# Patient Record
Sex: Female | Born: 1975 | Race: Black or African American | Smoking: Never smoker
Health system: Northeastern US, Community
[De-identification: ages and names within clinical notes are randomized; demographics above are authoritative.]

## PROBLEM LIST (undated history)

## (undated) DIAGNOSIS — J45909 Unspecified asthma, uncomplicated: Secondary | ICD-10-CM

## (undated) DIAGNOSIS — K219 Gastro-esophageal reflux disease without esophagitis: Secondary | ICD-10-CM

## (undated) DIAGNOSIS — T7840XA Allergy, unspecified, initial encounter: Secondary | ICD-10-CM

## (undated) DIAGNOSIS — R7303 Prediabetes: Secondary | ICD-10-CM

## (undated) DIAGNOSIS — I10 Essential (primary) hypertension: Secondary | ICD-10-CM

## (undated) DIAGNOSIS — I890 Lymphedema, not elsewhere classified: Secondary | ICD-10-CM

## (undated) DIAGNOSIS — F32A Depression, unspecified: Secondary | ICD-10-CM

## (undated) DIAGNOSIS — Q828 Other specified congenital malformations of skin: Secondary | ICD-10-CM

## (undated) DIAGNOSIS — F419 Anxiety disorder, unspecified: Secondary | ICD-10-CM

## (undated) DIAGNOSIS — G709 Myoneural disorder, unspecified: Secondary | ICD-10-CM

## (undated) DIAGNOSIS — M3390 Dermatopolymyositis, unspecified, organ involvement unspecified: Secondary | ICD-10-CM

## (undated) DIAGNOSIS — B029 Zoster without complications: Secondary | ICD-10-CM

## (undated) DIAGNOSIS — M3313 Other dermatomyositis without myopathy: Secondary | ICD-10-CM

## (undated) HISTORY — DX: Depression, unspecified: F32.A

## (undated) HISTORY — DX: Gastro-esophageal reflux disease without esophagitis: K21.9

## (undated) HISTORY — DX: Anxiety disorder, unspecified: F41.9

## (undated) HISTORY — DX: Allergy, unspecified, initial encounter: T78.40XA

## (undated) HISTORY — DX: Other specified congenital malformations of skin: Q82.8

## (undated) HISTORY — DX: Prediabetes: R73.03

## (undated) HISTORY — PX: WISDOM TOOTH EXTRACTION: SHX21

## (undated) HISTORY — DX: Myoneural disorder, unspecified: G70.9

## (undated) HISTORY — DX: Lymphedema, not elsewhere classified: I89.0

## (undated) HISTORY — PX: BREAST EXCISIONAL BIOPSY: SUR124

## (undated) HISTORY — PX: BREAST SURGERY: SHX581

## (undated) HISTORY — PX: BREAST LUMPECTOMY: SHX2

---

## 2010-10-10 ENCOUNTER — Inpatient Hospital Stay (HOSPITAL_BASED_OUTPATIENT_CLINIC_OR_DEPARTMENT_OTHER)
Admission: RE | Admit: 2010-10-10 | Disposition: A | Discharge status: Home / Self Care | Payer: Self-pay | Source: Emergency Department | Attending: Internal Medicine | Admitting: Internal Medicine

## 2010-10-10 ENCOUNTER — Other Ambulatory Visit (HOSPITAL_BASED_OUTPATIENT_CLINIC_OR_DEPARTMENT_OTHER): Payer: Self-pay | Admitting: Internal Medicine

## 2010-10-10 ENCOUNTER — Encounter (HOSPITAL_BASED_OUTPATIENT_CLINIC_OR_DEPARTMENT_OTHER): Payer: Self-pay

## 2010-10-10 DIAGNOSIS — M3390 Dermatopolymyositis, unspecified, organ involvement unspecified: Secondary | ICD-10-CM

## 2010-10-10 DIAGNOSIS — M3313 Other dermatomyositis without myopathy: Secondary | ICD-10-CM

## 2010-10-10 HISTORY — DX: Dermatopolymyositis, unspecified, organ involvement unspecified: M33.90

## 2010-10-10 HISTORY — DX: Zoster without complications: B02.9

## 2010-10-10 HISTORY — DX: Other dermatomyositis without myopathy: M33.13

## 2010-10-10 LAB — RBCMORPH

## 2010-10-10 LAB — C-REACTIVE PROTEIN: C-REACTIVE PROTEIN: 10.9 mg/dL (ref 0–1.8)

## 2010-10-10 LAB — CBC, PLATELET & DIFFERENTIAL
BASOPHIL %: 0.1 % (ref 0.0–2.0)
EOSINOPHIL %: 1.1 % (ref 0.0–7.0)
HEMATOCRIT: 25.9 % — ABNORMAL LOW (ref 36.0–48.0)
HEMOGLOBIN: 9 g/dl — ABNORMAL LOW (ref 12.0–16.0)
LYMPHOCYTE %: 13.5 % (ref 13.0–39.0)
MEAN CORP HGB CONC: 34.7 g/dl (ref 32.0–36.0)
MEAN CORPUSCULAR HGB: 33.9 pg — ABNORMAL HIGH (ref 27.0–33.0)
MEAN CORPUSCULAR VOL: 97.7 fl (ref 80.0–100.0)
MEAN PLATELET VOLUME: 7.4 fl (ref 6.4–10.8)
MONOCYTE %: 6.3 % (ref 1.0–12.0)
NEUTROPHIL %: 79 % (ref 46.0–79.0)
PLATELET COUNT: 328 10*3/uL (ref 150–400)
RBC DISTRIBUTION WIDTH: 13.9 % (ref 11.5–14.3)
RED BLOOD CELL COUNT: 2.65 M/uL — CL (ref 4.50–5.10)
WHITE BLOOD CELL COUNT: 6 10*3/uL (ref 4.0–10.8)

## 2010-10-10 LAB — CK INDEX: CK INDEX: 2.2 %

## 2010-10-10 LAB — COMPREHENSIVE METABOLIC PANEL
ALANINE AMINOTRANSFERASE: 41 IU/L — ABNORMAL HIGH (ref 7–35)
ALBUMIN: 3.4 g/dl (ref 3.4–4.8)
ALKALINE PHOSPHATASE: 73 IU/L (ref 25–106)
ANION GAP: 8 mmol/L (ref 2–25)
ASPARTATE AMINOTRANSFERASE: 72 IU/L — ABNORMAL HIGH (ref 8–34)
BILIRUBIN TOTAL: 0.9 mg/dl (ref 0.2–1.1)
BUN (UREA NITROGEN): 11 mg/dl (ref 6–20)
CALCIUM: 9.2 mg/dl (ref 8.6–10.3)
CARBON DIOXIDE: 29 mmol/L (ref 22–32)
CHLORIDE: 102 mmol/L (ref 101–111)
CREATININE: 0.4 mg/dl (ref 0.4–1.2)
ESTIMATED GLOMERULAR FILT RATE: >60 mL/min (ref >60)
Glucose Random: 99 mg/dl (ref 74–160)
POTASSIUM: 3.5 mmol/L (ref 3.5–5.1)
SODIUM: 139 mmol/L (ref 135–144)
TOTAL PROTEIN: 6.9 g/dl (ref 5.9–7.5)

## 2010-10-10 LAB — EMERGENCY ROOM NOTE

## 2010-10-10 LAB — CONSULTATION

## 2010-10-10 LAB — URINALYSIS
BILIRUBIN, URINE: NEGATIVE
GLUCOSE, URINE: NEGATIVE MG/DL
KETONE, URINE: NEGATIVE MG/DL
LEUKOCYTE ESTERASE: NEGATIVE
NITRITE, URINE: NEGATIVE
OCCULT BLOOD, URINE: NEGATIVE
PH URINE: 6 (ref 5.0–8.0)
SPECIFIC GRAVITY URINE: 1.025 (ref 1.003–1.035)

## 2010-10-10 LAB — CHG ASSAY OF PHOSPHORUS INORGANIC: PHOSPHORUS: 3.9 mg/dl (ref 2.7–4.7)

## 2010-10-10 LAB — CHG CREATINE KINASE TOTAL: CREATINE KINASE TOTAL: 228 IU/L (ref 21–215)

## 2010-10-10 LAB — CHG ASSAY OF MAGNESIUM: MAGNESIUM: 2.1 mg/dl (ref 1.8–2.6)

## 2010-10-10 LAB — CHG SEDIMENTATION RATE RBC NON-AUTOMATED: RBC SEDIMENTATION RATE: 1 MM/HR (ref 0–15)

## 2010-10-10 LAB — PROTHROMBIN TIME
INR: 1.1 — ABNORMAL LOW (ref 2.0–3.5)
PROTHROMBIN TIME: 11.9 SECONDS — ABNORMAL HIGH (ref 9.5–11.5)

## 2010-10-10 LAB — CREATINE, MB FRACTION: CKMB: 5.1 ng/mL (ref 0.0–4.0)

## 2010-10-10 LAB — APTT: APTT: 31.7 SECONDS (ref 23.7–33.3)

## 2010-10-10 LAB — HOLD PURPLE TOP TUBE

## 2010-10-10 LAB — WBC DIFFERENTIAL SCAN

## 2010-10-10 LAB — HOLD GREEN TOP TUBE

## 2010-10-10 MED ORDER — MULTIVITAMIN PO TAB
1.0000 | ORAL_TABLET | Freq: Every day | ORAL | Status: DC
Start: 2010-10-10 — End: 2010-10-11
  Administered 2010-10-11: 1 via ORAL
  Filled 2010-10-10: qty 1

## 2010-10-10 MED ORDER — TRAZODONE HCL 50 MG PO TABS
25.0000 mg | ORAL_TABLET | Freq: Every evening | ORAL | Status: DC | PRN
Start: 2010-10-10 — End: 2010-10-11
  Administered 2010-10-10: 25 mg via ORAL
  Filled 2010-10-10: qty 1

## 2010-10-10 MED ORDER — FOLIC ACID 1 MG PO TABS
1.0000 mg | ORAL_TABLET | Freq: Every day | ORAL | Status: DC
Start: 2010-10-10 — End: 2010-10-11
  Administered 2010-10-11: 1 mg via ORAL
  Filled 2010-10-10: qty 1

## 2010-10-10 MED ORDER — PREDNISONE 20 MG PO TABS
40.0000 mg | ORAL_TABLET | Freq: Every day | ORAL | Status: DC
Start: 2010-10-10 — End: 2010-10-11
  Administered 2010-10-10 – 2010-10-11 (×2): 40 mg via ORAL
  Filled 2010-10-10 (×2): qty 2

## 2010-10-10 MED ORDER — HYDROXYCHLOROQUINE SULFATE 200 MG PO TABS
400.0000 mg | ORAL_TABLET | Freq: Every day | ORAL | Status: DC
Start: 2010-10-10 — End: 2010-10-11
  Administered 2010-10-11: 400 mg via ORAL
  Filled 2010-10-10: qty 2

## 2010-10-10 MED ORDER — HEPARIN SODIUM (PORCINE) 5000 UNIT/ML IJ SOLN
5000.0000 [IU] | Freq: Three times a day (TID) | INTRAMUSCULAR | Status: DC
Start: 2010-10-10 — End: 2010-10-11
  Administered 2010-10-10 – 2010-10-11 (×2): 5000 [IU] via SUBCUTANEOUS
  Filled 2010-10-10 (×3): qty 1

## 2010-10-10 MED ORDER — ACETAMINOPHEN 325 MG PO TABS
650.0000 mg | ORAL_TABLET | ORAL | Status: DC | PRN
Start: 2010-10-10 — End: 2010-10-11
  Administered 2010-10-11: 650 mg via ORAL
  Filled 2010-10-10: qty 2

## 2010-10-10 NOTE — ED Notes (Signed)
Fever since last Thursday up to 103, itchy bumps on the skin intermittent.

## 2010-10-11 LAB — CBC WITH PLATELET
HEMATOCRIT: 25.9 % — ABNORMAL LOW (ref 36.0–48.0)
HEMOGLOBIN: 8.6 g/dl — ABNORMAL LOW (ref 12.0–16.0)
MEAN CORP HGB CONC: 33 g/dl (ref 32.0–36.0)
MEAN CORPUSCULAR HGB: 32.7 pg (ref 27.0–33.0)
MEAN CORPUSCULAR VOL: 98.9 fl (ref 80.0–100.0)
MEAN PLATELET VOLUME: 7.7 fl (ref 6.4–10.8)
PLATELET COUNT: 313 10*3/uL (ref 150–400)
RBC DISTRIBUTION WIDTH: 13.8 % (ref 11.5–14.3)
RED BLOOD CELL COUNT: 2.62 M/uL — CL (ref 4.50–5.10)
WHITE BLOOD CELL COUNT: 6.8 10*3/uL (ref 4.0–10.8)

## 2010-10-11 LAB — BASIC METABOLIC PANEL
ANION GAP: 7 mmol/L (ref 2–25)
BUN (UREA NITROGEN): 6 mg/dl (ref 6–20)
CALCIUM: 8.6 mg/dl (ref 8.6–10.3)
CARBON DIOXIDE: 27 mmol/L (ref 22–32)
CHLORIDE: 105 mmol/L (ref 101–111)
CREATININE: 0.4 mg/dl (ref 0.4–1.2)
ESTIMATED GLOMERULAR FILT RATE: >60 mL/min (ref >60)
Glucose Random: 127 mg/dl (ref 74–160)
POTASSIUM: 4.2 mmol/L (ref 3.5–5.1)
SODIUM: 139 mmol/L (ref 135–144)

## 2010-10-11 LAB — CHG ASSAY OF PHOSPHORUS INORGANIC: PHOSPHORUS: 3.3 mg/dl (ref 2.7–4.7)

## 2010-10-11 LAB — CHG ASSAY OF MAGNESIUM: MAGNESIUM: 2.2 mg/dl (ref 1.8–2.6)

## 2010-10-11 LAB — XR CHEST 2 VIEWS

## 2010-10-15 LAB — CHG RHEUMATOID FACTOR QUALITATIVE: RHEUMATOID FACTOR: NEGATIVE

## 2010-10-15 LAB — VITAMIN B12: VITAMIN B12: 817 pg/mL (ref 180–914)

## 2010-10-15 LAB — VITAMIN D,25 HYDROXY: VITAMIN D,25 HYDROXY: 31.8 ng/ml (ref 30.0–100.0)

## 2010-10-15 LAB — CHG ASSAY OF FOLIC ACID SERUM: FOLATE: >20 ng/mL (ref 3.0–?)

## 2010-10-16 ENCOUNTER — Ambulatory Visit (HOSPITAL_BASED_OUTPATIENT_CLINIC_OR_DEPARTMENT_OTHER): Payer: PRIVATE HEALTH INSURANCE | Admitting: Internal Medicine

## 2010-10-16 VITALS — BP 133/85 | HR 71 | Ht 68.5 in | Wt 160.0 lb

## 2010-10-16 DIAGNOSIS — M3313 Other dermatomyositis without myopathy: Secondary | ICD-10-CM

## 2010-10-16 DIAGNOSIS — L0390 Cellulitis, unspecified: Secondary | ICD-10-CM | POA: Insufficient documentation

## 2010-10-16 DIAGNOSIS — R809 Proteinuria, unspecified: Secondary | ICD-10-CM | POA: Insufficient documentation

## 2010-10-16 DIAGNOSIS — M349 Systemic sclerosis, unspecified: Secondary | ICD-10-CM

## 2010-10-16 DIAGNOSIS — M3390 Dermatopolymyositis, unspecified, organ involvement unspecified: Secondary | ICD-10-CM | POA: Insufficient documentation

## 2010-10-16 DIAGNOSIS — B029 Zoster without complications: Secondary | ICD-10-CM | POA: Insufficient documentation

## 2010-10-16 DIAGNOSIS — N63 Unspecified lump in unspecified breast: Secondary | ICD-10-CM | POA: Insufficient documentation

## 2010-10-16 DIAGNOSIS — J302 Other seasonal allergic rhinitis: Secondary | ICD-10-CM | POA: Insufficient documentation

## 2010-10-16 LAB — LUPUS ANTICOAGULANT PANEL
DILUTE RUSSELL'S VIPER VENOM: 44 s (ref 0.0–50.1)
PTT-LA LUPUS ANTICOAGUL SCREEN: 43.5 s (ref 0.0–50.0)

## 2010-10-16 LAB — ANTICARDIOLIPIN ANTIBODIES
ANTICARDIOLIPIN ANTIBODY IGG: <9 GPL U/mL (ref 0–14)
ANTICARDIOLIPIN ANTIBODY IGM: <9 MPL U/mL (ref 0–12)

## 2010-10-16 LAB — C4 COMPLEMENT: C4 COMPLEMENT: 44 — AB (ref 9–36)

## 2010-10-16 LAB — BLOOD CULTURE ADULT
AEROBIC BOTTLE, BLOOD CULTURE: NO GROWTH
AEROBIC BOTTLE, BLOOD CULTURE: NO GROWTH
ANAEROBIC BTL, BLOOD CULTURE: NO GROWTH
ANAEROBIC BTL, BLOOD CULTURE: NO GROWTH

## 2010-10-16 LAB — CH 50 COMPLEMENT TOTAL: CH 50 COMPLEMENT TOTAL: >60 U/mL — AB (ref 22–60)

## 2010-10-16 LAB — ANTI-DNA AB DOUBLE STRAND: ANTI-DNA AB DOUBLE STRAND: <1 IU/mL (ref 0–9)

## 2010-10-16 LAB — C3 COMPLEMENT: C3 COMPLEMENT: 184 — AB (ref 90–180)

## 2010-10-16 LAB — BETA-2 GLYCOPROTEIN ABS
BETA 2 GLYCOPROTEIN I AB IGA: <9 (ref 0–20)
BETA 2 GLYCOPROTEIN I AB IGG: <9 (ref 0–20)
BETA 2 GLYCOPROTEIN I AB IGM: <9 (ref 0–20)

## 2010-10-16 LAB — SJOGREN'S ANTIBODY (SS-A,SS-B)
SS-A (RO): <0.2 AI (ref 0.0–0.9)
SS-B (LA): <0.2 AI (ref 0.0–0.9)

## 2010-10-16 LAB — ANTI EXTRACTABLE NUCLEAR AB
ANTI-RNP: 0.4 AI (ref 0.0–0.9)
ANTI-SM: <0.2 AI (ref 0.0–0.9)

## 2010-10-16 LAB — CCP ANTIBODIES IGG/IGA: CCP ANTIBODIES IGG/IGA: 3 units (ref 0–19)

## 2010-10-16 MED ORDER — MYCOPHENOLATE MOFETIL 500 MG PO TABS
1000.0000 mg | ORAL_TABLET | Freq: Two times a day (BID) | ORAL | Status: DC
Start: 2010-10-16 — End: 2010-12-23

## 2010-10-16 MED ORDER — FOLIC ACID 1 MG PO TABS
1.0000 mg | ORAL_TABLET | Freq: Every day | ORAL | Status: DC
Start: 2010-10-16 — End: 2011-05-04

## 2010-10-16 MED ORDER — MUPIROCIN 2 % EX OINT
TOPICAL_OINTMENT | Freq: Three times a day (TID) | CUTANEOUS | Status: AC
Start: 2010-10-16 — End: 2011-10-16

## 2010-10-16 MED ORDER — CALCIUM CARBONATE-VITAMIN D 600-400 MG-UNIT PO TABS
1.00 | ORAL_TABLET | Freq: Two times a day (BID) | ORAL | Status: AC
Start: 2010-10-16 — End: 2011-10-16

## 2010-10-16 MED ORDER — VITAMIN D 1000 UNITS PO CAPS
1.00 | ORAL_CAPSULE | Freq: Every day | ORAL | Status: AC
Start: 2010-10-16 — End: 2011-10-16

## 2010-10-16 MED ORDER — HYDROXYCHLOROQUINE SULFATE 200 MG PO TABS
400.0000 mg | ORAL_TABLET | Freq: Every day | ORAL | Status: AC
Start: 2010-10-16 — End: 2011-10-16

## 2010-10-16 MED ORDER — MYCOPHENOLATE MOFETIL 500 MG PO TABS
1000.0000 mg | ORAL_TABLET | Freq: Two times a day (BID) | ORAL | Status: DC
Start: 2010-10-16 — End: 2010-10-16

## 2010-10-16 MED ORDER — METHOTREXATE 2.5 MG PO TABS
25.0000 mg | ORAL_TABLET | ORAL | Status: DC
Start: 2010-10-16 — End: 2011-03-16

## 2010-10-16 MED ORDER — ONE-DAILY MULTI VITAMINS PO TABS
1.00 | ORAL_TABLET | Freq: Every day | ORAL | Status: AC
Start: 2010-10-16 — End: 2011-10-16

## 2010-10-16 MED ORDER — PREDNISONE 10 MG PO TABS
40.0000 mg | ORAL_TABLET | Freq: Every day | ORAL | Status: DC
Start: 2010-10-16 — End: 2010-12-23

## 2010-10-16 NOTE — Patient Instructions (Addendum)
Stay on 40mg  2 more weeks.  Then decrease to 30mg .  30mg  for 1 month.  Then try to decrease to 20mg .      Increase the Cellcept/mycophenolate to 3 tablets a day for 1 week then to 2 tablets twice a day- start on 11/06/10.

## 2010-10-16 NOTE — Progress Notes (Signed)
The primary progress note for this visit has been dictated through E-Scription. It can be viewed as an attachment to this encounter or through Chart Review under the Other Tab as an Rheumatology Office Note.      Review of Systems: Constitutional, Eyes, ENT/Mouth, Cardiovascular, Respiratory, GI, GU, Neuro, Psych, Heme/Lymph, Skin, Musculoskeletal was reviewed and is NEGATIVE except for what is dictated in the note.

## 2010-10-23 LAB — BLOOD CULTURE ADULT
AEROBIC BOTTLE, BLOOD CULTURE: NO GROWTH
ANAEROBIC BTL, BLOOD CULTURE: NO GROWTH

## 2010-10-28 ENCOUNTER — Telehealth (HOSPITAL_BASED_OUTPATIENT_CLINIC_OR_DEPARTMENT_OTHER): Payer: Self-pay

## 2010-10-28 NOTE — Telephone Encounter (Signed)
Spoke to pt she is not going to increase cellcept until end of month when she can come in for labs.  Pt is on 40mg  of prednisone if not better by Monday to call us to discuss taper

## 2010-10-28 NOTE — Telephone Encounter (Signed)
Message copied by Kenyon Ana ANN on Tue Oct 28, 2010  1:26 PM  ------       Message from: Cecilio Asper       Created: Tue Oct 28, 2010 12:42 PM         Can stay on prednisone but still increase cellcept.       ----- Message -----          From: Norman Herrlich          Sent: 10/28/2010  11:39 AM            To: Lucretia Roers              Patient called today.  She is suppose to start decreasing the prednisone, however, the itchy bumps are back but not the fever.  She would like to stay on the same does for a couple of weeks before she starts decreasing the dose.  Also she is suppose to increase the Cellcept at the end of the month, if she does not lower the prednisone would that change the increase of Cellcept?              She can be reached at [phone].   kw

## 2010-10-29 LAB — RHEUMATOLOGY OFFICE NOTE

## 2010-11-03 ENCOUNTER — Telehealth (HOSPITAL_BASED_OUTPATIENT_CLINIC_OR_DEPARTMENT_OTHER): Payer: Self-pay

## 2010-11-03 NOTE — Telephone Encounter (Signed)
Called pt back.   Per pt has had no fever since on prednisone.  Pt is on 40mg  of prednisone bumps come and go but has not had new ones.  Pt is going to decrease to 30mg  for a few weeks and if is ok and fever free will try to decrease to 20mg .   Pt has cellcept increasing taper at home and will follow Dr. Leodis Liverpool instructions and will get labs done by PCP in Feb.

## 2010-11-05 ENCOUNTER — Ambulatory Visit (HOSPITAL_BASED_OUTPATIENT_CLINIC_OR_DEPARTMENT_OTHER): Payer: Self-pay | Admitting: Internal Medicine

## 2010-11-05 DIAGNOSIS — M349 Systemic sclerosis, unspecified: Secondary | ICD-10-CM

## 2010-11-07 LAB — DISCHARGE SUMMARY

## 2010-11-10 ENCOUNTER — Encounter (HOSPITAL_BASED_OUTPATIENT_CLINIC_OR_DEPARTMENT_OTHER): Payer: Self-pay | Admitting: Internal Medicine

## 2010-11-27 ENCOUNTER — Encounter (HOSPITAL_BASED_OUTPATIENT_CLINIC_OR_DEPARTMENT_OTHER): Payer: Self-pay | Admitting: Internal Medicine

## 2010-11-27 ENCOUNTER — Other Ambulatory Visit: Payer: Self-pay | Admitting: Internal Medicine

## 2010-11-27 ENCOUNTER — Ambulatory Visit (HOSPITAL_BASED_OUTPATIENT_CLINIC_OR_DEPARTMENT_OTHER): Payer: PRIVATE HEALTH INSURANCE | Admitting: Internal Medicine

## 2010-11-27 VITALS — BP 116/64 | Temp 97.9°F | Ht 68.5 in | Wt 151.2 lb

## 2010-11-27 DIAGNOSIS — M3390 Dermatopolymyositis, unspecified, organ involvement unspecified: Secondary | ICD-10-CM

## 2010-11-27 DIAGNOSIS — R634 Abnormal weight loss: Secondary | ICD-10-CM

## 2010-11-27 DIAGNOSIS — M3313 Other dermatomyositis without myopathy: Secondary | ICD-10-CM

## 2010-11-27 DIAGNOSIS — N63 Unspecified lump in unspecified breast: Secondary | ICD-10-CM

## 2010-11-27 DIAGNOSIS — Z23 Encounter for immunization: Secondary | ICD-10-CM

## 2010-11-27 LAB — CBC, PLATELET & DIFFERENTIAL
BASOPHIL %: 0 % (ref 0.0–2.0)
EOSINOPHIL %: 0.1 % (ref 0.0–7.0)
HEMATOCRIT: 35.5 % — ABNORMAL LOW (ref 36.0–48.0)
HEMOGLOBIN: 12.1 g/dl (ref 12.0–16.0)
LYMPHOCYTE %: 5.3 % — ABNORMAL LOW (ref 13.0–39.0)
MEAN CORP HGB CONC: 34.1 g/dl (ref 32.0–36.0)
MEAN CORPUSCULAR HGB: 33.1 pg — ABNORMAL HIGH (ref 27.0–33.0)
MEAN CORPUSCULAR VOL: 97.1 fl (ref 80.0–100.0)
MEAN PLATELET VOLUME: 8.2 fl (ref 6.4–10.8)
MONOCYTE %: 2.9 % (ref 1.0–12.0)
NEUTROPHIL %: 91.7 % (ref 46.0–79.0)
PLATELET COUNT: 268 10*3/uL (ref 150–400)
RBC DISTRIBUTION WIDTH: 15.4 % — ABNORMAL HIGH (ref 11.5–14.3)
RED BLOOD CELL COUNT: 3.66 M/uL — ABNORMAL LOW (ref 4.50–5.10)
WHITE BLOOD CELL COUNT: 7.4 10*3/uL (ref 4.0–10.8)

## 2010-11-27 LAB — COMPREHENSIVE METABOLIC PANEL
ALANINE AMINOTRANSFERASE: 20 IU/L (ref 7–35)
ALBUMIN: 4 g/dl (ref 3.4–4.8)
ALKALINE PHOSPHATASE: 30 IU/L (ref 25–106)
ANION GAP: 9 mmol/L (ref 2–25)
ASPARTATE AMINOTRANSFERASE: 26 IU/L (ref 8–34)
BILIRUBIN TOTAL: 0.7 mg/dl (ref 0.2–1.1)
BUN (UREA NITROGEN): 6 mg/dl (ref 6–20)
CALCIUM: 9.3 mg/dl (ref 8.6–10.3)
CARBON DIOXIDE: 26 mmol/L (ref 22–32)
CHLORIDE: 103 mmol/L (ref 101–111)
CREATININE: 0.5 mg/dl (ref 0.4–1.2)
ESTIMATED GLOMERULAR FILT RATE: >60 mL/min (ref >60)
Glucose Random: 109 mg/dl (ref 74–160)
POTASSIUM: 4.3 mmol/L (ref 3.5–5.1)
SODIUM: 138 mmol/L (ref 135–144)
TOTAL PROTEIN: 7.3 g/dl (ref 5.9–7.5)

## 2010-11-27 LAB — WBC DIFFERENTIAL SCAN

## 2010-11-27 NOTE — Progress Notes (Signed)
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 Getting care in rheum clinic. Recent flair and rx'd with prednisone. Still on 30 and undergoing slow taper  O:BP 116/64  Temp(Src) 97.9 F (36.6 C) (Oral)  Ht 5' 8.5" (1.74 m)  Wt 151 lb 3.2 oz (68.584 kg)  BMI 22.66 kg/m2  Not cushingoid  tightening of skin   no hair,  vitiligo  CV Click on exam  A/P: dermatomyositis: f/u at rheum   will investigate thing that need to be looking into over time  H/o l breast mass inflammatory path  Please see residents note for further details.

## 2010-11-27 NOTE — Progress Notes (Signed)
S: Julie Freeman is a 35 year old female with a history of Dermatomyositis. 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 Has more flexible hours now to attend medical appointments, trying to find work through [facility].     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 Lenor Derrick). ?Vitiligo, discoid lupus. Has established care with Dr. Lenor Derrick. Reports overall feeling much better since starting prednisone after hosp admission around new year's. Now tapered to 30mg  from 40mg  initially. Denies recurrent fevers.    Med list reviewed, reports she is taking all of them as prescribed.     Comes today with a letter from [facility] to be filled out in hopes that she can receive employment help on the basis of her disability. Feels as though she had experienced some discrimination in the work place because she cannot work as quickly as counterparts. Really would like to work. Does better with sedentary work.   Has lost 9 pounds since Jan, believes 2/2 to a host of stressors, change in eating habits.     Has 31 year old son, pervasive developmental disorder. Son socialable but "learns differently" DM type 1, peanut allergy.     Reports that she has an upcoming appt for PFT's. Enocuraged to call to be sure as I cannot see evidence in EPic.    O: BP 116/64  Temp(Src) 97.9 F (36.6 C) (Oral)  Ht 5' 8.5" (1.74 m)  Wt 151 lb 3.2 oz (68.584 kg)  BMI 22.66 kg/m2          Physical Exam   Constitutional: Vital signs are normal.   Cardiovascular: Normal rate, regular rhythm and normal heart sounds.         ?end-systolic click/s3   Pulmonary/Chest: Effort normal and breath sounds normal. No respiratory distress. She has no wheezes. Right breast exhibits no inverted nipple, no mass and no nipple discharge. Left breast exhibits no inverted nipple, no mass and no nipple discharge. Breasts are symmetrical.        Well-healed, inch-long incision over left lateral  breast.    Abdominal: Soft. She exhibits no distension. No tenderness.   Lymphadenopathy:        Right axillary: No pectoral and no lateral adenopathy present.        Left axillary: No pectoral and no lateral adenopathy present.  Skin: She is not diaphoretic.        Discoloration/depigmentation of skin over face, chest, hands. Periorbital discoloration of skin b/l.      A/P:  710.3 Dermatomyositis  (primary encounter diagnosis)  Comment: Sent off new another request for medical records from [place] [facility]. As per Dr Hedwig Morton notes, needs surveillance ECHO, Chest CT, PFT's. Patient reports she got ECHO recently in [place]. As per pt, PFT appt next week.   Plan: To see Rheum in 3 weeks. Await records, will order above surveillance tests as needed. Will complete [facility] Paperwork     611.29F Breast mass  Comment: Lupectomy 2007. path from [facility] records provided from patient today, no evidence of malignancy. Patient unclear about what mammo interval she should observe ?role for ultrasound, other imaging modalities given <1 years of age? Exam benign.   Plan: F/U [place] records, review scanned records for recs. Consider breast center referral.     V04.81 Flu Vaccine Given  Plan: IMMUNIZATION ADMIN SINGLE, RN, INFLUENZA VIRUS         VACCINE SPLIT VIRUS 3/> YRS IM  783.21Q Weight loss  Comment: 9 lbs in 6 weeks. Pt believes related to stress, change in eating habits, reports she has had gross fluctuations in weight over many years  Plan: continue to follow, consider occult malignancy vs. Rheum disease vs. Volatile social/nutrional status.

## 2010-11-27 NOTE — Progress Notes (Signed)
Patient received Flu vax shot into left deltoid. See immunization/Injection module or chart review for date of publication and additional information.  Pt. tolerated shot well. VIS sheet given to pt. Pt has no known allergies that would prohibit receiving this immunization.

## 2010-11-27 NOTE — Progress Notes (Signed)
Body mass index is 22.66 kg/(m^2).    Patient stated that she feels safe at home.

## 2010-12-02 ENCOUNTER — Encounter (HOSPITAL_BASED_OUTPATIENT_CLINIC_OR_DEPARTMENT_OTHER): Payer: Self-pay | Admitting: Internal Medicine

## 2010-12-02 ENCOUNTER — Telehealth (HOSPITAL_BASED_OUTPATIENT_CLINIC_OR_DEPARTMENT_OTHER): Payer: Self-pay | Admitting: Internal Medicine

## 2010-12-02 NOTE — Telephone Encounter (Signed)
Called pt to let her know that I was looking at [facility] form for her but feel it would be most appropriately completed by Dr. Lenor Derrick as several of the questions regarding prognosis and treatment plan for her dermatomyositis required detailed responses and understanding of her disease process. She confirms that she does not need the form for approx one month and she is happy to have the form passed on.

## 2010-12-11 LAB — PULMONARY FUNCTION TEST

## 2010-12-18 ENCOUNTER — Ambulatory Visit (HOSPITAL_BASED_OUTPATIENT_CLINIC_OR_DEPARTMENT_OTHER): Payer: PRIVATE HEALTH INSURANCE | Admitting: Internal Medicine

## 2010-12-23 ENCOUNTER — Ambulatory Visit (HOSPITAL_BASED_OUTPATIENT_CLINIC_OR_DEPARTMENT_OTHER): Payer: PRIVATE HEALTH INSURANCE | Admitting: Internal Medicine

## 2010-12-23 VITALS — BP 122/69 | HR 78 | Wt 153.0 lb

## 2010-12-23 DIAGNOSIS — M3390 Dermatopolymyositis, unspecified, organ involvement unspecified: Secondary | ICD-10-CM

## 2010-12-23 DIAGNOSIS — Z79899 Other long term (current) drug therapy: Secondary | ICD-10-CM

## 2010-12-23 DIAGNOSIS — M3313 Other dermatomyositis without myopathy: Secondary | ICD-10-CM

## 2010-12-23 DIAGNOSIS — M349 Systemic sclerosis, unspecified: Secondary | ICD-10-CM

## 2010-12-23 LAB — CBC, PLATELET & DIFFERENTIAL
BASOPHIL %: 0 % (ref 0.0–2.0)
EOSINOPHIL %: 0.1 % (ref 0.0–7.0)
HEMATOCRIT: 35 % — ABNORMAL LOW (ref 36.0–48.0)
HEMOGLOBIN: 11.9 g/dl — ABNORMAL LOW (ref 12.0–16.0)
LYMPHOCYTE %: 5.8 % — ABNORMAL LOW (ref 13.0–39.0)
MEAN CORP HGB CONC: 34.1 g/dl (ref 32.0–36.0)
MEAN CORPUSCULAR HGB: 33.8 pg — ABNORMAL HIGH (ref 27.0–33.0)
MEAN CORPUSCULAR VOL: 99.3 fl (ref 80.0–100.0)
MEAN PLATELET VOLUME: 8.5 fl (ref 6.4–10.8)
MONOCYTE %: 4.5 % (ref 1.0–12.0)
NEUTROPHIL %: 89.6 % — ABNORMAL HIGH (ref 46.0–79.0)
PLATELET COUNT: 225 10*3/uL (ref 150–400)
RBC DISTRIBUTION WIDTH: 15.9 % — ABNORMAL HIGH (ref 11.5–14.3)
RED BLOOD CELL COUNT: 3.53 M/uL — ABNORMAL LOW (ref 4.50–5.10)
WHITE BLOOD CELL COUNT: 9.8 10*3/uL (ref 4.0–10.8)

## 2010-12-23 LAB — WBC DIFFERENTIAL SCAN

## 2010-12-23 MED ORDER — PREDNISONE 2.5 MG PO TABS
2.5000 mg | ORAL_TABLET | Freq: Every day | ORAL | Status: DC
Start: 2010-12-23 — End: 2011-11-10

## 2010-12-23 MED ORDER — MYCOPHENOLATE MOFETIL 500 MG PO TABS
1500.0000 mg | ORAL_TABLET | Freq: Two times a day (BID) | ORAL | Status: DC
Start: 2010-12-23 — End: 2011-05-04

## 2010-12-23 MED ORDER — PREDNISONE 5 MG PO TABS
15.0000 mg | ORAL_TABLET | Freq: Every day | ORAL | Status: DC
Start: 2010-12-23 — End: 2011-05-04

## 2010-12-23 NOTE — Progress Notes (Signed)
The primary progress note for this visit has been dictated through E-Scription. It can be viewed as an attachment to this encounter or through Chart Review under the Other Tab as an Rheumatology Office Note.      Review of Systems: Constitutional, Eyes, ENT/Mouth, Cardiovascular, Respiratory, GI, GU, Neuro, Psych, Heme/Lymph, Skin, Musculoskeletal was reviewed and is NEGATIVE except for what is dictated in the note.

## 2010-12-23 NOTE — Patient Instructions (Signed)
Decrease prednisone to 17.5mg  for 2 weeks.    Then decrease to 15mg  for 2 weeks.    Then decrease to 12.5mg  for 2 weeks.    Then 10mg  and stay there.    Cellcept- increase to 5 pills a day for one week.  Then increase to 6 pills a day.    Blood test in 3-4 weeks.      I'll see you in 8 weeks.    If the painful nodules come back, let me know.

## 2010-12-24 LAB — RHEUMATOLOGY OFFICE NOTE

## 2011-01-15 ENCOUNTER — Ambulatory Visit (HOSPITAL_BASED_OUTPATIENT_CLINIC_OR_DEPARTMENT_OTHER): Payer: MEDICAID | Admitting: Ophthalmology

## 2011-01-15 DIAGNOSIS — H5210 Myopia, unspecified eye: Secondary | ICD-10-CM

## 2011-01-15 DIAGNOSIS — Z09 Encounter for follow-up examination after completed treatment for conditions other than malignant neoplasm: Secondary | ICD-10-CM

## 2011-01-15 DIAGNOSIS — M3390 Dermatopolymyositis, unspecified, organ involvement unspecified: Secondary | ICD-10-CM

## 2011-01-15 DIAGNOSIS — M3313 Other dermatomyositis without myopathy: Secondary | ICD-10-CM

## 2011-01-15 NOTE — Progress Notes (Signed)
.    Julie Freeman was seen at [facility].  She takes plaquenil for dermatomyositis.  On exam there were no signs of macular toxicity from plaquenil.    She will rtc for a visual field test in 3-6 months and rtc in 1 year.  She was instructed to rtc immediately if she notices any changes in her vision.

## 2011-01-15 NOTE — Nursing Note (Signed)
>>   Julie Freeman,OT     Thu Jan 15, 2011  1:46 PM  Pt with autoimmune disease taking plaquenil here for exam. [facility] is pretty good with glasses OD worse than OS.

## 2011-01-21 ENCOUNTER — Encounter (HOSPITAL_BASED_OUTPATIENT_CLINIC_OR_DEPARTMENT_OTHER): Payer: Self-pay | Admitting: Internal Medicine

## 2011-02-23 ENCOUNTER — Ambulatory Visit (HOSPITAL_BASED_OUTPATIENT_CLINIC_OR_DEPARTMENT_OTHER): Payer: PRIVATE HEALTH INSURANCE | Admitting: Internal Medicine

## 2011-03-16 ENCOUNTER — Other Ambulatory Visit (HOSPITAL_BASED_OUTPATIENT_CLINIC_OR_DEPARTMENT_OTHER): Payer: Self-pay

## 2011-03-16 MED ORDER — METHOTREXATE 2.5 MG PO TABS
25.0000 mg | ORAL_TABLET | ORAL | Status: DC
Start: 2011-03-16 — End: 2011-05-04

## 2011-03-16 NOTE — Telephone Encounter (Signed)
Pt now has Ins aware needs followup

## 2011-03-23 ENCOUNTER — Ambulatory Visit: Payer: Self-pay | Admitting: Internal Medicine

## 2011-03-23 DIAGNOSIS — M3390 Dermatopolymyositis, unspecified, organ involvement unspecified: Secondary | ICD-10-CM

## 2011-03-23 DIAGNOSIS — M3313 Other dermatomyositis without myopathy: Secondary | ICD-10-CM

## 2011-03-23 LAB — CBC, PLATELET & DIFFERENTIAL
BASOPHIL %: 0.4 % (ref 0.0–2.0)
EOSINOPHIL %: 0.7 % (ref 0.0–7.0)
HEMATOCRIT: 33.8 % — ABNORMAL LOW (ref 36.0–48.0)
HEMOGLOBIN: 11.8 g/dl — ABNORMAL LOW (ref 12.0–16.0)
LYMPHOCYTE %: 10.9 % — ABNORMAL LOW (ref 13.0–39.0)
MEAN CORP HGB CONC: 35 g/dl (ref 32.0–36.0)
MEAN CORPUSCULAR HGB: 35.1 pg — ABNORMAL HIGH (ref 27.0–33.0)
MEAN CORPUSCULAR VOL: 100.3 fl — ABNORMAL HIGH (ref 80.0–100.0)
MEAN PLATELET VOLUME: 8.3 fl (ref 6.4–10.8)
MONOCYTE %: 12.6 % — ABNORMAL HIGH (ref 1.0–12.0)
NEUTROPHIL %: 75.4 % (ref 46.0–79.0)
PLATELET COUNT: 215 10*3/uL (ref 150–400)
RBC DISTRIBUTION WIDTH: 12.7 % (ref 11.5–14.3)
RED BLOOD CELL COUNT: 3.37 M/uL — ABNORMAL LOW (ref 4.50–5.10)
WHITE BLOOD CELL COUNT: 4.9 10*3/uL (ref 4.0–10.8)

## 2011-03-23 LAB — WBC DIFFERENTIAL SCAN

## 2011-04-01 ENCOUNTER — Encounter (HOSPITAL_BASED_OUTPATIENT_CLINIC_OR_DEPARTMENT_OTHER): Payer: Self-pay | Admitting: Internal Medicine

## 2011-04-03 ENCOUNTER — Ambulatory Visit (HOSPITAL_BASED_OUTPATIENT_CLINIC_OR_DEPARTMENT_OTHER): Payer: MEDICAID | Admitting: Internal Medicine

## 2011-04-08 ENCOUNTER — Telehealth (HOSPITAL_BASED_OUTPATIENT_CLINIC_OR_DEPARTMENT_OTHER): Payer: Self-pay

## 2011-04-08 NOTE — Telephone Encounter (Signed)
Message copied by Kenyon Ana ANN on Wed Apr 08, 2011  4:44 PM  ------       Message from: Cecilio Asper       Created: Wed Apr 08, 2011  4:02 PM         I need to know what is special about this [employer] pass?       ----- Message -----          From: Durene Fruits          Sent: 04/08/2011   3:05 PM            To: Cecilio Asper              Pt wants letter stating that she would benefit from special [employer] pass

## 2011-04-08 NOTE — Telephone Encounter (Signed)
Spoke to pt she spoke to [employer] aware needs to get form to fill out.  When she gets form will bring in for [place] to fill out our part

## 2011-04-14 ENCOUNTER — Ambulatory Visit (HOSPITAL_BASED_OUTPATIENT_CLINIC_OR_DEPARTMENT_OTHER): Payer: MEDICAID | Admitting: Internal Medicine

## 2011-05-04 ENCOUNTER — Telehealth (HOSPITAL_BASED_OUTPATIENT_CLINIC_OR_DEPARTMENT_OTHER): Payer: Self-pay

## 2011-05-04 ENCOUNTER — Ambulatory Visit (HOSPITAL_BASED_OUTPATIENT_CLINIC_OR_DEPARTMENT_OTHER): Payer: PRIVATE HEALTH INSURANCE | Admitting: Internal Medicine

## 2011-05-04 VITALS — BP 118/70 | HR 75 | Wt 157.5 lb

## 2011-05-04 DIAGNOSIS — M349 Systemic sclerosis, unspecified: Secondary | ICD-10-CM

## 2011-05-04 DIAGNOSIS — L932 Other local lupus erythematosus: Secondary | ICD-10-CM

## 2011-05-04 DIAGNOSIS — M3313 Other dermatomyositis without myopathy: Secondary | ICD-10-CM

## 2011-05-04 DIAGNOSIS — M3390 Dermatopolymyositis, unspecified, organ involvement unspecified: Secondary | ICD-10-CM

## 2011-05-04 LAB — CBC WITH PLATELET
HEMATOCRIT: 35.8 % — ABNORMAL LOW (ref 36.0–48.0)
HEMOGLOBIN: 11.8 g/dl — ABNORMAL LOW (ref 12.0–16.0)
MEAN CORP HGB CONC: 32.9 g/dl (ref 32.0–36.0)
MEAN CORPUSCULAR HGB: 33.7 pg — ABNORMAL HIGH (ref 27.0–33.0)
MEAN CORPUSCULAR VOL: 102.3 fl — ABNORMAL HIGH (ref 80.0–100.0)
MEAN PLATELET VOLUME: 8.1 fl (ref 6.4–10.8)
PLATELET COUNT: 236 10*3/uL (ref 150–400)
RBC DISTRIBUTION WIDTH: 12.6 % (ref 11.5–14.3)
RED BLOOD CELL COUNT: 3.5 M/uL — ABNORMAL LOW (ref 4.50–5.10)
WHITE BLOOD CELL COUNT: 5 10*3/uL (ref 4.0–10.8)

## 2011-05-04 LAB — CHG SEDIMENTATION RATE RBC NON-AUTOMATED: RBC SEDIMENTATION RATE: 10 MM/HR (ref 0–15)

## 2011-05-04 MED ORDER — PREDNISONE 5 MG PO TABS
5.0000 mg | ORAL_TABLET | Freq: Every day | ORAL | Status: DC
Start: 2011-05-04 — End: 2011-09-08

## 2011-05-04 MED ORDER — FOLIC ACID 1 MG PO TABS
2.0000 mg | ORAL_TABLET | Freq: Every day | ORAL | Status: AC
Start: 2011-05-04 — End: 2012-05-03

## 2011-05-04 MED ORDER — CLOBETASOL PROPIONATE 0.05 % EX SOLN
Freq: Two times a day (BID) | CUTANEOUS | Status: DC
Start: 2011-05-04 — End: 2011-11-06

## 2011-05-04 MED ORDER — MYCOPHENOLATE MOFETIL 500 MG PO TABS
1500.0000 mg | ORAL_TABLET | Freq: Two times a day (BID) | ORAL | Status: DC
Start: 2011-05-04 — End: 2011-09-08

## 2011-05-04 MED ORDER — METHOTREXATE 2.5 MG PO TABS
25.0000 mg | ORAL_TABLET | ORAL | Status: DC
Start: 2011-05-04 — End: 2011-07-13

## 2011-05-04 NOTE — Telephone Encounter (Signed)
Pt called needed refill methotrexate 2.5mg  10 tabs weekly, mycophenolate 500 mg 3 tabs bid, and prednisone pt presently taking 5mg  daily.  Pt aware needs labs and follow-up appointment.  Pt to come in today and see Dr. Lenor Derrick get labs and script  3:20pm.  Pt aware and agreeable to plan. Pt aware very important she do follow-up.

## 2011-05-04 NOTE — Progress Notes (Signed)
The primary progress note for this visit has been dictated through E-Scription. It can be viewed as an attachment to this encounter or through Chart Review under the Other Tab as an Rheumatology Office Note.      Review of Systems: Constitutional, Eyes, ENT/Mouth, Cardiovascular, Respiratory, GI, GU, Neuro, Psych, Heme/Lymph, Skin, Musculoskeletal was reviewed and is NEGATIVE except for what is dictated in the note.

## 2011-05-04 NOTE — Patient Instructions (Addendum)
Eucerin- calming cream    Aveeno    Sarna      Scalp steroid solution    Selsun blue, tar shampoo- neutrogena

## 2011-05-05 ENCOUNTER — Encounter (HOSPITAL_BASED_OUTPATIENT_CLINIC_OR_DEPARTMENT_OTHER): Payer: Self-pay | Admitting: Internal Medicine

## 2011-05-05 LAB — COMPREHENSIVE METABOLIC PANEL
ALANINE AMINOTRANSFERASE: 15 IU/L (ref 7–35)
ALBUMIN: 4.3 g/dl (ref 3.4–4.8)
ALKALINE PHOSPHATASE: 29 IU/L (ref 25–106)
ANION GAP: 3 mmol/L (ref 2–25)
ASPARTATE AMINOTRANSFERASE: 24 IU/L (ref 8–34)
BILIRUBIN TOTAL: 1 mg/dl (ref 0.2–1.1)
BUN (UREA NITROGEN): 11 mg/dl (ref 6–20)
CALCIUM: 9.6 mg/dl (ref 8.6–10.3)
CARBON DIOXIDE: 30 mmol/L (ref 22–32)
CHLORIDE: 102 mmol/L (ref 101–111)
CREATININE: 0.5 mg/dl (ref 0.4–1.2)
ESTIMATED GLOMERULAR FILT RATE: >60 mL/min (ref >60)
Glucose Random: 81 mg/dl (ref 74–160)
POTASSIUM: 4.3 mmol/L (ref 3.5–5.1)
SODIUM: 135 mmol/L (ref 135–144)
TOTAL PROTEIN: 7.2 g/dl (ref 5.9–7.5)

## 2011-05-05 LAB — C-REACTIVE PROTEIN: C-REACTIVE PROTEIN: <0.5 mg/dL (ref 0–1.8)

## 2011-05-05 LAB — VITAMIN D,25 HYDROXY: VITAMIN D,25 HYDROXY: 36.2 ng/ml (ref 30.0–100.0)

## 2011-05-05 LAB — CK INDEX: CK INDEX: 1.9 %

## 2011-05-05 LAB — CHG CREATINE KINASE TOTAL: CREATINE KINASE TOTAL: 246 IU/L (ref 21–215)

## 2011-05-05 LAB — CREATINE, MB FRACTION: CKMB: 4.6 ng/mL (ref 0.0–4.0)

## 2011-05-05 NOTE — Progress Notes (Signed)
Date of Service: 05/04/2011    HISTORY OF PRESENT ILLNESS:  This is a follow-up visit for this 35 year old woman with dermatomyositis with an overlap of scleroderma and a rash that showed changes in the setting of an ANA 1:640.    She was diagnosed with dermatomyositis in 2005 based on a muscle biopsy.  She had a skin biopsy of her upper arm in the past that did not show evidence of scleroderma.  Symptoms previously included fevers, aphthous ulcers, weakness, slight elevation in CK, malar rash, discoid lesions, eyelid erythema, and Gottron's papules.  She was treated with steroids, IVIG, and methotrexate.  She was also started on hydroxychloroquine, mycophenolate, and rituximab.    The patient's care has been disjointed with her initially being treated in [place] and then [place] and now she is back in [place].  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    She has been tapering down her prednisone and is now on 5 mg a day.  She is also on mycophenolate 1500 mg twice a day, hydroxychloroquine 400 mg, and methotrexate 25 mg a week.    From examination, she looks like she has some sclerodermatous changes on her face and on her hands.  Chest x-ray in December 2011 was normal.  PFTs in May of 2012 showed minimal obstructive dysfunction.  CT scan in August 2011 showed no lymphadenopathy, small dependent densities thought to be atelectasis, and small pleural lesion.  Echocardiogram from April 2011 was normal.    She currently denies fevers, unintentional weight loss, nausea, vomiting, hematochezia, diarrhea, peripheral rashes, digital ulcerations, aphthous ulcers, fevers, weakness, myalgias, arthralgias, joint swelling.    PAST MEDICAL HISTORY:  Shingles on her face, complicated by cellulitis  Breast lump, thought to be inflammatory in nature    Heat rash when she was in the sun as a child  Proteinuria with  pregnancy  Seasonal allergies  Anemia  Erythema nodosum    SOCIAL HISTORY:  No alcohol, no tobacco.    ALLERGIES:  No known drug allergies.    CURRENT MEDICATIONS:  Calcium, vitamin D, multivitamin, folic acid 1 mg, hydroxychloroquine 400 mg, methotrexate 20 mg a week, mupirocin for her scalp, mycophenolate 1500 mg twice a day, prednisone 5 mg.    PHYSICAL EXAMINATION:  VITAL SIGNS:  Weight is 157 pounds, blood pressure 118/70, pulse 75.  Current pain 0/10.  GENERAL:  She is in no acute distress.  She is alert and oriented.  She is wearing a wig.  On evaluation of her scalp, a lot of her hair has actually grown back.  SKIN:  There are areas of alopecia with some erythema of the scalp underneath.  During the visit, she is scratching her scalp.  Peripherally, she has hypo and hyperpigmented areas.  She has some what feels like sclerodermatous changes on the hands and on the face.  No digital ulcerations.  No cyanosis.  MUSCULOSKELETAL:  No synovitis in the hands or wrists.  She has elbow contractures.  She has very low muscle bulk and tone in the arms.  Her legs are much larger than her arms, but she does not have edema.  No effusions noted in the legs.  She has mild xerosis.       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    PLAN:  She will continue with mycophenolate, methotrexate, and  hydroxychloroquine.  She needs periodic evaluation by an ophthalmologist.  I will check surveillance labs today, especially renal and liver functions.  She will continue with calcium and vitamin D supplementation.    I recommend topical scalp steroid solution to see if it will help with the erythema and perhaps she may have some hair growth.  She has no open scalp lesions, so I do not think that she needs to use her mupirocin right now.  I recommend switching from Sauve shampoo to either generic of Selsun Blue or generic of Neutrogena tar shampoo.  I also recommend switching from her moisturizing lotion, which she does not use consistently, to a cream such as Eucerin Calming Cream, Aveeno, or Sarna.    I will see the patient back in 3 months.  If she continues to do relatively well, I may check her PFTs only on an annual basis instead of more frequently since she is asymptomatic and her PFTs did not show a restrictive pattern.    ___________________________  Reviewed and Electronically Signed By: Charlane Ferretti MD  Sig Date: 05/05/2011  Sig Time: 11:48:39  Dictated By: Charlane Ferretti MD  Dict Date: 05/04/2011 Dict Time: 04 03 PM    Dictation Date and Time:05/04/2011 16:03:35  Transcription Date and Time:05/04/2011 19:40:52  eScription Dictation id: [id number] Confirmation # :[id number]

## 2011-07-13 ENCOUNTER — Other Ambulatory Visit (HOSPITAL_BASED_OUTPATIENT_CLINIC_OR_DEPARTMENT_OTHER): Payer: Self-pay | Admitting: Registered Nurse

## 2011-07-13 DIAGNOSIS — M349 Systemic sclerosis, unspecified: Secondary | ICD-10-CM

## 2011-07-13 MED ORDER — METHOTREXATE 2.5 MG PO TABS
25.0000 mg | ORAL_TABLET | ORAL | Status: DC
Start: 2011-07-13 — End: 2011-07-14

## 2011-07-14 ENCOUNTER — Other Ambulatory Visit (HOSPITAL_BASED_OUTPATIENT_CLINIC_OR_DEPARTMENT_OTHER): Payer: Self-pay | Admitting: Internal Medicine

## 2011-07-14 DIAGNOSIS — M3313 Other dermatomyositis without myopathy: Secondary | ICD-10-CM

## 2011-07-14 DIAGNOSIS — M3390 Dermatopolymyositis, unspecified, organ involvement unspecified: Secondary | ICD-10-CM

## 2011-07-14 MED ORDER — METHOTREXATE 2.5 MG PO TABS
25.0000 mg | ORAL_TABLET | ORAL | Status: DC
Start: 2011-07-14 — End: 2011-11-06

## 2011-07-15 ENCOUNTER — Encounter (HOSPITAL_BASED_OUTPATIENT_CLINIC_OR_DEPARTMENT_OTHER): Payer: Self-pay | Admitting: Internal Medicine

## 2011-07-27 ENCOUNTER — Encounter (HOSPITAL_BASED_OUTPATIENT_CLINIC_OR_DEPARTMENT_OTHER): Payer: Self-pay

## 2011-07-27 ENCOUNTER — Ambulatory Visit (HOSPITAL_BASED_OUTPATIENT_CLINIC_OR_DEPARTMENT_OTHER): Payer: PRIVATE HEALTH INSURANCE

## 2011-07-27 DIAGNOSIS — Z09 Encounter for follow-up examination after completed treatment for conditions other than malignant neoplasm: Secondary | ICD-10-CM

## 2011-08-06 ENCOUNTER — Ambulatory Visit (HOSPITAL_BASED_OUTPATIENT_CLINIC_OR_DEPARTMENT_OTHER): Payer: PRIVATE HEALTH INSURANCE | Admitting: Internal Medicine

## 2011-08-06 VITALS — BP 110/74 | HR 73 | Wt 156.0 lb

## 2011-08-06 DIAGNOSIS — M3313 Other dermatomyositis without myopathy: Secondary | ICD-10-CM

## 2011-08-06 DIAGNOSIS — M3390 Dermatopolymyositis, unspecified, organ involvement unspecified: Secondary | ICD-10-CM

## 2011-08-06 LAB — CHG HEPATIC FUNCTION PANEL
ALANINE AMINOTRANSFERASE: 14 IU/L (ref 7–35)
ALBUMIN: 4.4 g/dl (ref 3.4–4.8)
ALKALINE PHOSPHATASE: 30 IU/L (ref 25–106)
ASPARTATE AMINOTRANSFERASE: 24 IU/L (ref 8–34)
BILIRUBIN DIRECT: 0.1 mg/dl (ref 0.0–0.2)
BILIRUBIN TOTAL: 0.8 mg/dl (ref 0.2–1.1)
INDIRECT BILIRUBIN: 0.7 mg/dl (ref 0.2–0.9)
TOTAL PROTEIN: 7.2 g/dl (ref 5.9–7.5)

## 2011-08-06 LAB — CBC WITH PLATELET
HEMATOCRIT: 34.1 % (ref 34.1–44.9)
HEMOGLOBIN: 11.6 g/dL (ref 11.2–15.7)
MEAN CORP HGB CONC: 34 g/dL (ref 31.0–37.0)
MEAN CORPUSCULAR HGB: 32.3 pg (ref 26.0–34.0)
MEAN CORPUSCULAR VOL: 95 fL (ref 80.0–100.0)
MEAN PLATELET VOLUME: 9.9 fL (ref 8.7–12.5)
PLATELET COUNT: 283 10*3/uL (ref 150–400)
RBC DISTRIBUTION WIDTH STD DEV: 42.6 fL (ref 35.1–46.3)
RBC DISTRIBUTION WIDTH: 12.6 % (ref 11.5–14.3)
RED BLOOD CELL COUNT: 3.59 M/uL — ABNORMAL LOW (ref 3.90–5.20)
WHITE BLOOD CELL COUNT: 4.8 10*3/uL (ref 4.0–11.0)

## 2011-08-06 LAB — CK INDEX: CK INDEX: 2 %

## 2011-08-06 LAB — CREATINE, MB FRACTION: CKMB: 5.7 ng/mL (ref 0.0–4.0)

## 2011-08-06 LAB — CHG CREATININE BLOOD: CREATININE: 0.4 mg/dl (ref 0.4–1.2)

## 2011-08-06 LAB — CHG CREATINE KINASE TOTAL: CREATINE KINASE TOTAL: 282 IU/L (ref 21–215)

## 2011-08-06 NOTE — Progress Notes (Signed)
The primary progress note for this visit has been dictated through E-Scription. It can be viewed as an attachment to this encounter or through Chart Review under the Other Tab as an Rheumatology Office Note.      Review of Systems: Constitutional, Eyes, ENT/Mouth, Cardiovascular, Respiratory, GI, GU, Neuro, Psych, Heme/Lymph, Skin, Musculoskeletal was reviewed and is NEGATIVE except for what is dictated in the note.

## 2011-08-12 NOTE — Progress Notes (Signed)
Date of Service: 08/06/2011    HISTORY OF PRESENT ILLNESS:  This is a follow-up visit for this 35 year old English-speaking woman who has dermatomyositis and what appears to be an overlap with scleroderma, erythema nodosum, discoid lesions, and malar rash in the setting of an ANA 1:640.    She was diagnosed with dermatomyositis in 2008 by muscle biopsy.  She had a skin biopsy of her upper arm that did not show scleroderma.  She previously had fevers, aphthous ulcers, weakness, mild elevation in CK, malar rash, discoid lesions, eyelid erythema, and papules.  She was treated with IVIG, steroids, methotrexate, hydroxychloroquine, mycophenolate, and rituximab.  She is currently on mycophenolate, hydroxychloroquine, and methotrexate.  She is tolerating the medications.  She has alopecia on the scalp, but no new lesions.  She has some cracking in the skin and the hands, which she attributes to the dry weather.  She is feeling well overall.  She denies fevers, rashes, unintentional weight loss, chest pain, dyspnea, cough.    Chest x-ray from 2011 was normal.  PFTs May 2012 showed minimal obstructive dysfunction.  Echocardiogram from April 2011 was normal.  CT scan of her chest did not show fibrosis.    PAST MEDICAL HISTORY:  Shingles on her face, complicated by cellulitis  Breast lump, thought to be inflammatory in nature  Heat rash in the sun  Proteinuria with pregnancy  Seasonal allergies  Anemia  Erythema nodosum    SOCIAL HISTORY:  No alcohol, no tobacco.  I have filled out her disability paperwork    ALLERGIES:  No known drug allergies.    CURRENT MEDICATIONS:  Calcium, vitamin D, multivitamin, topical clobetasol, folic acid, hydroxychloroquine, methotrexate 25 mg a week, mupirocin ointment, mycophenolate 1500 mg twice a day, prednisone 5 mg.    PHYSICAL EXAMINATION:  VITAL SIGNS:  Weight is 156 pounds, blood pressure 110/74, pulse 73, current pain 0/10.  GENERAL:  She is no acute distress.  She is alert and oriented.     HEENT:  On evaluation of her scalp, she has areas of alopecia.  There is a small flaky dermatitis, but there are no rashes.  Anicteric sclerae, noninjected conjunctivae.  Oropharynx clear.  SKIN:  Skin changes on the face, skin changes on the hands and arms.  There is some cracking on the palmar surface of the fingers.  There is no cyanosis.  Skin appears to be sclerodermatous.  MUSCULOSKELETAL:  No synovitis is appreciated.    LABORATORY DATA:  July 2012:  WBC 5; hematocrit 35.8; MCV 102; platelets 236,000  Creatinine 0.5, GFR greater than 60, normal LFTs, albumin 4.3  CK 246  CRP less than 0.5  Vitamin D 36    ASSESSMENT:  A 35 year old woman with dermatomyositis, malar rash, aphthous ulcers, discoid lesions, and what appears to be some sclerodermatous changes in what appears to be an overlap between dermatomyositis, scleroderma, and lupus.  PLAN:  Surveillance labs today.  She will continue with her current regimen except she will taper back her prednisone to 5 mg alternating with 2.5 mg.  After 4 weeks if she does well, she can try tapering back to 2.5 mg a day.    ___________________________  Reviewed and Electronically Signed By: Charlane Ferretti MD  Sig Date: 08/12/2011  Sig Time: 14:16:30  Dictated By: Charlane Ferretti MD  Dict Date: 08/06/2011 Dict Time: 04 11 PM    Dictation Date and Time:08/06/2011 16:11:24  Transcription Date and Time:08/06/2011 17:50:40  eScription Dictation id: [id number] Confirmation # :  [id number]

## 2011-08-21 ENCOUNTER — Encounter (HOSPITAL_BASED_OUTPATIENT_CLINIC_OR_DEPARTMENT_OTHER): Payer: Self-pay | Admitting: Internal Medicine

## 2011-09-08 ENCOUNTER — Other Ambulatory Visit (HOSPITAL_BASED_OUTPATIENT_CLINIC_OR_DEPARTMENT_OTHER): Payer: Self-pay

## 2011-09-08 MED ORDER — PREDNISONE 5 MG PO TABS
5.0000 mg | ORAL_TABLET | Freq: Every day | ORAL | Status: DC
Start: 2011-09-08 — End: 2011-11-05

## 2011-09-08 MED ORDER — MYCOPHENOLATE MOFETIL 500 MG PO TABS
1500.0000 mg | ORAL_TABLET | Freq: Two times a day (BID) | ORAL | Status: DC
Start: 2011-09-08 — End: 2011-11-05

## 2011-09-10 ENCOUNTER — Ambulatory Visit (HOSPITAL_BASED_OUTPATIENT_CLINIC_OR_DEPARTMENT_OTHER): Payer: PRIVATE HEALTH INSURANCE | Admitting: Internal Medicine

## 2011-09-10 VITALS — Wt 159.0 lb

## 2011-09-10 DIAGNOSIS — Z124 Encounter for screening for malignant neoplasm of cervix: Secondary | ICD-10-CM

## 2011-09-10 DIAGNOSIS — Z23 Encounter for immunization: Secondary | ICD-10-CM

## 2011-09-10 DIAGNOSIS — Z0000 Encounter for general adult medical examination without abnormal findings: Secondary | ICD-10-CM

## 2011-09-10 DIAGNOSIS — M3313 Other dermatomyositis without myopathy: Secondary | ICD-10-CM

## 2011-09-10 DIAGNOSIS — M3390 Dermatopolymyositis, unspecified, organ involvement unspecified: Secondary | ICD-10-CM

## 2011-09-10 NOTE — Progress Notes (Signed)
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  Traveling back and forth from home in [place]  Autistic son doing well  Volunteering with homeless kids  Wants to go back to school to major in psychology to become counselor or therapist    DERMATOMYOSITIS  Doing "OK"  Seeing Dr. Lenor Derrick regularly.   No major med changes except for titration of prednisone  Cold and heat both present challenges for her.     HM  As per scanned records from OSH, pap normal in 2011 however no HPV performed.   One new female sexual partner since last HIV test but would like to repeat screen  Lipid screen due  Has IUD in place  Has an upcoming dental appointment    OTHER  Says occasionally feels stinging over labia when urinating. Comes and goes, never noticed anything wrong with area on inspection    FH  ++DM in Dad in 30's, grandmother somewhat later    Most Recent Weight Reading(s)  09/10/11 : 159 lb (72.122 kg)  08/06/11 : 156 lb (70.761 kg)  05/04/11 : 157 lb 8 oz (71.442 kg)    MEDICATIONS:    Current outpatient prescriptions:  predniSONE (DELTASONE) 5 MG tablet Take 1 tablet by mouth daily. Take with 2.5mg  tablets as directed by MD. Disp: 30 tablet Rfl: 3   mycophenolate (CELLCEPT) 500 MG tablet Take 3 tablets by mouth 2 (two) times daily. Disp: 180 tablet Rfl: 3   methotrexate 2.5 MG tablet Take 10 tablets by mouth once a week. Disp: 43 tablet Rfl: 3   clobetasol (TEMOVATE) 0.05 % external solution Apply  topically 2 (two) times daily. Disp: 50 mL Rfl: 5   folic acid (FOLVITE) 1 MG tablet Take 2 tablets by mouth daily. Dose increase Disp: 60 tablet Rfl: 11   predniSONE (DELTASONE) 2.5 MG tablet Take  1 tablet by mouth daily. Use with 5mg  tablets for MD directed taper Disp: 30 tablet Rfl: 3   Calcium Carbonate-Vitamin D (CALCIUM 600/VITAMIN D) 600-400 MG-UNIT TABS Tablet Take 1 tablet by mouth 2 (two) times daily. Disp: 60 tablet Rfl: 12   Multiple Vitamin (MULTIVITAMIN) tablet Take 1 tablet by mouth daily. Disp: 30 tablet Rfl: 12   hydroxychloroquine (PLAQUENIL) 200 MG tablet Take 2 tablets by mouth daily. Disp: 60 tablet Rfl: 12   cholecalciferol (VITAMIN D) 1000 UNIT capsule Take 1 capsule by mouth daily. Disp: 30 capsule Rfl: 11   mupirocin (BACTROBAN) 2 % ointment Apply  topically 3 (three) times daily. Disp: 22 g Rfl: 11         ALLERGIES:  Review of Patient's Allergies indicates:  No Known Allergies    OBJECTIVE:      PHYSICAL EXAM:     09/10/11  1644   Weight: 159 lb (72.122 kg)       Constitutional: Well developed, Well nourished, No acute distress, Non-toxic appearance.   HEENT: Normocephalic, Atraumatic, Bilateral external ears with taught skin over oracles bilaterally, Oropharynx moist, No oral exudates, PERRLA. Focal areas of alopecia and hypopigmentation over scalp  Cardiovascular: Normal heart rate, Normal rhythm, No  murmurs  Thorax & Lungs: Normal breath sounds, No respiratory distress, No wheezing, No chest tenderness.   Abdomen: Soft, Non tender, No guarding, No masses, Normal bowel sounds.  Skin: taught skin over fingers and dorsal aspect of  Hands with associated hypopigmentation, areas of skin irritation and breakdown with now open, draining areas. Hypopigmentation of periorbital regions, anterior thorax.  Breasts: symmetric, no nipple discharge, breast tenderness or masses appreciated. No axillary LAD.   GYN: No inguinal LAD, no CMT, no adenexal fulness, external genitalia and vagina pink and moist without ulceration, Cervix +ectropion with IUD string protruding from OS. Mild amount of bleeding precipitated by PAP instruments  Extremities: Intact distal pulses, No edema.  Neurologic:  No  focal deficits noted.     ASSESSMENT:     V70.0 Routine general medical examination at a health care facility  (primary encounter diagnosis)  Comment: Overall pt is really doing well despite a very challenging set of circumstances. Pap with HPV done today  Plan: HIV 1 AND 2 PLUS O ANTIBODY, LIPID PANEL, HEP B        SURFACE ANTIGEN, HEP B SURFACE ANTIBODY,         CYTOPATH, C/V, THIN LAYER, HUMAN         PAPILLOMAVIRUS, HEMOGLOBIN A1C    V04.81 Need for prophylactic vaccination and inoculation against influenza  Plan: IMMUNIZATION ADMIN SINGLE, RN, INFLUENZA VIRUS         VACCINE SPLIT VIRUS 3/> YRS IM            V06.1 Vaccin for DTP  Plan: IMMUNIZATION ADMIN SINGLE, RN, TDAP VACCINE 7/>        YR IM       710.3 Dermatomyositis  Comment: Stable  Plan: Continue rheum f-u    OTHER  Labia burning on urination  Exam wnl. Patient asxs at this time, may be micro perforations from toilet paper/vigorous wiping  Plan to follow

## 2011-09-10 NOTE — Progress Notes (Signed)
PRECEPTOR NOTE - RESIDENT VISIT  S: 2nd visit followed by Dr. Lenor Derrick with dermatomyositis/overlap syndrome  Here for check up  C/o external urinary burning  O: per resident  A/P: flu vax, TDAP, HIV, lipids, aic, hep B screen  Please see resident's note for further details.

## 2011-09-10 NOTE — Progress Notes (Signed)
Pt, Julie Freeman, requesting Flu Vaccine 0.5 ml IM today.  Pt denies allergies to this vaccine.  Pt denies allergies to egg or egg products.  Pt denies allergy to contact lens solution.  Pt denies pregnancy.  Pt denies adverse effects from previous administration of this medication, denies any other problems to a previous influenza vaccine. Pt denies history of Guillain-Barre' syndrome.  Pt denies current moderate/severe illness at this time.  Information; Risks and benefits of Flu Vaccine reviewed with pt.   Current Vis for Flu Vaccine (See immune module for date of publication please) offered @ this time and reviewed with pt/guardian.    Comfort measures for possible side effects reviewed.   Flu Vaccine 0.5.ml IM administered @ this time in the LT deltoid and tolerated well by pt.  Pt denies adverse effects from injection @ this time.   Pt encouraged to utilize LT arm and not favor it.  Pt also encouraged to take pain reliever of choice and to apply ice for discomfort if necessary.  Pt will call with any questions or concerns.  Please refer to Imm./Inj. section for administration site, lot # and exp. Date.   Immunization information reviewed.   Current VIS for Tdap  reviewed and given to patient.      See immunization/Injection module or chart review for date of publication and additional information.   Comfort measures for possible side effects reviewed.   Allergies reviewed  Vaccine(s) administered - well tol

## 2011-09-11 DIAGNOSIS — N83202 Unspecified ovarian cyst, left side: Secondary | ICD-10-CM

## 2011-09-11 DIAGNOSIS — N83201 Unspecified ovarian cyst, right side: Secondary | ICD-10-CM | POA: Insufficient documentation

## 2011-09-11 DIAGNOSIS — Z30431 Encounter for routine checking of intrauterine contraceptive device: Secondary | ICD-10-CM | POA: Insufficient documentation

## 2011-09-11 DIAGNOSIS — IMO0001 Reserved for inherently not codable concepts without codable children: Secondary | ICD-10-CM | POA: Insufficient documentation

## 2011-09-11 DIAGNOSIS — Z111 Encounter for screening for respiratory tuberculosis: Secondary | ICD-10-CM | POA: Insufficient documentation

## 2011-09-11 LAB — HEMOGLOBIN A1C
ESTIMATED AVERAGE GLUCOSE: 88 (ref 74–160)
HEMOGLOBIN A1C: 4.7 % (ref 4.0–5.6)

## 2011-09-14 LAB — HIV 1 AND 2 PLUS O ANTIBODY: HIV 1 AND 2 PLUS O SCREEN: NONREACTIVE

## 2011-09-14 LAB — HEPATITIS B SURFACE ANTIBODY: HEPATITIS B SURFACE ANTIBODY: REACTIVE

## 2011-09-14 LAB — CHG LIPID PANEL
Cholesterol: 144 mg/dl (ref 0–200)
HIGH DENSITY LIPOPROTEIN: 61 mg/dl (ref 35–85)
LOW DENSITY LIPOPROTEIN DIRECT: 71 mg/dl (ref 0–100)
RISK FACTOR: 2.4 (ref ?–4.4)
TRIGLYCERIDES: 51 mg/dl (ref 0–150)

## 2011-09-14 LAB — HEPATITIS B SURFACE ANTIGEN: HEPATITIS B SURFACE ANTIGEN: NONREACTIVE

## 2011-09-16 LAB — HUMAN PAPILLOMAVIRUS (HPV): HUMAN PAPILLOMAVIRUS: NEGATIVE

## 2011-09-18 LAB — CYTOPATH, C/V, THIN LAYER

## 2011-11-05 ENCOUNTER — Other Ambulatory Visit (HOSPITAL_BASED_OUTPATIENT_CLINIC_OR_DEPARTMENT_OTHER): Payer: Self-pay | Admitting: Internal Medicine

## 2011-11-05 ENCOUNTER — Other Ambulatory Visit (HOSPITAL_BASED_OUTPATIENT_CLINIC_OR_DEPARTMENT_OTHER): Payer: Self-pay

## 2011-11-05 MED ORDER — PREDNISONE 5 MG PO TABS
5.0000 mg | ORAL_TABLET | Freq: Every day | ORAL | Status: DC
Start: 2011-11-05 — End: 2012-03-03

## 2011-11-05 MED ORDER — MYCOPHENOLATE MOFETIL 500 MG PO TABS
1500.0000 mg | ORAL_TABLET | Freq: Two times a day (BID) | ORAL | Status: DC
Start: 2011-11-05 — End: 2012-03-03

## 2011-11-05 NOTE — Telephone Encounter (Signed)
Last methotrexate labs 08/06/11 has RV with Dr. Lenor Derrick on 11/10/11

## 2011-11-06 ENCOUNTER — Other Ambulatory Visit (HOSPITAL_BASED_OUTPATIENT_CLINIC_OR_DEPARTMENT_OTHER): Payer: Self-pay

## 2011-11-06 MED ORDER — CLOBETASOL PROPIONATE 0.05 % EX SOLN
Freq: Two times a day (BID) | CUTANEOUS | Status: DC
Start: 2011-11-06 — End: 2012-05-17

## 2011-11-06 NOTE — Telephone Encounter (Signed)
Pt has an appt next Tuesday.  She would like to be able to pick up her meds the same day she comes for the appt.  Pt was supposed to be alternating prednisone 5 mg with 2.5 mg.  She has not been doing that.  Her pain increased & she has stayed on prednisone 5 mg.  She will discuss with Dr Lenor Derrick on Tuesday about prednisone dosing.

## 2011-11-06 NOTE — Telephone Encounter (Signed)
All but one ordered yesterday.

## 2011-11-10 ENCOUNTER — Ambulatory Visit (HOSPITAL_BASED_OUTPATIENT_CLINIC_OR_DEPARTMENT_OTHER): Payer: PRIVATE HEALTH INSURANCE | Admitting: Internal Medicine

## 2011-11-10 VITALS — BP 122/73 | HR 73 | Wt 172.0 lb

## 2011-11-10 DIAGNOSIS — M3390 Dermatopolymyositis, unspecified, organ involvement unspecified: Secondary | ICD-10-CM

## 2011-11-10 DIAGNOSIS — M25569 Pain in unspecified knee: Secondary | ICD-10-CM

## 2011-11-10 DIAGNOSIS — M3313 Other dermatomyositis without myopathy: Secondary | ICD-10-CM

## 2011-11-10 LAB — CBC WITH PLATELET
HEMATOCRIT: 34.7 % (ref 34.1–44.9)
HEMOGLOBIN: 11.7 g/dL (ref 11.2–15.7)
MEAN CORP HGB CONC: 33.7 g/dL (ref 31.0–37.0)
MEAN CORPUSCULAR HGB: 32.3 pg (ref 26.0–34.0)
MEAN CORPUSCULAR VOL: 95.9 fL (ref 80.0–100.0)
MEAN PLATELET VOLUME: 10.1 fL (ref 8.7–12.5)
PLATELET COUNT: 259 10*3/uL (ref 150–400)
RBC DISTRIBUTION WIDTH STD DEV: 44 fL (ref 35.1–46.3)
RBC DISTRIBUTION WIDTH: 12.7 % (ref 11.5–14.3)
RED BLOOD CELL COUNT: 3.62 M/uL — ABNORMAL LOW (ref 3.90–5.20)
WHITE BLOOD CELL COUNT: 4.5 10*3/uL (ref 4.0–11.0)

## 2011-11-10 MED ORDER — PREDNISONE 2.5 MG PO TABS
2.5000 mg | ORAL_TABLET | Freq: Every day | ORAL | Status: DC
Start: 2011-11-10 — End: 2012-03-03

## 2011-11-10 NOTE — Progress Notes (Signed)
The primary progress note for this visit has been dictated through E-Scription. It can be viewed as an attachment to this encounter or through Chart Review under the Other Tab as an Rheumatology Office Note.      Review of Systems: Constitutional, Eyes, ENT/Mouth, Cardiovascular, Respiratory, GI, GU, Neuro, Psych, Heme/Lymph, Skin, Musculoskeletal was reviewed and is NEGATIVE except for what is dictated in the note.

## 2011-11-12 ENCOUNTER — Encounter (HOSPITAL_BASED_OUTPATIENT_CLINIC_OR_DEPARTMENT_OTHER): Payer: Self-pay | Admitting: Internal Medicine

## 2011-11-12 LAB — CHG HEPATIC FUNCTION PANEL
ALANINE AMINOTRANSFERASE: 16 IU/L (ref 7–35)
ALBUMIN: 4.3 g/dl (ref 3.4–4.8)
ALKALINE PHOSPHATASE: 28 IU/L (ref 25–106)
ASPARTATE AMINOTRANSFERASE: 21 IU/L (ref 8–34)
BILIRUBIN DIRECT: 0.1 mg/dl (ref 0.0–0.2)
BILIRUBIN TOTAL: 0.7 mg/dl (ref 0.2–1.1)
INDIRECT BILIRUBIN: 0.6 mg/dl (ref 0.2–0.9)
TOTAL PROTEIN: 7.5 g/dl (ref 5.9–7.5)

## 2011-11-12 LAB — CHG CREATININE BLOOD: CREATININE: 0.5 mg/dl (ref 0.4–1.2)

## 2011-11-12 LAB — CHG CREATINE KINASE TOTAL: CREATINE KINASE TOTAL: 185 IU/L (ref 21–215)

## 2011-11-12 LAB — VITAMIN D,25 HYDROXY: VITAMIN D,25 HYDROXY: 37.3 ng/ml (ref 30.0–100.0)

## 2011-12-02 ENCOUNTER — Ambulatory Visit (HOSPITAL_BASED_OUTPATIENT_CLINIC_OR_DEPARTMENT_OTHER): Payer: PRIVATE HEALTH INSURANCE | Admitting: Internal Medicine

## 2011-12-29 NOTE — Progress Notes (Signed)
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  She was diagnosed with dermatomyositis by a muscle biopsy in 2008.  Previous manifestations included fevers, aphthous ulcers, weakness, mild elevations in CK, malar rash and eyelid erythema.  She was treated with IVIG, glucocorticoids, methotrexate, hydroxychloroquine, mycophenolate, and rituximab.  She remains on hydroxychloroquine, mycophenolate, methotrexate, and prednisone.    Last PFTs in May 2012 showed minimal obstructive dysfunction, but no evidence of restrictive disease.  CT scan of her chest without fibrosis.  Echocardiogram April 2011.    She is complaining about bilateral knee pain that is intermittent, sometimes lasting a few minutes and sometimes longer.  No swelling in the knees.  No trauma to the knees.      PAST MEDICAL HISTORY:    Facial shingles complicated by cellulitis  Breast lump, thought to be inflammatory in nature  Heat rash  Proteinuria during pregnancy  Seasonal allergies  Anemia  Erythema nodosum    SOCIAL HISTORY:  No alcohol, no tobacco.  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    ALLERGIES:  No known drug allergies.    CURRENT MEDICATIONS:  Calcium, vitamin D, clobetasol, folic acid, hydroxychloroquine, methotrexate, multivitamin, mycophenolate, prednisone.    PHYSICAL EXAMINATION:  VITAL SIGNS:  Weight is 172 pounds, blood pressure 122/73, pulse 73, current pain 0/10.  GENERAL:  She is no acute distress.  She is alert and oriented.  She has vitiligo and scarred lesions on her scalp, face, and extremities.  MUSCULOSKELETAL:  There is no synovitis in the hands or wrists.  Full range of motion of the elbows and shoulders.  There are no effusions in knees.  No joint line tenderness in the knees.  Her gait is steady  and narrow-based.    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  Continue with the current regimen except for trying to taper back her prednisone to 2.5 mg every day.  For her knee pain, she will take ibuprofen as needed.  If she develops swelling or persistent pain, she will let me know.  I will see her back in 3 months.    ___________________________  Reviewed and Electronically Signed By: Charlane Ferretti MD  Sig Date: 12/29/2011  Sig Time: 12:05:34  Dictated By: Charlane Ferretti MD  Dict Date: 11/10/2011 Dict Time: 04 23 PM    Dictation Date and Time:11/10/2011 16:23:03  Transcription Date and Time:11/10/2011 17:28:05  eScription Dictation id: [id number] Confirmation # :[id number]      cc: Sherre Scarlet MD

## 2012-01-18 ENCOUNTER — Ambulatory Visit (HOSPITAL_BASED_OUTPATIENT_CLINIC_OR_DEPARTMENT_OTHER): Payer: Self-pay | Admitting: Ophthalmology

## 2012-01-19 ENCOUNTER — Ambulatory Visit (HOSPITAL_BASED_OUTPATIENT_CLINIC_OR_DEPARTMENT_OTHER): Payer: Self-pay | Admitting: Ophthalmology

## 2012-02-09 ENCOUNTER — Ambulatory Visit (HOSPITAL_BASED_OUTPATIENT_CLINIC_OR_DEPARTMENT_OTHER): Payer: PRIVATE HEALTH INSURANCE | Admitting: Internal Medicine

## 2012-02-16 ENCOUNTER — Ambulatory Visit (HOSPITAL_BASED_OUTPATIENT_CLINIC_OR_DEPARTMENT_OTHER): Payer: PRIVATE HEALTH INSURANCE | Admitting: Ophthalmology

## 2012-02-20 NOTE — Procedures (Signed)
Date of Service: 12/08/2010    INDICATIONS:  Scleroderma     ATS criteria for acceptability and reproducibility were met.  The patient demonstrated great effort.  FEV1 was 97% of predicted.  FEV1 to FVC ratio is 82%.  There is improvement.  However, on flow in mid to low lung volumes post-bronchodilator lung volumes were within normal limits.  Diffusion capacity was within normal limits.  O2 saturation on room air at rest is 99%.  This was maintained with walking at a normal pace for 500 feet.    IMPRESSION:  Minimal obstructive dysfunction suggested at mid-to-low lung volumes based on significant improvement in flows, which had been mildly reduced. Lung volumes, diffusion capacity, and exercise oximetry are normal.    ___________________________  Reviewed and Electronically Signed By: Karleen Dolphin MD  Sig Date: 02/11/2011  Sig Time: 10:34:07  Dictated By: Karleen Dolphin MD  Dict Date: 12/11/2010 Dict Time: 03 22 PM    Dictation Date and Time:12/11/2010 15:22:28  Transcription Date and Time:12/11/2010 15:45:47  eScription Dictation id: [id number] Confirmation # :[id number]    DICTATED BY: Karleen Dolphin MDMUGE Morrie Daywalt MDD:12/11/2010 15:22:28 T:12/11/2010 15:45:47 CF Job#: [id number]

## 2012-03-01 ENCOUNTER — Encounter (HOSPITAL_BASED_OUTPATIENT_CLINIC_OR_DEPARTMENT_OTHER): Payer: Self-pay | Admitting: Internal Medicine

## 2012-03-02 ENCOUNTER — Ambulatory Visit (HOSPITAL_BASED_OUTPATIENT_CLINIC_OR_DEPARTMENT_OTHER): Payer: PRIVATE HEALTH INSURANCE | Admitting: Ophthalmology

## 2012-03-03 ENCOUNTER — Ambulatory Visit (HOSPITAL_BASED_OUTPATIENT_CLINIC_OR_DEPARTMENT_OTHER): Payer: PRIVATE HEALTH INSURANCE | Admitting: Internal Medicine

## 2012-03-03 VITALS — BP 115/71 | HR 66 | Wt 153.0 lb

## 2012-03-03 DIAGNOSIS — Z5181 Encounter for therapeutic drug level monitoring: Secondary | ICD-10-CM

## 2012-03-03 DIAGNOSIS — M3390 Dermatopolymyositis, unspecified, organ involvement unspecified: Secondary | ICD-10-CM

## 2012-03-03 DIAGNOSIS — M3313 Other dermatomyositis without myopathy: Secondary | ICD-10-CM

## 2012-03-03 LAB — CBC WITH PLATELET
HEMATOCRIT: 34.4 % (ref 34.1–44.9)
HEMOGLOBIN: 11.4 g/dL (ref 11.2–15.7)
MEAN CORP HGB CONC: 33.1 g/dL (ref 31.0–37.0)
MEAN CORPUSCULAR HGB: 31.7 pg (ref 26.0–34.0)
MEAN CORPUSCULAR VOL: 95.6 fL (ref 80.0–100.0)
MEAN PLATELET VOLUME: 9.9 fL (ref 8.7–12.5)
PLATELET COUNT: 240 10*3/uL (ref 150–400)
RBC DISTRIBUTION WIDTH STD DEV: 44.3 fL (ref 35.1–46.3)
RBC DISTRIBUTION WIDTH: 12.9 % (ref 11.5–14.3)
RED BLOOD CELL COUNT: 3.6 M/uL — ABNORMAL LOW (ref 3.90–5.20)
WHITE BLOOD CELL COUNT: 4.4 10*3/uL (ref 4.0–11.0)

## 2012-03-03 LAB — CHG CREATINE KINASE TOTAL: CREATINE KINASE TOTAL: 186 IU/L (ref 21–215)

## 2012-03-03 LAB — CHG CREATININE BLOOD: CREATININE: 0.5 mg/dl (ref 0.4–1.2)

## 2012-03-03 LAB — CHG HEPATIC FUNCTION PANEL
ALANINE AMINOTRANSFERASE: 18 IU/L (ref 7–35)
ALBUMIN: 4.1 g/dl (ref 3.4–4.8)
ALKALINE PHOSPHATASE: 29 IU/L (ref 25–106)
ASPARTATE AMINOTRANSFERASE: 22 IU/L (ref 8–34)
BILIRUBIN DIRECT: 0.1 mg/dl (ref 0.0–0.2)
BILIRUBIN TOTAL: 0.7 mg/dl (ref 0.2–1.1)
INDIRECT BILIRUBIN: 0.6 mg/dl (ref 0.2–0.9)
TOTAL PROTEIN: 7.1 g/dl (ref 5.9–7.5)

## 2012-03-03 MED ORDER — METHOTREXATE 2.5 MG PO TABS
ORAL_TABLET | ORAL | Status: DC
Start: 2012-03-03 — End: 2012-07-12

## 2012-03-03 MED ORDER — PREDNISONE 2.5 MG PO TABS
2.5000 mg | ORAL_TABLET | Freq: Every day | ORAL | Status: DC
Start: 2012-03-03 — End: 2012-07-14

## 2012-03-03 MED ORDER — MYCOPHENOLATE MOFETIL 500 MG PO TABS
1500.0000 mg | ORAL_TABLET | Freq: Two times a day (BID) | ORAL | Status: DC
Start: 2012-03-03 — End: 2012-07-12

## 2012-03-03 MED ORDER — PREDNISONE 5 MG PO TABS
5.0000 mg | ORAL_TABLET | Freq: Every day | ORAL | Status: DC
Start: 2012-03-03 — End: 2012-07-14

## 2012-03-03 NOTE — Progress Notes (Signed)
The primary progress note for this visit has been dictated through E-Scription. It can be viewed as an attachment to this encounter or through Chart Review under the Other Tab as an Rheumatology Office Note.      Review of Systems: Constitutional, Eyes, ENT/Mouth, Cardiovascular, Respiratory, GI, GU, Neuro, Psych, Heme/Lymph, Skin, Musculoskeletal was reviewed and is NEGATIVE except for what is dictated in the note.

## 2012-03-03 NOTE — Progress Notes (Signed)
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  She has a history of erythema nodosum, discoid lupus lesions, and malar rash.  She has an ANA 1:640.  Previous manifestations including fevers, aphthous ulcers, weakness, mildly elevated CK, and eyelid erythema.  Muscle biopsy in 2008 was suggestive of dermatomyositis.  Previous treatments include IVIG, high-dose glucocorticoids and rituximab.  She remains on hydroxychloroquine, mycophenolate, methotrexate, and prednisone.  She is down to 5 mg alternating 2.5 mg of prednisone.  She is doing relatively well.  Occasionally, she will have intermittent knee discomfort without swelling.  He has had no recent rashes or weakness.    PFTs May 2012 showed minimal obstructive pattern.  Last CT of her chest was from an outside facility.  Last echocardiogram April 2011.    She denies chest pain, dyspnea, cough, fevers, unintentional weight loss.    INTERIM HISTORY:  She will be attending school in the fall.  She has a son who she sees on a weekly basis but lives with his father in [place].    PAST MEDICAL HISTORY:    1.  Facial shingles complicated by cellulitis  2.  Breast lump, thought to be inflammatory in nature    3.  Proteinuria with pregnancy    4.  Seasonal allergies    5.  Anemia  6.  Erythema nodosum    SOCIAL HISTORY:  No alcohol, no tobacco.       ALLERGIES AND ADVERSE REACTIONS:  No known drug allergies.    CURRENT MEDICATIONS:  Calcium, vitamin D, topical clobetasol solution, mupirocin ointment as needed, folic acid, hydroxychloroquine, methotrexate, multivitamin, mycophenolate, prednisone.    PHYSICAL EXAMINATION:  VITAL SIGNS:  Weight is 153 pounds, blood pressure 115/71.  Pulse is 66, pain 0/10.  GENERAL:  She is in no acute distress.  She is alert and oriented.  Her scalp has areas of alopecia and erythema, but there is no  new rash.  HEENT:  No open lesions, somewhat sclerodermatous skin on the face but no rash.  Oropharynx is clear.  NECK:  Supple.  LUNGS:  Clear.  EXTREMITIES:  No pitting edema.  Peripheral skin shiny and areas of tightness.  There are no digital ulcerations.  No cyanosis.  MUSCULOSKELETAL:  No synovitis.  No joint line tenderness of the knees.       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    PLAN:  Continue current regimen of mycophenolate, methotrexate, and hydroxychloroquine.  She will try to decrease prednisone 2.5 mg every day.  Surveillance labs today.  I will see her back in 3 months.    ___________________________  Reviewed and Electronically Signed By: Charlane Ferretti MD  Sig Date: 03/16/2012  Sig Time: 15:01:38  Dictated By: Charlane Ferretti MD  Dict Date: 03/03/2012 Dict Time: 04 04 PM    Dictation Date and Time:03/03/2012 16:04:04  Transcription Date and Time:03/03/2012 16:59:32  eScription Dictation id: [id number] Confirmation # :[id number]      cc: Sherre Scarlet MD    DICTATED BY: Charlane Ferretti MDSUZANNE L Giara Mcgaughey MDD:03/03/2012 16:04:04 T:03/03/2012 16:59:32 CN Job#: [id number]

## 2012-03-11 ENCOUNTER — Encounter (HOSPITAL_BASED_OUTPATIENT_CLINIC_OR_DEPARTMENT_OTHER): Payer: Self-pay | Admitting: Internal Medicine

## 2012-04-04 ENCOUNTER — Telehealth (HOSPITAL_BASED_OUTPATIENT_CLINIC_OR_DEPARTMENT_OTHER): Payer: Self-pay

## 2012-04-04 NOTE — Progress Notes (Signed)
Pt returned call looking to go to [facility] to have wisdom teeth pulled. Per pt needs clearance from Dr. Lenor Derrick.  I called [facility] [phone] and spoke to Alycia Rossetti we can fax to [phone]

## 2012-04-04 NOTE — Telephone Encounter (Signed)
Message copied by Kenyon Ana ANN on Mon Apr 04, 2012 12:49 PM  ------       Message from: Don Broach       Created: Mon Apr 04, 2012 11:26 AM       Regarding: [place]       Contact: [phone]         Patient would like to speak to a nurse or Dr Lenor Derrick in regards to patient dental surgery. Patient state Dentist would like Dr Lenor Derrick to give the OK that patient can have her dental surgery. Please call back  ------

## 2012-04-04 NOTE — Progress Notes (Signed)
LM for pt to call

## 2012-04-05 NOTE — Progress Notes (Signed)
.  Julie Freeman to fax letter to [place] at [facility].

## 2012-04-07 ENCOUNTER — Telehealth (HOSPITAL_BASED_OUTPATIENT_CLINIC_OR_DEPARTMENT_OTHER): Payer: Self-pay | Admitting: Internal Medicine

## 2012-04-07 NOTE — Progress Notes (Signed)
.  spoke to paula NP regarding Julie Freeman, sent over her medication list per Dr Lenor Derrick

## 2012-04-07 NOTE — Progress Notes (Signed)
I left a message for the patient to double her prednisone dose from 7.5mg  to 15mg  the morning of her wisdom teeth removal and then again the morning after.  She has horizontal growing teeth.  No antibiotics outside of the realm of what is normally given.  Monitor for signs of infection.

## 2012-05-05 ENCOUNTER — Encounter (HOSPITAL_BASED_OUTPATIENT_CLINIC_OR_DEPARTMENT_OTHER): Payer: Self-pay | Admitting: Ophthalmology

## 2012-05-05 ENCOUNTER — Ambulatory Visit (HOSPITAL_BASED_OUTPATIENT_CLINIC_OR_DEPARTMENT_OTHER): Payer: PRIVATE HEALTH INSURANCE | Admitting: Ophthalmology

## 2012-05-05 DIAGNOSIS — H5210 Myopia, unspecified eye: Secondary | ICD-10-CM

## 2012-05-05 DIAGNOSIS — Z09 Encounter for follow-up examination after completed treatment for conditions other than malignant neoplasm: Secondary | ICD-10-CM

## 2012-05-05 NOTE — Nursing Note (Signed)
>>   Julie Freeman     Thu May 05, 2012  3:44 PM  Here for an annual eye exam: patient takes plaquenil for dermatomyositis.  Patient had a HVF done on 07/27/11: here for results.    No complaints or concerns.

## 2012-05-05 NOTE — Progress Notes (Signed)
Julie Freeman was seen at the [facility] center.    She takes plaquenil and prednisone.    On exam there were no macular changes noted.  There were no cataracts or signs of glaucoma.    She should RTC in 4-6 month for a visual field test.    She will rtc in 1 year for a dilated fundus exam.

## 2012-05-10 ENCOUNTER — Ambulatory Visit (HOSPITAL_BASED_OUTPATIENT_CLINIC_OR_DEPARTMENT_OTHER): Payer: PRIVATE HEALTH INSURANCE | Admitting: Internal Medicine

## 2012-05-10 VITALS — BP 112/74 | Ht 68.0 in | Wt 150.0 lb

## 2012-05-10 DIAGNOSIS — N94819 Vulvodynia, unspecified: Secondary | ICD-10-CM

## 2012-05-10 DIAGNOSIS — T169XXA Foreign body in ear, unspecified ear, initial encounter: Secondary | ICD-10-CM

## 2012-05-10 DIAGNOSIS — H6120 Impacted cerumen, unspecified ear: Secondary | ICD-10-CM

## 2012-05-10 DIAGNOSIS — Z7189 Other specified counseling: Secondary | ICD-10-CM

## 2012-05-10 DIAGNOSIS — R300 Dysuria: Secondary | ICD-10-CM

## 2012-05-10 LAB — URINE DIP (POINT OF CARE)
BILIRUBIN, URINE: NEGATIVE
GLUCOSE, URINE: NEGATIVE mg/dl
KETONE, URINE: NEGATIVE mg/dl
LEUKOCYTE ESTERASE: NEGATIVE
NITRITE, URINE: NEGATIVE
OCCULT BLOOD, URINE: NEGATIVE
PH URINE: 5.5 (ref 5.0–8.0)
PROTEIN, URINE: NEGATIVE mg/dl (ref 0–15)
SPECIFIC GRAVITY URINE: 1.02 (ref 1.003–1.030)
UROBILINOGEN URINE: 0.2 mg/dl (ref 0.2–1.0)

## 2012-05-10 LAB — URINE PREGNANCY TEST (POINT OF CARE): HCG QUALITATIVE URINE: NEGATIVE

## 2012-05-10 MED ORDER — CARBAMIDE PEROXIDE 6.5 % OT SOLN
5.00 [drp] | Freq: Two times a day (BID) | OTIC | Status: AC
Start: 2012-05-10 — End: 2012-05-14

## 2012-05-10 NOTE — Progress Notes (Signed)
Body mass index is 22.81 kg/(m^2).  Patient feels safe at home

## 2012-05-10 NOTE — Progress Notes (Addendum)
PRIMARY CARE CLINIC NOTE    CC:  Patient presents with:    Vaginal Problem - ?sore vs cut    Establish Care    Ear Problem - Forgein object in ear    HPI:  Julie Freeman is a 36 year old female here today    #vaginal problem.  In "past several months", has once a month of R sided pain "I think it's more external".  Unclear if it is a cut or infection.  Lasts for a few days to a week and then it returns. Stabbing pain when urinating.  Has not paid attn to whether it occurs in particular pattern to menses.  IUD placed Oct 2004 (Mirena) then replaced in 2009 (next change 2014).  LMP was "a couple weeks ago".  Q28 days for 5 days. Pain resolved 1 wk ago.      Sexually active.  Bisexual.  One man in past year (since Dec), but with "a couple of women" in past year (once in March, once in April, and once in May).  No barrier protection.  Denies h/o STD.  Last PAP in Nov 2012- normal- also HPV neg.      Denies unusual vaginal discharge nor malodor nor vaginal dryness.  Does not feel anything ususal on R side no redness nor swellling.    + dyuria and increased frequency and occ constipation but does not feel constipation related to her sx.  Denies fevers, chills, N/V, flank pain, abd pain, dysparenuria, douching, foreign object instrumentation..       #decreased L hearing.  Worries that she has her headphone earbud stuck in L ear since Sunday (she thought it externally flown out, but friend noted a foreign object there). Was nervous to try to remove it herself.      PMHx:  has a past medical history of Dermatomyositis and Shingles.    FHx:  family history is not on file.    SOC:        MEDS:  Current outpatient prescriptions:methotrexate 2.5 MG tablet, TAKE 10 TABLETS BY MOUTH ONCE A WEEK- in divided dosing, Disp: 40 tablet, Rfl: 3;  mycophenolate (CELLCEPT) 500 MG tablet, Take 3 tablets by mouth 2 (two) times daily., Disp: 180 tablet, Rfl: 3;  predniSONE (DELTASONE) 5 MG tablet, Take 1 tablet by mouth daily. Take  with 2.5mg  tablets as directed by MD., Disp: 30 tablet, Rfl: 3  predniSONE (DELTASONE) 2.5 MG tablet, Take 1 tablet by mouth daily. Use with 5mg  tablets for MD directed taper, Disp: 30 tablet, Rfl: 3;  clobetasol (TEMOVATE) 0.05 % external solution, Apply  topically 2 (two) times daily., Disp: 50 mL, Rfl: 5;  Cholecalciferol (VITAMIN D3) 1000 UNIT TABS, TAKE ONE (1) TABLET DAILY, Disp: 30 tablet, Rfl: 11  Calcium Carbonate-Vitamin D 600-400 MG-UNIT TABS Tablet, TAKE ONE (1) TABLET TWICE DAILY, Disp: 60 tablet, Rfl: 11;  multivitamin (THERAGRAN) per tablet, TAKE ONE (1) TABLET DAILY, Disp: 30 tablet, Rfl: 11;  mupirocin (BACTROBAN) 2 % ointment, APPLY TOPICALLY THREE TIMES DAILY, Disp: 30 g, Rfl: 11;  hydroxychloroquine (PLAQUENIL) 200 MG tablet, TAKE TWO (2) TABLETS DAILY., Disp: 60 tablet, Rfl: 11    PHYSICAL EXAMINATION:   05/10/12  1652   BP: 112/74   Height: 5\' 8"  (1.727 m)   Weight: 150 lb (68.04 kg)     Most Recent Weight Reading(s)  05/10/12 : 150 lb (68.04 kg)  03/03/12 : 153 lb (69.4 kg)  11/10/11 : 172 lb (78.019 kg)  09/10/11 : 159  lb (72.122 kg)  08/06/11 : 156 lb (70.761 kg)                Most Recent BP Reading(s)  05/10/12 : 112/74  03/03/12 : 115/71  11/10/11 : 122/73  08/06/11 : 110/74  05/04/11 : 118/70    Gen: NAD, alert, pleasant, answers questions appropriately, good hygiene   ENMT: L ear with rubber earbud lodged in auditory canal.  R ear impacted with cerumen.   CV: S1S2, No S3 or S4, No M/R/G, RRR  RESP: Lungs CTA B/L, No C/W/R  GI: SNT/ND, No HSM  Pelvic: external genitalia normal- no abrasions/lesions observed, non TTP of labia.  Lighter pink, moist, well rugated vaginal mucosa, IUD strings visualized.  No CMT nor adnexal tenderness on bimanual exam.   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 Urine dip not suggestive of infxn.  No abd pain, CVAT, adnexal/CMT on exam.  See comments/plan for vulvodynia below and also send for gen probe given multiple sexual parnters.  Plan: AMPLIFIED GENPROBE CHLAM/GC         (931) Foreign body in ear  Comment: Decreased hearing in L ear. Headphone ear bud lodged in L ear canal with normal TM post extraction  Plan: s/p extraction of headphone ear bud          (380.4) Impacted cerumen  Comment: R ear  Plan: debrox 5 drops BID x 4 days in R ear.        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  +dysuria and frequency. -fevers, chills, N/V, flank pain, abd pain, dysparenuria, unusual vag discharge nor malodor.  Normal physical exam (?lighter pink vaginal mucousa, but moist well rugated mucosa) and wet mount.  No lesions of external genitalia noted, but possible suspect microabrasions (other ddx: HSV, candida, dermatitis/dermatosis, constipation, manfiestation of systemic dz- has scleroderma/dermatomyositis vs referred pain).   Plan:  Watchful waiting. Advised pt to note any associated activites/sx with the vulvodynia (related to menses vs intercourse vs vaginal dryness vs constipation) and make appt to be seen when symptomatic.      (V65.49) Advance care planning  Plan: HEALTH CARE PROXY         F/up PRN (changed PCP from [facility] -> [facility] based on letter sent to pt by Dr. Melrose Nakayama)    --------------------------------------------------------------------------------------------------------------------------------  The patient indicates understanding of these issues and agrees with the plan.  The patient does not have any further questions at this time. I have reviewed the patient's medical, family, and social history in detail and updated the Epic record.

## 2012-05-10 NOTE — Progress Notes (Addendum)
PRECEPTOR NOTE - RESIDENT VISIT  S: Julie Freeman is a 36 year old female here for vaginal problem - resolved before appt  Going on labial pain (?cut or infection)  Lasts a few days and then returns  Does not know if association with menses  Last menstrual cycle was few days ago  Sexually active - bisexual - no douching    Ear bud from headphones stuck in ear  Decreased hearing on that side    PMH:  Scleroderma/dermatomyositis    O: see resident note    A/P: Labial pain  STD screen  Once a month - she gets it  Trauma - daily trauma vs. Sexual contacts vs. Infectious  Keep diary of symptoms  To come back when she has symptoms  Please see residents note for further details.  I personally examined the patient with the resident. I agree with the resident's history, physical and plan.

## 2012-05-11 DIAGNOSIS — N94819 Vulvodynia, unspecified: Secondary | ICD-10-CM | POA: Insufficient documentation

## 2012-05-11 DIAGNOSIS — T169XXA Foreign body in ear, unspecified ear, initial encounter: Secondary | ICD-10-CM | POA: Insufficient documentation

## 2012-05-11 DIAGNOSIS — H6120 Impacted cerumen, unspecified ear: Secondary | ICD-10-CM | POA: Insufficient documentation

## 2012-05-11 LAB — CHLAMYDIA GC NAAT
GENPROBE CHLAMYDIA: NEGATIVE
GENPROBE GC: NEGATIVE

## 2012-05-17 ENCOUNTER — Telehealth (HOSPITAL_BASED_OUTPATIENT_CLINIC_OR_DEPARTMENT_OTHER): Payer: Self-pay | Admitting: Internal Medicine

## 2012-06-02 ENCOUNTER — Ambulatory Visit (HOSPITAL_BASED_OUTPATIENT_CLINIC_OR_DEPARTMENT_OTHER): Payer: PRIVATE HEALTH INSURANCE | Admitting: Internal Medicine

## 2012-06-07 ENCOUNTER — Ambulatory Visit (HOSPITAL_BASED_OUTPATIENT_CLINIC_OR_DEPARTMENT_OTHER): Payer: PRIVATE HEALTH INSURANCE | Admitting: Internal Medicine

## 2012-06-07 VITALS — BP 128/67 | HR 70 | Ht 68.0 in | Wt 155.0 lb

## 2012-06-07 DIAGNOSIS — M3390 Dermatopolymyositis, unspecified, organ involvement unspecified: Secondary | ICD-10-CM

## 2012-06-07 DIAGNOSIS — M349 Systemic sclerosis, unspecified: Secondary | ICD-10-CM

## 2012-06-07 DIAGNOSIS — M3313 Other dermatomyositis without myopathy: Secondary | ICD-10-CM

## 2012-06-07 DIAGNOSIS — Z5181 Encounter for therapeutic drug level monitoring: Secondary | ICD-10-CM

## 2012-06-07 LAB — CBC WITH PLATELET
HEMATOCRIT: 32.3 % — ABNORMAL LOW (ref 34.1–44.9)
HEMOGLOBIN: 10.7 g/dL — ABNORMAL LOW (ref 11.2–15.7)
MEAN CORP HGB CONC: 33.1 g/dL (ref 31.0–37.0)
MEAN CORPUSCULAR HGB: 31.7 pg (ref 26.0–34.0)
MEAN CORPUSCULAR VOL: 95.6 fL (ref 80.0–100.0)
MEAN PLATELET VOLUME: 9.6 fL (ref 8.7–12.5)
PLATELET COUNT: 234 10*3/uL (ref 150–400)
RBC DISTRIBUTION WIDTH STD DEV: 44.2 fL (ref 35.1–46.3)
RBC DISTRIBUTION WIDTH: 12.8 % (ref 11.5–14.3)
RED BLOOD CELL COUNT: 3.38 M/uL — ABNORMAL LOW (ref 3.90–5.20)
WHITE BLOOD CELL COUNT: 4 10*3/uL (ref 4.0–11.0)

## 2012-06-07 LAB — CHG SEDIMENTATION RATE RBC NON-AUTOMATED: RBC SEDIMENTATION RATE: 11 MM/HR (ref 0–15)

## 2012-06-07 NOTE — Progress Notes (Signed)
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  She may have an overlap of scleroderma (some of her skin appears sclerodermatous).  She ha vitiligo.  She has areas of skin atrophy and discoid lupus lesions.  She had a malar rash and history of erythema nodosum.  ANA 1:640.  She has had fevers, aphthous ulcers, weakness, elevated CK, and eyelid erythema in the past.  She has been treated with IVIG, glucocorticoids, and rituximab.  She is currently on hydroxychloroquine, mycophenolate, methotrexate, and prednisone.  She has been tapering down her prednisone dose.  She is doing well overall.      She has been working out at [employer] once a week doing the bicycle.  She is concerned because her lower body is much larger than the upper body.  She feels that her legs are swollen.      PFTs in 2002 showed minimal obstructive pattern.  Last echocardiogram 2011.  She denies dyspnea, cough, and chest pain.  She denies unintentional weight loss.      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  She has a son who lives with his father but she sees on a weekly basis.  She works part-time at [employer] and is able to exercise once a week.  There is going to be a gym in her school, and she is excited about being able to do this.  She participates in [place] once a week and dances.  She wants to try to participate in some dance classes.    PAST MEDICAL HISTORY:   1.  Shingles on the face, complicated by cellulitis  2.  Breast lump, thought to be inflammatory in the past associated with her dermatomyositis  3.  Proteinuria during pregnancy  4.  Seasonal allergies  5.  Anemia  6.  Erythema nodosum    SOCIAL HISTORY:  No alcohol, no tobacco.    ALLERGIES:  No known drug allergies.    CURRENT MEDICATIONS:  Calcium, vitamin D, clobetasol external solution, folic acid, hydroxychloroquine, methotrexate,  multivitamin, mupirocin ointment, mycophenolate, prednisone.    PHYSICAL EXAMINATION:  VITAL SIGNS:  Height 5 feet 8 inches, weight is 155 pounds, BMI 24, blood pressure 128/67, pulse 70.  GENERAL:  She is in no acute distress.  She is alert and oriented.  No new rash on her face.  HEENT:  Anicteric sclerae.  Oropharynx clear.  NECK:  Supple.  LUNGS:  Clear.  EXTREMITIES:  There is no pitting edema, though the lower legs are larger.  There is some soft tissue swelling around the ankle, but it does not appear to be an effusion.  Skin is a little bit dry.  There are areas of erythema and atrophy.  No digital ulcerations.    LABORATORY DATA:  1.  WBC 4.4; hematocrit 34.4; platelets 240,000  2.  Creatinine 0.5, normal LFTs, albumin of 4.1  3.  CK 186  4.  Urinalysis without hematuria or proteinuria on July 30th  5.  Vitamin D 37.3  6.  Double-stranded DNA, Ro and La antibodies, anticardiolipin antibody, RNP, and Smith negative  7.  Beta 2 glycoprotein negative, CCP antibody negative, lupus anticoagulant negative    ASSESSMENT:  A 36 year old woman with dermatomyositis, discoid lupus, vitiligo, and skin atrophy.  She may have some sclerodermatous changes.    Continue with her immunosuppressive agent.  Surveillance  labs today.  Follow up with ophthalmology.  She was encouraged to exercise more.  I will see her back in 3 months, earlier if needed.    ___________________________  Reviewed and Electronically Signed By: Charlane Ferretti MD  Sig Date: 08/04/2012  Sig Time: 14:03:05  Dictated By: Charlane Ferretti MD  Dict Date: 06/07/2012 Dict Time: 04 29 PM    Dictation Date and Time:06/07/2012 16:29:58  Transcription Date and Time:06/07/2012 16:48:03  eScription Dictation id: [id number] Confirmation # :[id number]      cc: Bartholomew Boards MD    DICTATED BY: Charlane Ferretti MDSUZANNE L Omaya Nieland MDD:06/07/2012 16:29:58 T:06/07/2012 16:48:03 CN Job#: [id number]

## 2012-06-07 NOTE — Progress Notes (Signed)
The primary progress note for this visit has been dictated through E-Scription. It can be viewed as an attachment to this encounter or through Chart Review under the Other Tab as an Rheumatology Office Note.      Review of Systems: Constitutional, Eyes, ENT/Mouth, Cardiovascular, Respiratory, GI, GU, Neuro, Psych, Heme/Lymph, Skin, Musculoskeletal, and Endocrine systems were reviewed and are NEGATIVE, except for what is dictated in the note.

## 2012-06-09 LAB — CHG HEPATIC FUNCTION PANEL
ALANINE AMINOTRANSFERASE: 15 IU/L (ref 7–35)
ALBUMIN: 4 g/dl (ref 3.4–4.8)
ALKALINE PHOSPHATASE: 31 IU/L (ref 25–106)
ASPARTATE AMINOTRANSFERASE: 24 IU/L (ref 8–34)
BILIRUBIN DIRECT: 0.1 mg/dl (ref 0.0–0.2)
BILIRUBIN TOTAL: 0.7 mg/dl (ref 0.2–1.1)
INDIRECT BILIRUBIN: 0.6 mg/dl (ref 0.2–0.9)
TOTAL PROTEIN: 7.3 g/dl (ref 5.9–7.5)

## 2012-06-09 LAB — VITAMIN D,25 HYDROXY: VITAMIN D,25 HYDROXY: 44.9 ng/ml (ref 30.0–100.0)

## 2012-06-09 LAB — CHG CREATININE BLOOD: CREATININE: 0.4 mg/dl (ref 0.4–1.2)

## 2012-06-09 LAB — ESTIMATED GLOMERULAR FILT RATE: ESTIMATED GLOMERULAR FILT RATE: >60 mL/min (ref >60)

## 2012-06-09 LAB — CHG CREATINE KINASE TOTAL: CREATINE KINASE TOTAL: 215 IU/L (ref 21–215)

## 2012-06-14 ENCOUNTER — Encounter (HOSPITAL_BASED_OUTPATIENT_CLINIC_OR_DEPARTMENT_OTHER): Payer: Self-pay | Admitting: Internal Medicine

## 2012-06-17 ENCOUNTER — Ambulatory Visit (HOSPITAL_BASED_OUTPATIENT_CLINIC_OR_DEPARTMENT_OTHER): Payer: PRIVATE HEALTH INSURANCE | Admitting: Nurse Practitioner

## 2012-06-20 ENCOUNTER — Ambulatory Visit (HOSPITAL_BASED_OUTPATIENT_CLINIC_OR_DEPARTMENT_OTHER): Payer: PRIVATE HEALTH INSURANCE | Admitting: Ophthalmology

## 2012-06-24 ENCOUNTER — Ambulatory Visit (HOSPITAL_BASED_OUTPATIENT_CLINIC_OR_DEPARTMENT_OTHER): Payer: PRIVATE HEALTH INSURANCE

## 2012-07-12 ENCOUNTER — Other Ambulatory Visit (HOSPITAL_BASED_OUTPATIENT_CLINIC_OR_DEPARTMENT_OTHER): Payer: Self-pay | Admitting: Internal Medicine

## 2012-07-12 MED ORDER — MYCOPHENOLATE MOFETIL 500 MG PO TABS
1500.0000 mg | ORAL_TABLET | Freq: Two times a day (BID) | ORAL | Status: DC
Start: 2012-07-12 — End: 2012-07-14

## 2012-07-12 MED ORDER — METHOTREXATE 2.5 MG PO TABS
ORAL_TABLET | ORAL | Status: DC
Start: 2012-07-12 — End: 2012-07-14

## 2012-07-12 NOTE — Progress Notes (Signed)
.  LM for patient to let her know that prescriptions have been refilled and are ready for pickup

## 2012-07-13 ENCOUNTER — Telehealth (HOSPITAL_BASED_OUTPATIENT_CLINIC_OR_DEPARTMENT_OTHER): Payer: Self-pay | Admitting: Internal Medicine

## 2012-07-13 NOTE — Progress Notes (Signed)
.  LM for patient to call back re: pharmacy change.

## 2012-07-14 ENCOUNTER — Other Ambulatory Visit (HOSPITAL_BASED_OUTPATIENT_CLINIC_OR_DEPARTMENT_OTHER): Payer: Self-pay | Admitting: Internal Medicine

## 2012-07-14 DIAGNOSIS — M3390 Dermatopolymyositis, unspecified, organ involvement unspecified: Secondary | ICD-10-CM

## 2012-07-14 DIAGNOSIS — M3313 Other dermatomyositis without myopathy: Secondary | ICD-10-CM

## 2012-07-14 MED ORDER — MUPIROCIN 2 % EX OINT
TOPICAL_OINTMENT | CUTANEOUS | Status: DC
Start: 2012-07-14 — End: 2013-07-09

## 2012-07-14 MED ORDER — CALCIUM CARBONATE-VITAMIN D 600-400 MG-UNIT PO TABS
ORAL_TABLET | ORAL | Status: DC
Start: 2012-07-14 — End: 2013-06-14

## 2012-07-14 MED ORDER — METHOTREXATE 2.5 MG PO TABS
ORAL_TABLET | ORAL | Status: DC
Start: 2012-07-14 — End: 2012-10-24

## 2012-07-14 MED ORDER — HYDROXYCHLOROQUINE SULFATE 200 MG PO TABS
ORAL_TABLET | ORAL | Status: DC
Start: 2012-07-14 — End: 2013-07-09

## 2012-07-14 MED ORDER — PREDNISONE 2.5 MG PO TABS
2.5000 mg | ORAL_TABLET | Freq: Every day | ORAL | Status: DC
Start: 2012-07-14 — End: 2013-04-05

## 2012-07-14 MED ORDER — VITAMIN D3 25 MCG (1000 UT) PO TABS
ORAL_TABLET | ORAL | Status: DC
Start: 2012-07-14 — End: 2013-03-28

## 2012-07-14 MED ORDER — FOLIC ACID 1 MG PO TABS
ORAL_TABLET | ORAL | Status: DC
Start: 2012-07-14 — End: 2013-03-28

## 2012-07-14 MED ORDER — MYCOPHENOLATE MOFETIL 500 MG PO TABS
1500.0000 mg | ORAL_TABLET | Freq: Two times a day (BID) | ORAL | Status: DC
Start: 2012-07-14 — End: 2012-10-24

## 2012-07-14 MED ORDER — CLOBETASOL PROPIONATE 0.05 % EX SOLN
CUTANEOUS | Status: DC
Start: 2012-07-14 — End: 2013-02-09

## 2012-07-14 MED ORDER — PREDNISONE 5 MG PO TABS
5.0000 mg | ORAL_TABLET | Freq: Every day | ORAL | Status: DC
Start: 2012-07-14 — End: 2012-10-06

## 2012-07-14 NOTE — Progress Notes (Signed)
.  Spoke to patient to inform her that she has 4 medications ready to be picked up at the hospital pharmacy. If she wants to get them filled at [facility], [facility] need [facility] contact [facility]. Patient agreeable.

## 2012-08-25 ENCOUNTER — Ambulatory Visit (HOSPITAL_BASED_OUTPATIENT_CLINIC_OR_DEPARTMENT_OTHER): Payer: PRIVATE HEALTH INSURANCE | Admitting: Internal Medicine

## 2012-09-01 ENCOUNTER — Ambulatory Visit (HOSPITAL_BASED_OUTPATIENT_CLINIC_OR_DEPARTMENT_OTHER): Payer: PRIVATE HEALTH INSURANCE | Admitting: Internal Medicine

## 2012-09-02 ENCOUNTER — Ambulatory Visit (HOSPITAL_BASED_OUTPATIENT_CLINIC_OR_DEPARTMENT_OTHER): Payer: PRIVATE HEALTH INSURANCE | Admitting: Ophthalmology

## 2012-09-02 ENCOUNTER — Ambulatory Visit (HOSPITAL_BASED_OUTPATIENT_CLINIC_OR_DEPARTMENT_OTHER): Payer: PRIVATE HEALTH INSURANCE

## 2012-09-15 ENCOUNTER — Ambulatory Visit (HOSPITAL_BASED_OUTPATIENT_CLINIC_OR_DEPARTMENT_OTHER): Payer: PRIVATE HEALTH INSURANCE | Admitting: Internal Medicine

## 2012-10-06 ENCOUNTER — Ambulatory Visit (HOSPITAL_BASED_OUTPATIENT_CLINIC_OR_DEPARTMENT_OTHER): Payer: PRIVATE HEALTH INSURANCE | Admitting: Internal Medicine

## 2012-10-06 VITALS — BP 133/83 | HR 74 | Wt 150.0 lb

## 2012-10-06 DIAGNOSIS — M3313 Other dermatomyositis without myopathy: Secondary | ICD-10-CM

## 2012-10-06 DIAGNOSIS — M3390 Dermatopolymyositis, unspecified, organ involvement unspecified: Secondary | ICD-10-CM

## 2012-10-06 DIAGNOSIS — Z5181 Encounter for therapeutic drug level monitoring: Secondary | ICD-10-CM

## 2012-10-06 LAB — HEPATIC FUNCTION PANEL
ALANINE AMINOTRANSFERASE: 16 IU/L (ref 7–35)
ALBUMIN: 4.2 g/dl (ref 3.4–4.8)
ALKALINE PHOSPHATASE: 35 IU/L (ref 25–106)
ASPARTATE AMINOTRANSFERASE: 24 IU/L (ref 8–34)
BILIRUBIN DIRECT: 0.1 mg/dl (ref 0.0–0.2)
BILIRUBIN TOTAL: 0.9 mg/dl (ref 0.2–1.1)
INDIRECT BILIRUBIN: 0.8 mg/dl (ref 0.2–0.9)
TOTAL PROTEIN: 7.1 g/dl (ref 5.9–7.5)

## 2012-10-06 LAB — CBC WITH PLATELET
HEMATOCRIT: 35.1 % (ref 34.1–44.9)
HEMOGLOBIN: 11.8 g/dL (ref 11.2–15.7)
MEAN CORP HGB CONC: 33.6 g/dL (ref 31.0–37.0)
MEAN CORPUSCULAR HGB: 32.4 pg (ref 26.0–34.0)
MEAN CORPUSCULAR VOL: 96.4 fL (ref 80.0–100.0)
MEAN PLATELET VOLUME: 10 fL (ref 8.7–12.5)
PLATELET COUNT: 250 10*3/uL (ref 150–400)
RBC DISTRIBUTION WIDTH STD DEV: 44.2 fL (ref 35.1–46.3)
RBC DISTRIBUTION WIDTH: 12.8 % (ref 11.5–14.3)
RED BLOOD CELL COUNT: 3.64 M/uL — ABNORMAL LOW (ref 3.90–5.20)
WHITE BLOOD CELL COUNT: 4.9 10*3/uL (ref 4.0–11.0)

## 2012-10-06 LAB — CHG SEDIMENTATION RATE RBC NON-AUTOMATED: RBC SEDIMENTATION RATE: 11 MM/HR (ref 0–15)

## 2012-10-06 LAB — CREATININE: CREATININE: 0.4 mg/dl (ref 0.4–1.2)

## 2012-10-06 LAB — CREATINE KINASE TOTAL: CREATINE KINASE TOTAL: 151 IU/L (ref 21–215)

## 2012-10-06 LAB — ESTIMATED GLOMERULAR FILT RATE: ESTIMATED GLOMERULAR FILT RATE: >60 mL/min (ref >60)

## 2012-10-06 MED ORDER — PREDNISONE 1 MG PO TABS
2.0000 mg | ORAL_TABLET | Freq: Every day | ORAL | Status: DC
Start: 2012-10-06 — End: 2013-01-16

## 2012-10-06 NOTE — Progress Notes (Signed)
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  There is potential overlap with scleroderma given some of her skin changes look more sclerodermatous, and she has had digital ulcerations.  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  She has skin atrophy from prednisone use and from her dermatomyositis.  She has discoid lupus, diagnosed by biopsy.  She has a history of malar rash and a history of erythema nodosum.  ANA 1:640.  In the past she has had fevers, aphthous ulcers, weakness, elevated CK, eyelid erythema, and inflammatory breast mass that was thought to be part of dermatomyositis.  She was treated with IVIG, glucocorticoids, and rituximab.  Currently she is on hydroxychloroquine and mycophenolate, methotrexate, and prednisone 2.5 mg.  She is doing well.    She continues to exercise at the gym.  She just finished her first semester of her bachelor's degree in psychology.  She is doing well and is looking forward to next semester.    PAST MEDICAL HISTORY:  1.  History of shingles on the face complicated by a secondary cellulitis  2.  Breast lump, thought to be inflammatory in the past and related to her dermatomyositis  3.  Proteinuria  4.  Seasonal allergies  5.  Anemia  6.  Erythema nodosum    SOCIAL HISTORY:  No alcohol, no tobacco.    ALLERGIES AND ADVERSE REACTIONS:  No known drug allergies.    CURRENT MEDICATIONS:  Calcium, vitamin D, multivitamin, clobetasol, folic acid, hydroxychloroquine 400 mg, methotrexate 10 mg a week, multivitamin, topical mupirocin for her scalp, mycophenolate 1500 mg twice a day, and prednisone 2.5 mg.    PHYSICAL EXAMINATION:  VITAL SIGNS:  Weight 150 pounds, blood pressure 133/83, pulse 74, SpO2 of 100%, pain 2/10, BMI 23.  GENERAL:  She is no acute distress.  She is alert and oriented.  She wears a wig.  HEENT:  There is no acute malar rash.   She has chronic skin changes on the face.  Anicteric sclerae.  Oropharynx clear.  NECK:  Supple.  PULMONARY:  Lungs are clear.  No wheezes, rhonchi, or rales.  EXTREMITIES:  No pitting edema.  SKIN:  Thin skin on the hands with nail fold capillary changes visible with the naked eye.  There are skin changes over the PIPs and MCPs.  MUSCULOSKELETAL:  There is no synovitis.    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  She does not have any GI or pulmonary features of scleroderma.  In the past the skin on her hands has appeared very sclerodermatous.  Now it seems to be softening and less like sclerodactyly.    PLAN:  1.  Continue current regimen.  2.  Labs today.  3.  Every 6 weeks she will taper back her prednisone.  She will start with 2 mg, then 2 mg alternating with 1 mg, then 1 mg, then 1 mg alternating with 0.5 mg, 0.5 mg, then 0.5 mg every other day.  We discussed the risk of adrenal insufficiency.  If she has a stressor such as a major infection, surgery, or fever she may need  stress steroids.    I will see her back in 3 months, earlier if needed.    ___________________________  Reviewed and Electronically Signed By: Charlane Ferretti MD  Sig Date: 10/10/2012  Sig Time: 12:53:13  Dictated By: Charlane Ferretti MD  Dict Date: 10/06/2012 Dict Time: 04 34 PM    Dictation Date and Time:10/06/2012 16:34:22  Transcription Date and Time:10/06/2012 17:06:36  eScription Dictation id: [id number] Confirmation # :[id number]      cc: Sherre Scarlet MD    DICTATED BY: Charlane Ferretti MDSUZANNE L Elen Acero MDD:10/06/2012 16:34:22 T:10/06/2012 17:06:36 KN Job#: [id number]

## 2012-10-06 NOTE — Patient Instructions (Signed)
Prednisone   2mg  daily for 6 weeks.    2mg  alternating with 1mg  for 6 weeks.    1mg  daily for 6 weeks.    1mg  every other day or 0.5mg  daily for 6 weeks.    0.5mg  every other day for 6 weeks, then stop.

## 2012-10-06 NOTE — Progress Notes (Signed)
The primary progress note for this visit has been dictated through E-Scription. It can be viewed as an attachment to this encounter or through Chart Review under the Other Tab as an Rheumatology Office Note.      Review of Systems: Constitutional, Eyes, ENT/Mouth, Cardiovascular, Respiratory, GI, GU, Neuro, Psych, Heme/Lymph, Skin, Musculoskeletal, and Endocrine systems were reviewed and are NEGATIVE, except for what is dictated in the note.

## 2012-10-10 ENCOUNTER — Encounter (HOSPITAL_BASED_OUTPATIENT_CLINIC_OR_DEPARTMENT_OTHER): Payer: Self-pay | Admitting: Internal Medicine

## 2012-10-17 ENCOUNTER — Telehealth (HOSPITAL_BASED_OUTPATIENT_CLINIC_OR_DEPARTMENT_OTHER): Payer: Self-pay | Admitting: Rheumatology

## 2012-10-18 ENCOUNTER — Telehealth (HOSPITAL_BASED_OUTPATIENT_CLINIC_OR_DEPARTMENT_OTHER): Payer: Self-pay | Admitting: Internal Medicine

## 2012-10-18 NOTE — Progress Notes (Signed)
.  Spoke to pt to discuss her muprocin ointment. Requiring PA. Advised she will be updated as we have more information for her.

## 2012-10-24 ENCOUNTER — Other Ambulatory Visit (HOSPITAL_BASED_OUTPATIENT_CLINIC_OR_DEPARTMENT_OTHER): Payer: Self-pay | Admitting: Internal Medicine

## 2012-10-24 MED ORDER — METHOTREXATE 2.5 MG PO TABS
ORAL_TABLET | ORAL | Status: DC
Start: 2012-10-24 — End: 2012-11-14

## 2012-10-24 MED ORDER — MYCOPHENOLATE MOFETIL 500 MG PO TABS
1500.0000 mg | ORAL_TABLET | Freq: Two times a day (BID) | ORAL | Status: DC
Start: 2012-10-24 — End: 2012-11-14

## 2012-11-11 ENCOUNTER — Ambulatory Visit (HOSPITAL_BASED_OUTPATIENT_CLINIC_OR_DEPARTMENT_OTHER): Payer: PRIVATE HEALTH INSURANCE

## 2012-11-14 ENCOUNTER — Other Ambulatory Visit (HOSPITAL_BASED_OUTPATIENT_CLINIC_OR_DEPARTMENT_OTHER): Payer: Self-pay | Admitting: Internal Medicine

## 2012-11-14 MED ORDER — METHOTREXATE 2.5 MG PO TABS
ORAL_TABLET | ORAL | Status: DC
Start: 2012-11-14 — End: 2013-03-24

## 2012-11-14 MED ORDER — MYCOPHENOLATE MOFETIL 500 MG PO TABS
1500.0000 mg | ORAL_TABLET | Freq: Two times a day (BID) | ORAL | Status: DC
Start: 2012-11-14 — End: 2013-03-24

## 2013-01-05 ENCOUNTER — Ambulatory Visit (HOSPITAL_BASED_OUTPATIENT_CLINIC_OR_DEPARTMENT_OTHER): Payer: PRIVATE HEALTH INSURANCE | Admitting: Internal Medicine

## 2013-01-16 ENCOUNTER — Other Ambulatory Visit (HOSPITAL_BASED_OUTPATIENT_CLINIC_OR_DEPARTMENT_OTHER): Payer: Self-pay | Admitting: Internal Medicine

## 2013-01-17 MED ORDER — PREDNISONE 1 MG PO TABS
2.0000 mg | ORAL_TABLET | Freq: Every day | ORAL | Status: DC
Start: 2013-01-16 — End: 2013-04-05

## 2013-01-24 ENCOUNTER — Ambulatory Visit (HOSPITAL_BASED_OUTPATIENT_CLINIC_OR_DEPARTMENT_OTHER): Payer: Self-pay | Admitting: Internal Medicine

## 2013-01-24 ENCOUNTER — Ambulatory Visit (HOSPITAL_BASED_OUTPATIENT_CLINIC_OR_DEPARTMENT_OTHER): Payer: PRIVATE HEALTH INSURANCE | Admitting: Internal Medicine

## 2013-01-24 ENCOUNTER — Telehealth (HOSPITAL_BASED_OUTPATIENT_CLINIC_OR_DEPARTMENT_OTHER): Payer: Self-pay | Admitting: Internal Medicine

## 2013-01-24 VITALS — BP 108/70 | Wt 142.0 lb

## 2013-01-24 DIAGNOSIS — R350 Frequency of micturition: Secondary | ICD-10-CM

## 2013-01-24 DIAGNOSIS — A64 Unspecified sexually transmitted disease: Secondary | ICD-10-CM

## 2013-01-24 DIAGNOSIS — J019 Acute sinusitis, unspecified: Secondary | ICD-10-CM

## 2013-01-24 DIAGNOSIS — M3390 Dermatopolymyositis, unspecified, organ involvement unspecified: Secondary | ICD-10-CM

## 2013-01-24 DIAGNOSIS — M349 Systemic sclerosis, unspecified: Secondary | ICD-10-CM

## 2013-01-24 DIAGNOSIS — M3313 Other dermatomyositis without myopathy: Secondary | ICD-10-CM

## 2013-01-24 LAB — URINE DIP (POINT OF CARE)
BILIRUBIN, URINE: NEGATIVE
GLUCOSE, URINE: NEGATIVE mg/dl
KETONE, URINE: NEGATIVE mg/dl
LEUKOCYTE ESTERASE: NEGATIVE
NITRITE, URINE: NEGATIVE
PH URINE: 5.5 (ref 5.0–8.0)
PROTEIN, URINE: NEGATIVE mg/dl (ref 0–15)
SPECIFIC GRAVITY URINE: 1.02 (ref 1.003–1.030)
UROBILINOGEN URINE: 0.2 mg/dl (ref 0.2–1.0)

## 2013-01-24 LAB — CBC, PLATELET & DIFFERENTIAL
ABSOLUTE BASO COUNT: 0 10*3/uL (ref 0.0–0.1)
ABSOLUTE EOSINOPHIL COUNT: 0.1 10*3/uL (ref 0.0–0.8)
ABSOLUTE IMM GRAN COUNT: 0.02 10*3/uL (ref 0.00–0.03)
ABSOLUTE LYMPH COUNT: 0.9 10*3/uL (ref 0.6–5.9)
ABSOLUTE MONO COUNT: 0.8 10*3/uL (ref 0.2–1.4)
ABSOLUTE NEUTROPHIL COUNT: 4.9 10*3/uL (ref 1.6–8.3)
BASOPHIL %: 0.1 % (ref 0.0–1.2)
EOSINOPHIL %: 0.7 % (ref 0.0–7.0)
HEMATOCRIT: 36.8 % (ref 34.1–44.9)
HEMOGLOBIN: 12.5 g/dL (ref 11.2–15.7)
IMMATURE GRANULOCYTE %: 0.3 % (ref 0.0–0.4)
LYMPHOCYTE %: 13.5 % — ABNORMAL LOW (ref 15.0–54.0)
MEAN CORP HGB CONC: 34 g/dL (ref 31.0–37.0)
MEAN CORPUSCULAR HGB: 33.2 pg (ref 26.0–34.0)
MEAN CORPUSCULAR VOL: 97.6 fL (ref 80.0–100.0)
MEAN PLATELET VOLUME: 10.5 fL (ref 8.7–12.5)
MONOCYTE %: 11.7 % (ref 4.0–13.0)
NEUTROPHIL %: 73.7 % (ref 40.0–75.0)
PLATELET COUNT: 190 10*3/uL (ref 150–400)
RBC DISTRIBUTION WIDTH STD DEV: 46.8 fL — ABNORMAL HIGH (ref 35.1–46.3)
RBC DISTRIBUTION WIDTH: 13.2 % (ref 11.5–14.3)
RED BLOOD CELL COUNT: 3.77 M/uL — ABNORMAL LOW (ref 3.90–5.20)
WHITE BLOOD CELL COUNT: 6.7 10*3/uL (ref 4.0–11.0)

## 2013-01-24 MED ORDER — AMOXICILLIN-POT CLAVULANATE 250-125 MG PO TABS
1.00 | ORAL_TABLET | Freq: Three times a day (TID) | ORAL | Status: AC
Start: 2013-01-24 — End: 2013-02-03

## 2013-01-24 MED ORDER — FLUCONAZOLE 150 MG PO TABS
150.00 mg | ORAL_TABLET | Freq: Once | ORAL | Status: AC
Start: 2013-01-24 — End: 2013-01-24

## 2013-01-24 NOTE — Progress Notes (Signed)
.  Pt came into the office today reporting generalized fatigue and muscle pain. C/o a head cold that has been ongoing. Seeing PCP for cold today. Recently tapered down from 2mg  prednisone daily to 1mg  daily. Per Dr. Lenor Derrick, pt should r/o causes of achiness with PCP, and can increase prednisone to 2mg  daily, and call us after the weekend to let us know how she's feeling. Agrees to this plan. Will have CBC and CMP drawn with PCP visit today.

## 2013-01-24 NOTE — Progress Notes (Signed)
Julie Freeman is a 37 year old female who presents for sinusitis. Has had URI sx for two weeks. Now pain in face. No fever. No sore throat no ear pain.  Trying to taper prednisone. Down to 1 mg for past week. Thinks more muscular sx.  Some chronic urinary frequency. Never tested for infection. Occasional nocturia. No dysuria/hematuria.  Wants STD testing. Has f/u for CPE in June with Dr. Vear Clock      Current Outpatient Prescriptions on File Prior to Visit:  predniSONE (DELTASONE) 1 MG tablet Take 2 tablets by mouth daily. Disp: 60 tablet Rfl: 3   methotrexate 2.5 MG tablet TAKE 10 TABLETS BY MOUTH ONCE A WEEK- in divided dosing Disp: 40 tablet Rfl: 3   mycophenolate (CELLCEPT) 500 MG tablet Take 3 tablets by mouth 2 (two) times daily. Disp: 180 tablet Rfl: 3   clobetasol (TEMOVATE) 0.05 % external solution APPLY TOPICALLY TWO TIMES DAILY Disp: 50 mL Rfl: 3   folic acid (FOLVITE) 1 MG tablet TAKE 2 TABLET BY MOUTH DAILY. DOSE INCREASE. Disp: 60 tablet Rfl: 11   predniSONE (DELTASONE) 2.5 MG tablet Take 1 tablet by mouth daily. Use with 5mg  tablets for MD directed taper Disp: 30 tablet Rfl: 3   Cholecalciferol (VITAMIN D3) 1000 UNITS TABS Tablet TAKE ONE (1) TABLET DAILY Disp: 30 tablet Rfl: 11   Calcium Carbonate-Vitamin D 600-400 MG-UNIT TABS Tablet TAKE ONE (1) TABLET TWICE DAILY Disp: 60 tablet Rfl: 11   mupirocin (BACTROBAN) 2 % ointment APPLY TOPICALLY THREE TIMES DAILY Disp: 30 g Rfl: 11   hydroxychloroquine (PLAQUENIL) 200 MG tablet TAKE TWO (2) TABLETS DAILY. Disp: 60 tablet Rfl: 11   multivitamin (THERAGRAN) per tablet TAKE ONE (1) TABLET DAILY Disp: 30 tablet Rfl: 11     No current facility-administered medications on file prior to visit.  Review of Patient's Allergies indicates:  No Known Allergies  No family history on file.  Social History    Marital Status: Single              Spouse Name:                       Years of Education:                 Number of children:                Occupational History  Occupation          Associate Professor            Comment               unemployed                                  Social History Main Topics  Social History Narrative    Has 19 year old son, pervasive developmental disorder. Son socialable but "learns differently" DM type 1, peanut allergy.       BP 108/70   Wt 142 lb (64.411 kg)   BMI 21.6 kg/m2  NAD  Taut skin with thinning.  No facial tenderness to palpation.  TMS clear. oroph clear  Nasal mucose very erythematous with swollen turbinates.  The neck is supple and free of adenopathy or masses, the thyroid is normal without enlargement or nodules.  S1 and S2 normal, no murmurs, clicks, gallops or rubs. Regular rate and rhythm. Chest is clear; no wheezes  or rales. No edema or JVD.  No CVAT    (788.41) Urinary frequency  (primary encounter diagnosis)  Comment: will get urine to r/o UTI. 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 Instructed in use and side effects.   Plan: to call if not improving    (099.9) STD (female)  Comment: contact with results  Plan: HIV ANTIGEN ANTIBODY, TREPONEMA PALLIDUM         ANTIBODY (RPR), HEPATITIS C ANTIBODY              Allergies have been updated, medications have been reconciled and an up to date medication list has been provided to patient.

## 2013-01-25 LAB — HEMOGLOBIN A1C
ESTIMATED AVERAGE GLUCOSE: 97 (ref 74–160)
HEMOGLOBIN A1C: 5 % (ref 4.0–5.6)

## 2013-01-25 NOTE — Progress Notes (Signed)
This encounter was opened in error.  Please disregard.

## 2013-01-26 LAB — COMP METABOLIC PANEL, FASTING
ALANINE AMINOTRANSFERASE: 22 U/L (ref 12–45)
ALBUMIN: 4.2 g/dL (ref 3.4–5.0)
ALKALINE PHOSPHATASE: 44 U/L — ABNORMAL LOW (ref 45–117)
ANION GAP: 8 mmol/L (ref 5–15)
ASPARTATE AMINOTRANSFERASE: 23 U/L (ref 8–34)
BILIRUBIN TOTAL: 1 mg/dL (ref 0.2–1.0)
BUN (UREA NITROGEN): 13 mg/dL (ref 7–18)
CALCIUM: 8.6 mg/dL (ref 8.5–10.1)
CARBON DIOXIDE: 28 mmol/L (ref 21–32)
CHLORIDE: 100 mmol/L (ref 98–107)
CREATININE: 0.4 mg/dL (ref 0.4–1.2)
ESTIMATED GLOMERULAR FILT RATE: >60 mL/min (ref >60)
GLUCOSE FASTING: 87 mg/dL (ref 74–106)
POTASSIUM: 3.6 mmol/L (ref 3.5–5.1)
SODIUM: 136 mmol/L (ref 136–145)
TOTAL PROTEIN: 7.7 g/dL (ref 6.4–8.2)

## 2013-01-26 LAB — HIV ANTIGEN ANTIBODY: HIV 1/2 PLUS O AG/AB SCREEN: NONREACTIVE

## 2013-01-26 LAB — HIGH DENSITY LIPOPROTEIN: HIGH DENSITY LIPOPROTEIN: 63 mg/dL — ABNORMAL HIGH (ref 40–60)

## 2013-01-26 LAB — CHOLESTEROL: Cholesterol: 124 mg/dL (ref 0–239)

## 2013-01-26 LAB — TREPONEMA PALLIDUM AB IGG: TREPONEMA PALLIDUM AB IgG: NONREACTIVE

## 2013-01-26 LAB — HEPATITIS C ANTIBODY: HEPATITIS C ANTIBODY: NEGATIVE

## 2013-01-26 LAB — LOW DENSITY LIPOPROTEIN DIRECT: LOW DENSITY LIPOPROTEIN DIRECT: 51 mg/dL (ref 0–189)

## 2013-02-09 ENCOUNTER — Telehealth (HOSPITAL_BASED_OUTPATIENT_CLINIC_OR_DEPARTMENT_OTHER): Payer: Self-pay | Admitting: Internal Medicine

## 2013-02-09 ENCOUNTER — Other Ambulatory Visit (HOSPITAL_BASED_OUTPATIENT_CLINIC_OR_DEPARTMENT_OTHER): Payer: Self-pay | Admitting: ESP Geriatrics

## 2013-02-09 MED ORDER — CLOBETASOL PROPIONATE 0.05 % EX SOLN
CUTANEOUS | Status: DC
Start: 2013-02-09 — End: 2013-09-26

## 2013-02-09 NOTE — Progress Notes (Signed)
Left v/mail for patient to inform appt on 03/13/2013 has been canceled with Dr Lenor Derrick.Awaiting call back to reschedule .

## 2013-03-10 ENCOUNTER — Ambulatory Visit (HOSPITAL_BASED_OUTPATIENT_CLINIC_OR_DEPARTMENT_OTHER): Payer: PRIVATE HEALTH INSURANCE

## 2013-03-13 ENCOUNTER — Ambulatory Visit (HOSPITAL_BASED_OUTPATIENT_CLINIC_OR_DEPARTMENT_OTHER): Payer: PRIVATE HEALTH INSURANCE | Admitting: Internal Medicine

## 2013-03-14 ENCOUNTER — Ambulatory Visit (HOSPITAL_BASED_OUTPATIENT_CLINIC_OR_DEPARTMENT_OTHER): Payer: PRIVATE HEALTH INSURANCE | Admitting: Internal Medicine

## 2013-03-24 ENCOUNTER — Ambulatory Visit (HOSPITAL_BASED_OUTPATIENT_CLINIC_OR_DEPARTMENT_OTHER): Payer: PRIVATE HEALTH INSURANCE | Admitting: Internal Medicine

## 2013-03-24 VITALS — BP 122/74 | HR 80 | Ht 68.0 in | Wt 147.0 lb

## 2013-03-24 DIAGNOSIS — M3313 Other dermatomyositis without myopathy: Secondary | ICD-10-CM

## 2013-03-24 DIAGNOSIS — M25561 Pain in right knee: Secondary | ICD-10-CM

## 2013-03-24 DIAGNOSIS — M25562 Pain in left knee: Secondary | ICD-10-CM

## 2013-03-24 DIAGNOSIS — M3390 Dermatopolymyositis, unspecified, organ involvement unspecified: Secondary | ICD-10-CM

## 2013-03-24 LAB — HEPATIC FUNCTION PANEL
ALANINE AMINOTRANSFERASE: 23 U/L (ref 12–45)
ALBUMIN: 3.9 g/dL (ref 3.4–5.0)
ALKALINE PHOSPHATASE: 35 U/L — ABNORMAL LOW (ref 45–117)
ASPARTATE AMINOTRANSFERASE: 18 U/L (ref 8–34)
BILIRUBIN DIRECT: 0.2 mg/dl (ref 0.0–0.2)
BILIRUBIN TOTAL: 0.6 mg/dL (ref 0.2–1.0)
INDIRECT BILIRUBIN: 0.4 mg/dL (ref 0.2–0.9)
TOTAL PROTEIN: 6.8 g/dL (ref 6.4–8.2)

## 2013-03-24 LAB — CREATINE KINASE TOTAL: CREATINE KINASE TOTAL: 170 IU/L (ref 26–192)

## 2013-03-24 LAB — CBC WITH PLATELET
HEMATOCRIT: 32.6 % — ABNORMAL LOW (ref 34.1–44.9)
HEMOGLOBIN: 10.9 g/dL — ABNORMAL LOW (ref 11.2–15.7)
MEAN CORP HGB CONC: 33.4 g/dL (ref 31.0–37.0)
MEAN CORPUSCULAR HGB: 32.6 pg (ref 26.0–34.0)
MEAN CORPUSCULAR VOL: 97.6 fL (ref 80.0–100.0)
MEAN PLATELET VOLUME: 9.8 fL (ref 8.7–12.5)
PLATELET COUNT: 230 10*3/uL (ref 150–400)
RBC DISTRIBUTION WIDTH STD DEV: 46.3 fL (ref 35.1–46.3)
RBC DISTRIBUTION WIDTH: 13.2 % (ref 11.5–14.3)
RED BLOOD CELL COUNT: 3.34 M/uL — ABNORMAL LOW (ref 3.90–5.20)
WHITE BLOOD CELL COUNT: 4.2 10*3/uL (ref 4.0–11.0)

## 2013-03-24 LAB — CREATININE: CREATININE: 0.2 mg/dL — ABNORMAL LOW (ref 0.4–1.2)

## 2013-03-24 LAB — VITAMIN D,25 HYDROXY: VITAMIN D,25 HYDROXY: 27 ng/mL — ABNORMAL LOW (ref 30.0–100.0)

## 2013-03-24 LAB — ESTIMATED GLOMERULAR FILT RATE: ESTIMATED GLOMERULAR FILT RATE: >60 mL/min (ref >60)

## 2013-03-24 MED ORDER — MYCOPHENOLATE MOFETIL 500 MG PO TABS
1500.0000 mg | ORAL_TABLET | Freq: Two times a day (BID) | ORAL | Status: DC
Start: 2013-03-24 — End: 2013-08-21

## 2013-03-24 MED ORDER — METHOTREXATE 2.5 MG PO TABS
ORAL_TABLET | ORAL | Status: DC
Start: 2013-03-24 — End: 2013-06-14

## 2013-03-24 NOTE — Progress Notes (Signed)
The primary progress note for this visit has been dictated through E-Scription. It can be viewed as an attachment to this encounter or through Chart Review under the Other Tab as an Rheumatology Office Note.      Review of Systems: Constitutional, Eyes, ENT/Mouth, Cardiovascular, Respiratory, GI, GU, Neuro, Psych, Heme/Lymph, Skin, Musculoskeletal, and Endocrine systems were reviewed and are NEGATIVE, except for what is dictated in the note.

## 2013-03-24 NOTE — Progress Notes (Signed)
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      She takes 2 mg of prednisone, mycophenolate 1500 mg twice a day, methotrexate 10 mg a week and hydroxychloroquine 400 mg.      She describes acute onset of bilateral knee pain without swelling x 1 day that is resolving.    Occasionally, she has achiness in the knees.      She describes a little bump on the right quadriceps muscle that is not tender.    PAST MEDICAL HISTORY:    Shingles on the face, complicated by cellulitis  Breast lump, thought to be related to dermatomyositis  Proteinuria  Seasonal allergies  Anemia  Erythema nodosum    SOCIAL HISTORY:  No alcohol, no tobacco.  She has a son.  She has a girlfriend.  She is in school and working as a Counsellor.      CURRENT MEDICATIONS:  Calcium, vitamin D, clobetasol solution, folic acid, hydroxychloroquine, methotrexate, multivitamin, mupirocin ointment for the scalp, mycophenolate, prednisone.    PHYSICAL EXAMINATION:  VITAL SIGNS:  Height 5 feet 8 inches, weight is 147 pounds, BMI 22.  Blood pressure 122/74, pulse 80, SpO2 100%.  Pain 2/10.  GENERAL:  She is no acute distress.  She is alert and oriented.    HEENT:  No malar rash.  Anicteric sclerae.  Oropharynx clear.  NECK:  Supple.  LUNGS:  Clear.  EXTREMITIES:  No edema.  SKIN:  Hyper- and hypopigmented areas throughout with areas of erythema.  MUSCULOSKELETAL:  There is no synovitis, no tender joints on examination.  Knees are not tender today, and there are no effusions.  The right quadriceps has a slight irregularity on palpation, which feels like it may be a vein.      LABORATORY DATA:    April 2014:  WBC 6.7; hematocrit 37; platelets 190,000  A1c 5  Creatinine 0.4, GFR greater than 60  LFTs are not elevated, albumin 4.2  CK 151  Vitamin D 45    ASSESSMENT:  A 37 year old woman with dermatomyositis and knee pain.      PLAN:    1.  I  showed her exercises to strengthen the muscles around the knees.   2.  Dermatomyositis:  Continue current regimen, except she will try to taper back her prednisone to 1 mg. If she is unable to do this, I will slow down the taper.  Surveillance labs today.    3.  Slight bump in her quadriceps muscle is not concerning, and she was given reassurance today.    4.  I will see her back in 3 months, earlier if needed.    ___________________________  Reviewed and Electronically Signed By: Charlane Ferretti MD  Sig Date: 02/01/2014  Sig Time: 20:52:34  Dictated By: Charlane Ferretti MD  Dict Date: 03/24/2013 Dict Time: 11 15 AM    Dictation Date and Time:03/24/2013 11:15:35  Transcription Date and Time:03/24/2013 12:25:14  eScription Dictation id: [id number] Confirmation # :[id number]      cc: Sherre Scarlet MD

## 2013-03-28 ENCOUNTER — Other Ambulatory Visit (HOSPITAL_BASED_OUTPATIENT_CLINIC_OR_DEPARTMENT_OTHER): Payer: Self-pay | Admitting: Internal Medicine

## 2013-03-28 ENCOUNTER — Telehealth (HOSPITAL_BASED_OUTPATIENT_CLINIC_OR_DEPARTMENT_OTHER): Payer: Self-pay | Admitting: ESP Geriatrics

## 2013-03-28 ENCOUNTER — Encounter (HOSPITAL_BASED_OUTPATIENT_CLINIC_OR_DEPARTMENT_OTHER): Payer: Self-pay | Admitting: Internal Medicine

## 2013-03-28 DIAGNOSIS — M349 Systemic sclerosis, unspecified: Secondary | ICD-10-CM

## 2013-03-28 MED ORDER — FOLIC ACID 1 MG PO TABS
3.0000 mg | ORAL_TABLET | Freq: Every day | ORAL | Status: DC
Start: 2013-03-28 — End: 2013-03-28

## 2013-03-28 MED ORDER — VITAMIN D 50 MCG (2000 UT) PO CAPS
1.0000 | ORAL_CAPSULE | Freq: Every day | ORAL | Status: DC
Start: 2013-03-28 — End: 2014-01-23

## 2013-03-28 MED ORDER — FOLIC ACID 1 MG PO TABS
3.0000 mg | ORAL_TABLET | Freq: Every day | ORAL | Status: DC
Start: 2013-03-28 — End: 2014-01-23

## 2013-03-28 NOTE — Telephone Encounter (Signed)
Message copied by Johnell Comings on Tue Mar 28, 2013  3:13 PM  ------       Message from: Ardean Larsen       Created: Tue Mar 28, 2013  1:20 PM       Regarding: chapnick       Contact: [phone]                       Rakisha Pincock [id number], 37 year old, female, Telephone Information:       Home Phone      [phone]       Work Phone      [phone]       Mobile          [phone]                     Patient's Preferred Pharmacy:               [id number] [name], [place] - [address]       Phone: [phone] Fax: [phone]                     CONFIRMED TODAY: Yes              CALL BACK NUMBER: [phone]       Best time to call back: asap       Cell phone:        Other phone:              Available times:              Patient's language of care: English              Patient does not need an interpreter.              Patient's PCP: Betsey Holiday. Melrose Nakayama, MD              Person calling on behalf of patient: Pharmacy              Calls today for med refill(s).       to speak to nurse only.       with questions and concerns.                       ------

## 2013-03-28 NOTE — Progress Notes (Signed)
Spoke with Lanora Manis, from [facility], who wanted to confer that the Folic Acid 1 mg po, read take 2 tabs po daily, which was written previously on last script.  Will consult with provider.

## 2013-03-28 NOTE — Progress Notes (Signed)
ASSESSMENT: A 37 year old woman with dermatomyositis and knee pain.   PLAN:   1. I showed her exercises to strengthen the muscles around the knees.   2. Dermatomyositis: Continue current regimen, except she will try to taper back her prednisone to 1 mg. If she is unable to do this, I will slow down the taper. Surveillance labs today.   3. Slight bump in her quadriceps muscle is not concerning, and she was given reassurance today.   4. I will see her back in 3 months, earlier if needed.   Dictation Date and Time:03/24/2013 11:15:35   Transcription Date and Time:03/24/2013 12:25:14   eScription Dictation id: [id number] Confirmation # :[id number]

## 2013-03-30 ENCOUNTER — Ambulatory Visit (HOSPITAL_BASED_OUTPATIENT_CLINIC_OR_DEPARTMENT_OTHER): Payer: PRIVATE HEALTH INSURANCE | Admitting: Internal Medicine

## 2013-04-03 ENCOUNTER — Ambulatory Visit (HOSPITAL_BASED_OUTPATIENT_CLINIC_OR_DEPARTMENT_OTHER): Payer: PRIVATE HEALTH INSURANCE

## 2013-04-03 DIAGNOSIS — Z79899 Other long term (current) drug therapy: Secondary | ICD-10-CM

## 2013-04-03 NOTE — Nursing Note (Signed)
>>   Julie Freeman     Tue Apr 04, 2013  8:51 AM  Pt is here for VF test

## 2013-04-05 ENCOUNTER — Ambulatory Visit (HOSPITAL_BASED_OUTPATIENT_CLINIC_OR_DEPARTMENT_OTHER): Payer: PRIVATE HEALTH INSURANCE | Admitting: Internal Medicine

## 2013-04-05 VITALS — BP 142/76 | Ht 68.0 in | Wt 143.0 lb

## 2013-04-05 DIAGNOSIS — R350 Frequency of micturition: Secondary | ICD-10-CM

## 2013-04-05 DIAGNOSIS — M3390 Dermatopolymyositis, unspecified, organ involvement unspecified: Secondary | ICD-10-CM

## 2013-04-05 DIAGNOSIS — M3313 Other dermatomyositis without myopathy: Secondary | ICD-10-CM

## 2013-04-05 LAB — URINE DIP (POINT OF CARE)
BILIRUBIN, URINE: NEGATIVE
GLUCOSE, URINE: NEGATIVE mg/dl
KETONE, URINE: NEGATIVE mg/dl
LEUKOCYTE ESTERASE: NEGATIVE
NITRITE, URINE: NEGATIVE
PH URINE: 6.5 (ref 5.0–8.0)
PROTEIN, URINE: 30 mg/dl — AB (ref 0–15)
SPECIFIC GRAVITY URINE: >1.03 (ref 1.003–1.030)
UROBILINOGEN URINE: 1 mg/dl (ref 0.2–1.0)

## 2013-04-05 MED ORDER — PREDNISONE 1 MG PO TABS
ORAL_TABLET | ORAL | Status: DC
Start: 2013-04-05 — End: 2013-04-05

## 2013-04-05 NOTE — Progress Notes (Signed)
PRECEPTOR NOTE - RESIDENT VISIT  S: Julie Freeman is a 37 year old female here to meet new provide c/o urinary frequency; no dysuria; has been intermittent for long time  Sometimes every hour, sometimes every 2-3 hrs; sometimes incomplete emptying; sleeps through the night      Past Medical History    Dermatomyositis     Shingles        O: NAD BP 142/76  Ht 5\' 8"  (1.727 m)  Wt 143 lb (64.864 kg)  BMI 21.75 kg/m2  No CVAT    A/P: chronic intermittent frequent urination with occ urge incontinence.  Consider PVF check-  Voiding diary  Ua, c&S, consider GU consult  Please see residents note for further details.

## 2013-04-05 NOTE — Progress Notes (Signed)
Julie Freeman is a 37 year old female here for new patient visit.  Was followed by Dr. Melrose Nakayama.  Followed closely by Dr. Lenor Derrick for dematomyositis.  HM up to date.    #Frequent Urination/Urge Incontinence  - ongoing for several years ~5 years  - sometimes every hour, other time every other hour  - when has BM feels like she can't completely empty her bladder  - worse when she is nervous or stressed  - urine output has varying amounts  - if waits too long to void, has some urge incontinence  - no dysuria  - no problems with BM  - no increased thirst  - sometimes can hold her urine for for night, other times wakes up 1-2 times per night  - no stress incontinence  - currently sexually active with females  - UA at last visit was negative, but notable for hematuria  - normal menstrual cycles  - last PAP in 2012 wnl  - GC Chlamydia wnl    Meds Reviewed, include:  Cholecalciferol (VITAMIN D) 2000 UNITS CAPS Take 1 capsule by mouth daily. Disp: 30 capsule Rfl: 11   folic acid (FOLVITE) 1 MG tablet Take 3 tablets by mouth daily. Dose increase Disp: 90 tablet Rfl: 11   methotrexate 2.5 MG tablet TAKE 10 TABLETS BY MOUTH ONCE A WEEK- in divided dosing Disp: 40 tablet Rfl: 3   mycophenolate (CELLCEPT) 500 MG tablet Take 3 tablets by mouth 2 (two) times daily. Disp: 180 tablet Rfl: 3   clobetasol (TEMOVATE) 0.05 % external solution APPLY TOPICALLY TWO TIMES DAILY Disp: 50 mL Rfl: 3   [DISCONTINUED] predniSONE (DELTASONE) 1 MG tablet Take 2 tablets by mouth daily. Disp: 60 tablet Rfl: 3   Calcium Carbonate-Vitamin D 600-400 MG-UNIT TABS Tablet TAKE ONE (1) TABLET TWICE DAILY Disp: 60 tablet Rfl: 11   mupirocin (BACTROBAN) 2 % ointment APPLY TOPICALLY THREE TIMES DAILY Disp: 30 g Rfl: 11   hydroxychloroquine (PLAQUENIL) 200 MG tablet TAKE TWO (2) TABLETS DAILY. Disp: 60 tablet Rfl: 11   multivitamin (THERAGRAN) per tablet TAKE ONE (1) TABLET DAILY Disp: 30 tablet Rfl: 11       BP 142/76  Ht 5\' 8"  (1.727 m)  Wt 143 lb (64.864  kg)  BMI 21.75 kg/m2  Gen: pleasant female, chronically ill  HEENT: chronic skin changes on face, anicteric sclera.  Mmm  Neck: no thyromegaly  Lungs: clear bilaterally  Heart: RRR, no m/r/g  Ext: lower extremities much larger than upper extremities, chronic, trace pitting edema, equal bilaterally    Labs reviewed  A1C 5 in April 2014  Vit D 27  HIV, Hep C negative  LDL 51    A/P    (788.41) Urinary frequency  (primary encounter diagnosis)  Comment: with urge incontinence.  Symptoms are chronic and don't appear to be acutely worsening.  UA/urine Cx to rule out UTI.  Has both symptoms of possible overactive bladder and urinary retention (incomplete emptying).  Absence of nocturia is somewhat reassuring as symptoms only noticeable when patient is awake.  No increased thirst, no evidence of DM.  A quick review of medications does reveal obvious medication causes but will research the side effects of mycophenolate mofetil and hydroxychloroquine.  Will start with voiding diary, kegels.  Patient to return in 1 month for follow up  Plan:   - URINE CULTURE, UA  - voiding diary  - RTC in 1 months  - consider urogyn consult at that time  RTC in 1 month

## 2013-04-05 NOTE — Progress Notes (Signed)
Body mass index is 21.75 kg/(m^2).

## 2013-04-06 LAB — URINE CULTURE

## 2013-04-12 ENCOUNTER — Ambulatory Visit (HOSPITAL_BASED_OUTPATIENT_CLINIC_OR_DEPARTMENT_OTHER): Payer: PRIVATE HEALTH INSURANCE | Admitting: Internal Medicine

## 2013-05-09 ENCOUNTER — Ambulatory Visit (HOSPITAL_BASED_OUTPATIENT_CLINIC_OR_DEPARTMENT_OTHER): Payer: PRIVATE HEALTH INSURANCE | Admitting: Ophthalmology

## 2013-05-11 ENCOUNTER — Ambulatory Visit (HOSPITAL_BASED_OUTPATIENT_CLINIC_OR_DEPARTMENT_OTHER): Payer: PRIVATE HEALTH INSURANCE | Admitting: Ophthalmology

## 2013-05-15 ENCOUNTER — Ambulatory Visit (HOSPITAL_BASED_OUTPATIENT_CLINIC_OR_DEPARTMENT_OTHER): Payer: PRIVATE HEALTH INSURANCE | Admitting: Internal Medicine

## 2013-06-14 ENCOUNTER — Other Ambulatory Visit (HOSPITAL_BASED_OUTPATIENT_CLINIC_OR_DEPARTMENT_OTHER): Payer: Self-pay | Admitting: ESP Geriatrics

## 2013-06-14 MED ORDER — METHOTREXATE 2.5 MG PO TABS
ORAL_TABLET | ORAL | Status: DC
Start: 2013-06-14 — End: 2013-07-09

## 2013-06-14 MED ORDER — CALCIUM CARBONATE-VITAMIN D 600-400 MG-UNIT PO TABS
ORAL_TABLET | ORAL | Status: DC
Start: 2013-06-14 — End: 2014-05-12

## 2013-06-14 NOTE — Telephone Encounter (Signed)
Message copied by Venita Sheffield on Wed Jun 14, 2013 12:39 PM  ------       Message from: Ardean Larsen       Created: Wed Jun 14, 2013 11:03 AM       Regarding: chapnick       Contact: [phone]         [facility]              Person calling on behalf of patient: Patient (self)              May list multiple medications in this section              Medicine Name: methotrexate 2.5 MG tablet              Frequency (how many pills, how many times a day): TAKE 10 TABLETS BY MOUTH ONCE A WEEK- in divided dosing              Number of pills left: 0              Documented patient preferred pharmacies:        [id number] [name], [place] - [address]       Phone: [phone] Fax: [phone]                     Medicine Name: Cholecalciferol (VITAMIN D) 2000 UNITS CAPS                     Frequency (how many pills, how many times a day): Take 1 capsule by mouth daily. - Oral              Number of pills left: 0                       ------

## 2013-06-26 ENCOUNTER — Ambulatory Visit (HOSPITAL_BASED_OUTPATIENT_CLINIC_OR_DEPARTMENT_OTHER): Payer: PRIVATE HEALTH INSURANCE | Admitting: Internal Medicine

## 2013-06-29 ENCOUNTER — Encounter (HOSPITAL_BASED_OUTPATIENT_CLINIC_OR_DEPARTMENT_OTHER): Payer: Self-pay | Admitting: Internal Medicine

## 2013-06-29 ENCOUNTER — Ambulatory Visit (HOSPITAL_BASED_OUTPATIENT_CLINIC_OR_DEPARTMENT_OTHER): Payer: PRIVATE HEALTH INSURANCE | Admitting: Internal Medicine

## 2013-06-29 VITALS — BP 120/80 | HR 68 | Ht 68.0 in | Wt 140.0 lb

## 2013-06-29 VITALS — BP 120/80 | Wt 140.0 lb

## 2013-06-29 DIAGNOSIS — M3390 Dermatopolymyositis, unspecified, organ involvement unspecified: Secondary | ICD-10-CM

## 2013-06-29 DIAGNOSIS — Z23 Encounter for immunization: Secondary | ICD-10-CM

## 2013-06-29 DIAGNOSIS — IMO0001 Reserved for inherently not codable concepts without codable children: Secondary | ICD-10-CM

## 2013-06-29 DIAGNOSIS — R768 Other specified abnormal immunological findings in serum: Secondary | ICD-10-CM

## 2013-06-29 DIAGNOSIS — R319 Hematuria, unspecified: Secondary | ICD-10-CM

## 2013-06-29 DIAGNOSIS — M349 Systemic sclerosis, unspecified: Secondary | ICD-10-CM

## 2013-06-29 DIAGNOSIS — R7689 Other specified abnormal immunological findings in serum: Secondary | ICD-10-CM

## 2013-06-29 DIAGNOSIS — R1310 Dysphagia, unspecified: Secondary | ICD-10-CM

## 2013-06-29 DIAGNOSIS — M3313 Other dermatomyositis without myopathy: Secondary | ICD-10-CM

## 2013-06-29 DIAGNOSIS — R350 Frequency of micturition: Secondary | ICD-10-CM

## 2013-06-29 LAB — URINE DIP (POINT OF CARE)
BILIRUBIN, URINE: NEGATIVE
GLUCOSE, URINE: NEGATIVE mg/dl
KETONE, URINE: NEGATIVE mg/dl
LEUKOCYTE ESTERASE: NEGATIVE
NITRITE, URINE: NEGATIVE
OCCULT BLOOD, URINE: NEGATIVE
PH URINE: 7 (ref 5.0–8.0)
PROTEIN, URINE: NEGATIVE mg/dl (ref 0–15)
SPECIFIC GRAVITY URINE: 1.02 (ref 1.003–1.030)
UROBILINOGEN URINE: 0.2 mg/dl (ref 0.2–1.0)

## 2013-06-29 LAB — URINALYSIS
BILIRUBIN, URINE: NEGATIVE
GLUCOSE, URINE: NEGATIVE MG/DL
KETONE, URINE: NEGATIVE MG/DL
LEUKOCYTE ESTERASE: NEGATIVE
NITRITE, URINE: NEGATIVE
OCCULT BLOOD, URINE: NEGATIVE
PH URINE: 7 (ref 5.0–8.0)
PROTEIN, URINE: NEGATIVE MG/DL
SPECIFIC GRAVITY URINE: 1.015 (ref 1.003–1.035)

## 2013-06-29 NOTE — Progress Notes (Signed)
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   - brings in voiding diary from July which shows: small voids (3-5oz) every 2 hours during the day, able to sleep 6 hours at night without voiding  - this has been going on for years - never thought to speak to doctor about this but now it's causing distress at work and home  - no incontinence  - G1P1 s/p vaginal birth 10 years ago  - no dysuria  - no recent instrumentation  - LMP - 06/21/13, regular  - last PAP 2012 wnl  - no vaginal discharge or vaginal bleeding  - sexually active with females  - has IUD which is due for a change in one month  - Repeat UA today without hematuria or proteinuria - negative for infection    BP 120/80  Wt 140 lb (63.504 kg)  BMI 21.29 kg/m2  LMP 06/27/2013        Physical Exam   Constitutional:   Thin appearing female, skin changes consistent with dermatomyositis.     Cardiovascular: Normal rate and regular rhythm.    Pulmonary/Chest: Effort normal and breath sounds normal.   Genitourinary: Vagina normal and uterus normal. There is no rash, tenderness or lesion on the right labia. There is no rash, tenderness or lesion on the left labia. Cervix exhibits no motion tenderness and no discharge. Right adnexum displays no mass and no tenderness. Left adnexum displays no mass and no tenderness. No tenderness or bleeding around the vagina. No vaginal discharge found.   Musculoskeletal:   Chronic bilateral edema, unchanged from prior exam       A/P    (788.41) Urinary frequency  (primary encounter diagnosis)  Comment: unclear etiology.  Neg for UTI.  Voiding diary suggests frequent urination of small volumes of urine that occurs only during  the day - no nighttime symptoms.  This suggests she could potentially benefit from bladder training.  She reports the feeling of incomplete bladder emptying but we were unable to perform a post-void residual today.  No other neurologic symptoms that would suggest urinary retention.  Unclear if dermatomyositis or medications (mycophenolate mofetil, pred, hydroxychloroquine) may contribute to symptoms - will read about possible side effects.  Pelvic exam without gross abnormality or prolapse.  Given persistence of symptoms and no improvement despite conservative management (Kegels, voiding diary, ruling out UTI) will refer to urology for urodynamic testing and recs for further work up.  Plan: REFERRAL TO UROLOGY ( INT)            (599.70) Hematuria  Comment: resolved.  May represent menstrual bleeding.    Plan: URINE DIP (POINT OF CARE), URINALYSIS  - consider further work up if it recurs outside of menses    (V25.9) Contraception  Comment: needs IUD changed  Plan: REFERRAL TO GYNECOLOGY (INT)      (V04.81) Need for prophylactic vaccination and inoculation against influenza  Comment: routine  Plan: IMMUNIZATION ADMIN SINGLE, RN, INFLUENZA VIRUS         QUAD PRESV FREE VACCINE 3/> YRS IM (PUBLIC)      06/29/2013  VIS given prior to administration and reviewed with the patient and or legal guardian. Patient  understands the disease and the vaccine. See immunization/Injection module or chart review for date of publication and additional information.  Ravonda Brecheen

## 2013-06-29 NOTE — Progress Notes (Signed)
PRECEPTOR NOTE  On the day of the patient's visit, I personally saw and evaluated the patient. In addition, I reviewed findings with the resident. I confirm the key elements of history and physical exam as described in resident's note.  I agree with the assessment and plan as described below.  Please see resident's note for further details.

## 2013-06-29 NOTE — Patient Instructions (Signed)
Decrease prednisone to 2mg  alternating with 1mg  for 6 weeks    1mg  for 6 weeks    1mg  every other day for 6 weeks    Try to stop.

## 2013-06-29 NOTE — Progress Notes (Signed)
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      She is on 2 mg of prednisone, mycophenolate 1500 mg twice a day, methotrexate 10 mg, and hydroxychloroquine 400 mg.    She describes diffuse pruritus.  She thought it might be related to lowering the prednisone but has not noticed that it has worsened.  She has photosensitivity and has noticed tightening in the face.  She describes occasional dysphagia when eating meat and certain other foods.  Denies typical heartburn.  Denies weakness, falls, chest pain, dyspnea, cough.    PAST MEDICAL HISTORY:    1.  Shingles on her face that was complicated by cellulitis  2.  Breast lump that was thought to be related to dermatomyositis many years ago    3.  Proteinuria    4.  Seasonal allergies  5.  Anemia  6.  History of erythema nodosum  7.  Inflammatory arthritis    SOCIAL HISTORY:  No alcohol, no tobacco.    CURRENT MEDICATIONS:  Calcium, vitamin D, clobetasol solution for her scalp, folic acid, hydroxychloroquine, methotrexate, multivitamin, mupirocin, mycophenolate, prednisone.    PHYSICAL EXAMINATION:  VITAL SIGNS:  Height 5 feet 8 inches, weight 140 pounds, BMI 21, blood pressure 120/80, pulse 68, SpO2 of 100%, pain 0/10.   GENERAL:  She is in no acute distress.  She is alert and oriented.  SKIN:  She has areas of shiny, hypo and hyperpigmented and erythematous areas of the skin.  She has areas of vitiligo.  Anterior to the ears on the face, she has tight skin, but the rest of the face does not have any tightness.  There is almost a tethering but I cannot say that this is sclerodermatous.  Her chest feels almost sclerodermatous in nature as does some of the waxiness in the skin in the arms.  She has hypopigmentation and erythema over the joints in the fingers with some areas that are open.  I did not evaluate her scalp today.  MUSCULOSKELETAL:  She does not have  Raynaud's, and I did not appreciate any synovitis, no weakness.    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      PLAN:  1.  Repeat PFTs.  2.  Consider a CT scan and echocardiogram in the near future.    3.  Barium swallow.    4.  Repeat labs today.    The patient is agreeable to this plan.  I will see her back in 6 weeks.    ___________________________  Reviewed and Electronically Signed By: Charlane Ferretti MD  Sig Date: 02/02/2014  Sig Time: 21:38:08  Dictated By: Charlane Ferretti MD  Dict Date: 06/29/2013 Dict Time: 05 51 PM    Dictation Date and Time:06/29/2013 17:51:35  Transcription Date and Time:06/29/2013 18:55:18  eScription Dictation id: [id number] Confirmation # :[id number]  cc: Lynnda Shields MD

## 2013-06-29 NOTE — Progress Notes (Signed)
Influenza Vaccine Procedure  June 29, 2013    1. Has the patient received the information for the influenza vaccine? Yes, .    2. Does the patient have any of the following contraindications?  Allergy to eggs? No  Allergic reaction to previous influenza vaccines? No  Any other problems to previous influenza vaccines? No  Paralyzed by Guillain-Barre syndrome?  No  Current moderate or severe illness? No  Allergy to contact lens solution? No    3. The vaccine has been administered in the usual fashion.     Immunization information reviewed. Current VIS reviewed and given to patient/ guardian. Verbal assent obtained from patient/ guardian.  See immunization/Injection module or chart review for date of publication and additional information. Verbal assent obtained from patient/guardian. Comfort measures for possible side effects reviewed.

## 2013-06-29 NOTE — Progress Notes (Signed)
The primary progress note for this visit has been dictated through E-Scription. It can be viewed as an attachment to this encounter or through Chart Review under the Other Tab as an Rheumatology Office Note.      Review of Systems: Constitutional, Eyes, ENT/Mouth, Cardiovascular, Respiratory, GI, GU, Neuro, Psych, Heme/Lymph, Skin, Musculoskeletal, and Endocrine systems were reviewed and are NEGATIVE, except for what is dictated in the note.      >50% of a 40 minute visit was spent counseling, educating, reviewing outside medical records, and coordinating medical care, outside of the time needed to perform any procedures.

## 2013-06-30 ENCOUNTER — Telehealth (HOSPITAL_BASED_OUTPATIENT_CLINIC_OR_DEPARTMENT_OTHER): Payer: Self-pay | Admitting: ESP Geriatrics

## 2013-06-30 ENCOUNTER — Telehealth (HOSPITAL_BASED_OUTPATIENT_CLINIC_OR_DEPARTMENT_OTHER): Payer: Self-pay | Admitting: Internal Medicine

## 2013-06-30 NOTE — Progress Notes (Signed)
A user error has taken place.

## 2013-06-30 NOTE — Progress Notes (Addendum)
Left v/mail for patient to inform appt for PFT is scheduled at the [facility] on 9/25/2014at 1:00 pm.Baruim Swallow appt scheduled on 07/13/2013 at 1:00 pm at [facility].

## 2013-07-06 ENCOUNTER — Ambulatory Visit (HOSPITAL_BASED_OUTPATIENT_CLINIC_OR_DEPARTMENT_OTHER): Payer: PRIVATE HEALTH INSURANCE | Admitting: Ophthalmology

## 2013-07-06 ENCOUNTER — Ambulatory Visit (HOSPITAL_BASED_OUTPATIENT_CLINIC_OR_DEPARTMENT_OTHER): Payer: Self-pay | Admitting: Internal Medicine

## 2013-07-06 DIAGNOSIS — Z79899 Other long term (current) drug therapy: Secondary | ICD-10-CM

## 2013-07-06 DIAGNOSIS — M3390 Dermatopolymyositis, unspecified, organ involvement unspecified: Secondary | ICD-10-CM

## 2013-07-06 DIAGNOSIS — M3313 Other dermatomyositis without myopathy: Secondary | ICD-10-CM

## 2013-07-06 DIAGNOSIS — M349 Systemic sclerosis, unspecified: Secondary | ICD-10-CM

## 2013-07-06 NOTE — Nursing Note (Signed)
>>   [facility]     Thu Jul 06, 2013  3:58 PM  Here for an annual eye exam for Myopia, plaquenil for dermatomyositis.    No complaints or concerns.    Had a HVF done.

## 2013-07-06 NOTE — Progress Notes (Signed)
.  Julie Freeman was seen at the [facility].    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    On dilated fundus exam there was no maculopathy.    She had a recent visual field test that did not reveal any central vision loss.    She will rtc every year as long as she takes plaquenil.    She will have a yearly visual field test as well.

## 2013-07-09 ENCOUNTER — Other Ambulatory Visit (HOSPITAL_BASED_OUTPATIENT_CLINIC_OR_DEPARTMENT_OTHER): Payer: Self-pay | Admitting: Internal Medicine

## 2013-07-10 NOTE — Progress Notes (Signed)
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 I would be very interested to see what Dr. Minette Headland feels when he sees her at her upcoming appointment with him. I am curious if she may benefit from intralesional glucocorticoid injections on the scalp.   PLAN:   1. Repeat PFTs.   2. Consider a CT scan and echocardiogram in the near future.   3. Barium swallow.   4. Repeat labs today.   The patient is agreeable to this plan. I will see her back in 6 weeks.   Dictation Date and Time:06/29/2013 17:51:35   Transcription Date and Time:06/29/2013 18:55:18   eScription Dictation id: [id number] Confirmation # :[id number]

## 2013-07-11 NOTE — Telephone Encounter (Signed)
Message copied by Venita Sheffield on Tue Jul 11, 2013  2:45 PM  ------       Message from: Faylene Kurtz       Created: Tue Jul 11, 2013 12:19 PM       Regarding: chapnick- rxrefill       Contact: [phone]         [facility]              Person calling on behalf of patient: Patient (self)              May list multiple medications in this section              Medicine Name: hydroxychloroquine (PLAQUENIL) 200 MG tablet   & mupirocin (BACTROBAN) 2 % ointment   & methotrexate 2.5 MG tablet                     Dosage: hydroxychloroquine (PLAQUENIL) 200 MG tablet  &mupirocin (BACTROBAN) 2 % ointment   & methotrexate 2.5 MG tablet              Frequency (how many pills, how many times a day): hydroxychloroquine (PLAQUENIL) 200 MG tablet-- two a day    & mupirocin (BACTROBAN) 2 % ointment as needed   & methotrexate 2.5 MG tablet              Number of pills left: will run  Out soon              Documented patient preferred pharmacies:        [id number] [name], [place] - [address] AT [facility] [address]       Phone: [phone] Fax: [phone]                     Cleotis Lema NUMBER: [phone]              Patient's language of care: English              Patient needs a [place] interpreter.                              ------

## 2013-07-11 NOTE — Progress Notes (Signed)
Attempted to reach patient.  Left message.  Will notify provider.

## 2013-07-12 ENCOUNTER — Telehealth (HOSPITAL_BASED_OUTPATIENT_CLINIC_OR_DEPARTMENT_OTHER): Payer: Self-pay | Admitting: ESP Geriatrics

## 2013-07-12 NOTE — Progress Notes (Signed)
Spoke with pharmacist who stated that the pt scripts are ready for pick up.    Attempted to reach patient.  Left message.  Will notify provider.

## 2013-07-12 NOTE — Telephone Encounter (Signed)
Message copied by Venita Sheffield on Wed Jul 12, 2013 10:09 AM  ------       Message from: Faylene Kurtz       Created: Tue Jul 11, 2013  3:44 PM       Regarding: FW: chapnick- rxrefill       Contact: [phone]         Pt returning your call       ----- Message -----          From: Faylene Kurtz          Sent: 07/11/2013  12:19 PM            To: [facility]       Subject: chapnick- rxrefill                                         [facility]              Person calling on behalf of patient: Patient (self)              May list multiple medications in this section              Medicine Name: hydroxychloroquine (PLAQUENIL) 200 MG tablet   & mupirocin (BACTROBAN) 2 % ointment   & methotrexate 2.5 MG tablet                     Dosage: hydroxychloroquine (PLAQUENIL) 200 MG tablet  &mupirocin (BACTROBAN) 2 % ointment   & methotrexate 2.5 MG tablet              Frequency (how many pills, how many times a day): hydroxychloroquine (PLAQUENIL) 200 MG tablet-- two a day    & mupirocin (BACTROBAN) 2 % ointment as needed   & methotrexate 2.5 MG tablet              Number of pills left: will run  Out soon              Documented patient preferred pharmacies:        [id number] [name], [place] - [address] AT [facility] [address]       Phone: [phone] Fax: [phone]                     Cleotis Lema NUMBER: [phone]              Patient's language of care: English              Patient needs a [place] interpreter.                                     ------

## 2013-07-13 ENCOUNTER — Ambulatory Visit (HOSPITAL_BASED_OUTPATIENT_CLINIC_OR_DEPARTMENT_OTHER): Payer: Self-pay | Admitting: Internal Medicine

## 2013-07-13 ENCOUNTER — Ambulatory Visit: Payer: Self-pay | Admitting: Internal Medicine

## 2013-07-13 DIAGNOSIS — R1310 Dysphagia, unspecified: Secondary | ICD-10-CM

## 2013-07-13 DIAGNOSIS — M349 Systemic sclerosis, unspecified: Secondary | ICD-10-CM

## 2013-07-13 DIAGNOSIS — M3390 Dermatopolymyositis, unspecified, organ involvement unspecified: Secondary | ICD-10-CM

## 2013-07-13 LAB — COMPREHENSIVE METABOLIC PANEL
ALANINE AMINOTRANSFERASE: 23 U/L (ref 12–45)
ALBUMIN: 4.2 g/dL (ref 3.4–5.0)
ALKALINE PHOSPHATASE: 36 U/L — ABNORMAL LOW (ref 45–117)
ANION GAP: 7 mmol/L (ref 5–15)
ASPARTATE AMINOTRANSFERASE: 19 U/L (ref 8–34)
BILIRUBIN TOTAL: 0.7 mg/dL (ref 0.2–1.0)
BUN (UREA NITROGEN): 10 mg/dL (ref 7–18)
CALCIUM: 9.4 mg/dL (ref 8.5–10.1)
CARBON DIOXIDE: 27 mmol/L (ref 21–32)
CHLORIDE: 105 mmol/L (ref 98–107)
CREATININE: 0.4 mg/dL (ref 0.4–1.2)
ESTIMATED GLOMERULAR FILT RATE: >60 mL/min (ref >60)
Glucose Random: 80 mg/dL (ref 74–160)
POTASSIUM: 4.1 mmol/L (ref 3.5–5.1)
SODIUM: 139 mmol/L (ref 136–145)
TOTAL PROTEIN: 7.4 g/dL (ref 6.4–8.2)

## 2013-07-13 LAB — CBC, PLATELET & DIFFERENTIAL
ABSOLUTE BASO COUNT: 0 10*3/uL (ref 0.0–0.1)
ABSOLUTE EOSINOPHIL COUNT: 0.1 10*3/uL (ref 0.0–0.8)
ABSOLUTE IMM GRAN COUNT: 0.01 10*3/uL (ref 0.00–0.03)
ABSOLUTE LYMPH COUNT: 0.9 10*3/uL (ref 0.6–5.9)
ABSOLUTE MONO COUNT: 0.6 10*3/uL (ref 0.2–1.4)
ABSOLUTE NEUTROPHIL COUNT: 2.8 10*3/uL (ref 1.6–8.3)
BASOPHIL %: 0.5 % (ref 0.0–1.2)
EOSINOPHIL %: 1.6 % (ref 0.0–7.0)
HEMATOCRIT: 34.4 % (ref 34.1–44.9)
HEMOGLOBIN: 11.3 g/dL (ref 11.2–15.7)
IMMATURE GRANULOCYTE %: 0.2 % (ref 0.0–0.4)
LYMPHOCYTE %: 19.8 % (ref 15.0–54.0)
MEAN CORP HGB CONC: 32.8 g/dL (ref 31.0–37.0)
MEAN CORPUSCULAR HGB: 32.6 pg (ref 26.0–34.0)
MEAN CORPUSCULAR VOL: 99.1 fL (ref 80.0–100.0)
MEAN PLATELET VOLUME: 9.9 fL (ref 8.7–12.5)
MONOCYTE %: 14.3 % — ABNORMAL HIGH (ref 4.0–13.0)
NEUTROPHIL %: 63.6 % (ref 40.0–75.0)
PLATELET COUNT: 250 10*3/uL (ref 150–400)
RBC DISTRIBUTION WIDTH STD DEV: 48 fL — ABNORMAL HIGH (ref 35.1–46.3)
RBC DISTRIBUTION WIDTH: 13.3 % (ref 11.5–14.3)
RED BLOOD CELL COUNT: 3.47 M/uL — ABNORMAL LOW (ref 3.90–5.20)
WHITE BLOOD CELL COUNT: 4.3 10*3/uL (ref 4.0–11.0)

## 2013-07-13 LAB — CREATINE KINASE TOTAL: CREATINE KINASE TOTAL: 136 IU/L (ref 26–192)

## 2013-07-13 LAB — XR SPEECH MODIFIED BARIUM SWALLOW

## 2013-07-13 NOTE — Procedures (Signed)
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   She takes prednisone, methotrexate, and hydroxychloroquine.   She reported dysphagia  when eating meat and certain other foods at her MD appointment on 06/29/2013.  At today's swallow evaluation she noted that she has some difficulty biting into fruit due to mildly reduced jaw movement and mildly slowed, effortful swallow with foods such as grapes, chunky meat, and dry foods.  PMH: shingles on face complicated by cellulitis, seasonal allergies, anemia, inflammatory arthritis      O:   07/13/13 1330   Language Information   Language of Care English   Interpreter No   Evaluation Type   Evaluation Type Initial Evaluation   Rehab Discipline   Rehab Discipline SLP   Premorbid Sensory   Hearing Normal   Sensory Vision Normal   Premorbid Cognition   Cognition Intact   Premorbid Communication   Communication Normal   Premorbid Nutrition   Nutrition Normal  (reports mild difficulty swallowing recent month or two)   Premorbid Vocational   Occupation office assistant   Patient Stated Goals   Patient stated goals "I would like to know more about my swallowing."   Strengths   Strengths Premorbid level of function;Attitude of self   Expression   Primary Mode of Expression Verbal   Oral/Motor   Labial ROM WFL   Labial Symmetry WFL   Labial Strength WFL   Labial Sensation WFL   Lingual ROM Reduced elevation  (mild decreased protrusion)   Lingual Symmetry WFL   Lingual Strength WFL   Lingual Sensation WFL   Velum WFL   Mandible Reduced on opening  (mild, can't bite into an apple unless cut up)   Facial Symmetry WFL   Facial Sensation WFL   Vocal Quality WFL   Vocal Intensity WFL   Gag WFL   Apraxia None present   Intelligibility Intelligible   Breath Support Adequate for speech   Dentition Adequate  (a couple of missing molars, chew primarily on opposite side)   Hearing WFL   Clinical Swallow - Aspiration Risk   Risk for Aspiration None    Modified Barium Swallow   Radiologist Dr. Gasper Sells   Dysphagia Diagnosis Mild oral stage dysphagia;Mild pharyngeal stage dysphagia   Current Solid Diet Regular   Current Liquid Diet Thin/regular   MBS - Consistencies Assessed   Consistencies Assessed Yes   Thin   Presentation Cup;Self Fed   Oral WFL   Pharyngeal WFL   Puree   Presentation Self Fed   Oral WFL   Pharyngeal WFL   Ground   Presentation Self Fed   Oral WFL   Pharyngeal WFL   Solid   Presentation Self Fed   Oral Impaired Mastication;Increased Anterior to Posterior Transit  (mild)   Pharyngeal Delayed Swallow  (mild due to mild reduced base of tongue movement with solids)   MBS - Results   Oral Prep Stage Overall WFL with the exceptiong of mild reduced mandible movement, minimally reduced base of tongue strength   Oral Transfer WFT, minimal reduced base of tongue strength slows oral transit with dry or chunky solid foos   Pharyngeal Stage WFL with the exception of slowed BOT movement with chunky or drier solids.   Esophageal Stage Functional cricopharyngeal relaxation   Compensatory Techniques Attempted Alternating solids and liquids was helpful   Aspiration Risk Recommendations   Aspiration Risk Recommendations (none needed)   Diet Solids Recommendation General consistency  (  moisten foods as needed)   Diet Liquids Recommendations No Liquid Consistency Restrictions   Compensatory Swallowing Strategies Upright as possible for all oral intake;Alternate solids and liquids   Recommended Form of Meds Whole   Prognosis   Prognosis for Safe Diet Advancement Excellent   Barriers/Prognosis Comment no barriers   Recommendations   SLP Frequency Evaluation only   Individuals Consulted   Consulted and Agree with Results and Recommendations Patient     A:  Patient presents with mild/minimal oropharyngeal dysphagia characterized by slowed mastication of dry, chunky solid foods due to mildly reduced mandible ROM, mildly reduced lingual ROM though overall lingual  strength is good, and mildly effort BOT movement to initiate swallows with these same foods.  Swallow response is timely once the BOT contacts the pharyngeal wall.  There was no pharyngeal residue after the swallows with any consistency.  Hyolaryngeal excursion was complete.  No airway penetration or aspiration occurred.  She is also missing a couple of molars which impact mastication of the chunkier and drier food items.      Recommend:  Continue with regular diet textures and thin liquids, though add moisture and cut up as needed.  Of note, patient reports that she is working on a prednisone taper.  If her swallow function worsens as the taper continues, she should notify her physician.

## 2013-08-06 ENCOUNTER — Other Ambulatory Visit (HOSPITAL_BASED_OUTPATIENT_CLINIC_OR_DEPARTMENT_OTHER): Payer: Self-pay | Admitting: Internal Medicine

## 2013-08-07 ENCOUNTER — Encounter (HOSPITAL_BASED_OUTPATIENT_CLINIC_OR_DEPARTMENT_OTHER): Payer: Self-pay | Admitting: Internal Medicine

## 2013-08-07 DIAGNOSIS — R1310 Dysphagia, unspecified: Secondary | ICD-10-CM | POA: Insufficient documentation

## 2013-08-07 NOTE — Progress Notes (Signed)
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 I would be very interested to see what Dr. Stavert feels when he sees her at her upcoming appointment with him. I am curious if she may benefit from intralesional glucocorticoid injections on the scalp.   PLAN:   1. Repeat PFTs.   2. Consider a CT scan and echocardiogram in the near future.   3. Barium swallow.   4. Repeat labs today.   The patient is agreeable to this plan. I will see her back in 6 weeks.   Dictation Date and Time:06/29/2013 17:51:35   Transcription Date and Time:06/29/2013 18:55:18

## 2013-08-10 ENCOUNTER — Encounter (HOSPITAL_BASED_OUTPATIENT_CLINIC_OR_DEPARTMENT_OTHER): Payer: Self-pay | Admitting: Urology

## 2013-08-10 ENCOUNTER — Ambulatory Visit (HOSPITAL_BASED_OUTPATIENT_CLINIC_OR_DEPARTMENT_OTHER): Payer: Self-pay | Admitting: Internal Medicine

## 2013-08-10 ENCOUNTER — Ambulatory Visit (HOSPITAL_BASED_OUTPATIENT_CLINIC_OR_DEPARTMENT_OTHER): Payer: PRIVATE HEALTH INSURANCE | Admitting: Urology

## 2013-08-10 VITALS — BP 140/90 | HR 80

## 2013-08-10 DIAGNOSIS — M3390 Dermatopolymyositis, unspecified, organ involvement unspecified: Secondary | ICD-10-CM

## 2013-08-10 DIAGNOSIS — R350 Frequency of micturition: Secondary | ICD-10-CM

## 2013-08-10 DIAGNOSIS — M3313 Other dermatomyositis without myopathy: Secondary | ICD-10-CM

## 2013-08-10 LAB — URINALYSIS
BILIRUBIN, URINE: NEGATIVE
CASTS: NONE SEEN PER LPF
CRYSTALS: NONE SEEN
GLUCOSE, URINE: NEGATIVE MG/DL
KETONE, URINE: NEGATIVE MG/DL
LEUKOCYTE ESTERASE: NEGATIVE
NITRITE, URINE: NEGATIVE
OCCULT BLOOD, URINE: NEGATIVE
PH URINE: 6 (ref 5.0–8.0)
PROTEIN, URINE: 30 MG/DL — AB
SPECIFIC GRAVITY URINE: 1.03 (ref 1.003–1.035)

## 2013-08-10 LAB — URINE DIP (POINT OF CARE)
BILIRUBIN, URINE: NEGATIVE
GLUCOSE, URINE: NEGATIVE mg/dl
KETONE, URINE: NEGATIVE mg/dl
NITRITE, URINE: NEGATIVE
OCCULT BLOOD, URINE: NEGATIVE
PH URINE: 6 (ref 5.0–8.0)
SPECIFIC GRAVITY URINE: >1.03 (ref 1.003–1.030)
UROBILINOGEN URINE: 0.2 mg/dl (ref 0.2–1.0)

## 2013-08-10 LAB — MEAS POST-VOIDING RESIDUAL URINE&/BLADDER CAP
Post Void Residual: 105 — AB (ref 0–0)
Post Void Residual: 95 — AB (ref 0–0)

## 2013-08-10 MED ORDER — PENTOSAN POLYSULFATE SODIUM 100 MG PO CAPS
100.0000 mg | ORAL_CAPSULE | Freq: Three times a day (TID) | ORAL | Status: DC
Start: 2013-08-10 — End: 2013-10-25

## 2013-08-10 NOTE — Progress Notes (Signed)
Pt. Tolerated cystoscopy procedure well. VSS138/80-80, no c/o pain or discomfort, no bleeding. Pt. explained that they may experience some burning with urination and also see some blood in urine for the next day or two. Encouraged to push fluids and call if symptoms persist. Pt. Demonstrated good understanding.

## 2013-08-10 NOTE — Progress Notes (Signed)
Review of Systems   Constitutional: Negative.    HENT: Negative.    Eyes: Negative.    Respiratory: Negative.    Cardiovascular: Negative.    Gastrointestinal: Negative.    Musculoskeletal:        Generalized muscle weakness from dermatomyositis   Skin: Positive for rash.        Over majority of body, with alopecia, uclerations over fingers.   Neurological: Negative.    Endo/Heme/Allergies: Negative.    Psychiatric/Behavioral: Negative.

## 2013-08-11 LAB — URINE CULTURE: URINE CULTURE/COLONY COUNT: NO GROWTH

## 2013-08-14 LAB — CYTOLOGY SPECIMEN

## 2013-08-14 NOTE — Procedures (Signed)
Date of Service: 08/03/2013    INDICATION FOR TESTING has scleroderma.  Please see media manager tab in Epic for complete raw data.    FEV1 is 2.84 L, which is 88% of predicted.  Postbronchodilator, this improves by 14% to 3.24 L and 100% of predicted.  FVC increases from 3.38 L and 82% of predicted to 3.82 L and 93% of predicted, which is a 13% improvement.  FEV1 to FVC ratio is 85%.  FEF 25% to 75% improves from 78% of predicted to 108% of predicted postbronchodilator.    Lung volumes are within normal limits.  Vital capacity is 94% of predicted at 3.87 L.  Total lung capacity is 93% of predicted at 5.48 L.    DLCO was 113% of predicted.  When adjusted for alveolar volume, this is 121% of predicted.    O2 saturation on room air at rest is 100%, and this is maintained with walking at a normal pace for 500 feet.    When compared to December 08, 2010, spirometry is almost identical, as are lung volumes.  Total lung capacity in February 2012 was 5.63 L and 95% of predicted, compared to current values of 5.48 L and 93% of predicted.  DLCO in 2012 was slightly lower at 98% of predicted and 106% of predicted when corrected for alveolar volume.    IMPRESSION:  Minimal obstructive dysfunction seen only at low lung volumes.  Flows are within normal limits prebronchodilator but improve significantly postbronchodilator, in some cases to supranormal levels.  There is no evidence for restrictive dysfunction, and diffusion capacity is normal.  There has been no worsening compared to 2012.    ___________________________  Reviewed and Electronically Signed By: Karleen Dolphin MD  Sig Date: 08/21/2013  Sig Time: 09:00:01  Dictated By: Karleen Dolphin MD  Dict Date: 08/14/2013 Dict Time: 10 14 AM    Dictation Date and Time:08/14/2013 10:14:19  Transcription Date and Time:08/14/2013 10:26:11  eScription Dictation id: [id number] Confirmation # :[id number]      cc: Cecilio Asper MD

## 2013-08-16 ENCOUNTER — Telehealth (HOSPITAL_BASED_OUTPATIENT_CLINIC_OR_DEPARTMENT_OTHER): Payer: Self-pay | Admitting: Internal Medicine

## 2013-08-16 NOTE — Progress Notes (Signed)
Left v/mail for patient to inform appt on 08/17/2013 at 3:00 pm has been canceled with Dr  Lenor Derrick. Informed patient provider can accommodate on 08/18/2013 inform to call back to reschedule.

## 2013-08-17 ENCOUNTER — Ambulatory Visit (HOSPITAL_BASED_OUTPATIENT_CLINIC_OR_DEPARTMENT_OTHER): Payer: PRIVATE HEALTH INSURANCE | Admitting: Internal Medicine

## 2013-08-21 ENCOUNTER — Ambulatory Visit (HOSPITAL_BASED_OUTPATIENT_CLINIC_OR_DEPARTMENT_OTHER): Payer: PRIVATE HEALTH INSURANCE | Admitting: Internal Medicine

## 2013-08-21 VITALS — BP 136/84 | HR 61 | Temp 96.7°F | Ht 68.0 in | Wt 142.0 lb

## 2013-08-21 DIAGNOSIS — M898X9 Other specified disorders of bone, unspecified site: Secondary | ICD-10-CM

## 2013-08-21 DIAGNOSIS — M3390 Dermatopolymyositis, unspecified, organ involvement unspecified: Secondary | ICD-10-CM

## 2013-08-21 DIAGNOSIS — L579 Skin changes due to chronic exposure to nonionizing radiation, unspecified: Secondary | ICD-10-CM

## 2013-08-21 DIAGNOSIS — M3313 Other dermatomyositis without myopathy: Secondary | ICD-10-CM

## 2013-08-21 DIAGNOSIS — R252 Cramp and spasm: Secondary | ICD-10-CM

## 2013-08-21 LAB — XR CLAVICLE RIGHT 2 VIEWS

## 2013-08-21 MED ORDER — MYCOPHENOLATE MOFETIL 500 MG PO TABS
1500.0000 mg | ORAL_TABLET | Freq: Two times a day (BID) | ORAL | Status: DC
Start: 2013-08-21 — End: 2014-01-22

## 2013-08-21 MED ORDER — LACTIC ACID 10 % EX LOTN
30.0000 mL | TOPICAL_LOTION | Freq: Two times a day (BID) | CUTANEOUS | Status: DC
Start: 2013-08-21 — End: 2013-09-27

## 2013-08-21 NOTE — Patient Instructions (Signed)
Lotion or cream with lactic acid- Lac-Hydrin if not covered

## 2013-08-21 NOTE — Progress Notes (Signed)
The primary progress note for this visit has been dictated through E-Scription. It can be viewed as an attachment to this encounter or through Chart Review under the Other Tab as an Rheumatology Office Note.      Review of Systems: Constitutional, Eyes, ENT/Mouth, Cardiovascular, Respiratory, GI, GU, Neuro, Psych, Heme/Lymph, Skin, Musculoskeletal, and Endocrine systems were reviewed and are NEGATIVE, except for what is dictated in the note.      >50% of a 40 minute visit was spent counseling, educating, reviewing outside medical records, and coordinating medical care, outside of the time needed to perform any procedures.

## 2013-08-21 NOTE — Progress Notes (Signed)
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    She takes mycophenolate 1500 mg twice a day, prednisone 1 mg alternating with 2 mg, methotrexate 10 mg a week, and hydroxychloroquine 400 mg a day.      She continues to have intermittent achiness without joint swelling.  No weakness.  She no longer has falls.  She has been exercising at the gym.  She is concerned about the difference in body habitus between the upper extremities and the lower extremities.  She always had this sort of body type, but it seems more pronounced, and she has not been losing weight in the lower extremities.  She exercises at a gym but does not have a trainer.  She will occasionally have cramps in the legs at night.    She reports roughness and dark areas on the skin of the chest.  There is a small lesion in her left auricular area that is slightly raised but nontender.  She has a dermatology appointment coming up in a couple of weeks.    She is concerned about increased bowel movements that are not watery or bloody and increase in flatus.  She denies abdominal pain.  She continues to have mild dysphagia without unintentional weight loss.  A barium swallow was negative.    The patient denies dyspnea, cough, and chest pain.  PFTs did not show a restrictive pattern.    The patient has noticed a bone mass on the right distal clavicle.    The patient has been having frequent urination.  She started pentosan, which she does not think has helped.  She had a cystoscopy and reports having erythema in the bladder with a planned bladder biopsy.    PAST MEDICAL HISTORY:  1.  Shingles on the face that was complicated by a secondary cellulitis  2.  Breast lump, thought to be related to inflammatory mass from dermatomyositis  3.  Proteinuria  4.  Seasonal allergies  5.  Anemia  6.  History of erythema nodosum    SOCIAL HISTORY:  No alcohol, no tobacco.  She is not  currently working.    CURRENT MEDICATIONS:  Calcium, vitamin D, clobetasol external solution for the scalp, folic acid, hydroxychloroquine, methotrexate, mupirocin, mycophenolate, pentosan (new), and prednisone 1 mg alternating with 2 mg.    PHYSICAL EXAMINATION:  VITAL SIGNS:  Height 5 feet 8 inches, weight is 142 pounds, BP 136/84, P 61, T 96.7, BMI 22.  GENERAL:  She is in no acute distress.  She is alert and oriented.  HEENT:  Anicteric sclerae.  NECK:  Oropharynx clear.  LUNGS:  Clear.  EXTREMITIES:  No edema.  MUSCULOSKELETAL:  She is thin in the upper extremities and larger in the lower extremities.  There is a small bone mass in the distal right clavicle.  SKIN:  There are areas of erythema, atrophy, breakdown, and hyper- and hypo-pigmentation.  On the right chest wall, there is more of a velvety, darker, slightly raised skin There is a small lesion in the left periauricular area that is raised but does not appear pearly.  There is a small scab.  MUSCULOSKELETAL:  I do not appreciate any synovitis.  No muscle weakness, normal narrow-based gait.    LABORATORY DATA:  1.  Creatinine 0.4, GFR greater than 60.  LFTs are not elevated, albumin 4.2  2.  CK 136  3.  Urinalysis with trace  protein  4.  WBC 4.3; hematocrit 34.4; MCV 99; platelets 250,000  5.  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  She has areas of skin that appear to be almost sclerodermatous in nature, although this may be from long-term dermatomyositis changes.  She has vitiligo, hyperpigmentation, and atrophy.    She is going to be going to the dermatologist.  I have asked her to discuss the possibility of an overlap with scleroderma, although systemically, there is no evidence of organ involvement from scleroderma.  I would like her scalp to be evaluated, as well as the lesion on her face.  For her chest, she will try lactic acid lotion.    She has no evidence of pulmonary involvement at this time.  She was encouraged to  continue exercise.    Body habitus:  There is likely a genetic component, but her dermatomyositis primarily affected her upper extremities, and her skin is more involved in the upper extremities, which may be contributing to the different appearance in the upper and lower body.      Leg cramps:  She will try diet tonic water.  Showed her how to stretch out her calves and her hamstrings.    Increased bowel movements and flatus:  It may be related to increasing her fiber.  I recommend simethicone as needed.    Bone mass:  This is likely benign, possibly a bone spur.  I will check x-ray.    I will see her back in 3 months, earlier if needed.    ___________________________  Reviewed and Electronically Signed By: Charlane Ferretti MD  Sig Date: 02/02/2014  Sig Time: 23:00:55  Dictated By: Charlane Ferretti MD  Dict Date: 08/21/2013 Dict Time: 09 20 AM    Dictation Date and Time:08/21/2013 09:20:42  Transcription Date and Time:08/21/2013 11:32:30  eScription Dictation id: [id number] Confirmation # :[id number]

## 2013-08-30 NOTE — Progress Notes (Signed)
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    HISTORY OF PRESENT ILLNESS:  Ms. 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  She has been on these meds for approximately 8 years.  She has also been on CellCept and prednisone in the past.  All of her symptoms started at the age of 36, right before her pregnancy.  She developed muscle weakness in both her upper and lower extremities.    With regards to her urination, these symptoms started about the same time.  Her main complaint is frequency, both daytime and nighttime.  She urinates every 1-2 hours during the day and 1-2 times per night.  She actually has a voiding diary, which she brought with her today, and she urinates anywhere from 1-6 ounces every 1-3 hours during the day.  She denies any history of gross hematuria or dysuria.  She has no obstructive symptoms.  Should mention that she does have oral ulcers associated with her systemic disorder.    The patient has a history of 1 vaginal delivery.  She is premenopausal.  She uses an IUD for birth control.  She is sexually active but denies any dyspareunia or vaginal dryness.  She does not have a history of bladder infections.  Urinalysis in September 2014 was negative, and urine culture in June 2014 showed only less than 10K mixed.    REVIEW OF SYSTEMS:  In the Epic encounter and is notable for generalized muscle weakness from the dermatomyositis and the skin rash over the majority of the body.    PMH/PSH/Meds/ALL/SH/FH all reviewed and updated in Epic system    PHYSICAL EXAM:  Vital Signs: as recorded  General: Patient is well-developed, well-nourished, and in no apparent distress.    Head and Face: Face is symmetric, EOMI, PERRL.  No nystagmus  Neuro: Alert and oriented to time, place, and person.  Affect is appropriate.  Neck: Without masses and trachea midline.  No thyroid enlargement.  Lungs: No respiratory distress, no intercostal retractions, no use of accessory muscles.  Abdomen: Soft, nontender, nondistended, with no organomegaly or palpable masses.  No unexplained abdominal scars and no hernia.  Back: No spinal or costovertebral tenderness to percussion.  Extremities: Lower extremities without edema and well perfused.  Lymphatic: No palpable cervical, supraclavicular, or inguinal lymphadenopathy.  Dermatologic:  She has alopecia, ulcerations of her fingers, and a desquamative rash over the majority of her body.  Genitourinary:  Labia majora, minora, and vulva are without lesion or erythema.  Bartholin, Skene, and periurethral glands are without mass, cyst, or tenderness.  She exhibits no cystocele.    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  Her systemic disease is clearly attacking her skin and mucosa.  She has a history of oral ulcers associated with this.  Thus, I find it very likely that the same process attacks her bladder mucosa.  We reviewed overactive bladder diet, which includes all the drinks and foods that are known irritants to the bladder, including acidic foods and spicy foods.  I also think it is worth  a trial of Elmiron, which we generally use for interstitial cystitis but whose main mechanism is to strengthen the bladder mucosal wall.  I have given her a prescription for this and will see her back in 1 month's time to see how she is doing.    ___________________________  Reviewed and Electronically Signed By: Tresa Moore MD  Sig Date: 09/06/2013  Sig Time: 12:06:10  Dictated By: Tresa Moore MD  Dict Date: 08/30/2013 Dict  Time: 01 18 PM    Dictation Date and Time:08/30/2013 13:18:13  Transcription Date and Time:08/30/2013 14:31:34  eScription Dictation id: [id number] Confirmation # :[id number]      cc: Algis Downs MD

## 2013-09-01 ENCOUNTER — Other Ambulatory Visit (HOSPITAL_BASED_OUTPATIENT_CLINIC_OR_DEPARTMENT_OTHER): Payer: Self-pay | Admitting: Internal Medicine

## 2013-09-02 NOTE — Progress Notes (Signed)
Person calling on behalf of patient: Pharmacy    Julie Freeman is a 37 year old female     Please review:   Per epic, this medication had an order class of "historical".    - medication(s) request: Prednisone 1 mg  - last office visit: 08/21/2013  - last physical exam: 09/10/2011      Other Med Adult:  Most Recent BP Reading(s)  08/21/13 : 136/84      Cholesterol (mg/dL)   Date  Value    1/61/0960  124    ----------  LDL DIRECT (mg/dL)   Date  Value    4/54/0981  51    ----------  HDL (mg/dL)   Date  Value    1/91/4782  63*   ----------  TRIGLYCERIDES (mg/dl)   Date  Value    95/62/1308  51    ----------      No results found for this basename: TSHSC      No results found for this basename: TSH    HEMOGLOBIN A1C (%)   Date  Value    01/24/2013  5.0    ----------      INR (no units)   Date  Value    10/10/2010  1.1*   ----------           Documented patient preferred pharmacies:    [id number] [name], [place] - [address]  Phone: [phone] Fax: [phone]

## 2013-09-04 ENCOUNTER — Encounter (HOSPITAL_BASED_OUTPATIENT_CLINIC_OR_DEPARTMENT_OTHER): Payer: Self-pay | Admitting: Urology

## 2013-09-04 ENCOUNTER — Ambulatory Visit (HOSPITAL_BASED_OUTPATIENT_CLINIC_OR_DEPARTMENT_OTHER)
Admit: 2013-09-04 | Discharge: 2013-09-04 | Disposition: A | Discharge status: Home / Self Care | Payer: Self-pay | Source: Ambulatory Visit | Attending: Urology | Admitting: Urology

## 2013-09-04 ENCOUNTER — Other Ambulatory Visit (HOSPITAL_BASED_OUTPATIENT_CLINIC_OR_DEPARTMENT_OTHER): Payer: Self-pay | Admitting: Registered Nurse

## 2013-09-04 DIAGNOSIS — M3390 Dermatopolymyositis, unspecified, organ involvement unspecified: Secondary | ICD-10-CM

## 2013-09-04 DIAGNOSIS — R3915 Urgency of urination: Secondary | ICD-10-CM | POA: Diagnosis present

## 2013-09-04 DIAGNOSIS — M3313 Other dermatomyositis without myopathy: Secondary | ICD-10-CM

## 2013-09-04 NOTE — H&P (Signed)
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     Had recent negative urine cx on 10/30. No fevers, hematuria, N/V or flank pain. Has recently had a burning discomfort near vagina when voiding - per pt occasionally gets this small area of irritation - does not sound like true dysuria.    PROBLEM LIST:   Patient Active Problem List:     Dermatomyositis     Scleroderma     Breast nodule     Shingles     Cellulitis     Proteinuria     Seasonal allergies     IUD surveillance     Bilateral ovarian simple cysts     PPD negative     Vulvodynia     Impacted cerumen     Foreign body in ear     Dysphagia     Urinary urgency    Past Medical History:     Past Medical History    Dermatomyositis     Shingles      Past Surgical History:       Past Surgical History    BREAST LUMPECTOMY         Allergies: Review of Patient's Allergies indicates:  No Known Allergies    Outpatient Medications:   Current outpatient prescriptions:mycophenolate (CELLCEPT) 500 MG tablet, Take 3 tablets by mouth 2 (two) times daily., Disp: 180 tablet, Rfl: 3;  Lactic Acid 10 % LOTN, Apply 30 mLs Topically 2 (two) times daily., Disp: 473 mL, Rfl: 11;  pentosan polysulfate (ELMIRON) 100 MG capsule, Take 1 capsule by mouth 3 (three) times daily before meals., Disp: 90 capsule, Rfl: 2  hydroxychloroquine (PLAQUENIL) 200 MG tablet, TAKE 2 TABLETS BY MOUTH EVERY DAY, Disp: 60 tablet, Rfl: 3;  mupirocin (BACTROBAN) 2 % ointment, APPLY TOPICALLY THREE TIMES DAILY, Disp: 22 g, Rfl: 11;  methotrexate 2.5 MG tablet, TAKE 10 TABLETS BY MOUTH ONCE A WEEK- IN DIVIDED DOSING, Disp: 40 tablet, Rfl: 3  Calcium Carbonate-Vitamin D 600-400 MG-UNIT TABS Tablet, TAKE ONE (1) TABLET TWICE DAILY.  Do not take within a few hours of mycophenolate., Disp:  60 tablet, Rfl: 11;  predniSONE (DELTASONE) 1 MG tablet, Take 2 tablets by mouth daily., Disp: 60 tablet, Rfl: 3;  Cholecalciferol (VITAMIN D) 2000 UNITS CAPS, Take 1 capsule by mouth daily., Disp: 30 capsule, Rfl: 11  folic acid (FOLVITE) 1 MG tablet, Take 3 tablets by mouth daily. Dose increase, Disp: 90 tablet, Rfl: 11;  clobetasol (TEMOVATE) 0.05 % external solution, APPLY TOPICALLY TWO TIMES DAILY, Disp: 50 mL, Rfl: 3;  multivitamin (THERAGRAN) per tablet, TAKE ONE (1) TABLET DAILY, Disp: 30 tablet, Rfl: 11    Takes occasional ibuprofen for HA -- will hold for OR    Tobacco:   Smoking status: Never Smoker    Smokeless tobacco: Not on file       Alcohol:   Alcohol Use: Yes   Comment: rare       Drugs: No    Family History:     Family History    No Known Family History Mother     Diabetes Father        ROS: as per HPI, has limited ability to open mouth, no CP or SOB, no other GI  sx, has rash on most of body, no open or draining areas    LMP: 08/22/13 (has Mirena, due to be changed out soon)    EXAM:    GENERAL- A&O, NAD, alopecia with scattered rash and lesions on face and hands   VS: BP 118/70  Pulse 67  Temp(Src) 98.2 F (36.8 C) (Temporal)  Resp 16  Ht 5\' 8"  (1.727 m)  Wt 143 lb (64.864 kg)  BMI 21.75 kg/m2  SpO2 100%  LMP 08/22/2013  HEENT - MMM, very limited ROM of jaw  NECK - no bruits  HEART - RRR, no m,r,g  CHEST - CTA B/L  ABDOMEN -  soft, NT, ND   BACK - No CVAT  EXT- calves NT (wearing tights - unable to assess edema)   NEUROLOGIC - grossly intact    Labs:   Component Value Date/Time   WBC 4.3 07/13/2013  2:32 PM   HCT 34.4 07/13/2013  2:32 PM   PLTA 250 07/13/2013  2:32 PM        NA 139 07/13/2013  2:32 PM   K 4.1 07/13/2013  2:32 PM   CL 105 07/13/2013  2:32 PM   CO2 27 07/13/2013  2:32 PM   BUN 10 07/13/2013  2:32 PM   CREAT 0.4 07/13/2013  2:32 PM   CA 9.4 07/13/2013  2:32 PM        PT 11.9* 10/10/2010  8:15 PM   APTT 31.7 10/10/2010  8:15 PM   INR 1.1* 10/10/2010  8:15 PM     08/10/13 Urine cx - no  growth    Assessment/Plan: 37 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 Had recent negative urine cx on 10/30. Currently some localized burning with urination but does not sound like dysuria. Has very limited ability to open mouth -- noted by anesthesia, see their note.    - see recent labs and urine cx  - Kefzol on call  - no need for venodynes per booking sheet  - will check POC Hcg    Bonner Puna, PA-C, 09/04/2013, 10:48 AM       Pager [phone]

## 2013-09-04 NOTE — Discharge Instructions (Signed)
SURGICAL DAY CARE PRE-OPERATIVE INSTRUCTIONS    DAY OF SURGERY    Arrive at: [facility] Registration on Friday, December  5 at (Time): 12:45pm.     You must have a responsible adult available to accompany you home after surgery. (We suggest that you have someone available to assist you at home after your surgery).    INSTRUCTIONS:      You may have nothing to eat or drink after 6AM, the morning of surgery .      You may not smoke the morning of your surgery.     Please leave all valuables at home, including jewelry, watches, money, etc.     Please remove all fingernail polish before arriving at Surgical Day Care and do not  wear any face or lip make-up.     Do not shave surgical site.     Do not wear contact lenses.    MEDICATIONS:     Take the following medication the night before surgery at bedtime: all as usual    Take the following medication the morning of your surgery with only a sip of water: all your usual except vitamins

## 2013-09-04 NOTE — H&P (Signed)
Pre-Anesthetic Note    Pre op Diagnosis: chronic urinary urgency and frequency    Planned Procedure: cystoscopy/bladder bx    Patient ID:  Julie Freeman isa 37 year old female with a history of dermatomyositis           Previous Anesthetic History:       Past Surgical History    BREAST LUMPECTOMY     GA  Patient denies complicationsof anesthesia.  Patient denies family complications of anesthesia.    Current Medications:    Current Outpatient Prescriptions:  mycophenolate (CELLCEPT) 500 MG tablet Take 3 tablets by mouth 2 (two) times daily. Disp: 180 tablet Rfl: 3   Lactic Acid 10 % LOTN Apply 30 mLs Topically 2 (two) times daily. Disp: 473 mL Rfl: 11   pentosan polysulfate (ELMIRON) 100 MG capsule Take 1 capsule by mouth 3 (three) times daily before meals. Disp: 90 capsule Rfl: 2   hydroxychloroquine (PLAQUENIL) 200 MG tablet TAKE 2 TABLETS BY MOUTH EVERY DAY Disp: 60 tablet Rfl: 3   mupirocin (BACTROBAN) 2 % ointment APPLY TOPICALLY THREE TIMES DAILY Disp: 22 g Rfl: 11   methotrexate 2.5 MG tablet TAKE 10 TABLETS BY MOUTH ONCE A WEEK- IN DIVIDED DOSING Disp: 40 tablet Rfl: 3   Calcium Carbonate-Vitamin D 600-400 MG-UNIT TABS Tablet TAKE ONE (1) TABLET TWICE DAILY.  Do not take within a few hours of mycophenolate. Disp: 60 tablet Rfl: 11   predniSONE (DELTASONE) 1 MG tablet Take 2 tablets by mouth daily. Disp: 60 tablet Rfl: 3   Cholecalciferol (VITAMIN D) 2000 UNITS CAPS Take 1 capsule by mouth daily. Disp: 30 capsule Rfl: 11   folic acid (FOLVITE) 1 MG tablet Take 3 tablets by mouth daily. Dose increase Disp: 90 tablet Rfl: 11   clobetasol (TEMOVATE) 0.05 % external solution APPLY TOPICALLY TWO TIMES DAILY Disp: 50 mL Rfl: 3   multivitamin (THERAGRAN) per tablet TAKE ONE (1) TABLET DAILY Disp: 30 tablet Rfl: 11   No current facility-administered medications for this encounter.      Allergies:   Review of Patient's Allergies indicates:  No Known Allergies    Smoking, Alcohol, Drugs:     Smoking status: Never  Smoker     Smokeless tobacco: Not on file    Alcohol Use: Not on file       Drug Use: Not on file       PMHx:    Past Medical History    Dermatomyositis     Shingles        PHYSICAL EXAMINATION:  Ht 5'8" Wt 143 lbs  General: skin abnormalities- multiple scabs, rashes, "burn like" appearance over all visible skin    Airway Classification:  Mallampati: Class I     A soft palate, fauces, uvula, anterior and posterior tonsil pillars are seen.   Mallampati Airway Classification:   Oral Opening:  2cm  Full range of neck Motion:  Yes   Teeth- intact  Lungs: clear to auscultation bilaterally    Heart: normal and regular rate and rhythm      Pertinent Labs:   Component Value Date   NA 139 07/13/2013   K 4.1 07/13/2013   CREAT 0.4 07/13/2013   GLUCOSER 80 07/13/2013   WBC 4.3 07/13/2013   HCT 34.4 07/13/2013   PLTA 250 07/13/2013   PT 11.9* 10/10/2010   APTT 31.7 10/10/2010   INR 1.1* 10/10/2010        Other Findings: None    ASA Assessment: III  A Patient With Sever Systemic Disease That Limits but is not Incapacitating  Emergency:  No   Potential Anesthesia Problems:  Yes - The pt is not able to open her mouth well. She had a GA one year ago without problems. It is not clear whether her mouth opens wider under GA but a mask anesthetic may be reasonable if the procedure is brief. Intubation may be a challenge if her mouth does not open well under GA. LMA placement might also be challenging to get into mouth but in view of her hx of recent GA without problems and a short procedure it seems reasonable to plan LMA placement with mask GA as backup if LMA placement is difficult.    Plan:  GA  Pt told she can eat until 8 hours before surgery and that she should take all her medication in the morning except her vitamins.  Ovid Curd  Ext [phone]        [address] = Mandatory Field.  For non-mandatory fields, the provider will enter relevant positive findings.

## 2013-09-05 MED ORDER — PREDNISONE 1 MG PO TABS
2.0000 mg | ORAL_TABLET | Freq: Every day | ORAL | Status: DC
Start: 2013-09-04 — End: 2014-01-12

## 2013-09-14 ENCOUNTER — Ambulatory Visit (HOSPITAL_BASED_OUTPATIENT_CLINIC_OR_DEPARTMENT_OTHER): Payer: PRIVATE HEALTH INSURANCE | Admitting: Dermatology

## 2013-09-14 ENCOUNTER — Encounter (HOSPITAL_BASED_OUTPATIENT_CLINIC_OR_DEPARTMENT_OTHER): Payer: Self-pay | Admitting: Dermatology

## 2013-09-14 DIAGNOSIS — M3390 Dermatopolymyositis, unspecified, organ involvement unspecified: Secondary | ICD-10-CM

## 2013-09-14 DIAGNOSIS — M3313 Other dermatomyositis without myopathy: Secondary | ICD-10-CM

## 2013-09-14 LAB — CBC, PLATELET & DIFFERENTIAL
ABSOLUTE BASO COUNT: 0 10*3/uL (ref 0.0–0.1)
ABSOLUTE EOSINOPHIL COUNT: 0.1 10*3/uL (ref 0.0–0.8)
ABSOLUTE IMM GRAN COUNT: 0.01 10*3/uL (ref 0.00–0.03)
ABSOLUTE LYMPH COUNT: 1 10*3/uL (ref 0.6–5.9)
ABSOLUTE MONO COUNT: 0.8 10*3/uL (ref 0.2–1.4)
ABSOLUTE NEUTROPHIL COUNT: 2.6 10*3/uL (ref 1.6–8.3)
BASOPHIL %: 0.4 % (ref 0.0–1.2)
EOSINOPHIL %: 1.8 % (ref 0.0–7.0)
HEMATOCRIT: 36 % (ref 34.1–44.9)
HEMOGLOBIN: 12 g/dL (ref 11.2–15.7)
IMMATURE GRANULOCYTE %: 0.2 % (ref 0.0–0.4)
LYMPHOCYTE %: 22.7 % (ref 15.0–54.0)
MEAN CORP HGB CONC: 33.3 g/dL (ref 31.0–37.0)
MEAN CORPUSCULAR HGB: 33.1 pg (ref 26.0–34.0)
MEAN CORPUSCULAR VOL: 99.2 fL (ref 80.0–100.0)
MEAN PLATELET VOLUME: 10.5 fL (ref 8.7–12.5)
MONOCYTE %: 17.1 % — ABNORMAL HIGH (ref 4.0–13.0)
NEUTROPHIL %: 57.8 % (ref 40.0–75.0)
PLATELET COUNT: 226 10*3/uL (ref 150–400)
RBC DISTRIBUTION WIDTH STD DEV: 42.1 fL (ref 35.1–46.3)
RBC DISTRIBUTION WIDTH: 12.1 % (ref 11.5–14.3)
RED BLOOD CELL COUNT: 3.63 M/uL — ABNORMAL LOW (ref 3.90–5.20)
WHITE BLOOD CELL COUNT: 4.5 10*3/uL (ref 4.0–11.0)

## 2013-09-14 MED ORDER — FLUOCINONIDE 0.05 % EX SOLN
Freq: Two times a day (BID) | CUTANEOUS | Status: DC
Start: 2013-09-14 — End: 2013-12-26

## 2013-09-14 MED ORDER — CLOBETASOL PROPIONATE 0.05 % EX OINT
TOPICAL_OINTMENT | Freq: Two times a day (BID) | CUTANEOUS | Status: AC
Start: 2013-09-14 — End: 2013-10-14

## 2013-09-14 NOTE — Progress Notes (Addendum)
Julie Freeman is a 37 year old female patient.    HPI:   Julie Freeman is a very pleasant 37 year old female who presents for evaluation of a rash and history of dermatomyositis and possibly scleroderma.    Pt reports that around the summer of 2003, prior to her first pregnancy, she started to note purple skin changes on her face. During her pregnancy, she was tested for lupus, but she tested negative. After her son was born she began to have pain in her muscles with much of her usual activities. This pain continued to get worse so that she could not function, including getting up from the toilet. She was referred to [facility] where a large workup and evaluation was performed and she was told that she had dermatomyositis. She began a regimen of physical therapy for several weeks/months after that. She at various times has been followed by a rheumatologist and dermatologist and most recently has been followed by Dr.Chapnick.     Pt reports that she has been told that her primary issue is dermatomyositis. She has less pain and immobility than she did a few years ago but occasionally experiences muscular pain. She reports that exercise has been very helpful for her in maintaining her muscle strength.     Pt reports that since she became sick she noted that her arms are extremely thin, and that above her waist she is very thin, but that she has more mass below her waist.     Pt report that she has as history of multiple flare ups that have taken different forms. She reports that she developed hard bumps on her legs with fevers. She was started on prednisone and this improved the lesions. She has been on prednisone       Pt reports that her hands are one of the worst affected areas. It is difficult for her to open things. When the weather is cold her hands "break out in little cuts". She notes that in some places the skin of her hands is hard. The skin on her arms, legs, chest, and areas that are exposed to the sun seem to  be abnormal to her. Sometimes there are rough bumps that feel dry.     She reports that she had an episode several months ago where she developed "sores" on her scalp. The only thing that helped to heal these sores was mupirocin. Since that time she lost hair in the areas where she had sores which never grew back. Since that time she has worn wigs and covered her hair.    She reports that she had not sought out care for her skin in many years because she felt that dermatologists had not been able to fix her skin.     Today she is most concerned about her hands and her scalp. Her scalp is constantly very itchy. She notes that her scalp itches most in areas where she has hair. She has tried clobetasol solution to her scalp with minimal effect. She feels that her scalp is less itchy when she cuts her hair short.     Per chart review, Pt has a history of dermatomyositis diagnosed by muscle bx in the past. She has also had skin biopsies consistent with dermatomyositis performed in outside hospital and scanned into EPIC. She currently is maintained on plaquenil, methotrexate,cellcept, and prednisone which she has recently tapered to 1mg  daily.    FH/SH: She is divorced, she has a son. She admits a  preference for women which she reports has caused some stress between her and her parents. She lives with a younger brother in an apartment.    Review of Systems - per HPI    Past Medical History:    Past Medical History    Dermatomyositis     Shingles        Current Medications:    Current Outpatient Prescriptions:  predniSONE (DELTASONE) 1 MG tablet Take 2 tablets by mouth daily. Disp: 60 tablet Rfl: 3   mycophenolate (CELLCEPT) 500 MG tablet Take 3 tablets by mouth 2 (two) times daily. Disp: 180 tablet Rfl: 3   Lactic Acid 10 % LOTN Apply 30 mLs Topically 2 (two) times daily. Disp: 473 mL Rfl: 11   pentosan polysulfate (ELMIRON) 100 MG capsule Take 1 capsule by mouth 3 (three) times daily before meals. Disp: 90 capsule Rfl: 2    hydroxychloroquine (PLAQUENIL) 200 MG tablet TAKE 2 TABLETS BY MOUTH EVERY DAY Disp: 60 tablet Rfl: 3   mupirocin (BACTROBAN) 2 % ointment APPLY TOPICALLY THREE TIMES DAILY Disp: 22 g Rfl: 11   methotrexate 2.5 MG tablet TAKE 10 TABLETS BY MOUTH ONCE A WEEK- IN DIVIDED DOSING Disp: 40 tablet Rfl: 3   Calcium Carbonate-Vitamin D 600-400 MG-UNIT TABS Tablet TAKE ONE (1) TABLET TWICE DAILY.  Do not take within a few hours of mycophenolate. Disp: 60 tablet Rfl: 11   Cholecalciferol (VITAMIN D) 2000 UNITS CAPS Take 1 capsule by mouth daily. Disp: 30 capsule Rfl: 11   folic acid (FOLVITE) 1 MG tablet Take 3 tablets by mouth daily. Dose increase Disp: 90 tablet Rfl: 11   clobetasol (TEMOVATE) 0.05 % external solution APPLY TOPICALLY TWO TIMES DAILY Disp: 50 mL Rfl: 3   multivitamin (THERAGRAN) per tablet TAKE ONE (1) TABLET DAILY Disp: 30 tablet Rfl: 11     No current facility-administered medications for this visit.    Allergies:  Review of Patient's Allergies indicates:  No Known Allergies    Hospital Problem List:  Active Problems:    * No active hospital problems. *      Vitals:  Extended Vitals not filed for this encounter.    Physical Exam  A total body skin examination was performed including the scalp, face, head, neck, trunk, extremities, mucous membranes, and groin. Genital exam was deferred today. Notable findings include:  -AOx3  -On the scalp there are several discrete plaques of scarring alopecia. There are intermixed telangiectasias  - There is scattered ill defined scale through the scalp  - There are mottled occasional inflammatory pink patches with areas of depigmentation in the scalp  - Most prominently on the periorbital skin but also on the lateral aspects of the cheek there are dyspigmented patches with intermixed hyper, hyopigmentation and telangiectasias  - On the chest, in a V distribution, as well as on the posterior neck, there are dyspigmented patches with intermixed hyper/hypopigmentation and  telangiectasias  - The face is somewhat cachetic appearing  - There is normal oral aperture  - there is no heliotrope sign  - Bilateral upper extremities are very thin with apparent loss of the subcutaneous fat. 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    For now I recommended:  -Pt's case is complicated and she has seen multiple providers so I will attempt to consolidate outside medical records to better understand previous workup and treatment plans. She is currently on plaquenil, methotrexate, cellcept, and prednisone, with significant subjective improvement in muscular function but persistent cutaneous symptoms  -We agreed to focus today on the areas that were most bothersome to her- her scalp and her hands.   - For her hands start clobetasol 0.05% ointment BID x 2 weeks. At night time use with cotton gloves for occlusion  - After two weeks keep hands well moisturized with thick emollient like vaseline. Continue to use cotton gloves at night for occlusion  - For her scalp, continue clobetasol 0.05% scalp solution BID to scalp  - Trial of derma-smoothe scalp oil, massaged into scalp nightly  - Trial of neutrogena t-gel shampoo alternating with t-sal shampoo  - I will check labs today including anti-scl 70, anti Jo-1, anti Mi-2, ant-centromere antibodies to assess for serologic evidence of scleroderma overlap  -Considered biopsy today although I suspected  that skin biopsy would show changes c/w dermatomyositis or other connective tissue disease as has been demonstrated before  - Discussed importance of photoprotection given photosensitivity associated with this patient's condition. I suspect that this is at least contributing to her current dyspigmentation  - At next visit will review age appropriate cancer screening to ensure that patient is up to date  - Will also discuss with Dr.Chapnick regarding potential need for PCP prophylaxis given multiple immunosuppressive medications with long term prednisone  .  Will re-evaluate in 4 weeks.          Silvestre Gunner, MD  09/14/2013

## 2013-09-15 ENCOUNTER — Ambulatory Visit (HOSPITAL_BASED_OUTPATIENT_CLINIC_OR_DEPARTMENT_OTHER)
Admit: 2013-09-15 | Disposition: A | Discharge status: Home / Self Care | Payer: Self-pay | Source: Ambulatory Visit | Attending: Urology | Admitting: Urology

## 2013-09-15 ENCOUNTER — Encounter (HOSPITAL_BASED_OUTPATIENT_CLINIC_OR_DEPARTMENT_OTHER): Payer: Self-pay | Admitting: Urology

## 2013-09-15 LAB — URINE PREGNANCY TEST (POINT OF CARE): HCG QUALITATIVE URINE: NEGATIVE

## 2013-09-15 MED ORDER — LACTATED RINGERS IV SOLN
INTRAVENOUS | Status: DC
Start: 2013-09-15 — End: 2013-09-19
  Administered 2013-09-15: 12:00:00 via INTRAVENOUS

## 2013-09-15 MED ORDER — OXYCODONE-ACETAMINOPHEN 5-325 MG PO TABS
1.0000 | ORAL_TABLET | ORAL | Status: AC | PRN
Start: 2013-09-15 — End: 2013-09-22

## 2013-09-15 MED ORDER — MEPERIDINE HCL 50 MG/ML IJ SOLN
12.5000 mg | INTRAMUSCULAR | Status: DC | PRN
Start: 2013-09-15 — End: 2013-09-19

## 2013-09-15 MED ORDER — FENTANYL CITRATE 0.05 MG/ML IJ SOLN
INTRAMUSCULAR | Status: AC
Start: 2013-09-15 — End: 2013-09-15
  Filled 2013-09-15: qty 2

## 2013-09-15 MED ORDER — NALOXONE HCL 0.4 MG/ML IJ SOLN
0.0400 mg | INTRAMUSCULAR | Status: DC | PRN
Start: 2013-09-15 — End: 2013-09-19

## 2013-09-15 MED ORDER — PHENAZOPYRIDINE HCL 200 MG PO TABS
200.0000 mg | ORAL_TABLET | Freq: Three times a day (TID) | ORAL | Status: AC | PRN
Start: 2013-09-15 — End: 2013-09-22

## 2013-09-15 MED ORDER — MIDAZOLAM HCL 2 MG/2ML IJ SOLN
INTRAMUSCULAR | Status: AC
Start: 2013-09-15 — End: 2013-09-15
  Filled 2013-09-15: qty 2

## 2013-09-15 MED ORDER — ONDANSETRON HCL 4 MG/2ML IJ SOLN
4.0000 mg | Freq: Four times a day (QID) | INTRAMUSCULAR | Status: DC | PRN
Start: 2013-09-15 — End: 2013-09-19

## 2013-09-15 MED ORDER — HYDROMORPHONE HCL PF 1 MG/ML IJ SOLN
0.2500 mg | INTRAMUSCULAR | Status: DC | PRN
Start: 2013-09-15 — End: 2013-09-19
  Administered 2013-09-15: 0.25 mg via INTRAVENOUS
  Filled 2013-09-15: qty 1

## 2013-09-15 MED ORDER — ACETAMINOPHEN 325 MG PO TABS
650.0000 mg | ORAL_TABLET | Freq: Once | ORAL | Status: DC | PRN
Start: 2013-09-15 — End: 2013-09-19

## 2013-09-15 MED ORDER — CEFAZOLIN SODIUM 1 G IJ SOLR
1.0000 g | Freq: Once | INTRAMUSCULAR | Status: AC
Start: 2013-09-15 — End: 2013-09-15
  Administered 2013-09-15: 1 g via INTRAVENOUS

## 2013-09-15 MED ORDER — PHENAZOPYRIDINE HCL 100 MG PO TABS
200.00 mg | ORAL_TABLET | Freq: Once | ORAL | Status: AC
Start: 2013-09-15 — End: 2013-09-15
  Administered 2013-09-15: 200 mg via ORAL
  Filled 2013-09-15: qty 2

## 2013-09-15 NOTE — Op Note (Signed)
Date of Operation: 09/15/2013    PREOPERATIVE DIAGNOSIS:  Urinary urgency and frequency.    POSTOPERATIVE DIAGNOSIS:  Urinary urgency and frequency.    PROCEDURE:  Random cystoscopy and random bladder biopsies.    SURGEON:  Florina Ou, MD.    ASSISTANT:  None.    ANESTHESIA:  General.    COMPLICATIONS:  None.    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  Office cystoscopy had shown some erythematous areas of the bladder and for that reason we scheduled her for a cystoscopy and bladder biopsy.    DESCRIPTION OF PROCEDURE:  The patient was brought to the operating room.  General anesthesia was obtained.  Preoperative antibiotics were administered.  She was prepped and draped in dorsal lithotomy position.  Surgical timeout was performed with consensus on patient, procedure, and side by surgical nursing and anesthesia staff.    I began by introducing a 22-French rigid cystoscope and visualizing the bladder.  There were no distinct papillary lesions or stones.  There was some mild erythema at the bladder dome.  Using cold cup biopsy forceps, I did take 3 separate biopsies of this area.  Both ureteral orifices were visualized and were normal in appearance.  I did use a 70-degree months and examined the entire bladder neck.  There were no abnormalities.  Bugbee electrocautery was used to gain hemostasis.  Bladder was emptied.  The patient was awakened from anesthesia and transferred to the PACU in stable condition.    ___________________________  Reviewed and Electronically Signed By: Tresa Moore MD  Sig Date: 09/21/2013  Sig Time: 14:03:52  Dictated By: Tresa Moore MD  Dict Date: 09/15/2013 Dict Time: 02 55 PM    Dictation Date and Time:09/15/2013 14:55:17  Transcription Date and Time:09/15/2013 15:21:03  eScription Dictation id: [id number] Confirmation # :[id number]

## 2013-09-15 NOTE — Interval H&P Note (Signed)
Patient Assessment Update: (Fill out Prior to procedure or within 24 hours of  admission if H&P done pre-admission.)   Re-evaluation including history review and physical examination has been performed.    Patient's Condition No Change    Julie Moore, MD, 09/15/2013, 1:33 PM

## 2013-09-15 NOTE — Progress Notes (Signed)
PROCEDURE  Risks and benefits were reviewed and surgical consent was signed.     The patient was prepped and draped in dorsal lithotomy position.  Procedural time-out was performed with consensus on patient and procedure by surgical and nursing staff.   10cc 1% lidocaine jelly was instilled per urethra for local anesthesia.  16 Fr flexible cystoscope was introduced into the urethra and both the urethra and bladder were visually inspected.  Bladder: No striations, trabeculations, saccules, or diverticula.  No stones.  Erythematous areas of bladder, though no papillary lesions seen.  On retroflexion, no lesions of the bladder neck.  Both ureteral orifices were visualized and were without bloody efflux.  The entirety of the urethra was examined: there were no papillary lesions, no diverticula, or other anomolies.  Patient tolerated the procedure well.  Results were discussed.    AP:  Given her chronic hx, I think it prudent to obtain bx of erythematous areas.  Risks reviewed, consent signed.

## 2013-09-15 NOTE — Discharge Instructions (Signed)
POST-OPERATIVE DISCHARGE INSTRUCTIONS     YOUR PROCEDURE:   Cystoscopy with Biopsy  We used a scope to look at the inside of your bladder.  We then used a tiny forceps to take small biopsies.  We may have used a small amount of cautery to stop any bleeding.    What to expect: A bladder biopsy can cause discomfort that varies from person to person.  Some people report a nagging pain pain in their bladder region.  Others report a sense of bladder irritation and they feel like the need to urinate very frequently.  We have provided pain medications and a bladder numbing medicine for any discomfort that you might have.   You may see blood in your urine, this is normal.  The blood in your urine may come and go - you may not see if for several days and then it may occur.      Diet: You may resume a regular diet.  However, for the first day it is probably best to eat foods that are easy on the stomach.  Drink plenty of fluids today and tomorrow.     Activity: Quiet activity today and then resume normal activity as tolerated.   You should avoid heavy exercise while the stent is in place.     Bathing: You may use shower or bathtub when fully recovered from anesthesia.    Medications:   • Pyridium: This medicine numbs the bladder and urethra to decrease any burning you may experience when you urinate.  The medicine turns your urine BRIGHT ORANGE.  Do not be alarmed.  You may take this medication up to 3 times a day as needed - if you do not have discomfort with urination, you do not need to take this medication.  • Percocet: Percocet: This is a combination of Tylenol (Acetominophen) and the narcotic Oxycodone.  You may take 1-2 pills every 3-4 hours as needed for pain.  As your pain improves, transition from Percocet to Tylenol (Acetominophen).  Remember that Percocet contains Tylenol, and thus you do not want to take Tylenol in addition to Percocet.  Some people develop nausea from Percocet (especially on an empty stomach),  and thus we recommend taking this medication with a little bit of food.  Do not drive while taking narcotics.

## 2013-09-15 NOTE — H&P (View-Only) (Signed)
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     Had recent negative urine cx on 10/30. No fevers, hematuria, N/V or flank pain. Has recently had a burning discomfort near vagina when voiding - per pt occasionally gets this small area of irritation - does not sound like true dysuria.    PROBLEM LIST:   Patient Active Problem List:     Dermatomyositis     Scleroderma     Breast nodule     Shingles     Cellulitis     Proteinuria     Seasonal allergies     IUD surveillance     Bilateral ovarian simple cysts     PPD negative     Vulvodynia     Impacted cerumen     Foreign body in ear     Dysphagia     Urinary urgency    Past Medical History:     Past Medical History    Dermatomyositis     Shingles      Past Surgical History:       Past Surgical History    BREAST LUMPECTOMY         Allergies: Review of Patient's Allergies indicates:  No Known Allergies    Outpatient Medications:   Current outpatient prescriptions:mycophenolate (CELLCEPT) 500 MG tablet, Take 3 tablets by mouth 2 (two) times daily., Disp: 180 tablet, Rfl: 3;  Lactic Acid 10 % LOTN, Apply 30 mLs Topically 2 (two) times daily., Disp: 473 mL, Rfl: 11;  pentosan polysulfate (ELMIRON) 100 MG capsule, Take 1 capsule by mouth 3 (three) times daily before meals., Disp: 90 capsule, Rfl: 2  hydroxychloroquine (PLAQUENIL) 200 MG tablet, TAKE 2 TABLETS BY MOUTH EVERY DAY, Disp: 60 tablet, Rfl: 3;  mupirocin (BACTROBAN) 2 % ointment, APPLY TOPICALLY THREE TIMES DAILY, Disp: 22 g, Rfl: 11;  methotrexate 2.5 MG tablet, TAKE 10 TABLETS BY MOUTH ONCE A WEEK- IN DIVIDED DOSING, Disp: 40 tablet, Rfl: 3  Calcium Carbonate-Vitamin D 600-400 MG-UNIT TABS Tablet, TAKE ONE (1) TABLET TWICE DAILY.  Do not take within a few hours of mycophenolate., Disp:  60 tablet, Rfl: 11;  predniSONE (DELTASONE) 1 MG tablet, Take 2 tablets by mouth daily., Disp: 60 tablet, Rfl: 3;  Cholecalciferol (VITAMIN D) 2000 UNITS CAPS, Take 1 capsule by mouth daily., Disp: 30 capsule, Rfl: 11  folic acid (FOLVITE) 1 MG tablet, Take 3 tablets by mouth daily. Dose increase, Disp: 90 tablet, Rfl: 11;  clobetasol (TEMOVATE) 0.05 % external solution, APPLY TOPICALLY TWO TIMES DAILY, Disp: 50 mL, Rfl: 3;  multivitamin (THERAGRAN) per tablet, TAKE ONE (1) TABLET DAILY, Disp: 30 tablet, Rfl: 11    Takes occasional ibuprofen for HA -- will hold for OR    Tobacco:   Smoking status: Never Smoker    Smokeless tobacco: Not on file       Alcohol:   Alcohol Use: Yes   Comment: rare       Drugs: No    Family History:     Family History    No Known Family History Mother     Diabetes Father        ROS: as per HPI, has limited ability to open mouth, no CP or SOB, no other GI   sx, has rash on most of body, no open or draining areas    LMP: 08/22/13 (has Mirena, due to be changed out soon)    EXAM:    GENERAL- A&O, NAD, alopecia with scattered rash and lesions on face and hands   VS: BP 118/70  Pulse 67  Temp(Src) 98.2 F (36.8 C) (Temporal)  Resp 16  Ht 5' 8" (1.727 m)  Wt 143 lb (64.864 kg)  BMI 21.75 kg/m2  SpO2 100%  LMP 08/22/2013  HEENT - MMM, very limited ROM of jaw  NECK - no bruits  HEART - RRR, no m,r,g  CHEST - CTA B/L  ABDOMEN -  soft, NT, ND   BACK - No CVAT  EXT- calves NT (wearing tights - unable to assess edema)   NEUROLOGIC - grossly intact    Labs:   Component Value Date/Time   WBC 4.3 07/13/2013  2:32 PM   HCT 34.4 07/13/2013  2:32 PM   PLTA 250 07/13/2013  2:32 PM        NA 139 07/13/2013  2:32 PM   K 4.1 07/13/2013  2:32 PM   CL 105 07/13/2013  2:32 PM   CO2 27 07/13/2013  2:32 PM   BUN 10 07/13/2013  2:32 PM   CREAT 0.4 07/13/2013  2:32 PM   CA 9.4 07/13/2013  2:32 PM        PT 11.9* 10/10/2010  8:15 PM   APTT 31.7 10/10/2010  8:15 PM   INR 1.1* 10/10/2010  8:15 PM     08/10/13 Urine cx - no  growth    Assessment/Plan: 37 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 Had recent negative urine cx on 10/30. Currently some localized burning with urination but does not sound like dysuria. Has very limited ability to open mouth -- noted by anesthesia, see their note.    - see recent labs and urine cx  - Kefzol on call  - no need for venodynes per booking sheet  - will check POC Hcg    Diamond Jentz, PA-C, 09/04/2013, 10:48 AM       Pager [phone]

## 2013-09-15 NOTE — Progress Notes (Signed)
Reviewed discharge instruction and medication with patient , partner present. Encourage and answered questions.patient verbalized understanding.

## 2013-09-19 ENCOUNTER — Telehealth (HOSPITAL_BASED_OUTPATIENT_CLINIC_OR_DEPARTMENT_OTHER): Payer: Self-pay

## 2013-09-19 NOTE — Progress Notes (Addendum)
Client states, "I am fine, not too much pain, peeing still feels funny." Client is taking pain medication as prescribed and has relief of pain. She does not have any burning, dysuria or change in voiding routine. She is eating and drinking well. She does not have any nauseau, diarrhea. She had some constipation last night. She is ambulatory with a steady gait.     When I asked her how her surgical experience was from start to finish, she answered, "It went OK, a lot of people are pre op, people are very friendly, very helping, answered all my questions. I had no problems. You guys are doing an excellent job."

## 2013-09-20 LAB — ANTI-JO-1: ANTI-JO-1: <0.2 AI (ref 0.0–0.9)

## 2013-09-20 LAB — ANTI-CYTONEUTROPHIL ANTIBODIES
ATYPICAL pANCA: <1:20 {titer}
C-ANCA (CYTOPLASMIC TITER): <1:20 {titer}
P-ANCA (PERINUCLEAR TITER): <1:20 {titer}

## 2013-09-20 LAB — ANTISCLERODERMA 70 ANTIBODY: ANTISCLERODERMA 70 ANTIBODY: <0.2 AI (ref 0.0–0.9)

## 2013-09-20 LAB — ANTI-MITOCHONDRIAL ANTIBODY: ANTI-MITOCHONDRIAL ANTIBODY: 3 Units (ref 0.0–20.0)

## 2013-09-22 LAB — SURGICAL PATH SPECIMEN

## 2013-09-24 ENCOUNTER — Ambulatory Visit (HOSPITAL_BASED_OUTPATIENT_CLINIC_OR_DEPARTMENT_OTHER): Payer: PRIVATE HEALTH INSURANCE

## 2013-09-25 ENCOUNTER — Ambulatory Visit (HOSPITAL_BASED_OUTPATIENT_CLINIC_OR_DEPARTMENT_OTHER): Payer: PRIVATE HEALTH INSURANCE | Admitting: Urology

## 2013-09-25 ENCOUNTER — Other Ambulatory Visit (HOSPITAL_BASED_OUTPATIENT_CLINIC_OR_DEPARTMENT_OTHER): Payer: Self-pay | Admitting: Internal Medicine

## 2013-09-25 NOTE — Progress Notes (Signed)
ASSESSMENT: A 37-year-old woman with dermatomyositis. She has areas of skin that appear to be almost sclerodermatous in nature, although this may be from long-term dermatomyositis changes. She has vitiligo, hyperpigmentation, and atrophy.   She is going to be going to the dermatologist. I have asked her to discuss the possibility of an overlap with scleroderma, although systemically, there is no evidence of organ involvement from scleroderma. I would like her scalp to be evaluated, as well as the lesion on her face. For her chest, she will try lactic acid lotion.   She has no evidence of pulmonary involvement at this time. She was encouraged to continue exercise.   Body habitus: There is likely a genetic component, but her dermatomyositis primarily affected her upper extremities, and her skin is more involved in the upper extremities, which may be contributing to the different appearance in the upper and lower body. I have asked her to consider meeting with a trainer or looking online for body sculpting exercises that may help define the musculature in her lower body. More repetitions and less weight would be helpful, as well.   Leg cramps: She will try diet tonic water. Showed her how to stretch out her calves and her hamstrings.   Increased bowel movements and flatus: It may be related to increasing her fiber. I recommend simethicone as needed.   Bone mass: This is likely benign, possibly a bone spur. I will check x-ray.   I will see her back in 3 months, earlier if needed.   Dictation Date and Time:08/21/2013 09:20:42   Transcription Date and Time:08/21/2013 11:32:30

## 2013-09-26 ENCOUNTER — Encounter (HOSPITAL_BASED_OUTPATIENT_CLINIC_OR_DEPARTMENT_OTHER): Payer: Self-pay | Admitting: Urology

## 2013-09-26 ENCOUNTER — Other Ambulatory Visit (HOSPITAL_BASED_OUTPATIENT_CLINIC_OR_DEPARTMENT_OTHER): Payer: Self-pay | Admitting: Internal Medicine

## 2013-09-26 ENCOUNTER — Telehealth (HOSPITAL_BASED_OUTPATIENT_CLINIC_OR_DEPARTMENT_OTHER): Payer: Self-pay | Admitting: ESP Geriatrics

## 2013-09-26 ENCOUNTER — Ambulatory Visit (HOSPITAL_BASED_OUTPATIENT_CLINIC_OR_DEPARTMENT_OTHER): Payer: PRIVATE HEALTH INSURANCE | Admitting: Urology

## 2013-09-26 VITALS — BP 125/77 | HR 91 | Temp 98.4°F

## 2013-09-26 DIAGNOSIS — R3915 Urgency of urination: Secondary | ICD-10-CM

## 2013-09-26 NOTE — Progress Notes (Signed)
Spoke with Baxter Hire, Pharmacist from [facility], who wanted to inform Dr Lenor Derrick that the lactic acid lotion 10% lotion is on back order.  However, the lactic cream 120 gr is avalable but not absorbant as the lotion.  Will notify provider.

## 2013-09-26 NOTE — Progress Notes (Signed)
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    Here to f/u Bladder biopsy of erythematous areas which showed inflammation, but no worrisome cancerous changes:  Has done well since bx: no further dysuria nor hematuria.    ENTERED: 09/18/13-1102    ORDERED: 16109 (TYPE 4)    >>FINAL DIAGNOSIS<<    RANDOM BLADDER, BIOPSY:  - MILD CHRONIC INFLAMMATORY CHANGES ONLY.  - THE APPEARANCES ARE NON-DIAGNOSTIC.    Urinary Sxs:  She did obtain the Elmiron - and she has noticed some improvement.  Has noticed an improvement in her [place].      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  She has been on these meds for approximately 8 years.  She has also been on CellCept and prednisone in the past.  All of her symptoms started at the age of 25, right before her pregnancy.  She developed muscle weakness in both her upper and lower extremities.    With regards to her urination, these symptoms started about the same time.  Her main complaint is frequency, both daytime and nighttime.  She urinates every 1-2 hours during the day and 1-2 times per night.  She actually has a voiding diary, which she brought with her today, and she urinates anywhere from 1-6 ounces every 1-3 hours during the day.  She denies any history of gross hematuria or dysuria.  She has no obstructive symptoms.  Should mention that she does have oral ulcers associated with her systemic disorder.    The patient has a history of 1 vaginal delivery.  She is premenopausal.  She uses an IUD for birth control.  She is sexually active but denies any dyspareunia or vaginal dryness.  She does not have a history of bladder infections.  Urinalysis in September 2014 was negative,  and urine culture in June 2014 showed only less than 10K mixed.    ROS:  Constitutional: Patient denies any unexpected weight change, chronic fatigue, fevers or chills.  Respiratory: Patient denies any shortness of breath, wheezing, or new unexplained cough.  Cardiovascular: Patient denies any chest pains, chest palpitations, or chest tightness.  GI: Patient denies abdominal pain and has a good appetite, no nausea/vomiting, and normal bowel function without hematochezia.  GU: As per HPI.  MS: Patient denies any flank pain, back pain, myalgias, or recent skeletal fractures.  Neurologic: Patient denies any new recent headaches or change in vision.  No unexplained dizziness, muscle weakness, or paresthesia.  Psychiatry: Patient denies any new confusion, depression or anxiety.  Heme: no unexplained adenopathy, bruising, or bleeding.  Skin: chronic dermatitis.    Current Medications, Allergies, PMH, and PSH were all reviewed and updated in EPIC.    PE:  Alert, oriented, NAD  Back: No spinal or costovertebral tenderness to percussion.  Abdomen: Soft, nontender, nondistended, with no organomegaly or palpable masses.    Pelvis: Bladder not palpable.  Extemities: Lower extremities without edema and well perfused.  Genitourinary (07/2013):  Labia majora, minora, and vulva are without lesion or erythema.  Bartholin, Skene, and periurethral glands are without mass, cyst, or tenderness.  She exhibits no cystocele.    ASSESSMENT AND PLAN:   This is a 37 year old female with a history  of dermatomyositis versus scleroderma, seen in f/u for chronic urinary urgency and frequency.    Here to f/u Bladder biopsy of erythematous areas which showed inflammation, but no worrisome cancerous changes.    Her sxs are improved on Elmiron, and I would like her to continue this med for now.   Continue diet low on bladder irritatants:   including acidic foods and spicy foods.

## 2013-09-26 NOTE — Progress Notes (Signed)
CURRENT MEDICATIONS: Calcium, vitamin D, clobetasol external solution for the scalp, folic acid, hydroxychloroquine, methotrexate, mupirocin, mycophenolate, pentosan (new), and prednisone 1 mg alternating with 2 mg.

## 2013-09-26 NOTE — Telephone Encounter (Signed)
Message copied by Venita Sheffield on Tue Sep 26, 2013  4:32 PM  ------       Message from: Faylene Kurtz       Created: Tue Sep 26, 2013  3:39 PM       Regarding: chapnick- rx- on back order- cream ok?       Contact: [phone]         pts script is on back order- the pharm stated they can get a cream please call                            Julie Freeman [id number], 37 year old, female, Telephone Information:       Home Phone      [phone]       Work Phone      Not on file.       Mobile          [phone]                     Patient's Preferred Pharmacy:               [id number] [name], [place] - [address]       Phone: [phone] Fax: [phone]                     CONFIRMED TODAY: No              CALL BACK NUMBER: [phone]       Best time to call back: asap       Cell phone:        Other phone:              Available times:              Patient's language of care: English              Patient does not need an interpreter.              Patient's PCP: Algis Downs              Person calling on behalf of patient: Pharmacy              Calls today to speak to nurse only.       to speak to provider only.       with questions and concerns.                       ------

## 2013-09-26 NOTE — Progress Notes (Signed)
ASSESSMENT: A 37 year old woman with dermatomyositis. She has areas of skin that appear to be almost sclerodermatous in nature, although this may be from long-term dermatomyositis changes. She has vitiligo, hyperpigmentation, and atrophy.   She is going to be going to the dermatologist. I have asked her to discuss the possibility of an overlap with scleroderma, although systemically, there is no evidence of organ involvement from scleroderma. I would like her scalp to be evaluated, as well as the lesion on her face. For her chest, she will try lactic acid lotion.   She has no evidence of pulmonary involvement at this time. She was encouraged to continue exercise.   Body habitus: There is likely a genetic component, but her dermatomyositis primarily affected her upper extremities, and her skin is more involved in the upper extremities, which may be contributing to the different appearance in the upper and lower body. I have asked her to consider meeting with a trainer or looking online for body sculpting exercises that may help define the musculature in her lower body. More repetitions and less weight would be helpful, as well.   Leg cramps: She will try diet tonic water. Showed her how to stretch out her calves and her hamstrings.   Increased bowel movements and flatus: It may be related to increasing her fiber. I recommend simethicone as needed.   Bone mass: This is likely benign, possibly a bone spur. I will check x-ray.   I will see her back in 3 months, earlier if needed.   Dictation Date and Time:08/21/2013 09:20:42   Transcription Date and Time:08/21/2013 11:32:30

## 2013-09-27 MED ORDER — AMMONIUM LACTATE 10 % EX CREA
15.0000 mL | TOPICAL_CREAM | Freq: Two times a day (BID) | CUTANEOUS | Status: DC | PRN
Start: 2013-09-27 — End: 2014-09-04

## 2013-10-06 ENCOUNTER — Encounter (HOSPITAL_BASED_OUTPATIENT_CLINIC_OR_DEPARTMENT_OTHER): Payer: Self-pay | Admitting: Internal Medicine

## 2013-10-20 ENCOUNTER — Ambulatory Visit (HOSPITAL_BASED_OUTPATIENT_CLINIC_OR_DEPARTMENT_OTHER): Payer: PRIVATE HEALTH INSURANCE | Admitting: Obstetrics & Gynecology

## 2013-10-20 ENCOUNTER — Ambulatory Visit (HOSPITAL_BASED_OUTPATIENT_CLINIC_OR_DEPARTMENT_OTHER): Payer: PRIVATE HEALTH INSURANCE

## 2013-10-25 ENCOUNTER — Other Ambulatory Visit (HOSPITAL_BASED_OUTPATIENT_CLINIC_OR_DEPARTMENT_OTHER): Payer: Self-pay | Admitting: Urology

## 2013-10-25 ENCOUNTER — Ambulatory Visit (HOSPITAL_BASED_OUTPATIENT_CLINIC_OR_DEPARTMENT_OTHER): Payer: PRIVATE HEALTH INSURANCE | Admitting: Internal Medicine

## 2013-10-25 NOTE — Progress Notes (Signed)
PER Pharmacy, Julie Freeman is a 38 year old female has requested a refill of   pentosan polysulfate (ELMIRON) 100 MG capsule        Last Office Visit: 09/14/2013  Last Physical Exam: 09/10/2011      Other Med Adult:  Most Recent BP Reading(s)  09/26/13 : 125/77      Cholesterol (mg/dL)   Date  Value    01/24/2013  124    ----------  LDL DIRECT (mg/dL)   Date  Value    01/24/2013  51    ----------  HDL (mg/dL)   Date  Value    01/24/2013  63*   ----------  TRIGLYCERIDES (mg/dl)   Date  Value    09/10/2011  51    ----------      No results found for this basename: TSHSC      No results found for this basename: TSH    HEMOGLOBIN A1C (%)   Date  Value    01/24/2013  5.0    ----------      INR (no units)   Date  Value    10/10/2010  1.1*   ----------       Documented patient preferred pharmacies:    [id number] [name],  - [place]  Phone: [phone] Fax: [phone]

## 2013-10-26 ENCOUNTER — Ambulatory Visit (HOSPITAL_BASED_OUTPATIENT_CLINIC_OR_DEPARTMENT_OTHER): Payer: PRIVATE HEALTH INSURANCE | Admitting: Dermatology

## 2013-10-26 DIAGNOSIS — M3390 Dermatopolymyositis, unspecified, organ involvement unspecified: Secondary | ICD-10-CM

## 2013-10-26 DIAGNOSIS — M3313 Other dermatomyositis without myopathy: Secondary | ICD-10-CM

## 2013-10-26 NOTE — Progress Notes (Signed)
Pt is safe at home

## 2013-10-26 NOTE — Progress Notes (Signed)
Keimora Madon is a 38 year old female patient.    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    Pt was last seen in clinic 1 month ago for the same. Since last visit, Pt reports that she has had some congestion and cold symptoms.     Since last visit, outside medical records were obtained and reviewed and clinical history is overall consistent with dermatomyositis.    Pt reports that since last visit she has been using clobetasol 0.05% scalp solution. She finds that this has been helpful with her scalp pruritus. She did not receive the derma-smoothe oil. She has been using the T-gel shampoo and she reports that this seems to be providing her some relief of itching as well.     With respect to her hands, she has been using clobeatsol 0.05% ointment twice daily  Since our last visit. She believes that her hands are peeling less than before, but she is not sure if this is because of the clobetasol or if due to using hand moisturizer as well.    She has no new skin related complaints or concerns. She denies fevers, chills, night sweats, nausea, vomiting, weight loss headaches, cough, shortness of breath.     ROS No recent fevers, chills, night sweats, weight loss, nausea, vomiting, diarrhea, headaches, cough, shortness of breath, arthralgias, myalgias      Past Medical History:    Past Medical History    Dermatomyositis     Shingles        Current Medications:    Current Outpatient Prescriptions:  pentosan polysulfate (ELMIRON) 100 MG capsule Take 1 capsule by mouth 3 (three) times daily before meals. Disp: 90 capsule Rfl: 11   Ammonium Lactate 10 % CREA Apply 15 mLs Topically 2 (two) times daily as needed. Disp: 1 Bottle Rfl: 11   clobetasol (TEMOVATE) 0.05 % external solution APPLY TOPICALLY TWO TIMES DAILY Disp: 50 mL Rfl: 3   methotrexate 2.5 MG tablet TAKE 10 TABLETS BY MOUTH ONCE A WEEK- IN DIVIDED DOSING Disp: 40 tablet Rfl: 3   predniSONE (DELTASONE) 1 MG tablet Take 2 tablets by  mouth daily. Disp: 60 tablet Rfl: 3   mycophenolate (CELLCEPT) 500 MG tablet Take 3 tablets by mouth 2 (two) times daily. Disp: 180 tablet Rfl: 3   hydroxychloroquine (PLAQUENIL) 200 MG tablet TAKE 2 TABLETS BY MOUTH EVERY DAY Disp: 60 tablet Rfl: 3   mupirocin (BACTROBAN) 2 % ointment APPLY TOPICALLY THREE TIMES DAILY Disp: 22 g Rfl: 11   methotrexate 2.5 MG tablet TAKE 10 TABLETS BY MOUTH ONCE A WEEK- IN DIVIDED DOSING Disp: 40 tablet Rfl: 3   Calcium Carbonate-Vitamin D 600-400 MG-UNIT TABS Tablet TAKE ONE (1) TABLET TWICE DAILY.  Do not take within a few hours of mycophenolate. Disp: 60 tablet Rfl: 11   Cholecalciferol (VITAMIN D) 2000 UNITS CAPS Take 1 capsule by mouth daily. Disp: 30 capsule Rfl: 11   folic acid (FOLVITE) 1 MG tablet Take 3 tablets by mouth daily. Dose increase Disp: 90 tablet Rfl: 11   multivitamin (THERAGRAN) per tablet TAKE ONE (1) TABLET DAILY Disp: 30 tablet Rfl: 11     No current facility-administered medications for this visit.    Allergies:  Review of Patient's Allergies indicates:   Cat dander              [facility] Problem List:  Active Problems:    *  No active hospital problems. *      Vitals:  There were no vitals taken for this visit.    Physical Exam  A focused dermatologic exam of the face, hands, scalp, chest, back, and upper extremities was performed today:  -AOx3   -On the scalp there are several discrete plaques of scarring alopecia. There are intermixed telangiectasias   - There are mottled occasional inflammatory pink patches with areas of depigmentation in the scalp   - Most prominently on the periorbital skin but also on the lateral aspects of the cheek there are dyspigmented patches with intermixed hyper, hyopigmentation and telangiectasias   - On the chest, in a V distribution, as well as on the posterior neck, there are dyspigmented patches with intermixed hyper/hypopigmentation and telangiectasias   - The face is somewhat cachetic appearing   - There is  normal oral aperture   - there is no heliotrope sign   - Bilateral upper extremities are very thin with apparent loss of the subcutaneous fat. 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   For now I recommended:   - For scalp, start derma-smoothe scalp oil, apply to scalp nightly rinse off each morning  - Continue clobetasol scalp solution, few drops 1-2 times daily PRN pruritus  - For hands, advised her not to use clobeatsol daily. Use up to 14 days per month for pruritus or flaring of hands  - Otherwise use ammonium lactate lotion or other hand moisturizer. Multiple samples provided to patient today  - Discussed importance of regular application of sunscreen during periods of sun exposure  -She is currently on plaquenil, methotrexate, cellcept, and prednisone, with significant subjective improvement in muscular function but persistent cutaneous symptoms   - Labs reviewed from last visit  - Will email PCP Dr.Phillips to ensure that Pt has up to date screening mammograms and pap smears  - Will contact ID with ? Regarding need for PCP prophylaxis given several immunosuppressive medications  .  RTC 1-2 months    Gunnar Bulla, MD  10/26/2013

## 2013-11-10 ENCOUNTER — Ambulatory Visit (HOSPITAL_BASED_OUTPATIENT_CLINIC_OR_DEPARTMENT_OTHER): Payer: PRIVATE HEALTH INSURANCE | Admitting: Internal Medicine

## 2013-11-10 VITALS — BP 130/88 | Wt 138.0 lb

## 2013-11-10 DIAGNOSIS — J309 Allergic rhinitis, unspecified: Secondary | ICD-10-CM

## 2013-11-10 MED ORDER — FLUTICASONE PROPIONATE 50 MCG/ACT NA SUSP
2.00 | Freq: Every day | NASAL | Status: AC
Start: 2013-11-10 — End: 2014-02-08

## 2013-11-10 NOTE — Patient Instructions (Signed)
Try Claritin or Allegra

## 2013-11-10 NOTE — Progress Notes (Signed)
Julie Freeman is a 38 year old female    Cc runny nose and congestion    New exposure to cats. No relief of rhinorrhea from antihistamines    well developed, well nourished  BP 130/88  Wt 138 lb (62.596 kg)  BMI 20.99 kg/m2  LMP 11/03/2013    No sinus tenderness  Sclerodermal skin changes    (477.9) Allergic rhinitis, cause unspecified  (primary encounter diagnosis)  Comment: try different antihistamine  Plan: trial of Flonase--call prn    I have reviewed the past medical, surgical, social and family history and updated these sections of EpicCare as relevant. All interim labs, test results, and consult notes were reviewed and discussed with Julie Freeman. Medications were reconciled during this visit and a current medication list was given to the patient at the end of the visit.              '

## 2013-11-14 ENCOUNTER — Ambulatory Visit (HOSPITAL_BASED_OUTPATIENT_CLINIC_OR_DEPARTMENT_OTHER): Payer: PRIVATE HEALTH INSURANCE | Admitting: Internal Medicine

## 2013-11-17 ENCOUNTER — Encounter (HOSPITAL_BASED_OUTPATIENT_CLINIC_OR_DEPARTMENT_OTHER): Payer: Self-pay

## 2013-11-17 ENCOUNTER — Ambulatory Visit (HOSPITAL_BASED_OUTPATIENT_CLINIC_OR_DEPARTMENT_OTHER): Payer: PRIVATE HEALTH INSURANCE | Admitting: Obstetrics & Gynecology

## 2013-11-17 ENCOUNTER — Ambulatory Visit (HOSPITAL_BASED_OUTPATIENT_CLINIC_OR_DEPARTMENT_OTHER): Payer: PRIVATE HEALTH INSURANCE

## 2013-11-17 ENCOUNTER — Encounter (HOSPITAL_BASED_OUTPATIENT_CLINIC_OR_DEPARTMENT_OTHER): Payer: Self-pay | Admitting: Obstetrics & Gynecology

## 2013-11-17 VITALS — BP 115/82 | Wt 137.0 lb

## 2013-11-17 DIAGNOSIS — Z30433 Encounter for removal and reinsertion of intrauterine contraceptive device: Secondary | ICD-10-CM

## 2013-11-17 DIAGNOSIS — Z3009 Encounter for other general counseling and advice on contraception: Secondary | ICD-10-CM

## 2013-11-17 DIAGNOSIS — Z30431 Encounter for routine checking of intrauterine contraceptive device: Secondary | ICD-10-CM

## 2013-11-17 NOTE — Progress Notes (Signed)
Reason for Visit:Julie Freeman is here to switch birth control.  Pt is a 38 year old, African American woman. Pt reports high blood pressure and diabetes on fathers side of family. Pt is currently using Mirena IUS , as birth control, for five years. Pt reports Mirena has been working for her with very low to no side effects.     Confidential Visit: No  Safe or Confidential Phone #: [phone]  Preferred Name: Julie Freeman  Preferred Pronoun: she    History:      Current Outpatient Prescriptions on File Prior to Visit:  fluticasone (FLONASE) 50 MCG/ACT nasal spray 2 sprays by Each Nostril route daily. Disp: 16 g Rfl: 3   pentosan polysulfate (ELMIRON) 100 MG capsule Take 1 capsule by mouth 3 (three) times daily before meals. Disp: 90 capsule Rfl: 11   Ammonium Lactate 10 % CREA Apply 15 mLs Topically 2 (two) times daily as needed. Disp: 1 Bottle Rfl: 11   clobetasol (TEMOVATE) 0.05 % external solution APPLY TOPICALLY TWO TIMES DAILY Disp: 50 mL Rfl: 3   methotrexate 2.5 MG tablet TAKE 10 TABLETS BY MOUTH ONCE A WEEK- IN DIVIDED DOSING Disp: 40 tablet Rfl: 3   predniSONE (DELTASONE) 1 MG tablet Take 2 tablets by mouth daily. Disp: 60 tablet Rfl: 3   mycophenolate (CELLCEPT) 500 MG tablet Take 3 tablets by mouth 2 (two) times daily. Disp: 180 tablet Rfl: 3   hydroxychloroquine (PLAQUENIL) 200 MG tablet TAKE 2 TABLETS BY MOUTH EVERY DAY Disp: 60 tablet Rfl: 3   mupirocin (BACTROBAN) 2 % ointment APPLY TOPICALLY THREE TIMES DAILY Disp: 22 g Rfl: 11   methotrexate 2.5 MG tablet TAKE 10 TABLETS BY MOUTH ONCE A WEEK- IN DIVIDED DOSING Disp: 40 tablet Rfl: 3   Calcium Carbonate-Vitamin D 600-400 MG-UNIT TABS Tablet TAKE ONE (1) TABLET TWICE DAILY.  Do not take within a few hours of mycophenolate. Disp: 60 tablet Rfl: 11   Cholecalciferol (VITAMIN D) 2000 UNITS CAPS Take 1 capsule by mouth daily. Disp: 30 capsule Rfl: 11   folic acid (FOLVITE) 1 MG tablet Take 3 tablets by mouth daily. Dose increase Disp: 90 tablet Rfl: 11    multivitamin (THERAGRAN) per tablet TAKE ONE (1) TABLET DAILY Disp: 30 tablet Rfl: 11     No current facility-administered medications on file prior to visit.  Over the Counter or Herbal Medications: No  Review of Patient's Allergies indicates:   Cat dander              Runny Nose    Substance Use:       Alcohol Use: No   Comment: rare       Drug Use: No       Smoking status: Never Smoker    Smokeless tobacco: Not on file       GYN/GU:  Female or Female [place] Sex: female    Social:  Home Life: Pt lives with brother and roommates in [place].  Work: No  School: Pt attends [employer], Psychiatrist.  Mental Health: Denies     Sexual History:    Sexual Activity: Yes   Partners: Female   Patent examiner Protection: IUD       Age of First Intercourse: Denies  Current Partner(s): Pt has been in a relationship with current partner, since January 2015. Pt's partner supports birth control use and is satisfied with current method.  Current Sexual Activity: Vaginal, Oral  Past Sexual Practices: Vaginal, Anal, Oral  # of Partners (Lifetime):  14    Satisfaction with Current Method(s): Yes  Side Effects/Problems: No  Using Method Correctly: Yes  Contraceptive Use History: OCP (estrogen/progesterone), condoms and IUD  Contraindications Screening (as applicable): Yes    Date of Last Sexual Activity: about five days ago  Protection Used: No  EC Need: No  Condom Use:  Sometimes  Condom Breakage: No  Condom Instruction Given: yes    Sexual Concerns or Questions: No    History of STIs:    Tested Before?:  Yes:   Component      Latest Ref Rng 05/10/2012   GENPROBE GC       Negative for Neisseria gonorrhoeae rRNA.   GENPROBE CHLAMYDIA       Negative for Chlamydia trachomatis rRNA.   HIV 1/2 PLUS O AG/AB SCREEN      NONREACTIVE    HIV MULTISPOT      NONREACTIVE    TREPONEMA PALLIDUM AB IgG      NONREACTIVE      Component      Latest Ref Rng 01/24/2013   GENPROBE GC          GENPROBE CHLAMYDIA          HIV 1/2 PLUS O AG/AB SCREEN       NONREACTIVE NON-REACT   HIV MULTISPOT      NONREACTIVE NOT INDICATED   TREPONEMA PALLIDUM AB IgG      NONREACTIVE NON-REACTIVE     Ever Treated?:  No  Partner Ever Treated: No  Testing Offered: Yes  Testing Accepted: No     -Disc: Transmission (Blood, Semen, Vaginal Fluid and Breastmilk), Risky Behaviors, Non-Risks/Myths,  Seroconversion Period , Result Waiting Time, Possible Results, Feelings Surrounding Possible Results, Plans for Informing of Results, Support Structures and Hep C Risk.    Risk Assessment Completed  Piercing/Tattoo: No  Known Partner Risks: No  IV/Intranasal Drug Use: No  Partner IV/Intranasal Drug Use: No  History of Incarceration or Partner Incarceration: No  Risk Reduction Planning Completed     DV Assessment:  Hx of DV/IP Abuse:  No  Hx of Sexual Assault:  No  Sexual Coercion: No  Birth Control Coercion:No  Received Money, Food, Shelter, or Drugs for Sex: No    Education/Counseling:  Importance of health maintenance and prevention PE  Anatomy/Physiology  GYN Exam  Pregnancy  Preconception Counseling  Contraceptive overview: Risks, Benefits, Warning Signs, Effectiveness, Side Effects, Instructions, Follow Up  Informed Consent signed for:  Family Planning and Mirena  STD/HIV Risk Assessment Done  STD/HIV prevention/safer sex discussed  Condoms offered and  Declined  Emergency Contraception Discussed  Communication with parent or other supportive adult encouraged  Confidentiality  Clinic contact number given, for both daytime and after hours/emergencies  Handouts given    Plan:  Next PE Due: Pt will schedule next PE.   Testing Done: No  Prescriptions Done: No      Referrals: No  Follow Up Plan:   Information and education given on all birth control methods. Questions and concerns were addressed. Pt has chosen to use Mirena IUS. Instructions for use, effectiveness, side-effects, what to expect, and use of backup barrier methods were discussed.  Pt has no contraindications to this medicine. Pt  understands method and use. Pt signed consent form.    Pt was advised to see a nurse/ PCP if in pain or any type of unusual discomfort. Pt will see medical provider today. Pt reports experiencing no pain, unusual odors, feelings or discharge.  Patient will return to clinic as needed or call with questions or concerns.    Pooja Camuso Bien-Aime, HC

## 2013-11-21 ENCOUNTER — Ambulatory Visit (HOSPITAL_BASED_OUTPATIENT_CLINIC_OR_DEPARTMENT_OTHER): Payer: PRIVATE HEALTH INSURANCE | Admitting: Internal Medicine

## 2013-11-21 ENCOUNTER — Other Ambulatory Visit (HOSPITAL_BASED_OUTPATIENT_CLINIC_OR_DEPARTMENT_OTHER): Payer: Self-pay | Admitting: Internal Medicine

## 2013-11-21 MED ORDER — LEVONORGESTREL 20 MCG/24HR IU IUD
1.00 | INTRAUTERINE_SYSTEM | INTRAUTERINE | Status: AC
Start: 2013-11-21 — End: 2018-11-20

## 2013-11-21 NOTE — Patient Instructions (Signed)
Fact Sheet  The Copper-T or ParaGard Intrauterine Device   (the IUD)    What is the IUD?   The Copper-T/ParaGard intrauterine device (IUD) is a method of birth control.  The IUD is made of plastic and copper.  It is shaped like the letter "T" and must be put into a woman's uterus by a health care provider.  It can stay in the uterus to prevent pregnancy for up to 10 years.  This method of birth control is reversible meaning you can get pregnant once you decide to stop using the IUD and a health care provider takes it out.     How does the IUD work?  The IUD stops sperm from moving through the uterus and reaching the egg.  Sometimes the IUD may also stop an egg from attaching in the uterus and developing into a pregnancy.    How well does the IUD work to prevent pregnancy?  The IUD works very well to prevent pregnancy.  If 100 women use the IUD for one year only one woman might get pregnant.  The IUD is one of the most effective birth control methods available.     What do women like about the IUD?  The IUD is a very effective method of birth control.   This method of birth control is reversible (not permanent).  The IUD is private (no one but you needs to know).  You do not need to interrupt sex to use an IUD.  You can usually get pregnant soon after the IUD is taken out.   The IUD can be used by women who cannot or do not want to use birth control methods that contain hormones, such as the pill, patch, ring, shot, implant, or the Mirena IUD.   The IUD works for up to 10 years, but you can have a health care provider take it out at any time during the 10 years if you no longer want to use it to prevent pregnancy.   Women who are breast feeding can use the IUD.    What do women not like about the IUD?  The IUD DOES NOT protect you from HIV and other sexually transmitted    diseases (STDs).    This method should NOT be used by women who are at risk of getting a sexually transmitted disease (STD) from their sexual  partner.  The IUD may cause changes in your periods like bleeding or spotting between periods, longer or heavier periods, or more cramps with your periods.   The IUD must be put into the uterus and taken out by a health care provider, which can be uncomfortable and has risks.      What are the possible side effects of using the IUD?Cramps, dizziness, or faintness when the IUD is put into the uterus  Changes in your period like  spotting, heavier or longer flow, and cramps   Your partner may feel the IUD threads during sex  More vaginal discharge  Anemia if you have more    blood loss during your period  Back pain  Rash    Sometimes the IUD can cause the following serious health problems:  Rarely, when it is put in by your health care provider the IUD can go through the wall of the uterus (perforation), causing infection, scarring, or damage to other organs.  If a pregnancy happens while the IUD is in the uterus, the pregnancy may be an ectopic pregnancy (pregnancy outside the   uterus).  This may cause miscarriage, serious infection in the uterus or the blood, or premature delivery.  The IUD can fall out of the uterus by itself leaving a woman at risk of pregnancy.  If you get an STD while you use the IUD this could cause pelvic inflammatory disease (PID), an infection of the fallopian tubes.  PID might cause future ectopic pregnancy, abdominal pain, and problems getting pregnant.     Return to the health center as soon as possible if you have any of the following:  Lower stomach pain   Pain during sex  An unexplained fever  Heavy vaginal bleeding or bleeding that lasts longer than expected  Vaginal discharge that is not normal for you   Miss a monthly period or think you might be pregnant  Cannot feel the IUD threads or the threads seem much longer  Can feel any part of the IUD other than the threads (hard plastic)    Contact your health care provider if you think you may have a sexually transmitted disease (STD) or  if you want to stop using the IUD and start using another method.    To decrease your risk of HIV and other sexually transmitted diseases, use a latex condom every time you have vaginal, anal, or oral sex.    Emergency contraception (EC) prevents pregnancy when used no later than 3-5 days after unprotected sex.  If you need EC or want more information, call your health care provider, family planning counselor, or pharmacist.

## 2013-11-21 NOTE — Progress Notes (Signed)
PATIENT/PROCEDURE VERIFICATION DOCUMENTATION    Correct patient: Yes  Correct procedure: Yes  Correct side, site, mark visible if applicable: N/A  Correct position: N/A  Special equipment/implant(s) present, if applicable: Yes    Time-out completed, documented by provider doing procedure or designated team member:  Julie Screws, MD          38 year old female is here for an IUD insertion using for management of DUB not conraception- same sex partner. Reason(s) for IUD: menorrhagia. Currently she is sexually active, monogamous and female partner. She denies current symptoms of possible pelvic infection, recent pelvic infection, pregnancy and allergy to IUD materials. Her last unprotected sexual encounter was N/A. Patient's last menstrual period was 11/03/2013. Labs completed include: negative pregnancy test and nl pap.    Obstetric History   G1  P1  T0  P0  A0  TAB0  SAB0  E0  M0  L1     Patient has verbalized understanding of risks and benefits and has signed the consent form. Current statistical risk of pregnancy with IUD is 0.3-1%. Current statistical risk of ectopic pregnancy with IUD is low but if pregnancy occurs ectopic risk is about 35%.    EXAM:  Uterus: anteverted, normal size and non-tender.  IUD strings visible    PROCEDURE:  IUD strings visualized grasped-uneventful removal of Mirena  IUD type: Mirena. Cervix prepped with betadine. Tenaculum applied to anterior lip of the cervix. Uterus Sounds to 6.5cm. IUD inserted with minor difficulties due to cervical stenosis. Strings trimmed.    POST PAIN ASSESSMENT:  Post pain assessment done. Patient rates pain as a 2 on a 0-10 pain scale. Estimated blood loss: 2cc.  Will obtain USG to confirm correct location    DISPOSITION AND FOLLOW UP:  Instructions given to patient regarding checking IUD strings, anticipated vaginal bleeding and warning signs. Instructed to call for any concerns. Follow up appointment in 6 weeks.

## 2013-11-21 NOTE — Progress Notes (Signed)
Date of Service: 08/21/2013   HISTORY OF PRESENT ILLNESS: This is a follow-up visit for this 38 year old woman with dermatomyositis.

## 2013-11-22 ENCOUNTER — Ambulatory Visit (HOSPITAL_BASED_OUTPATIENT_CLINIC_OR_DEPARTMENT_OTHER): Payer: Self-pay | Admitting: Obstetrics & Gynecology

## 2013-11-30 ENCOUNTER — Ambulatory Visit (HOSPITAL_BASED_OUTPATIENT_CLINIC_OR_DEPARTMENT_OTHER): Payer: Self-pay | Admitting: Internal Medicine

## 2013-12-15 ENCOUNTER — Telehealth (HOSPITAL_BASED_OUTPATIENT_CLINIC_OR_DEPARTMENT_OTHER): Payer: Self-pay | Admitting: "Women's Health Care

## 2013-12-15 ENCOUNTER — Telehealth (HOSPITAL_BASED_OUTPATIENT_CLINIC_OR_DEPARTMENT_OTHER): Payer: Self-pay

## 2013-12-15 NOTE — Progress Notes (Signed)
Call back from patient.  Appt scheduled

## 2013-12-15 NOTE — Progress Notes (Signed)
Matalie, Romberger - 12/15/2013 12:15 PM ','<<< Less Detail           [name] ([phone])           Sent: Fri December 15, 2013 1:13 PM     To: Joesph Fillers Rn Pool                              Message       This patient declined USG to check IUD placement at least needs her IUD check- I dont see one scheduled     ----- Message -----     From: Alinda Dooms     Sent: 12/15/2013 12:17 PM     To: [name]

## 2013-12-15 NOTE — Progress Notes (Signed)
Detailed message left on patient's identified voice mail requesting a call back to schedule an appointment for an IUD check.

## 2013-12-15 NOTE — Progress Notes (Signed)
Patient states that she is not having any problem with IUD. Don't need this ultrasound appointment.   Julie Freeman UA

## 2013-12-20 ENCOUNTER — Ambulatory Visit (HOSPITAL_BASED_OUTPATIENT_CLINIC_OR_DEPARTMENT_OTHER): Payer: PRIVATE HEALTH INSURANCE | Admitting: Obstetrics & Gynecology

## 2013-12-20 VITALS — BP 120/70 | Wt 140.0 lb

## 2013-12-20 DIAGNOSIS — Z30431 Encounter for routine checking of intrauterine contraceptive device: Secondary | ICD-10-CM

## 2013-12-20 NOTE — Progress Notes (Signed)
CC: IUD check    HPI:  38 yo s/p Mirena removal and reinsertion 2/6.  Given more difficult insertion [place] was ordered to confirm placement.   The patient ultimately decided she didn't need that and couldn't make it.    She reports bleeding intermittently since the placement, more than before her old IUD was removed.    No pain except for breast tenderness    She uses the IUD for control of menorrhagia and is in a same sex relationship.    PE:  Alert, oriented, NAD  Diffuse skin changes  Pelvic:   NEFG without lesions   Vagina with normal discharge   Cervix without lesions, strings present    A/P:  38 yo with IUD for control of menstrual bleeding.  Encouraged to let us know if the bleeding doesn't improve.

## 2013-12-21 ENCOUNTER — Other Ambulatory Visit (HOSPITAL_BASED_OUTPATIENT_CLINIC_OR_DEPARTMENT_OTHER): Payer: Self-pay | Admitting: Internal Medicine

## 2013-12-26 ENCOUNTER — Ambulatory Visit (HOSPITAL_BASED_OUTPATIENT_CLINIC_OR_DEPARTMENT_OTHER): Payer: PRIVATE HEALTH INSURANCE | Admitting: Urology

## 2013-12-26 ENCOUNTER — Other Ambulatory Visit (HOSPITAL_BASED_OUTPATIENT_CLINIC_OR_DEPARTMENT_OTHER): Payer: Self-pay | Admitting: Internal Medicine

## 2013-12-26 ENCOUNTER — Telehealth (HOSPITAL_BASED_OUTPATIENT_CLINIC_OR_DEPARTMENT_OTHER): Payer: Self-pay | Admitting: Internal Medicine

## 2013-12-26 DIAGNOSIS — M3313 Other dermatomyositis without myopathy: Secondary | ICD-10-CM

## 2013-12-26 DIAGNOSIS — M3390 Dermatopolymyositis, unspecified, organ involvement unspecified: Secondary | ICD-10-CM

## 2013-12-26 MED ORDER — FLUOCINONIDE 0.05 % EX SOLN
Freq: Two times a day (BID) | CUTANEOUS | Status: AC
Start: 2013-12-26 — End: 2014-01-25

## 2013-12-26 NOTE — Progress Notes (Signed)
PER Pharmacy,   Julie Freeman is a 38 year old female who has requested a refill of:    Flucinonide .05% solution  This medication has been reordered with the same quantity and end date as the last time it was prescribed.       Last Office Visit: 10/26/13  Last Physical Exam: 09/10/11      Other Med Adult:  Most Recent BP Reading(s)  12/20/13 : 120/70      Cholesterol (mg/dL)   Date  Value    01/24/2013  124    ----------  LDL DIRECT (mg/dL)   Date  Value    01/24/2013  51    ----------  HDL (mg/dL)   Date  Value    01/24/2013  63*   ----------  TRIGLYCERIDES (mg/dl)   Date  Value    09/10/2011  51    ----------      No results found for this basename: TSHSC      No results found for this basename: TSH    HEMOGLOBIN A1C (%)   Date  Value    01/24/2013  5.0    ----------      INR (no units)   Date  Value    10/10/2010  1.1*   ----------       Documented patient preferred pharmacies:    [id number] [name], [place] - [place]  Phone: [phone] Fax: [phone]

## 2013-12-26 NOTE — Telephone Encounter (Signed)
Message copied by Josie Saunders on Tue Dec 26, 2013  3:02 PM  ------       Message from: Reece Levy       Created: Tue Dec 26, 2013 11:51 AM       Regarding: DR Corey Harold         Person calling on behalf of patient: Pharmacy              Medicine Name: FLUOCINONIDE        Dosage: 0.05% SOLUTION       Frequency (how many pills, how many times a day):        Number of pills left:        Documented patient preferred pharmacies:       No Pharmacies Listed       Pharmacy Name: [facility] Telephone Number: [phone]       Pharmacy  Fax Number: [phone]              CALL BACK NUMBER:        Cell phone:        Other phone:              Available times:              Patient's language of care: Data Unavailable              Patient does not need an interpreter.                              ------

## 2013-12-26 NOTE — Progress Notes (Signed)
Person calling on behalf of patient: Pharmacy    Marisela Line is a 38 year old female     Please review:   Per epic, there isn't any history of patient being prescribed this in the past.    - medication(s) request: Derma smooth scalp oil  - last office visit: 12/20/2013  - last physical exam: 09/10/2011      Other Med Adult:  Most Recent BP Reading(s)  12/20/13 : 120/70      Cholesterol (mg/dL)   Date  Value    01/24/2013  124    ----------  LDL DIRECT (mg/dL)   Date  Value    01/24/2013  51    ----------  HDL (mg/dL)   Date  Value    01/24/2013  63*   ----------  TRIGLYCERIDES (mg/dl)   Date  Value    09/10/2011  51    ----------      No results found for this basename: TSHSC      No results found for this basename: TSH    HEMOGLOBIN A1C (%)   Date  Value    01/24/2013  5.0    ----------      INR (no units)   Date  Value    10/10/2010  1.1*   ----------           Documented patient preferred pharmacies:    [id number] - [place], [place] [address]  Phone: [phone] Fax: [phone]

## 2013-12-26 NOTE — Telephone Encounter (Signed)
Message copied by Ernestine Conrad on Tue Dec 26, 2013  3:00 PM  ------       Message from: Reece Levy       Created: Tue Dec 26, 2013 11:51 AM       Regarding: DR Corey Harold         Person calling on behalf of patient: Pharmacy              Medicine Name: FLUOCINONIDE        Dosage: 0.05% SOLUTION       Frequency (how many pills, how many times a day):        Number of pills left:        Documented patient preferred pharmacies:       No Pharmacies Listed       Pharmacy Name: [facility] Telephone Number: [phone]       Pharmacy  Fax Number: [phone]              CALL BACK NUMBER:        Cell phone:        Other phone:              Available times:              Patient's language of care: Data Unavailable              Patient does not need an interpreter.                              ------

## 2013-12-26 NOTE — Telephone Encounter (Signed)
Message copied by Ernestine Conrad on Tue Dec 26, 2013  2:05 PM  ------       Message from: Roma Schanz       Created: Tue Dec 26, 2013  1:25 PM       Regarding: DERMATOLOGY/STEVART       Contact: [place]              Person calling on behalf of patient: Patient (self)              May list multiple medications in this section              Medicine Name: [facility] SMOOTHE SCALP OIL              Dosage: ?              Frequency (how many pills, how many times a day):shampoo nightly              Number of pills left: none              Documented patient preferred pharmacies:        [id number] [place], [place] [address]       Phone: [phone] Fax: [phone]                     Pharmacy Name: [facility] Telephone Number: [phone]              Pharmacy  Fax [place]                ------

## 2013-12-29 ENCOUNTER — Ambulatory Visit (HOSPITAL_BASED_OUTPATIENT_CLINIC_OR_DEPARTMENT_OTHER): Payer: PRIVATE HEALTH INSURANCE | Admitting: Obstetrics & Gynecology

## 2014-01-02 ENCOUNTER — Ambulatory Visit (HOSPITAL_BASED_OUTPATIENT_CLINIC_OR_DEPARTMENT_OTHER): Payer: Self-pay | Admitting: Internal Medicine

## 2014-01-12 ENCOUNTER — Encounter (HOSPITAL_BASED_OUTPATIENT_CLINIC_OR_DEPARTMENT_OTHER): Payer: Self-pay | Admitting: Internal Medicine

## 2014-01-12 ENCOUNTER — Ambulatory Visit (HOSPITAL_BASED_OUTPATIENT_CLINIC_OR_DEPARTMENT_OTHER): Payer: PRIVATE HEALTH INSURANCE | Admitting: Internal Medicine

## 2014-01-12 DIAGNOSIS — N63 Unspecified lump in unspecified breast: Secondary | ICD-10-CM

## 2014-01-12 DIAGNOSIS — R600 Localized edema: Secondary | ICD-10-CM

## 2014-01-12 DIAGNOSIS — L84 Corns and callosities: Secondary | ICD-10-CM

## 2014-01-14 NOTE — Progress Notes (Signed)
Julie Freeman is a 38 year old female here for follow up for multiple dx:  1.  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  Feels that lower back bone is sticking out  No pain  No trauma    3.  Painful calluses bilaterally on feet    ROS:  Denies any chest pain, sob on exertion, headaches, claudication, raynoud's    There were no vitals filed for this visit.  Gen:NAD  Eyes; anicteric  Skin: erythema, shiny on UE  Breast:  RUE with papule on 9 pm, and clusters of 3 lumps at 4 pm peripheral  CV:RRR  Pulm: CTA bilaterally  SPine: No spinal tenderness  LE:  + edema, non-pitting, close to the ankles, feet cool to touch bilaterally, +1 DP bilaterally  Calluses bilaterally    Julie Freeman is a 38 year old female here for follow-up  Breast lumps:  Ref to breast center  U/s of breast    Calluses:  Ref to podiatry    LE edema:  Unclear of cause  ? If part of dermatomyositis syndrome vs. Venous stasis secondary to varicose  Also LE is cooler than proximal LE - no claudication though    This was a 30 minute visit, 50% face to face discussing the above dx.    Julie Freeman is a 38 year old female here for follow-up  LE edema:  No workup noted in this system  To start with echo  No medications could cause LE edema  Plaquenil could cause cardiomyopathy - unlikely    Calluses:  Ref to podiatry

## 2014-01-17 ENCOUNTER — Ambulatory Visit (HOSPITAL_BASED_OUTPATIENT_CLINIC_OR_DEPARTMENT_OTHER): Payer: Self-pay | Admitting: Surgery

## 2014-01-17 VITALS — BP 110/70 | Temp 98.7°F

## 2014-01-17 DIAGNOSIS — N6310 Unspecified lump in the right breast, unspecified quadrant: Secondary | ICD-10-CM

## 2014-01-17 NOTE — Progress Notes (Signed)
Surgical History and Physical    CC: Right breast mass    HPI:The  patient is a 38 year old female who presented to the clinic with complaints of a 3 week history of noting a right breast mass.  She states that she and her girlfriend noted this as a new change. She is concerned because she understands that there is a higher chance of malignancywith her  She has having breast tenderness bilaterally since February when she had her mirena changed out. She is on the mirena secondary to severe period cramps.  She went to her PCP on 01/12/14 who also noted the mass.  She has noted no nipple changes or discharge.      She did have a mammogram sometime before 2007-2010 that she states was normal.  She has note of 06/22/2006 in media stating a left breast biopsy with lymphocytic infiltrate and atypical features.  She had a lumpectomy in 2007 after this that she was told was benign- she believes she had this at [facility].      She began menarche at age 19 and her last period was in March.  Her uterus and ovaries are intact.  She is a G1 P1 and had her first baby at age 79.  She did not breast feed.  She took birth control for 1 year.  She has not been on hormone replacement.      She has no family history of breast or ovarian cancer.        Past Medical History    Dermatomyositis     Shingles          Past Surgical History    BREAST LUMPECTOMY       Review of Patient's Allergies indicates:   Cat dander              Runny Nose    Social History:     Smoking status: Never Smoker     Smokeless tobacco: Never Used    Alcohol Use: No    Comment: rare       Family History    No Known Family History Mother     Diabetes Father        (Not in a hospital admission)      Review of Systems:   GENERAL: negative for fevers, chills, night sweats, headaches  CV: negative for chest pain, palpitations  PULM: negative for SOB, difficulty breathing, wheezes  ABD: negative for abd pain, nausea, vomiting, diarrhea,  constipation      PE:  VITALS: There were no vitals taken for this visit.  GEN:NAD alert and oriented x3 skin changes consisitent with dermatomyositis  HEENT:PERRLA, NCAT glasses  SKIN:warm and well perfused, markings consistent with dermatomyositis  CV:RRR no m/r/g  PULM:CTAB no w/r/r    BREAST: Right breast with dominant mass noted at 5 o'clock 2 cm from nipple 1.5 cm in size.  No axillary adenopathy on either side.  Left breast without dominant mass. No nipple abnormalities       A/P: The patient is a 38 year old female with a new breast mass noted on the right breast.  I have ordered bilateral screening mammogram and right breast ultrasound.  She will return to see me after the results.      Vassie Moment, MD

## 2014-01-19 ENCOUNTER — Ambulatory Visit (HOSPITAL_BASED_OUTPATIENT_CLINIC_OR_DEPARTMENT_OTHER): Payer: Self-pay | Admitting: Internal Medicine

## 2014-01-19 LAB — ECHOCARDIOGRAM W/ DOPPLER

## 2014-01-22 ENCOUNTER — Ambulatory Visit (HOSPITAL_BASED_OUTPATIENT_CLINIC_OR_DEPARTMENT_OTHER): Payer: Self-pay | Admitting: Surgery

## 2014-01-22 ENCOUNTER — Other Ambulatory Visit (HOSPITAL_BASED_OUTPATIENT_CLINIC_OR_DEPARTMENT_OTHER): Payer: Self-pay | Admitting: Internal Medicine

## 2014-01-23 ENCOUNTER — Ambulatory Visit (HOSPITAL_BASED_OUTPATIENT_CLINIC_OR_DEPARTMENT_OTHER): Payer: Self-pay | Admitting: Surgery

## 2014-01-23 ENCOUNTER — Telehealth (HOSPITAL_BASED_OUTPATIENT_CLINIC_OR_DEPARTMENT_OTHER): Payer: Self-pay | Admitting: ESP Geriatrics

## 2014-01-23 ENCOUNTER — Ambulatory Visit (HOSPITAL_BASED_OUTPATIENT_CLINIC_OR_DEPARTMENT_OTHER): Payer: PRIVATE HEALTH INSURANCE | Admitting: Internal Medicine

## 2014-01-23 ENCOUNTER — Encounter (HOSPITAL_BASED_OUTPATIENT_CLINIC_OR_DEPARTMENT_OTHER): Payer: Self-pay | Admitting: Internal Medicine

## 2014-01-23 VITALS — BP 130/78 | Wt 133.0 lb

## 2014-01-23 VITALS — HR 97 | Temp 98.3°F

## 2014-01-23 DIAGNOSIS — N898 Other specified noninflammatory disorders of vagina: Secondary | ICD-10-CM

## 2014-01-23 DIAGNOSIS — R319 Hematuria, unspecified: Secondary | ICD-10-CM

## 2014-01-23 DIAGNOSIS — M3313 Other dermatomyositis without myopathy: Secondary | ICD-10-CM

## 2014-01-23 DIAGNOSIS — Z5181 Encounter for therapeutic drug level monitoring: Secondary | ICD-10-CM

## 2014-01-23 DIAGNOSIS — M3390 Dermatopolymyositis, unspecified, organ involvement unspecified: Secondary | ICD-10-CM

## 2014-01-23 DIAGNOSIS — N6310 Unspecified lump in the right breast, unspecified quadrant: Secondary | ICD-10-CM

## 2014-01-23 DIAGNOSIS — R350 Frequency of micturition: Secondary | ICD-10-CM

## 2014-01-23 LAB — CBC WITH PLATELET
HEMATOCRIT: 36.1 % (ref 34.1–44.9)
HEMOGLOBIN: 12.4 g/dL (ref 11.2–15.7)
MEAN CORP HGB CONC: 34.3 g/dL (ref 31.0–37.0)
MEAN CORPUSCULAR HGB: 32.6 pg (ref 26.0–34.0)
MEAN CORPUSCULAR VOL: 95 fL (ref 80.0–100.0)
MEAN PLATELET VOLUME: 10.1 fL (ref 8.7–12.5)
PLATELET COUNT: 251 10*3/uL (ref 150–400)
RBC DISTRIBUTION WIDTH STD DEV: 45 fL (ref 35.1–46.3)
RBC DISTRIBUTION WIDTH: 13.1 % (ref 11.5–14.3)
RED BLOOD CELL COUNT: 3.8 M/uL — ABNORMAL LOW (ref 3.90–5.20)
WHITE BLOOD CELL COUNT: 6 10*3/uL (ref 4.0–11.0)

## 2014-01-23 LAB — RBC SEDIMENTATION RATE: RBC SEDIMENTATION RATE: 16 MM/HR — ABNORMAL HIGH (ref 0–15)

## 2014-01-23 MED ORDER — MYCOPHENOLATE MOFETIL 500 MG PO TABS
ORAL_TABLET | ORAL | Status: DC
Start: 2014-01-23 — End: 2014-05-17

## 2014-01-23 MED ORDER — VITAMIN D 50 MCG (2000 UT) PO CAPS
1.0000 | ORAL_CAPSULE | Freq: Every day | ORAL | Status: DC
Start: 2014-01-23 — End: 2014-09-04

## 2014-01-23 MED ORDER — FOLIC ACID 1 MG PO TABS
3.0000 mg | ORAL_TABLET | Freq: Every day | ORAL | Status: DC
Start: 2014-01-23 — End: 2014-05-12

## 2014-01-23 MED ORDER — METHOTREXATE 2.5 MG PO TABS
ORAL_TABLET | ORAL | Status: DC
Start: 2014-01-23 — End: 2014-05-17

## 2014-01-23 NOTE — Progress Notes (Signed)
Julie Freeman is a 38 year old female    Patient presents with:    Vaginal Problem - discharge      Patient Active Problem List:     Dermatomyositis     Scleroderma     Breast nodule     Shingles     Proteinuria     Seasonal allergies     IUD surveillance     Bilateral ovarian simple cysts     PPD negative     Vulvodynia     Impacted cerumen     Foreign body in ear     Dysphagia     Urinary urgency        Here for evaluation of white vaginal discharge. It has been on and off like this for many years. No changes recently.   Never had pain, no vaginal pruritus. Denies any different odor.   Just finishing menses. Has regular menses. Has new Mirena IUD, last about a week and not as heavy as is used to be before Mirena placed.  The vaginal discharge never bothered her.  Here as her girlfriend told her she should come in to have this checked.      right breast mass follow up imaging- never had this done. Patient says has appointment in one hour for this, plans to go after our visit.           Physical Exam   Vitals reviewed.  Constitutional: She appears well-developed. No distress.   Genitourinary: There is no rash, tenderness or lesion on the right labia. There is no rash, tenderness or lesion on the left labia. There is bleeding around the vagina.         ASSESSMENT/PLAN:  (623.5) Leukorrhea, vaginal, noninfectious  (primary encounter diagnosis)  Comment: unable to get wet prep today due to menstrual bleeding. Reassured patient that this sounds like non infectious leukorrhea.  This may or may not be associated with Mirena IUD and needs no Rx. Return instructions provided.     She is encouraged to go and have breast imaging studies now and she plans to do this.         We discussed the patient's current medications. The patient expressed understanding and no barriers to adherence were identified.

## 2014-01-23 NOTE — Telephone Encounter (Signed)
Message copied by Loletha Grayer on Tue Jan 23, 2014 12:21 PM  ------       Message from: [name]       Created: Tue Jan 23, 2014 10:31 AM       Regarding: [place] pt; pharmacy call       Contact: [place] [id number], 38 year old, female, Telephone Information:       Home Phone      [phone]       Work Phone      Not on file.       Mobile          [phone]                     Patient's Preferred Pharmacy:               [id number] [place], [place] - [address]       Phone: [phone] Fax: [phone]                     CONFIRMED TODAY: Yes              CALL BACK NUMBER: [phone]       Best time to call back: any       Cell phone:        Other phone:              Available times:              Patient's language of care: English              Patient does not need an interpreter.              Patient's PCP: Lynnda Shields              Person calling on behalf of patient: Pharmacy Krist[facility]              Calls today b/c clobetasol solution needs prior auth, however clobetasol lotion is covered without auth                                            ------

## 2014-01-23 NOTE — Progress Notes (Signed)
The primary progress note for this visit has been dictated through E-Scription. It can be viewed as an attachment to this encounter or through Chart Review under the NOTE Tab as an Rheumatology Office Note.      Review of Systems: Constitutional, Eyes, ENT/Mouth, Cardiovascular, Respiratory, GI, GU, Neuro, Psych, Heme/Lymph, Skin, Musculoskeletal, and Endocrine systems were reviewed and are NEGATIVE, except for what is dictated in the note.

## 2014-01-23 NOTE — Progress Notes (Signed)
Date of Service: 01/23/2014    HISTORY OF PRESENT ILLNESS:  This is a follow-up visit for this 38 year old woman with dermatomyositis after she was called and asked to come in because she has been lost to followup and needs labs checked.      She remains on mycophenolate 1500 mg twice a day, methotrexate 10 mg weekly, and hydroxychloroquine 400 mg daily.  At her last visit, she was on 2 mg alternating with 1 mg of prednisone.  She came off of prednisone at least 1 month ago and has been feeling well.    She denies any new or concerning symptoms today.    Interim history includes being evaluated for urinary frequency.  She briefly had microscopic hematuria.  She had an office cystoscopy that demonstrated erythema.  She had a surgical cystoscopy with biopsy evaluation that showed chronic inflammation.  She was placed on pentosan.    Last PFTs showed mild obstruction.  She denies cough, dyspnea, and chest pain, denies dysphagia.    PAST MEDICAL HISTORY:    1.  Shingles on the face complicated by secondary cellulitis  2.  Breast lump, thought to be related to dermatomyositis   3.  Proteinuria  4.  Seasonal allergies.  5.  Anemia  6.  Erythema nodosum  7.  Focal bladder erythema with chronic inflammation    SOCIAL HISTORY:  No alcohol, no tobacco.    ALLERGIES AND ADVERSE REACTIONS:  No known drug allergies.    CURRENT MEDICATIONS:  Calcium, vitamin D, clobetasol p.r.n. fluocinonide p.r.n., fluticasone nasal spray p.r.n., folic acid, hydroxychloroquine 400 mg, Mirena IUD, methotrexate 25 mg weekly, mupirocin p.r.n., mycophenolate 1500 mg twice a day, pentosan.    PHYSICAL EXAMINATION:  VITAL SIGNS:  Temperature 98.3, SpO2 of 97%, pulse 97.  GENERAL:  She is in no acute distress.  She is alert and oriented.  Good eye contact.  Full affect.  HEENT:  On the face there are color changes with hyperpigmentation, hypopigmentation.  There is no new rash or ulceration.  The skin feels thin, no thickening of the skin of the  neck.  PULMONARY:  Lungs are clear to auscultation without wheezes, rhonchi, or rales.  EXTREMITIES:  Lower extremities are much larger than the upper extremities, but I do not appreciate any pitting.  She has varicosities.  Some tenderness in the anterior lower legs.  MUSCULOSKELETAL:  No synovitis in the hands.  No synovitis in the wrists, elbows, or knees; normal narrow-based gait.  SKIN:  The fingers are not sclerodermatous.  The forearms feel somewhat mildly full, almost a woody sensation, although the skin itself is not thickened.  There are areas of erythema, atrophy, and loss of pigment today on the hands, arms, and chest.  No difficulty getting up and down from a seated position.    LABORATORY DATA:  December 2014:  WBC 4.5; hematocrit 36; platelets 226,000  ANCA negative, anti-Jo-1 negative, antimitochondrial antibody negative, antiscleroderma antibody negative  Creatinine 0.4, GFR greater than 60  albumin 4.2  K 4.1, calcium 9.4  Anticardiolipin antibody, double stranded DNA, Smith, RNP, CCP, rheumatoid factor, Sjogren's antibodies, RPR nonreactive, HIV nonreactive  WBC 4.5, hematocrit 36, platelets 226,000  C3-184  C4-44    ASSESSMENT:  [name] with dermatomyositis and areas of vitiligo.  She seems to be doing well on her current regimen.    She has chronic bladder erythema.  It is not clear to me this is related to her dermatomyositis, especially since the bladder  is smooth muscle.  However, there may be an autoimmune component.  At this point, I would not alter current regimen.    She is wearing unsupportive shoes without arches.  Given that she has lower leg discomfort, I recommend more supportive shoes with good arch supports.    I stressed the importance of the patient coming in for followup visits.      Labs today, including checking lupus labs.    I will see her back in 3 months, earlier if needed.      The patient verbalized an understanding and agreement with this  plan.    ___________________________  Reviewed and Electronically Signed By: Coolidge Breeze MD  Sig Date: 02/04/2014  Sig Time: 00:21:38  Dictated By: Coolidge Breeze MD  Dict Date: 01/23/2014 Dict Time: 11 38 AM    Dictation Date and Time:01/23/2014 11:38:49  Transcription Date and Time:01/23/2014 12:35:00  eScription Dictation id: [id number] Confirmation # :[id number]      cc: Lynnda Shields MD

## 2014-01-23 NOTE — Progress Notes (Signed)
PRIOR AUTHORIZATION FOR Clobetaol 0.05% solution    Patient has [employer] for prescription coverage.  I.D.#: [id number]    Filled out Medication Prior Authorization Request form. Scanned form into Epic under Environmental health practitioner (PA for Clobetasol solution).    Please print out form to be reviewed, signed, and faxed to insurance company.    Once the form has been signed and faxed, please scan signed form into Environmental health practitioner.

## 2014-01-23 NOTE — Progress Notes (Addendum)
Spoke with Lelan Pons, Pharmacist from [facility], who is requesting a PA fo the pt's clobetasol solution. Will notify provider.

## 2014-01-23 NOTE — Progress Notes (Signed)
Julie Freeman      Sent: Tue January 23, 2014 9:08 AM    To: Loletha Grayer     Message      ----- Message from Julie Freeman sent at 01/23/2014 9:08 AM -----     SHE ABSOLUTELY NEEDS LABS! HER NEXT APPT ISN'T UNTIL THE END OF MAY WHICH IS ALMOST 6 MONTHS! SHE HAS TO COME IN FOR LABS THIS WEEK TO GET THESE REFILLS. PLEASE LET HER KNOW. THANK YOU.    ----- Message from Loletha Grayer sent at 01/22/2014 5:10 PM -----

## 2014-01-23 NOTE — Progress Notes (Signed)
Prior authorization request for Clobetasol 0.05% solution  Clinical Indication:   Previous medications tried:   Diagnostic testing information:

## 2014-01-23 NOTE — Progress Notes (Signed)
PA copied, signed by Dr Lorane Gell and faxed to [facility].  Awaiting response.

## 2014-01-23 NOTE — Progress Notes (Signed)
Spoke with pt who agreed to come to the office for her lab sheet.  Will notify provider.

## 2014-01-25 LAB — C3 COMPLEMENT: C3 COMPLEMENT: 116 (ref 90–180)

## 2014-01-25 LAB — ANTI-DNA AB DOUBLE STRAND: ANTI-DNA AB DOUBLE STRAND: <1 IU/mL (ref 0–9)

## 2014-01-25 LAB — C4 COMPLEMENT: C4 COMPLEMENT: 24 (ref 9–36)

## 2014-01-25 NOTE — Progress Notes (Signed)
PRIOR AUTHORIZATION FOR Clobetasol 0.05% solution  Patient has [employer] for prescription coverage.   I.D.#: [id number]      Received a form requesting additional information for PA. Scanned form into Epic under Environmental health practitioner (Additional information for Clobetasol solution PA).   Please print out form to be reviewed, signed, and faxed to insurance company.   PLEASE FILL OUT PAGE 1, QUESTION 2  Once the form has been signed and faxed, please scan signed form  into Environmental health practitioner.

## 2014-01-26 LAB — CREATININE: CREATININE: 0.3 mg/dL — ABNORMAL LOW (ref 0.4–1.2)

## 2014-01-26 LAB — CREATINE KINASE TOTAL: CREATINE KINASE TOTAL: 190 IU/L (ref 26–192)

## 2014-01-26 LAB — HEPATIC FUNCTION PANEL
ALANINE AMINOTRANSFERASE: 22 U/L (ref 12–45)
ALBUMIN: 4.3 g/dL (ref 3.4–5.0)
ALKALINE PHOSPHATASE: 43 U/L — ABNORMAL LOW (ref 45–117)
ASPARTATE AMINOTRANSFERASE: 21 U/L (ref 8–34)
BILIRUBIN DIRECT: 0.2 mg/dl (ref 0.0–0.2)
BILIRUBIN TOTAL: 0.7 mg/dL (ref 0.2–1.0)
INDIRECT BILIRUBIN: 0.5 mg/dL (ref 0.2–0.9)
TOTAL PROTEIN: 8 g/dL (ref 6.4–8.2)

## 2014-01-26 LAB — VITAMIN D,25 HYDROXY: VITAMIN D,25 HYDROXY: 26 ng/mL — ABNORMAL LOW (ref 30.0–100.0)

## 2014-01-26 LAB — ESTIMATED GLOMERULAR FILT RATE: ESTIMATED GLOMERULAR FILT RATE: >60 mL/min (ref >60)

## 2014-01-26 LAB — ANTINUCLEAR ANTIBODY: ANTINUCLEAR ANTIBODY: NEGATIVE

## 2014-01-26 LAB — C-REACTIVE PROTEIN: C-REACTIVE PROTEIN: <0.3 mg/dL (ref 0.0–1.8)

## 2014-01-30 LAB — MA NON ~~LOC~~ IMAGES

## 2014-01-31 NOTE — Progress Notes (Signed)
[place] to check on the status of the Clobetasol 0.05% solution - the insurance company has a paid claim at the pharmacy on 01/29/2014    [facility] and [facility] [place] - the pharmacy was able to process the solution through the patient's insurance on 01/29/2014    A prior authorization is not needed at this time

## 2014-02-02 ENCOUNTER — Ambulatory Visit (HOSPITAL_BASED_OUTPATIENT_CLINIC_OR_DEPARTMENT_OTHER): Payer: Self-pay | Admitting: Surgery

## 2014-02-05 ENCOUNTER — Ambulatory Visit (HOSPITAL_BASED_OUTPATIENT_CLINIC_OR_DEPARTMENT_OTHER): Payer: Self-pay | Admitting: Surgery

## 2014-02-05 LAB — MA DIAGNOSTIC MAMMO UNILATERAL LEFT WITH CAD

## 2014-02-06 ENCOUNTER — Telehealth (HOSPITAL_BASED_OUTPATIENT_CLINIC_OR_DEPARTMENT_OTHER): Payer: Self-pay | Admitting: ESP Geriatrics

## 2014-02-06 NOTE — Progress Notes (Signed)
Spoke with pt who stated that she has been working with an Forensic psychologist regarding her disability. Pt stated that the Attorney suggested that her providers will need to submit a letter addressing her condition.  The letter will state that because of her illness she will not be able to work for a period of 1 year.  Pt stated that the letter can be mailed to her or faxed to her Attorney : Mr Santina Evans Ph # [phone]; Fax # [phone].  Will notify provider.

## 2014-02-06 NOTE — Telephone Encounter (Signed)
Message copied by Loletha Grayer on Tue Feb 06, 2014  2:48 PM  ------       Message from: Sharin Grave       Created: Tue Feb 06, 2014  2:18 PM       Regarding: [facility] k pt -out of work letter       Contact: [phone]         Pt is callin g would like a letter stating she can not work f or at least a year due to her disability                            Julie Freeman [id number], 38 year old, female, Telephone Information:       Home Phone      [phone]       Work Phone      Not on file.       Mobile          [phone]                     Patient's Preferred Pharmacy:               [id number] [place],  [place]       Phone: [phone] Fax: [phone]                     CONFIRMED TODAY: No              CALL BACK NUMBER: [phone]       Best time to call back: asap       Cell phone:        Other phone:              Available times:              Patient's language of care: English              Patient does not need an interpreter.              Patient's PCP: Lynnda Shields              Person calling on behalf of patient: Patient (self)              Calls today to speak to nurse only.       to speak to provider only.       requesting a letter for: Out of work/school                       ------

## 2014-02-07 NOTE — Progress Notes (Signed)
Attempted to reach patient.  Left message.  Will notify provider.

## 2014-02-07 NOTE — Progress Notes (Signed)
Where is this one year coming from?    She was doing well when I last saw her.  I prefer filling out the form that details her exam and disease findings over a blank statement letter.  Please have her attorneys reach out to me to fill out the appropriate medical paperwork.    Please find out what symptoms are preventing her from working and what her job duties are that she is unable to do.

## 2014-02-20 NOTE — Progress Notes (Signed)
Spoke with pt who stated that she received a disability packet that she has to fill out and mail it out. Pt stated that she will have to wait for a decision before proceeding with any paper work that would be needed from her physicians. Will notify provider.

## 2014-02-21 ENCOUNTER — Encounter (HOSPITAL_BASED_OUTPATIENT_CLINIC_OR_DEPARTMENT_OTHER): Payer: Self-pay | Admitting: Urology

## 2014-02-23 ENCOUNTER — Ambulatory Visit (HOSPITAL_BASED_OUTPATIENT_CLINIC_OR_DEPARTMENT_OTHER): Payer: Self-pay | Admitting: Podiatrist

## 2014-02-27 ENCOUNTER — Encounter (HOSPITAL_BASED_OUTPATIENT_CLINIC_OR_DEPARTMENT_OTHER): Payer: Self-pay | Admitting: Internal Medicine

## 2014-02-27 ENCOUNTER — Ambulatory Visit (HOSPITAL_BASED_OUTPATIENT_CLINIC_OR_DEPARTMENT_OTHER): Payer: PRIVATE HEALTH INSURANCE | Admitting: Nurse Practitioner

## 2014-03-01 ENCOUNTER — Encounter (HOSPITAL_BASED_OUTPATIENT_CLINIC_OR_DEPARTMENT_OTHER): Payer: Self-pay | Admitting: Internal Medicine

## 2014-03-01 ENCOUNTER — Ambulatory Visit (HOSPITAL_BASED_OUTPATIENT_CLINIC_OR_DEPARTMENT_OTHER): Payer: PRIVATE HEALTH INSURANCE | Admitting: Internal Medicine

## 2014-03-01 ENCOUNTER — Telehealth (HOSPITAL_BASED_OUTPATIENT_CLINIC_OR_DEPARTMENT_OTHER): Payer: Self-pay | Admitting: Dermatology

## 2014-03-01 VITALS — BP 132/80 | Wt 138.0 lb

## 2014-03-01 DIAGNOSIS — S30814A Abrasion of vagina and vulva, initial encounter: Secondary | ICD-10-CM

## 2014-03-01 NOTE — Progress Notes (Signed)
Thank you for referring your patient to me with use of the teledermatology system. I am basing my recommendations upon the information provided by the referring physician, including images, and upon review of the problem list, medications, and allergies as documented in the electronic medical record. I have not had the benefit of personally interviewing or physically examining the patient.    Consultation request:  patient well known to Dr       Corey Harold-- several months of intermittent"cut" on labia -- No      discharge or definite vesicles seen          Image interpretation:  There are two close-up photos provided of the vaginal introitus. There are poorly visualized, poorly focused erosions noted on the R labial majora, approx 3-21mm in size    Assessment:   38 yo F w/ complicated past medical history including dermatomyositis, presents to PCP for evaluation of erosions in the R labia majora. No clinical information is provided regarding duration of these lesions, associated symptoms, clinical history of trauma, contacts, or other clinical information which may be helpful in narrowing the differential diagnosis. The differential diagnosis for vaginal erosions is very broad and includes HSV, lichen planus, lichen sclerosus, and several other etiologies.   Recommendations:   - In the future, please provide additional clinical history which may be helpful in narrowing the differential diagnosis  - If these lesions are recurrent, and there is no history of trauma, the most common cause of recurrent vaginal erosions in the same location is likely HSV. Providing history of prodrome or associated symptoms would be useful  - If there is any suspicion for mucocutaneous HSV (as there is in this case) in office tests of HSV culture, DFA, or Tzanck smears can be performed to confirm the suspected diagnosis. In the future, please perform at least one of these tests if there is suspicion for HSV or VZV and there  are lesions present that may be amenable to scraping  - Pt is scheduled to see me in clinic next week. I will f/u with her then and re-evaluate these lesions at that time. Will obtain further history and will perform HSV culture/DFA if there are still suitable lesions present at that time  - IF your suspicion is high enough for HSV can treat empirically with valtrex, although appropriate testing to confirm diagnosis and rule out other etiologies is generally advised     Please let me know whether I can be of further assistance and please let me know how the patient responds to your management.  Sincerely yours,

## 2014-03-01 NOTE — Progress Notes (Signed)
Julie Freeman is a 38 year old female    "cut" labia on external labia majora    Patient with dermatomyositis/ ? Scleroderma  No recent intercourse.    well developed, well nourished  BP 132/80   Wt 138 lb (62.596 kg)    0.5cm linear lesion on labia  See Media image for telederm    (911.0) Labial abrasion, initial encounter  (primary encounter diagnosis)  Comment: unclear etiology-- not typical for HSV  Plan: REFERRAL TO TELEDERMATOLOGY        Keep appt with Dr Corey Harold--? Cultures at that time

## 2014-03-06 ENCOUNTER — Encounter (HOSPITAL_BASED_OUTPATIENT_CLINIC_OR_DEPARTMENT_OTHER): Payer: Self-pay | Admitting: Internal Medicine

## 2014-03-06 ENCOUNTER — Ambulatory Visit (HOSPITAL_BASED_OUTPATIENT_CLINIC_OR_DEPARTMENT_OTHER): Payer: Self-pay | Admitting: Urology

## 2014-03-06 MED ORDER — VITAMIN D 1000 UNITS PO CAPS
1.0000 | ORAL_CAPSULE | Freq: Every day | ORAL | Status: DC
Start: 2014-03-06 — End: 2014-09-04

## 2014-03-07 ENCOUNTER — Ambulatory Visit (HOSPITAL_BASED_OUTPATIENT_CLINIC_OR_DEPARTMENT_OTHER): Payer: Self-pay | Admitting: Internal Medicine

## 2014-03-08 ENCOUNTER — Ambulatory Visit (HOSPITAL_BASED_OUTPATIENT_CLINIC_OR_DEPARTMENT_OTHER): Payer: PRIVATE HEALTH INSURANCE | Admitting: Dermatology

## 2014-03-08 DIAGNOSIS — M3390 Dermatopolymyositis, unspecified, organ involvement unspecified: Secondary | ICD-10-CM

## 2014-03-08 DIAGNOSIS — M3313 Other dermatomyositis without myopathy: Secondary | ICD-10-CM

## 2014-03-08 NOTE — Progress Notes (Signed)
Pt is safe at home

## 2014-03-08 NOTE — Progress Notes (Signed)
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    Pt was last seen in clinic approximately 4 months ago for the same.    Pt today with a lesion of concern in the vagnia. She is not sure how long it has been there. She reports that these lesions have been happening on and off for several months. The lesions always are on the R side. Lesions are painful until they heal. No tingling or itching. Pt has been sexually active primarily with women. She has not noted any contactants or triggers to this area. Previously, a teledermatology consultation was filled out for this, but the photographs were inconclusive. HSV was considered in the ddx.    These lesions are the patient's primary concern today. She is not sure if they are still present and believes that they may have resolved.     Regarding her dermatomyositis, she believes that things are "stable, about the same". She notes that she did have some areas of pink after spending more time in the sun recently. She has not been vigilant about application of sunscreen. She has no other dermatologic complaints or concerns today.    She has no new skin related complaints or concerns. She denies fevers, chills, night sweats, nausea, vomiting, weight loss headaches, cough, shortness of breath.     ROS No recent fevers, chills, night sweats, weight loss, nausea, vomiting, diarrhea, headaches, cough, shortness of breath, arthralgias, myalgias        Past Medical History    Dermatomyositis     Shingles      .  Current outpatient prescriptions:cholecalciferol (VITAMIN D) 1000 UNITS capsule, Take 1 capsule by mouth daily. Take with 2000 unit capsule for a daily dose of 3000 units, Disp: 30 capsule, Rfl: 11;  clobetasol (TEMOVATE) 0.05 % external solution, APPLY TOPICALLY TWO TIMES DAILY, Disp: 50 mL, Rfl: 3;  folic acid (FOLVITE) 1 MG tablet, Take 3 tablets by mouth daily. Dose increase, Disp: 90 tablet, Rfl: 5  Cholecalciferol (VITAMIN D) 2000 UNITS CAPS, Take  1 capsule by mouth daily., Disp: 30 capsule, Rfl: 5;  methotrexate 2.5 MG tablet, TAKE 10 TABLETS BY MOUTH ONCE A WEEK- IN DIVIDED DOSING, Disp: 40 tablet, Rfl: 3;  mycophenolate (CELLCEPT) 500 MG tablet, TAKE 3 TABLETS BY MOUTH 2 (TWO) TIMES DAILY., Disp: 180 tablet, Rfl: 3;  hydroxychloroquine (PLAQUENIL) 200 MG tablet, TAKE 2 TABLETS BY MOUTH EVERY DAY, Disp: 60 tablet, Rfl: 2  levonorgestrel (MIRENA) 20 MCG/24HR IUD, 1 Device by Intrauterine route continuous. As Instructed, Disp: 1 Device, Rfl: 0;  pentosan polysulfate (ELMIRON) 100 MG capsule, Take 1 capsule by mouth 3 (three) times daily before meals., Disp: 90 capsule, Rfl: 11;  Ammonium Lactate 10 % CREA, Apply 15 mLs Topically 2 (two) times daily as needed., Disp: 1 Bottle, Rfl: 11  mupirocin (BACTROBAN) 2 % ointment, APPLY TOPICALLY THREE TIMES DAILY, Disp: 22 g, Rfl: 11;  Calcium Carbonate-Vitamin D 600-400 MG-UNIT TABS Tablet, TAKE ONE (1) TABLET TWICE DAILY.  Do not take within a few hours of mycophenolate., Disp: 60 tablet, Rfl: 11;  multivitamin (THERAGRAN) per tablet, TAKE ONE (1) TABLET DAILY, Disp: 30 tablet, Rfl: 11    Review of Patient's Allergies indicates:   Cat dander              Runny Nose      Vitals:  There were no vitals taken for this visit.    Physical Exam  A focused dermatologic exam  of the face, hands, scalp, chest, back, and upper extremities, as well as the pelvis was performed today:  -AOx3   - Most prominently on the periorbital skin but also on the lateral aspects of the cheek there are dyspigmented patches with intermixed hyper, hyopigmentation and telangiectasias   - On the chest, in a V distribution, as well as on the posterior neck, there are dyspigmented patches with intermixed hyper/hypopigmentation and telangiectasias   - There is a background of blanchable pink patches overlying these changes on the forehead and proximal upper extremities  - The face is somewhat cachetic appearing   - There is normal oral aperture   -  there is no heliotrope sign   - Bilateral upper extremities are very thin with apparent loss of the subcutaneous fat. 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 Recently also with recurrent vaginal erosions.  For now I recommended:   .  For vaginal erosions  - These lesions have resolved at the time of evaluation today  - History is suggestive of HSV. If lesions recur, advised her to call so that appropriate testing (DFA, herpes culture) could be performed to confirm/refute diagnosis  .  Regarding dermatomyositis  - Emphasized importance of application of SPF 30 or greater sunscreen regularly during sun exposure  -She is currently on plaquenil, methotrexate, cellcept, and prednisone, with significant subjective improvement in muscular function but persistent cutaneous symptoms   - Labs reviewed from last visit  .  RTC 1-2 months or earlier PRN flare

## 2014-03-14 ENCOUNTER — Ambulatory Visit (HOSPITAL_BASED_OUTPATIENT_CLINIC_OR_DEPARTMENT_OTHER): Payer: PRIVATE HEALTH INSURANCE | Admitting: Internal Medicine

## 2014-03-14 ENCOUNTER — Encounter (HOSPITAL_BASED_OUTPATIENT_CLINIC_OR_DEPARTMENT_OTHER): Payer: Self-pay | Admitting: Internal Medicine

## 2014-03-14 VITALS — BP 116/80 | Ht 68.0 in | Wt 134.0 lb

## 2014-03-14 DIAGNOSIS — Z0000 Encounter for general adult medical examination without abnormal findings: Secondary | ICD-10-CM

## 2014-03-14 NOTE — Progress Notes (Signed)
Julie Freeman is a 38 year old female here for HME    Social : recently broke up with GF, feeling more anxious and stressed than normal.  Has reconnected with former therapist in [place].      #Dematomyositis - followed by derm and rheum - has been stable on medications    #Urinary frequency - followed by urology, stable      Past Medical History    Dermatomyositis     Shingles          Past Surgical History    BREAST LUMPECTOMY         Current Outpatient Prescriptions on File Prior to Visit:  cholecalciferol (VITAMIN D) 1000 UNITS capsule Take 1 capsule by mouth daily. Take with 2000 unit capsule for a daily dose of 3000 units Disp: 30 capsule Rfl: 11   clobetasol (TEMOVATE) 0.05 % external solution APPLY TOPICALLY TWO TIMES DAILY Disp: 50 mL Rfl: 3   folic acid (FOLVITE) 1 MG tablet Take 3 tablets by mouth daily. Dose increase Disp: 90 tablet Rfl: 5   Cholecalciferol (VITAMIN D) 2000 UNITS CAPS Take 1 capsule by mouth daily. Disp: 30 capsule Rfl: 5   methotrexate 2.5 MG tablet TAKE 10 TABLETS BY MOUTH ONCE A WEEK- IN DIVIDED DOSING Disp: 40 tablet Rfl: 3   mycophenolate (CELLCEPT) 500 MG tablet TAKE 3 TABLETS BY MOUTH 2 (TWO) TIMES DAILY. Disp: 180 tablet Rfl: 3   hydroxychloroquine (PLAQUENIL) 200 MG tablet TAKE 2 TABLETS BY MOUTH EVERY DAY Disp: 60 tablet Rfl: 2   levonorgestrel (MIRENA) 20 MCG/24HR IUD 1 Device by Intrauterine route continuous. As Instructed Disp: 1 Device Rfl: 0   pentosan polysulfate (ELMIRON) 100 MG capsule Take 1 capsule by mouth 3 (three) times daily before meals. Disp: 90 capsule Rfl: 11   Ammonium Lactate 10 % CREA Apply 15 mLs Topically 2 (two) times daily as needed. Disp: 1 Bottle Rfl: 11   mupirocin (BACTROBAN) 2 % ointment APPLY TOPICALLY THREE TIMES DAILY Disp: 22 g Rfl: 11   Calcium Carbonate-Vitamin D 600-400 MG-UNIT TABS Tablet TAKE ONE (1) TABLET TWICE DAILY.  Do not take within a few hours of mycophenolate. Disp: 60 tablet Rfl: 11     No current facility-administered  medications on file prior to visit.      Family History    No Known Family History Mother     Diabetes Father           Review of Systems   Constitutional: Negative for fever and chills.   HENT: Negative for congestion and ear pain.    Eyes: Negative for blurred vision and double vision.   Respiratory: Negative for cough, shortness of breath and wheezing.    Cardiovascular: Positive for leg swelling. Negative for chest pain.        LE chronically larger than upper extremities, no recent change     Gastrointestinal: Negative for heartburn, abdominal pain, diarrhea and constipation.   Genitourinary: Positive for urgency. Negative for dysuria.        Ongoing   Musculoskeletal: Negative for joint pain.   Skin: Positive for rash.        From dematomyositis - with hypopigmentation and some open erythematous macules on hands   Neurological: Negative for dizziness and headaches.   Psychiatric/Behavioral: Negative for depression and substance abuse.   BP 116/80   Ht 5\' 8"  (1.727 m)   Wt 134 lb (60.782 kg)   BMI 20.38 kg/m2   LMP 03/04/2014 (Exact Date)  Physical Exam   Constitutional:   Thin cachetic appearing upper body with larger LE   HENT:   Head: Normocephalic and atraumatic.   Right Ear: External ear normal.   Left Ear: External ear normal.   Mouth/Throat: Oropharynx is clear and moist. No oropharyngeal exudate.   Eyes: Conjunctivae and EOM are normal. No scleral icterus.   Neck: Neck supple. No thyromegaly present.   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  Up to date with HM  Plan: f.u with derm, urology, rheum as planned.    RTC prn with new PCP Dr. Christa See

## 2014-03-15 NOTE — Progress Notes (Signed)
PRECEPTOR NOTE  On the day of the patient's visit, I personally saw and evaluated the patient. In addition, I reviewed findings with the resident. I confirm the key elements of history and physical exam as described in resident's note.  I agree with the assessment and plan as described below.  Please see resident's note for further details.

## 2014-03-17 ENCOUNTER — Other Ambulatory Visit (HOSPITAL_BASED_OUTPATIENT_CLINIC_OR_DEPARTMENT_OTHER): Payer: Self-pay | Admitting: Internal Medicine

## 2014-03-20 ENCOUNTER — Encounter (HOSPITAL_BASED_OUTPATIENT_CLINIC_OR_DEPARTMENT_OTHER): Payer: Self-pay | Admitting: Dermatology

## 2014-03-21 ENCOUNTER — Ambulatory Visit (HOSPITAL_BASED_OUTPATIENT_CLINIC_OR_DEPARTMENT_OTHER): Payer: PRIVATE HEALTH INSURANCE | Admitting: Dermatology

## 2014-03-21 DIAGNOSIS — N9089 Other specified noninflammatory disorders of vulva and perineum: Secondary | ICD-10-CM

## 2014-03-21 NOTE — Progress Notes (Signed)
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 Last seen by Dr.Stavert 03/08/14 at which time she reported a several month history of tender intermittent lesions on her right vagina. They usually last for a few days. Current lesion started 1-2 days ago. It burns when she urinates. Not putting anything on it, otherwise feeling well. No new meds.     Focused exam: Examined vagina, perineum, inguinal folds, buttocks, thighs, lower extremities, face, hands:    - on the right labia minora are two very fine 1-1.53mm erosions--no pustules, vesicles, ulcers, erythema, atrophy or other papules    A/P: 39 year old female with complicated medical history including dermatomyositis, overlap AICTD presents for urgent eval of tender lesions on the right labia/ Unclear what these represent as today they only look like fine erosions. I favor that these are actually traumatic (her fingernails are quite long). They do not look like herpes to me nor does her history fit with a herpetic infection.    1. Vaginal erosions  - does not look like herpes to me  - ? Trauma  vs. apthae   - viral culture taken today    Return for follow up with Dr.Stavert as scheduled

## 2014-03-27 ENCOUNTER — Ambulatory Visit (HOSPITAL_BASED_OUTPATIENT_CLINIC_OR_DEPARTMENT_OTHER): Payer: PRIVATE HEALTH INSURANCE | Admitting: Urology

## 2014-03-27 ENCOUNTER — Encounter (HOSPITAL_BASED_OUTPATIENT_CLINIC_OR_DEPARTMENT_OTHER): Payer: Self-pay | Admitting: Urology

## 2014-03-27 DIAGNOSIS — R3915 Urgency of urination: Secondary | ICD-10-CM

## 2014-03-27 DIAGNOSIS — N83201 Unspecified ovarian cyst, right side: Secondary | ICD-10-CM

## 2014-03-27 DIAGNOSIS — N83202 Unspecified ovarian cyst, left side: Secondary | ICD-10-CM

## 2014-03-27 LAB — URINALYSIS
BILIRUBIN, URINE: NEGATIVE
GLUCOSE, URINE: NEGATIVE MG/DL
KETONE, URINE: NEGATIVE MG/DL
LEUKOCYTE ESTERASE: NEGATIVE
NITRITE, URINE: NEGATIVE
OCCULT BLOOD, URINE: NEGATIVE
PH URINE: 6 (ref 5.0–8.0)
SPECIFIC GRAVITY URINE: 1.03 (ref 1.003–1.035)

## 2014-03-27 LAB — URINE DIP (POINT OF CARE)
BILIRUBIN, URINE: NEGATIVE
GLUCOSE, URINE: NEGATIVE mg/dl
LEUKOCYTE ESTERASE: NEGATIVE
NITRITE, URINE: NEGATIVE
OCCULT BLOOD, URINE: NEGATIVE
PH URINE: 6 (ref 5.0–8.0)
PROTEIN, URINE: 30 mg/dl — AB (ref 0–15)
SPECIFIC GRAVITY URINE: >1.03 (ref 1.003–1.030)
UROBILINOGEN URINE: 0.2 mg/dl (ref 0.2–1.0)

## 2014-03-27 NOTE — Progress Notes (Signed)
CHIEF COMPLAINT:  This is a 38 year old female with a history of dermatomyositis versus scleroderma, seen in f/u for chronic urinary urgency and frequency.  Bx of bladder shows only chronic inflammation.    The patient has done well since last visit.  There has been no hospitalizations or new surgeries.    Remains on plaquenil, methotrexate, and cellcept.     Urinary Sxs:  She remains on Elmiron - she has seen improvement  Has noticed an improvement in her ugency.  No GH, no dysuria.   Also notices sxs are worse with stress.    DIET:   Minimal coffee, does love spicy food.    ENTERED: 09/18/13-1102  RANDOM BLADDER, BIOPSY:  - MILD CHRONIC INFLAMMATORY CHANGES ONLY.  - THE APPEARANCES ARE NON-DIAGNOSTIC.    HISTORY OF ILLNESS AT INITIAL PRESENTATION TO OUR OFFICE:   :  Julie Freeman is a pleasant 38 year old female who has a very severe case of dermatomyositis versus scleroderma, for which she is currently on Plaquenil and methotrexate.  She has been on these meds for approximately 8 years.  She has also been on CellCept and prednisone in the past.  All of her symptoms started at the age of 56, right before her pregnancy.  She developed muscle weakness in both her upper and lower extremities.    With regards to her urination, these symptoms started about the same time.  Her main complaint is frequency, both daytime and nighttime.  She urinates every 1-2 hours during the day and 1-2 times per night.  She actually has a voiding diary, which she brought with her today, and she urinates anywhere from 1-6 ounces every 1-3 hours during the day.  She denies any history of gross hematuria or dysuria.  She has no obstructive symptoms.  Should mention that she does have oral ulcers associated with her systemic disorder.    The patient has a history of 1 vaginal delivery.  She is premenopausal.  She uses an IUD for birth control.  She is sexually active but denies any dyspareunia or vaginal dryness.  She does not have a  history of bladder infections.  Urinalysis in September 2014 was negative, and urine culture in June 2014 showed only less than 10K mixed.    ROS:  Constitutional: Patient denies any unexpected weight change, chronic fatigue, fevers or chills.  Respiratory: Patient denies any shortness of breath, wheezing, or new unexplained cough.  Cardiovascular: Patient denies any chest pains, chest palpitations, or chest tightness.  GI: Patient denies abdominal pain and has a good appetite, no nausea/vomiting, and normal bowel function without hematochezia.  GU: As per HPI.  MS: Patient denies any flank pain, back pain, myalgias, or recent skeletal fractures.  Neurologic: Patient denies any new recent headaches or change in vision.  No unexplained dizziness, muscle weakness, or paresthesia.  Psychiatry: Patient denies any new confusion, depression or anxiety.  Heme: no unexplained adenopathy, bruising, or bleeding.  Skin: chronic dermatitis.    Current Medications, Allergies, PMH, and PSH were all reviewed and updated in EPIC.    PE:  Alert, oriented, NAD  Back: No spinal or costovertebral tenderness to percussion.  Abdomen: Soft, nontender, nondistended, with no organomegaly or palpable masses.    Pelvis: Bladder not palpable.  Extemities: Lower extremities without edema and well perfused.  DERM: difficuse sclertotic changes, +vitiligo  Genitourinary (07/2013):  Labia majora, minora, and vulva are without lesion or erythema.  Bartholin, Skene, and periurethral glands are without mass, cyst, or tenderness.  She exhibits no cystocele.    ASSESSMENT AND PLAN:   This is a 38 year old female with a history of dermatomyositis versus scleroderma, seen in f/u for chronic urinary urgency and frequency.  We discussed that likely the immune process affecting her epidermis is also likely affecting her bladder mucosa.    Her sxs are improved on Elmiron, and I would like her to continue this med for now.   Continue diet low on bladder  irritatants:   including acidic foods and spicy foods.    Does have hx of ovarian cysts (when large, can irritate bladder)  F/u 5-6 mo with pelvic US    ELmiron: Elmiron was evaluated in two clinical trials for the relief of pain in patients with chronic interstitial cystitis (IC). All patients met the NIH definition of IC based upon the results of cystoscopy, cytology, and biopsy. One blinded, randomized, placebo-controlled study evaluated 151 patients (145 women, 5 men, 1 unknown) with a mean age of 21 years (range 18 to 45). Approximately equal numbers of patients received either placebo or Elmiron 100 mg three times a day for 3 months. Clinical improvement in bladder pain was based upon the patient's own assessment. In this study, 28/74 (38%) of patients who received Elmiron and 13/74 (18%) of patients who received placebo showed greater than 50% improvement in bladder pain (p = 0.005).

## 2014-03-28 LAB — URINE CULTURE: URINE CULTURE/COLONY COUNT: NO GROWTH

## 2014-03-30 LAB — VIRAL CULTURE

## 2014-04-13 ENCOUNTER — Other Ambulatory Visit (HOSPITAL_BASED_OUTPATIENT_CLINIC_OR_DEPARTMENT_OTHER): Payer: Self-pay | Admitting: Internal Medicine

## 2014-04-16 ENCOUNTER — Ambulatory Visit (HOSPITAL_BASED_OUTPATIENT_CLINIC_OR_DEPARTMENT_OTHER): Payer: PRIVATE HEALTH INSURANCE | Admitting: Dermatology

## 2014-04-16 ENCOUNTER — Encounter (HOSPITAL_BASED_OUTPATIENT_CLINIC_OR_DEPARTMENT_OTHER): Payer: Self-pay | Admitting: Dermatology

## 2014-04-19 ENCOUNTER — Encounter (HOSPITAL_BASED_OUTPATIENT_CLINIC_OR_DEPARTMENT_OTHER): Payer: Self-pay | Admitting: Internal Medicine

## 2014-04-24 ENCOUNTER — Ambulatory Visit (HOSPITAL_BASED_OUTPATIENT_CLINIC_OR_DEPARTMENT_OTHER): Payer: Self-pay | Admitting: Internal Medicine

## 2014-04-26 ENCOUNTER — Ambulatory Visit (HOSPITAL_BASED_OUTPATIENT_CLINIC_OR_DEPARTMENT_OTHER): Payer: Self-pay | Admitting: Internal Medicine

## 2014-05-03 ENCOUNTER — Ambulatory Visit (HOSPITAL_BASED_OUTPATIENT_CLINIC_OR_DEPARTMENT_OTHER): Payer: Self-pay | Admitting: Internal Medicine

## 2014-05-04 ENCOUNTER — Ambulatory Visit (HOSPITAL_BASED_OUTPATIENT_CLINIC_OR_DEPARTMENT_OTHER): Payer: PRIVATE HEALTH INSURANCE

## 2014-05-04 DIAGNOSIS — Z79899 Other long term (current) drug therapy: Secondary | ICD-10-CM

## 2014-05-12 ENCOUNTER — Other Ambulatory Visit (HOSPITAL_BASED_OUTPATIENT_CLINIC_OR_DEPARTMENT_OTHER): Payer: Self-pay | Admitting: Internal Medicine

## 2014-05-17 ENCOUNTER — Ambulatory Visit (HOSPITAL_BASED_OUTPATIENT_CLINIC_OR_DEPARTMENT_OTHER): Payer: PRIVATE HEALTH INSURANCE | Admitting: Internal Medicine

## 2014-05-17 VITALS — BP 130/84 | HR 64 | Ht 68.0 in | Wt 138.0 lb

## 2014-05-17 DIAGNOSIS — M3313 Other dermatomyositis without myopathy: Secondary | ICD-10-CM

## 2014-05-17 DIAGNOSIS — M3390 Dermatopolymyositis, unspecified, organ involvement unspecified: Secondary | ICD-10-CM

## 2014-05-17 DIAGNOSIS — Z5181 Encounter for therapeutic drug level monitoring: Secondary | ICD-10-CM

## 2014-05-17 LAB — HEPATIC FUNCTION PANEL
ALANINE AMINOTRANSFERASE: 24 U/L (ref 12–45)
ALBUMIN: 3.9 g/dL (ref 3.4–5.0)
ALKALINE PHOSPHATASE: 34 U/L — ABNORMAL LOW (ref 45–117)
ASPARTATE AMINOTRANSFERASE: 27 U/L (ref 8–34)
BILIRUBIN DIRECT: 0.2 mg/dl (ref 0.0–0.2)
BILIRUBIN TOTAL: 0.6 mg/dL (ref 0.2–1.0)
INDIRECT BILIRUBIN: 0.4 mg/dL (ref 0.2–0.9)
TOTAL PROTEIN: 7.4 g/dL (ref 6.4–8.2)

## 2014-05-17 LAB — RBC SEDIMENTATION RATE: RBC SEDIMENTATION RATE: 13 MM/HR (ref 0–15)

## 2014-05-17 LAB — CBC WITH PLATELET
HEMATOCRIT: 34.9 % (ref 34.1–44.9)
HEMOGLOBIN: 11.6 g/dL (ref 11.2–15.7)
MEAN CORP HGB CONC: 33.2 g/dL (ref 31.0–37.0)
MEAN CORPUSCULAR HGB: 33 pg (ref 26.0–34.0)
MEAN CORPUSCULAR VOL: 99.4 fL (ref 80.0–100.0)
MEAN PLATELET VOLUME: 10 fL (ref 8.7–12.5)
PLATELET COUNT: 215 10*3/uL (ref 150–400)
RBC DISTRIBUTION WIDTH STD DEV: 45.3 fL (ref 35.1–46.3)
RBC DISTRIBUTION WIDTH: 12.5 % (ref 11.5–14.3)
RED BLOOD CELL COUNT: 3.51 M/uL — ABNORMAL LOW (ref 3.90–5.20)
WHITE BLOOD CELL COUNT: 4.2 10*3/uL (ref 4.0–11.0)

## 2014-05-17 LAB — CREATININE: CREATININE: 0.3 mg/dL — ABNORMAL LOW (ref 0.4–1.2)

## 2014-05-17 LAB — BUN (UREA NITROGEN): BUN (UREA NITROGEN): 15 mg/dL (ref 7–18)

## 2014-05-17 LAB — CREATINE KINASE TOTAL: CREATINE KINASE TOTAL: 150 IU/L (ref 26–192)

## 2014-05-17 LAB — ESTIMATED GLOMERULAR FILT RATE: ESTIMATED GLOMERULAR FILT RATE: >60 mL/min (ref >60)

## 2014-05-17 LAB — C-REACTIVE PROTEIN: C-REACTIVE PROTEIN: <0.3 mg/dL (ref 0.0–1.8)

## 2014-05-17 MED ORDER — METHOTREXATE 2.5 MG PO TABS
ORAL_TABLET | ORAL | Status: DC
Start: 2014-05-17 — End: 2014-09-04

## 2014-05-17 MED ORDER — MYCOPHENOLATE MOFETIL 500 MG PO TABS
ORAL_TABLET | ORAL | Status: DC
Start: 2014-05-17 — End: 2014-08-17

## 2014-05-17 NOTE — Progress Notes (Signed)
Date of Service: 05/17/2014    HISTORY OF PRESENT ILLNESS:  This is a follow-up visit for this 38 year old woman with dermatomyositis.    She takes mycophenolate 1500 mg twice a day, methotrexate 25 mg weekly, and 400 mg of hydroxychloroquine.      She denies pain, swelling, and stiffness.  She is not taking prednisone.  She is wearing sunscreen.  She feels well overall.    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    PAST MEDICAL HISTORY:  1.  Obstructive pattern on PFTs  2.  Shingles of the face complicated by cellulitis  3.  Breast lump related to dermatomyositis  4.  Proteinuria  5.  Seasonal allergies  6.  Anemia  7.  Erythema nodosum  8.  Focal bladder or erythema    ALLERGIES AND ADVERSE REACTIONS:  No known drug allergies.    MEDICATIONS:  Ammonium lactate, calcium, vitamin D, folic acid, hydroxychloroquine, Mirena IUD, methotrexate, mupirocin, mycophenolate, pentosan.    SOCIAL HISTORY:  No alcohol, no tobacco.    PHYSICAL EXAMINATION:  VITAL SIGNS:  Height 5 feet 8 inches.  Weight is 138 pounds, BMI 21, blood pressure 130/84, pulse 64, SpO2 of 99%, pain 2/10.  GENERAL:  She is in no acute distress.  She is alert and oriented.  Good eye contact.  Full affect.  HEENT:  No malar rash.  Oropharynx clear.  NECK:  Supple.  LUNGS:  Clear.  EXTREMITIES:  No edema.  SKIN:  Areas of erythema, vitiligo, and atrophy.    LABORATORY DATA:  Creatinine 0.3, GFR greater than 60  AST 21, ALT 22, CRP less than 0.3  Vitamin D 26  CK 190  C3 of 116, C4 of 24    ASSESSMENT:  [name] with dermatomyositis.    Plan:  The patient has been lost to followup.  Stressed the importance of her coming in 31-month intervals.    She has been on her current regimen for many years.  I recommend decreasing mycophenolate to 2500 mg daily.  I will see her back in 3 months.  At that time we will consider lowering the mycophenolate to 2000 mg daily.    She will stay on methotrexate  and hydroxychloroquine.    The patient wanted to review potential adverse reactions to methotrexate and hydroxychloroquine and mycophenolate, which we did today in depth.    Labs today.    After the patient left she spoke to the front desk about only wanting to come in 38-month intervals.  I stressed the importance of her coming in earlier to have her labs done.    ___________________________  Reviewed and Electronically Signed By: Coolidge Breeze MD  Sig Date: 10/16/2014  Sig Time: 13:24:42  Dictated By: Coolidge Breeze MD  Dict Date: 05/17/2014 Dict Time: 03 49 PM    Dictation Date and Time:05/17/2014 15:49:48  Transcription Date and Time:05/17/2014 16:46:26  eScription Dictation id: [id number] Confirmation # :[id number]      cc: Royal Hawthorn MD

## 2014-05-17 NOTE — Progress Notes (Signed)
The primary progress note for this visit has been dictated through E-Scription. It can be viewed as an attachment to this encounter or through Chart Review under the NOTE Tab as an Rheumatology Office Note.      Review of Systems: Constitutional, Eyes, ENT/Mouth, Cardiovascular, Respiratory, GI, GU, Neuro, Psych, Heme/Lymph, Skin, Musculoskeletal, and Endocrine systems were reviewed and are NEGATIVE, except for what is dictated in the note.      >50% of a 40 minute visit was spent counseling, educating, reviewing outside medical records, and coordinating medical care, outside of the time needed to perform any procedures.

## 2014-05-24 ENCOUNTER — Ambulatory Visit (HOSPITAL_BASED_OUTPATIENT_CLINIC_OR_DEPARTMENT_OTHER): Payer: PRIVATE HEALTH INSURANCE | Admitting: Dermatology

## 2014-05-24 DIAGNOSIS — M3313 Other dermatomyositis without myopathy: Secondary | ICD-10-CM

## 2014-05-24 DIAGNOSIS — M3390 Dermatopolymyositis, unspecified, organ involvement unspecified: Secondary | ICD-10-CM

## 2014-05-24 NOTE — Progress Notes (Signed)
Pt is safe at home

## 2014-05-24 NOTE — Progress Notes (Signed)
Julie Freeman is a very pleasant 38 year old female who presents to the dermatology clinic today for follow-up.    Last derm visit date: 03/08/14  Last derm visit diagnoses/concerns: dermatomyositis, vaginal abrasion  Last derm visit treatments:    These lesions have resolved at the time of evaluation today  - History is suggestive of HSV. If lesions recur, advised her to call so that appropriate testing (DFA, herpes culture) could be performed to confirm/refute diagnosis  .  Regarding dermatomyositis  - Emphasized importance of application of SPF 30 or greater sunscreen regularly during sun exposure  -She is currently on plaquenil, methotrexate, cellcept, and prednisone, with significant subjective improvement in muscular function but persistent cutaneous symptoms   - Labs reviewed from last visit    Interim history:    Pt is pleasant 38 yo F who presents today for f/u of dermatomyositis.    Since last visit, Pt was seen by Dr.Reddy for an cute visit to evaluate a recurrent erosion in the vagina. Dr.Reddy performed a DFA which was negative and favored that this was a traumatic erosion. Pt reports that she has not had any recurrence of these lesions since that time.     Pt notes that she has pink rashes on her hands, upper chest, and face. She has attempted to be compliant about sunscreen use. She denies any rashes in non-sun exposed areas. She continues to take cellcept 1500 mg twice a day, methotrexate 25 mg weekly, and 400 mg of hydroxychloroquine. She denies pain, swelling, and stiffness. She is not taking prednisone. She is wearing sunscreen. She feels well overall.  She reports increased energy and improvement in her mood recently. She has no new dermatologic complaints or concerns.    Other dermatologic history: dermatomyositis    Other interim changes in patient's health: Pt denies    ROS: No recent fevers, chills, night sweats, weight loss, nausea, vomiting, diarrhea, headaches, cough, shortness of breath,  arthralgias, myalgias        Past Medical History    Dermatomyositis     Shingles        Current outpatient prescriptions:mycophenolate (CELLCEPT) 500 MG tablet, 5 tabs po daily in divided dosing, Disp: 150 tablet, Rfl: 3;  methotrexate 2.5 MG tablet, TAKE 10 TABLETS BY MOUTH ONCE A WEEK- IN DIVIDED DOSING, Disp: 40 tablet, Rfl: 3;  Calcium Carbonate-Vitamin D 600-400 MG-UNIT TABS Tablet, TAKE ONE (1) TABLET TWICE DAILY. DO NOT TAKE WITHIN A FEW HOURS OF MYCOPHENOLATE., Disp: 60 tablet, Rfl: 0  folic acid (FOLVITE) 1 MG tablet, TAKE 2 TABLETS BY MOUTH DAILY, Disp: 60 tablet, Rfl: 0;  hydroxychloroquine (PLAQUENIL) 200 MG tablet, TAKE 2 TABLETS BY MOUTH EVERY DAY, Disp: 60 tablet, Rfl: 2;  cholecalciferol (VITAMIN D) 1000 UNITS capsule, Take 1 capsule by mouth daily. Take with 2000 unit capsule for a daily dose of 3000 units, Disp: 30 capsule, Rfl: 11  Cholecalciferol (VITAMIN D) 2000 UNITS CAPS, Take 1 capsule by mouth daily., Disp: 30 capsule, Rfl: 5;  levonorgestrel (MIRENA) 20 MCG/24HR IUD, 1 Device by Intrauterine route continuous. As Instructed, Disp: 1 Device, Rfl: 0;  pentosan polysulfate (ELMIRON) 100 MG capsule, Take 1 capsule by mouth 3 (three) times daily before meals., Disp: 90 capsule, Rfl: 11  Ammonium Lactate 10 % CREA, Apply 15 mLs Topically 2 (two) times daily as needed., Disp: 1 Bottle, Rfl: 11;  mupirocin (BACTROBAN) 2 % ointment, APPLY TOPICALLY THREE TIMES DAILY, Disp: 22 g, Rfl: 11    Review of Patient's  Allergies indicates:   Cat dander              Runny Nose    Physical Exam:  A focused dermatologic exam of the face, hands, scalp, chest, back, and upper extremities.  -AOx3   - Most prominently on the periorbital skin but also on the lateral aspects of the cheek there are dyspigmented patches with intermixed hyper, hyopigmentation and telangiectasias   - On the chest, in a V distribution, as well as on the posterior neck, there are dyspigmented patches with intermixed hyper/hypopigmentation  and telangiectasias   - There is a background of blanchable pink patches overlying these changes on the forehead and proximal upper extremities  - There is normal oral aperture   - there is no heliotrope sign   - Bilateral upper extremities are very thin with apparent loss of the subcutaneous fat. There is some decrease in flexion mobility about the elbows   - The palms are clear   - The bilateral lower extremities have 2+ symmetric pitting edema   - No sclerodactyly, no distal digital pitting  - Prominent nailfold telangiectasias    A/P: Very pleasant 38 year old female seen in dermatology clinic today for f/u evaluation of:    Regarding dermatomyositis  - Emphasized importance of application of SPF 30 or greater sunscreen regularly during sun exposure. Provided samples today  - She can use clobetasol 0.05% cream 1-2 times daily to affected areas of extremities and chest for relief of inflammation from sun exposure   -She is currently on plaquenil, methotrexate, cellcept, and prednisone, with significant subjective improvement in muscular function  .  Vaginal erosions  - Favor traumatic, DFA negative  - Resolved today  .  RTC 5 months or earlier PRN flare

## 2014-06-10 ENCOUNTER — Other Ambulatory Visit (HOSPITAL_BASED_OUTPATIENT_CLINIC_OR_DEPARTMENT_OTHER): Payer: Self-pay | Admitting: Internal Medicine

## 2014-06-11 ENCOUNTER — Encounter (HOSPITAL_BASED_OUTPATIENT_CLINIC_OR_DEPARTMENT_OTHER): Payer: Self-pay | Admitting: Internal Medicine

## 2014-06-12 ENCOUNTER — Other Ambulatory Visit (HOSPITAL_BASED_OUTPATIENT_CLINIC_OR_DEPARTMENT_OTHER): Payer: Self-pay | Admitting: Internal Medicine

## 2014-06-15 ENCOUNTER — Ambulatory Visit (HOSPITAL_BASED_OUTPATIENT_CLINIC_OR_DEPARTMENT_OTHER): Payer: PRIVATE HEALTH INSURANCE | Admitting: Primary Podiatric Medicine

## 2014-08-03 ENCOUNTER — Other Ambulatory Visit (HOSPITAL_BASED_OUTPATIENT_CLINIC_OR_DEPARTMENT_OTHER): Payer: Self-pay | Admitting: Internal Medicine

## 2014-08-08 ENCOUNTER — Telehealth (HOSPITAL_BASED_OUTPATIENT_CLINIC_OR_DEPARTMENT_OTHER): Payer: Self-pay | Admitting: ESP Geriatrics

## 2014-08-08 ENCOUNTER — Ambulatory Visit (HOSPITAL_BASED_OUTPATIENT_CLINIC_OR_DEPARTMENT_OTHER): Payer: Self-pay | Admitting: Surgery

## 2014-08-08 NOTE — Progress Notes (Signed)
Spoke with pt who was informed that her script for mycophenolate (Cellcept) 500 mg take 3 tabs po 2 x daily was sent to her Pharmacy.  Pt sated that she received a call from her Pharmacy and was on her way to pick it up.  Pt was reminded of her up comming appoint with Dr Lorane Gell scheduled  on 09/04/14.   Pt verbalized understanding.  Will notify provider.

## 2014-08-13 ENCOUNTER — Other Ambulatory Visit (HOSPITAL_BASED_OUTPATIENT_CLINIC_OR_DEPARTMENT_OTHER): Payer: Self-pay | Admitting: Internal Medicine

## 2014-08-14 NOTE — Progress Notes (Signed)
Please disregard above refill request.  Script was written on 08/07/14.  Will notify provider.

## 2014-08-15 NOTE — Progress Notes (Signed)
Patient needs labs ASAP!  CBC WITH DIFF, HEPATIC PANEL, CREATININE, GFR      HER APPT SHOULD BE MOVED UP FROM [facility]

## 2014-08-16 ENCOUNTER — Telehealth (HOSPITAL_BASED_OUTPATIENT_CLINIC_OR_DEPARTMENT_OTHER): Payer: Self-pay | Admitting: ESP Geriatrics

## 2014-08-16 NOTE — Telephone Encounter (Signed)
-----   Message from [facility] sent at 08/16/2014 12:50 PM EST -----  Regarding: chapnick patient  Contact: [phone]  Patient has appt on 09/04/14 with Dr.chapnick, was told to come in to do blood work before appt. Patient does not think she can do blood work before appt. Pls advise

## 2014-08-16 NOTE — Progress Notes (Signed)
Attempted to reach patient.  Left message, that she will need to have her lab done prior to her next appoint scheduled in November with Dr Lorane Gell .  Also,that the lab sheet is been mailed to her house.  Will notify provider.

## 2014-08-16 NOTE — Progress Notes (Signed)
Spoke with pt who was educated on the importance of keeping  her scheduled appoint on 09/04/14.  Otherwise, Dr Lorane Gell would be unable to renew her medications because her blood level needs to be monitored.  Pt verbalized understanding and agreed with the plan.  Will notify provider.

## 2014-09-02 ENCOUNTER — Other Ambulatory Visit (HOSPITAL_BASED_OUTPATIENT_CLINIC_OR_DEPARTMENT_OTHER): Payer: Self-pay | Admitting: Internal Medicine

## 2014-09-04 ENCOUNTER — Encounter (HOSPITAL_BASED_OUTPATIENT_CLINIC_OR_DEPARTMENT_OTHER): Payer: Self-pay | Admitting: Internal Medicine

## 2014-09-04 ENCOUNTER — Ambulatory Visit (HOSPITAL_BASED_OUTPATIENT_CLINIC_OR_DEPARTMENT_OTHER): Payer: PRIVATE HEALTH INSURANCE | Admitting: Internal Medicine

## 2014-09-04 VITALS — BP 128/89 | HR 80 | Wt 142.0 lb

## 2014-09-04 DIAGNOSIS — M3313 Other dermatomyositis without myopathy: Secondary | ICD-10-CM

## 2014-09-04 DIAGNOSIS — M3390 Dermatopolymyositis, unspecified, organ involvement unspecified: Secondary | ICD-10-CM

## 2014-09-04 DIAGNOSIS — E559 Vitamin D deficiency, unspecified: Secondary | ICD-10-CM

## 2014-09-04 DIAGNOSIS — M349 Systemic sclerosis, unspecified: Secondary | ICD-10-CM

## 2014-09-04 LAB — CBC, PLATELET & DIFFERENTIAL
ABSOLUTE BASO COUNT: 0 10*3/uL (ref 0.0–0.1)
ABSOLUTE EOSINOPHIL COUNT: 0.1 10*3/uL (ref 0.0–0.8)
ABSOLUTE IMM GRAN COUNT: 0.01 10*3/uL (ref 0.00–0.03)
ABSOLUTE LYMPH COUNT: 1.1 10*3/uL (ref 0.6–5.9)
ABSOLUTE MONO COUNT: 0.6 10*3/uL (ref 0.2–1.4)
ABSOLUTE NEUTROPHIL COUNT: 3.1 10*3/uL (ref 1.6–8.3)
BASOPHIL %: 0.2 % (ref 0.0–1.2)
EOSINOPHIL %: 2.5 % (ref 0.0–7.0)
HEMATOCRIT: 34.7 % (ref 34.1–44.9)
HEMOGLOBIN: 11.8 g/dL (ref 11.2–15.7)
IMMATURE GRANULOCYTE %: 0.2 % (ref 0.0–0.4)
LYMPHOCYTE %: 21.9 % (ref 15.0–54.0)
MEAN CORP HGB CONC: 34 g/dL (ref 31.0–37.0)
MEAN CORPUSCULAR HGB: 32.9 pg (ref 26.0–34.0)
MEAN CORPUSCULAR VOL: 96.7 fL (ref 80.0–100.0)
MEAN PLATELET VOLUME: 10.2 fL (ref 8.7–12.5)
MONOCYTE %: 12.7 % (ref 4.0–13.0)
NEUTROPHIL %: 62.5 % (ref 40.0–75.0)
PLATELET COUNT: 228 10*3/uL (ref 150–400)
RBC DISTRIBUTION WIDTH STD DEV: 44.6 fL (ref 35.1–46.3)
RBC DISTRIBUTION WIDTH: 12.8 % (ref 11.5–14.3)
RED BLOOD CELL COUNT: 3.59 M/uL — ABNORMAL LOW (ref 3.90–5.20)
WHITE BLOOD CELL COUNT: 4.9 10*3/uL (ref 4.0–11.0)

## 2014-09-04 LAB — HEPATIC FUNCTION PANEL
ALANINE AMINOTRANSFERASE: 23 U/L (ref 12–45)
ALBUMIN: 3.9 g/dL (ref 3.4–5.0)
ALKALINE PHOSPHATASE: 35 U/L — ABNORMAL LOW (ref 45–117)
ASPARTATE AMINOTRANSFERASE: 23 U/L (ref 8–34)
BILIRUBIN DIRECT: 0.2 mg/dl (ref 0.0–0.2)
BILIRUBIN TOTAL: 0.6 mg/dL (ref 0.2–1.0)
INDIRECT BILIRUBIN: 0.4 mg/dL (ref 0.2–0.9)
TOTAL PROTEIN: 7.5 g/dL (ref 6.4–8.2)

## 2014-09-04 LAB — ESTIMATED GLOMERULAR FILT RATE: ESTIMATED GLOMERULAR FILT RATE: >60 mL/min (ref >60)

## 2014-09-04 LAB — TOTAL PROTEIN RANDOM URINE: TOTAL PROTEIN RANDOM URINE: 29 mg/dL — ABNORMAL HIGH (ref 0–15)

## 2014-09-04 LAB — CREATINE KINASE TOTAL: CREATINE KINASE TOTAL: 198 IU/L (ref 26–192)

## 2014-09-04 LAB — VITAMIN D,25 HYDROXY: VITAMIN D,25 HYDROXY: 43 ng/mL (ref 30.0–100.0)

## 2014-09-04 LAB — CREATININE RANDOM URINE: CREATININE RANDOM URINE: 80 mg/dL

## 2014-09-04 LAB — CREATININE: CREATININE: 0.2 mg/dL — ABNORMAL LOW (ref 0.4–1.2)

## 2014-09-04 MED ORDER — MYCOPHENOLATE MOFETIL 500 MG PO TABS
ORAL_TABLET | ORAL | Status: DC
Start: 2014-09-04 — End: 2015-01-03

## 2014-09-04 MED ORDER — METHOTREXATE 2.5 MG PO TABS
ORAL_TABLET | ORAL | Status: DC
Start: 2014-09-04 — End: 2015-02-26

## 2014-09-04 MED ORDER — HYDROXYCHLOROQUINE SULFATE 200 MG PO TABS
400.0000 mg | ORAL_TABLET | Freq: Every day | ORAL | Status: DC
Start: 2014-09-04 — End: 2015-03-26

## 2014-09-04 MED ORDER — VITAMIN D 50 MCG (2000 UT) PO CAPS
2.0000 | ORAL_CAPSULE | Freq: Every day | ORAL | Status: DC
Start: 2014-09-04 — End: 2015-08-30

## 2014-09-04 NOTE — Progress Notes (Signed)
The primary progress note for this visit has been dictated through E-Scription. It can be viewed as an attachment to this encounter or through Chart Review under the NOTE Tab as an Rheumatology Office Note.      Review of Systems: Constitutional, Eyes, ENT/Mouth, Cardiovascular, Respiratory, GI, GU, Neuro, Psych, Heme/Lymph, Skin, Musculoskeletal, and Endocrine systems were reviewed and are NEGATIVE, except for what is dictated in the note.      >50% of a 40 minute visit was spent counseling, educating, reviewing outside medical records, and coordinating medical care, outside of the time needed to perform any procedures.

## 2014-09-04 NOTE — Progress Notes (Signed)
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  She has no findings of systemic lupus erythematosus.  She has a history of a breast lump that showed features of dermatomyositis and bladder erythema and erythema nodosum.    She takes mycophenolate 2500 mg a day in divided dosing (dose was decreased last time from 3000 mg).  She takes methotrexate 25 mg weekly and 400 mg of hydroxychloroquine daily.  She is not on prednisone.  She has been using ammonium lactate on her scalp as well as on the rest of her skin.  She is no longer using the Bactroban.    She is feeling relatively well overall.  She denies weakness, fevers, chest pain, dyspnea, cough, rash, and Raynaud's.    PAST MEDICAL HISTORY:    PFTs from November 2014 showed obstruction  History of shingles on the face complicated by cellulitis  Breast lump, thought to be related to dermatomyositis  Proteinuria  Seasonal allergies  Anemia  History of erythema nodosum  Focal bladder erythema     ALLERGIES AND ADVERSE REACTIONS:  No known drug allergies.    CURRENT MEDICATIONS:  Calcium, vitamin D, ammonium lactate, folic acid, hydroxychloroquine, Mirena IUD, methotrexate, mycophenolate, pentosan polysulfate.     PHYSICAL EXAMINATION:  VITAL SIGNS:  Weight 142 pounds, BMI 22.  Blood pressure 128/89, pulse 80, SpO2 100%.  Pain 0/10.  GENERAL:  She is in no acute distress.  She is alert and oriented.  Good eye contact, full affect.  HEENT:  Oropharynx is clear.  NECK:  Supple.  LUNGS:  Clear.  EXTREMITIES:  Warm.  She has pitting edema bilaterally.  MUSCULOSKELETAL:  There is no synovitis.  SKIN:  She has areas of atrophy, hypopigmentation, hyperpigmentation, and the forearms feel sclerodermatous.  There are no digital ulcerations.  She has what looks like telangiectasias on the fingertips.  Her scalp has  areas of hypopigmentation, hyperpigmentation, and atrophy.  There are some areas of superficial rawness.    LABORATORY DATA:  Creatinine 0.3, GFR greater than 60  CRP less than 0.3  CK 150  WBC 4.2; hematocrit 35; platelets 215,000  Urinalysis without hematuria or proteinuria  Recent ANA was negative, but she has a history of elevated ANA    CCP antibody; rheumatoid factor; anticardiolipin antibody; lupus anticoagulant; mitochondrial antibody; Smith, RNP, Sjogren's antibodies; ANCA; and Jo-1 negative     ASSESSMENT:  [name] with dermatomyositis, history of erythema nodosum, history of inflammatory breast lump, erythema in the bladder of unclear etiology, and sclerodermatous changes on the arms.  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    PLAN:    1.  I would like her to decrease her mycophenolate to 1000 mg twice a day.    2.  She will stay on methotrexate.  Surveillance labs today.  3.  She was given refills of all of her medications.  4.  Boost up her vitamin D.  5.  She was sent for influenza vaccination today.  6.  I will obtain updated PFTs.  7.  I would like to check a baseline CT scan of her chest.  8.  If possible, I would like to check PM-Scl, which is an antibody that can be seen in an overlap between dermatomyositis and scleroderma.  9.  I have asked the patient not to  use ammonium lactate on her scalp unless advised by her dermatologist.  10.  I will see patient back in 3 months, earlier if needed.    ___________________________  Reviewed and Electronically Signed By: Coolidge Breeze MD  Sig Date: 10/16/2014  Sig Time: 13:24:54  Dictated By: Coolidge Breeze MD  Dict Date: 09/04/2014 Dict Time: 05 24 PM    Dictation Date and Time:09/04/2014 17:24:03  Transcription Date and Time:09/04/2014 19:09:54  eScription Dictation id: [id number] Confirmation # :[id number]      cc: Royal Hawthorn MD

## 2014-09-04 NOTE — Patient Instructions (Addendum)
Decrease mycophenolate to 2 pills twice a day.

## 2014-09-05 ENCOUNTER — Ambulatory Visit (HOSPITAL_BASED_OUTPATIENT_CLINIC_OR_DEPARTMENT_OTHER): Payer: PRIVATE HEALTH INSURANCE | Admitting: Internal Medicine

## 2014-09-05 LAB — C3 COMPLEMENT: C3 COMPLEMENT: 95 (ref 90–180)

## 2014-09-05 LAB — ANTISCLERODERMA 70 ANTIBODY: ANTISCLERODERMA 70 ANTIBODY: <0.2 AI (ref 0.0–0.9)

## 2014-09-05 LAB — C4 COMPLEMENT: C4 COMPLEMENT: 19 (ref 9–36)

## 2014-09-05 LAB — ANTI-DNA AB DOUBLE STRAND: ANTI-DNA AB DOUBLE STRAND: <1 IU/mL (ref 0–9)

## 2014-09-11 ENCOUNTER — Ambulatory Visit (HOSPITAL_BASED_OUTPATIENT_CLINIC_OR_DEPARTMENT_OTHER): Payer: PRIVATE HEALTH INSURANCE | Admitting: Internal Medicine

## 2014-09-13 ENCOUNTER — Ambulatory Visit (HOSPITAL_BASED_OUTPATIENT_CLINIC_OR_DEPARTMENT_OTHER): Payer: Self-pay | Admitting: Internal Medicine

## 2014-09-13 LAB — US BREAST-AXILLA RIGHT

## 2014-09-13 LAB — MA DIAGNOSTIC MAMMO BILATERAL WITH CAD

## 2014-09-19 ENCOUNTER — Ambulatory Visit: Payer: Self-pay | Admitting: Internal Medicine

## 2014-09-27 ENCOUNTER — Other Ambulatory Visit (HOSPITAL_BASED_OUTPATIENT_CLINIC_OR_DEPARTMENT_OTHER): Payer: Self-pay | Admitting: Internal Medicine

## 2014-09-27 ENCOUNTER — Other Ambulatory Visit (HOSPITAL_BASED_OUTPATIENT_CLINIC_OR_DEPARTMENT_OTHER): Payer: Self-pay | Admitting: Surgical

## 2014-09-27 NOTE — Progress Notes (Signed)
PER Pharmacy, Julie Freeman is a 38 year old female has requested a refill of ELMIRON 100MG  TABLET.      Last Office Visit: 03/14/2014  Last Physical Exam: 03/14/2014      Other Med Adult:  Most Recent BP Reading(s)  09/04/14 : 128/89        CHOLESTEROL (mg/dL)   Date Value   01/24/2013 124   ----------    LOW DENSITY LIPOPROTEIN DIRECT (mg/dL)   Date Value   01/24/2013 51   ----------    HIGH DENSITY LIPOPROTEIN (mg/dL)   Date Value   01/24/2013 63*   ----------    TRIGLYCERIDES (mg/dl)   Date Value   09/10/2011 51   ----------      No results found for: TSHSC      No results found for: TSH      HEMOGLOBIN A1C (%)   Date Value   01/24/2013 5.0   ----------        INR (no units)   Date Value   10/10/2010 1.1*   ----------       Documented patient preferred pharmacies:    [id number] [place], [place] - [address]  Phone: [phone] Fax: [phone]

## 2014-09-28 ENCOUNTER — Other Ambulatory Visit (HOSPITAL_BASED_OUTPATIENT_CLINIC_OR_DEPARTMENT_OTHER): Payer: Self-pay | Admitting: Registered Nurse

## 2014-09-28 NOTE — Progress Notes (Signed)
erroneous

## 2014-10-01 ENCOUNTER — Telehealth (HOSPITAL_BASED_OUTPATIENT_CLINIC_OR_DEPARTMENT_OTHER): Payer: Self-pay | Admitting: Geriatric Medicine

## 2014-10-01 ENCOUNTER — Telehealth (HOSPITAL_BASED_OUTPATIENT_CLINIC_OR_DEPARTMENT_OTHER): Payer: Self-pay | Admitting: Internal Medicine

## 2014-10-01 NOTE — Progress Notes (Unsigned)
Medical Specialty Prior Authorization    Prior authorization request for clobetasol (TEMOVATE) 0.05 % external solution      Patient has [employer] for prescription coverage.  I.D.# [id number]  Phone number: [phone]

## 2014-10-01 NOTE — Telephone Encounter (Signed)
-----  Message from Royal Hawthorn, MD sent at 09/28/2014  7:26 PM EST -----  Hi,     Could you please call this pt to set up a physical with me anytime after 03/14/14.  I haven't met her and I'd like to, but if everything is well, I don't need to until her physical is due.     Thanks, - Caren Griffins

## 2014-10-01 NOTE — Progress Notes (Unsigned)
PA for Clobetasol( Temovate) 0.05% external solution faxed to [facility].  Awaiting for response.  Will notify provider.

## 2014-10-01 NOTE — Progress Notes (Unsigned)
PRIOR AUTHORIZATION FOR Clobetasol 0.05% External Solution    Patient has [employer] for prescription coverage.  I.D.#: [id number]    Filled out [place] form. Scanned form into Epic under Environmental health practitioner (PA for Clobetasol 0.05% External Solution).    Please print out form to be reviewed, signed, and faxed to insurance company.    Once the form has been signed and faxed, please scan signed form into Environmental health practitioner.

## 2014-10-01 NOTE — Progress Notes (Signed)
This team member called pt to schedule Physical/Establish care appt with Dr Amie Critchley.  Pt due for PE after 03/15/15 but if pt needs to be scheduled before please schedule accordingly and note NEW.     Pt did not answer, this team member l/m asking for a call back.     Erum Cercone PARII, 10/01/2014, 10:46 AM

## 2014-10-02 MED ORDER — FLUOCINONIDE 0.05 % EX SOLN
CUTANEOUS | Status: AC
Start: 2014-10-02 — End: 2015-01-01

## 2014-10-02 NOTE — Progress Notes (Unsigned)
PRIOR AUTHORIZATION FOR Clobetasol 0.05% solution    The prior authorization has been denied. The denial notice has been scanned into Environmental health practitioner.      Denial Reason(s):    - Patient must try and fail one high potency and one very high potency topical corticosteroid prior to approval.  Very High Potency Topical Corticosteroids  Betamethasone Dipropionate 0.05% ointment  Betamethasone Dipropionate 0.05% gel  High Potency Topical Corticosteroids  Betamethasone Dipropionate 0.05% cream  Betamethasone Dipropionate 0.05% ointment  Betamethasone Dipropionate Augmented 0.05% cream  Betamethasone Valerate 0.1% ointment  Desoximetasone 0.25% cream  Desoximetasone 0.25% ointment  Fluocinonide 0.05% cream  Fluocinonide 0.05% ointment  Fluocinonide 0.05% gel  Fluocinonide 0.05% solution  Triamcinolone 0.5% cream  Triamcinolone 0.5% ointment  - ***  PA #: ***

## 2014-10-02 NOTE — Progress Notes (Unsigned)
.  Attempted to call patient 3x.  Phone picks up and then it is dead air.    Steroid solution potency was changed based on insurance coverage restrictions.

## 2014-10-03 ENCOUNTER — Encounter (HOSPITAL_BASED_OUTPATIENT_CLINIC_OR_DEPARTMENT_OTHER): Payer: Self-pay | Admitting: Internal Medicine

## 2014-10-15 ENCOUNTER — Ambulatory Visit (HOSPITAL_BASED_OUTPATIENT_CLINIC_OR_DEPARTMENT_OTHER): Payer: PRIVATE HEALTH INSURANCE | Admitting: Geriatric Medicine

## 2014-10-15 ENCOUNTER — Encounter (HOSPITAL_BASED_OUTPATIENT_CLINIC_OR_DEPARTMENT_OTHER): Payer: Self-pay | Admitting: Geriatric Medicine

## 2014-10-15 VITALS — BP 120/80 | Wt 136.0 lb

## 2014-10-15 DIAGNOSIS — M349 Systemic sclerosis, unspecified: Secondary | ICD-10-CM

## 2014-10-15 DIAGNOSIS — J302 Other seasonal allergic rhinitis: Secondary | ICD-10-CM | POA: Diagnosis not present

## 2014-10-15 DIAGNOSIS — M3390 Dermatopolymyositis, unspecified, organ involvement unspecified: Secondary | ICD-10-CM | POA: Diagnosis not present

## 2014-10-15 DIAGNOSIS — Z23 Encounter for immunization: Secondary | ICD-10-CM

## 2014-10-15 DIAGNOSIS — M3313 Other dermatomyositis without myopathy: Secondary | ICD-10-CM

## 2014-10-15 NOTE — Progress Notes (Signed)
Received the PSV 23 vaccine without any  Complication and no s/s discomfort noted post injection.  VIS given prior to administration and reviewed with the patient and or legal guardian. Patient understands the disease and the vaccine. See immunization/Injection module or chart review for date of publication and additional information.    Phoebe Sharps LPN    Influenza Vaccine Procedure  October 15, 2014    1. Has the patient received the information for the influenza vaccine? Yes    2. Does the patient have any of the following contraindications?  Allergy to eggs? No  Allergic reaction to previous influenza vaccines? No  Any other problems to previous influenza vaccines? No  Paralyzed by Guillain-Barre syndrome?  No  Current moderate or severe illness? No  Allergy to contact lens solution? No    3. The vaccine has been administered in the usual fashion.     Immunization information reviewed. Current VIS reviewed and given to patient/ guardian. Verbal assent obtained from patient/ guardian.  See immunization/Injection module or chart review for date of publication and additional information. Verbal assent obtained from patient/guardian. Comfort measures for possible side effects reviewed.

## 2014-10-15 NOTE — Patient Instructions (Addendum)
I hope you have a wonderful new year. I look forwards to seeing you next Fall of 2017 for your next physical and pap smear, or sooner should you need anything.     So lovely to meet you today.    - Dr Amie Critchley

## 2014-10-15 NOTE — Progress Notes (Signed)
-S-    Julie Freeman, is a 39 year old F who presents to clinic today for follow-up of dermatomyositis and scleroderma.    Last seen June by Dr Hardin Negus and at that time had complete physical exam, is new pt to me .  Since then, has been fighting a cold recently but is slowly getting better. SOB only when very fatigued. Has close f/u with Dr Lorane Gell. LIfe busier recently , and at the end of the day often feels very wiped, but during the day usually has enough energy to do what needs to do, esp if is getting enough sleep.     Today worried about needs fluvax and to meet new PCP.     Has apt on 1/20 for CT and PFTs .     Review of Systems: Negative for  fevers, chills, HA, CP, palpitations, dizziness, blurred  Vision, hearing loss, back pain, abd pain, changes in bowel or bladder habits; no unintentional wt loss or excessive wt gain; no night sweats, rashes, leg edema, joint swelling, or joint pain.     LMP = 10/08/14 or so, usually 7d, no cramps thanks to IUD!     Social history updated in EPIC. Has new relationship, all is going well.     -O-   10/15/14  1444   BP: 120/80   Weight: 61.689 kg (136 lb)         Physical Exam:   GENERAL: No acute distress, non-toxic. Very pleasant woman. Very thin  SKIN:  Warm & Dry, no rash. Diffuse pigment changes consistent with dermatomyositis and skin thickening/ tautening consistent with scleroderma , primarily at upper body and face  HEAD:  NCAT. Sclerae are anicteric and aninjected, oropharynx is clear with moist mucous membranes.  LUNGS:  Clear to auscultation bilaterally. No wheezes, rales, rhonchi.   HEART:  RRR.  No murmurs, rubs, or gallops.   NEUROLOGIC:  Alert and oriented x4; moves all extremities well; speaking in clear fluent sentences. CNsII-XII symmetrical and intact. Sensation intact to light touch throughout. 5/5 strength globally.  PSYCHIATRIC:  Appropriate for age, time of day, and situation      - A / P -     (M33.90) Dermatomyositis  (primary encounter  diagnosis); (M34.9) Scleroderma  Comment: Grossly stable, sees Dr Lorane Gell of Rheum regularly for this. No changes in sx recently. Most worried about skin and skin changes, but seeing derm for this   Plan: - Continue close f/u with Dr Lorane Gell of Rheum and Dr Corey Harold of Dermatology. Medications per their recommendations.     (Z23) Need for prophylactic vaccination and inoculation against influenza  Comment: Requested by pt, appropriate.   Plan: IMMUNIZATION ADMIN SINGLE, RN, INFLUENZA VIRUS         QUAD PRESV FREE VACCINE 3/> YRS IM (PUBLIC)    (P32.9) Seasonal allergies  Comment: Stable  Plan: Continue to follow     (Z23) Need for prophylactic vaccination against Streptococcus pneumoniae (pneumococcus)  Comment: Routine for immunocompromized pt   Plan: PNEUMO VAC PSV-23 (PRIVATE)      RHCM: up to date; PCV 23 as above     Follow-up in 2 yrs or PRN     We discussed Julie Freeman 's new and current medications, including risks, benefits, and potential side effects. The patient expressed understanding and no barriers to adherence were identified.     Julie Freeman  indicated understanding of and agreement with above plan.      10/15/2014  VIS given  prior to administration and reviewed with the patient and or legal guardian. Patient understands the disease and the vaccine. See immunization/Injection module or chart review for date of publication and additional information.  Julie Griffins Larri Yehle,MD

## 2014-10-16 ENCOUNTER — Ambulatory Visit (HOSPITAL_BASED_OUTPATIENT_CLINIC_OR_DEPARTMENT_OTHER): Payer: PRIVATE HEALTH INSURANCE | Admitting: Dermatology

## 2014-10-16 DIAGNOSIS — M3390 Dermatopolymyositis, unspecified, organ involvement unspecified: Secondary | ICD-10-CM

## 2014-10-16 DIAGNOSIS — M3313 Other dermatomyositis without myopathy: Secondary | ICD-10-CM

## 2014-10-16 NOTE — Progress Notes (Signed)
Angell Pincock is a very pleasant 39 year old female who presents to the dermatology clinic today for follow-up.    Last derm visit date: 05/2014  Last derm visit diagnoses/concerns: dermatomyositis.  Last derm visit treatments:  Regarding dermatomyositis  - Emphasized importance of application of SPF 30 or greater sunscreen regularly during sun exposure. Provided samples today  - She can use clobetasol 0.05% cream 1-2 times daily to affected areas of extremities and chest for relief of inflammation from sun exposure   -She is currently on plaquenil, methotrexate, cellcept, and prednisone, with significant subjective improvement in muscular function    Interim history:    Pt is pleasant 39 yo F w/ hx of dermatomyositis who presents today for f/u. Pt continues on 25 mg methotrexate weekly. Since last visit her dose of mycophenolate has decreased to 2000 mg daily. Vyctoria is off of  Prednisone at this time. Pt reports that since her last visit, her skin has been stable. She has not noted new rash or pruritic or painful skin lesions. She reports that her skin is generally under much better control in the winter compared to the summer months. She has no new dermatologic complaints or concerns at this time.    Other dermatologic history: dermatomyositis as above    Other interim changes in patient's health: Pt denies    ROS: No recent fevers, chills, night sweats, weight loss, nausea, vomiting, diarrhea, headaches, cough, shortness of breath, arthralgias, myalgias        Past Medical History    Dermatomyositis     Shingles        Current outpatient prescriptions: fluocinonide (LIDEX) 0.05 % external solution, To scalp bid x 2 week.  2 weeks off.  Can repeat., Disp: 60 mL, Rfl: 3;  [employer] D3 2000 UNITS CAPS, TAKE 1 CAPSULE BY MOUTH DAILY., Disp: 30 capsule, Rfl: 5;  mupirocin (BACTROBAN) 2 % ointment, APPLY TOPICALLY THREE TIMES DAILY, Disp: 22 g, Rfl: 5  clobetasol (TEMOVATE) 0.05 % external solution, Apply  topically 2  (two) times daily as needed. Two weeks on, two weeks off, Disp: 50 mL, Rfl: 3;  ELMIRON 100 MG capsule, TAKE ONE CAPSULE BY MOUTH 3 TIMES A DAY BEFORE MEALS, Disp: 90 capsule, Rfl: 11;  mycophenolate (CELLCEPT) 500 MG tablet, 2 tabs po bid, Disp: 120 tablet, Rfl: 3;  methotrexate 2.5 MG tablet, TAKE 10 TABLETS BY MOUTH ONCE A WEEK- IN DIVIDED DOSING, Disp: 40 tablet, Rfl: 3  hydroxychloroquine (PLAQUENIL) 200 MG tablet, Take 2 tablets by mouth daily., Disp: 60 tablet, Rfl: 3;  Cholecalciferol (VITAMIN D) 2000 UNITS CAPS, Take 2 capsules by mouth daily., Disp: 60 capsule, Rfl: 5;  ammonium lactate (LAC-HYDRIN) 12 % lotion, APPLY 15MLS TOPICALLY 2 TIMES A DAY AS NEEDED, Disp: 937 mL, Rfl: 3;  folic acid (FOLVITE) 1 MG tablet, TAKE 2 TABLETS BY MOUTH DAILY, Disp: 60 tablet, Rfl: 5  Calcium Carbonate-Vitamin D 600-400 MG-UNIT TABS Tablet, TAKE ONE (1) TABLET TWICE DAILY. DO NOT TAKE WITHIN A FEW HOURS OF MYCOPHENOLATE., Disp: 60 tablet, Rfl: 11;  levonorgestrel (MIRENA) 20 MCG/24HR IUD, 1 Device by Intrauterine route continuous. As Instructed, Disp: 1 Device, Rfl: 0    Review of Patient's Allergies indicates:   Cat dander              Runny Nose    Physical Exam:  A focused dermatologic exam of the face, hands, scalp,distal upper extremities:  -AOx3   - Most prominently on the periorbital skin but also  on the lateral aspects of the cheek there are dyspigmented patches with intermixed hyper, hyopigmentation and telangiectasias   - On the chest, in a V distribution, as well as on the posterior neck, there are dyspigmented patches with intermixed hyper/hypopigmentation and telangiectasias   - There is normal oral aperture   - there is no heliotrope sign   - Bilateral upper extremities are very thin with apparent loss of the subcutaneous fat.   - The palms are clear   - The bilateral lower extremities have 2+ symmetric pitting edema   - No sclerodactyly, no distal digital pitting  - Prominent nailfold  telangiectasias    A/P: Very pleasant 39 year old female seen in dermatology clinic today for f/u evaluation of:    Dermatomyositis  - Stable today from derm perspective  - Emphasized importance of application of SPF 30 or greater sunscreen regularly during sun exposure. Provided samples today  - She can use clobetasol 0.05% cream 1-2 times daily to affected areas of extremities and chest for relief of inflammation from sun exposure   -She is currently on plaquenil, methotrexate, cellcept with significant subjective improvement in muscular function  - She will continue close follow-up with Dr.Chapnick   -Reviewed gentle skin care measures at length with patient including taking short lukewarm showers, using a gentle moisturizing soap such as dove or cetaphil, and use of an emollient cream after bathing such as dove/cetaphil/Eucerin/CeraVe  .  RTC 6 months or earlier if needed PRN

## 2014-10-16 NOTE — Progress Notes (Signed)
Pt is safe at home

## 2014-10-16 NOTE — Progress Notes (Signed)
PRECEPTOR NOTE  I personally interviewed and examined the patient today. I reviewed findings with resident, Dr. Schoettler.  I confirm the key elements of history and physical exam as described in resident's note.  I agree with the assessment and plan as described below.  Please see resident's note for further details.

## 2014-10-17 ENCOUNTER — Ambulatory Visit (HOSPITAL_BASED_OUTPATIENT_CLINIC_OR_DEPARTMENT_OTHER): Payer: PRIVATE HEALTH INSURANCE | Admitting: Ophthalmology

## 2014-10-17 DIAGNOSIS — H5213 Myopia, bilateral: Secondary | ICD-10-CM | POA: Diagnosis not present

## 2014-10-17 DIAGNOSIS — Z79899 Other long term (current) drug therapy: Secondary | ICD-10-CM

## 2014-10-17 DIAGNOSIS — M349 Systemic sclerosis, unspecified: Secondary | ICD-10-CM | POA: Diagnosis not present

## 2014-10-17 DIAGNOSIS — H5210 Myopia, unspecified eye: Secondary | ICD-10-CM

## 2014-10-17 DIAGNOSIS — H52203 Unspecified astigmatism, bilateral: Secondary | ICD-10-CM | POA: Diagnosis not present

## 2014-10-17 NOTE — Nursing Note (Signed)
68 YF here for f/u She takes Plaquenil and steoids for scleroderma and dermatomyositis.    05/04/14 HVF     VA has been stable no changes that she has noticed with her glasses

## 2014-10-17 NOTE — Progress Notes (Signed)
.  Julie Freeman had an eye exam because she takes plaquenil/hydroxychloroquine .    The dilated fundus exam did not reveal any maculopathy.     New revised recommendations on screening for plaquenil maculopathy suggest that after an initial baseline exam patients taking plaquenil should have annual screening starting after 5 years.    New data have shown that the risk of toxicity increases sharply towards 1% after 5-7 years of use or after a cumulative dose of 1000g.    The patient should be monitored with automated visual field testing and macula OCT yearly after screening is initiated.    She was given an amsler grid to help self monitor her vision.    She was also instructed to return to the clinic immediately and to stop taking Plaquenil if any abnormalities in the vision develop.

## 2014-10-18 ENCOUNTER — Ambulatory Visit (HOSPITAL_BASED_OUTPATIENT_CLINIC_OR_DEPARTMENT_OTHER): Payer: PRIVATE HEALTH INSURANCE | Admitting: Internal Medicine

## 2014-10-18 ENCOUNTER — Ambulatory Visit (HOSPITAL_BASED_OUTPATIENT_CLINIC_OR_DEPARTMENT_OTHER): Payer: PRIVATE HEALTH INSURANCE | Admitting: Ophthalmology

## 2014-10-22 ENCOUNTER — Ambulatory Visit: Payer: Self-pay | Admitting: Internal Medicine

## 2014-10-22 ENCOUNTER — Ambulatory Visit (HOSPITAL_BASED_OUTPATIENT_CLINIC_OR_DEPARTMENT_OTHER): Payer: PRIVATE HEALTH INSURANCE | Admitting: Internal Medicine

## 2014-10-30 ENCOUNTER — Telehealth (HOSPITAL_BASED_OUTPATIENT_CLINIC_OR_DEPARTMENT_OTHER): Payer: Self-pay | Admitting: ESP Geriatrics

## 2014-10-30 NOTE — Telephone Encounter (Signed)
-----   Message from [name] sent at 10/30/2014  3:23 PM EST -----  Regarding: Chapnick pt; letter request  Contact: [phone]      [name] [id number], 39 year old, female, Telephone Information:  Home Phone      [phone]  Work Phone      Not on file.  Mobile          [phone]      Patient's Preferred Pharmacy:     [id number] - [place], [place] - [place]  Phone: [phone] Fax: [phone]      CONFIRMED TODAY: Yes    CALL BACK NUMBER: [phone]  Best time to call back: any  Cell phone:   Other phone:    Available times:    Patient's language of care: English    Patient does not need an interpreter.    Patient's PCP: Rica Mote, MD    Person calling on behalf of patient: Patient (self)    Calls today to request letter stating she can't work due to her illness

## 2014-10-30 NOTE — Progress Notes (Signed)
Attempted to reach patient.  Left message, for the pt to contact the office.  Will notify provider.

## 2014-10-31 ENCOUNTER — Ambulatory Visit (HOSPITAL_BASED_OUTPATIENT_CLINIC_OR_DEPARTMENT_OTHER): Payer: Self-pay | Admitting: Internal Medicine

## 2014-10-31 DIAGNOSIS — L940 Localized scleroderma [morphea]: Secondary | ICD-10-CM | POA: Diagnosis not present

## 2014-10-31 DIAGNOSIS — R918 Other nonspecific abnormal finding of lung field: Secondary | ICD-10-CM | POA: Diagnosis not present

## 2014-10-31 DIAGNOSIS — M3390 Dermatopolymyositis, unspecified, organ involvement unspecified: Secondary | ICD-10-CM

## 2014-10-31 DIAGNOSIS — M3313 Other dermatomyositis without myopathy: Secondary | ICD-10-CM

## 2014-10-31 DIAGNOSIS — M349 Systemic sclerosis, unspecified: Secondary | ICD-10-CM | POA: Diagnosis not present

## 2014-10-31 LAB — CT CHEST W CONTRAST

## 2014-10-31 MED ORDER — IOHEXOL 300 MG/ML IJ SOLN
70.00 mL | Freq: Once | INTRAMUSCULAR | Status: AC
Start: 2014-10-31 — End: 2014-10-31
  Administered 2014-10-31: 70 mL via INTRAVENOUS

## 2014-10-31 MED ORDER — NORMAL SALINE FLUSH 0.9 % IV SOLN
60.00 mL | Freq: Once | INTRAVENOUS | Status: AC
Start: 2014-10-31 — End: 2014-10-31
  Administered 2014-10-31: 60 mL via INTRAVENOUS

## 2014-10-31 NOTE — Addendum Note (Signed)
Addended by: Enrigue Catena on: 10/31/2014 11:01 AM     Modules accepted: Orders

## 2014-10-31 NOTE — Progress Notes (Signed)
.  Patient has been doing well.    She needs to explain what about her illness that she feels interferes with her working and what type of work activities she feels that she is limited in completing successfully.Julie Freeman

## 2014-10-31 NOTE — Addendum Note (Signed)
Addended by: Peri Jefferson on: 10/31/2014 11:57 AM     Modules accepted: Orders

## 2014-11-01 ENCOUNTER — Other Ambulatory Visit (HOSPITAL_BASED_OUTPATIENT_CLINIC_OR_DEPARTMENT_OTHER): Payer: Self-pay | Admitting: Internal Medicine

## 2014-11-01 NOTE — Telephone Encounter (Signed)
-----   Message from Rozelle Logan, RN sent at 10/31/2014  4:31 PM EST -----  Regarding: FW: Chapnick pt; letter request  Contact: [phone]      ----- Message -----     From: [name]     Sent: 10/31/2014   4:24 PM       To: [facility] Rn/Pa Pool  Subject: FW: Chapnick pt; letter request                  Pt returned [name]'s call re: below request    ----- Message -----     From: [name]     Sent: 10/30/2014   3:23 PM       To: [facility] Rn/Pa Pool  Subject: Chapnick pt; letter request                          Julie Freeman [id number], 39 year old, female, Telephone Information:  Home Phone      [phone]  Work Phone      Not on file.  Mobile          [phone]      Patient's Preferred Pharmacy:     [id number] - [place], [place] - [place]  Phone: [phone] Fax: [phone]      CONFIRMED TODAY: Yes    CALL BACK NUMBER: [phone]  Best time to call back: any  Cell phone:   Other phone:    Available times:    Patient's language of care: English    Patient does not need an interpreter.    Patient's PCP: Rica Mote, MD    Person calling on behalf of patient: Patient (self)    Calls today to request letter stating she can't work due to her illness

## 2014-11-01 NOTE — Telephone Encounter (Signed)
-----   Message from Sharin Grave sent at 11/01/2014 12:56 PM EST -----  Regarding: chapnick ptletter needed  Contact: [phone]  Pt is calling requesting to speak to [name] in regards to a letter please call          Julie Freeman [id number], 39 year old, female, Telephone Information:  Home Phone      [phone]  Work Phone      Not on file.  Mobile          [phone]      Patient's Preferred Pharmacy:     [id number] - [place], [place]  Phone: [phone] Fax: [phone]      CONFIRMED TODAY: No    CALL BACK NUMBER: [phone]  Best time to call back: asap  Cell phone:   Other phone:    Available times:    Patient's language of care: English    Patient does not need an interpreter.    Patient's PCP: Rica Mote, MD    Person calling on behalf of patient: Patient (self)    Calls today to speak to nurse only.  with questions and concerns.

## 2014-11-01 NOTE — Progress Notes (Signed)
Letter needs to be detailed about exactly what elements at her current job is causing her issues.    Will discuss at her next visit.  She can schedule an earlier appt if she chooses to discuss earlier.

## 2014-11-01 NOTE — Progress Notes (Signed)
Spoke with pt who stated that due to her illness / condition, she is limited in her speed, strength and flexibility.  Her skin condition / appearance causes a lot of issues with temperature hot and cold, pealing which causes her to itch.  Pt stated that she is also applying for disability.  Will notify provider.

## 2014-11-01 NOTE — Progress Notes (Signed)
Attempted to reach patient.  Left message, for the pt to contact the office with more details about the letter she is requesting.  Will notify provider.

## 2014-11-05 NOTE — Procedures (Signed)
Date of Service: 10/31/2014    INDICATIONS:  Scleroderma.    Flow volume loop and complete data may be viewed in the media section of the Epic medical record.    ATS criteria for reproducibility and acceptability were met with the exception of the end of test criteria, as the patient may not have exhaled fully.  The study is interpreted with some caution.    The FEV1 appears at the lower range of normal at 2.88 L (81% predicted).  The FVC appears normal at 3.70 L (86% predicted).  Their ratio is slightly reduced at 78%.  Flows at mid volumes are reduced to approximately 60% predicted, flows at low lung volumes to 70% predicted.  Flows at mid volumes do show some response to inhaled bronchodilator.  Total lung capacity by plethysmography is normal at 6.30 L (107% predicted).  The residual volume is increased at 2.60 L (133% predicted).  Their ratio is significantly increased at 41%.  Diffusing capacity appears normal.    Patient was ambulated at a brisk pace for a distance of 500 feet.  There were no complaints of shortness of breath.  At rest, the heart rate was 92 beats per minute, oxygen saturation 97%.  With ambulation, the heart rate rose to 101 beats per minute; oxygen saturation did fall mildly to 94%.    Compared to the previous study in October 2014, the FEV1 has increased by approximately 400 mL, which is minimally statistically significant.  The FVC has increased by 300 mL, which is not statistically significant.  The total lung capacity has increased by approximately 800 mL.  The residual volume is increased significantly by almost a liter.  Diffusing capacity has dropped from 121% predicted to 99% predicted.    SUMMARY:  Reduction in flows at mid and low lung volumes, some of which does reverse with inhaled bronchodilator.  There is mild gas trapping due to the obstruction.  There is a decrement in the diffusing capacity compared to the previous study in October 2014.  There is mild arterial oxygen  desaturation with minimal exercise, which was not present in 2014.    ___________________________  Reviewed and Electronically Signed By: Omer Jack MD  Sig Date: 11/07/2014  Sig Time: 10:52:03  Dictated By: Omer Jack MD  Dict Date: 11/05/2014 Dict Time: 11 55 AM    Dictation Date and Time:11/05/2014 11:55:13  Transcription Date and Time:11/05/2014 12:11:43  eScription Dictation id: [id number] Confirmation # :[id number]      cc: Deliah Goody MD

## 2014-11-05 NOTE — Progress Notes (Signed)
Spoke with pt who was offered an appoint on 11/07/14 which she declined but accepted an appoint on 11/15/14 appoint with Dr Lorane Gell to discuss in details about  the letter she is requesting.  Pt verbalized understanding and agreed with plan.  Will notify provider.

## 2014-11-07 ENCOUNTER — Ambulatory Visit (HOSPITAL_BASED_OUTPATIENT_CLINIC_OR_DEPARTMENT_OTHER): Payer: PRIVATE HEALTH INSURANCE | Admitting: Internal Medicine

## 2014-11-15 ENCOUNTER — Ambulatory Visit (HOSPITAL_BASED_OUTPATIENT_CLINIC_OR_DEPARTMENT_OTHER): Payer: PRIVATE HEALTH INSURANCE | Admitting: Internal Medicine

## 2014-11-28 ENCOUNTER — Other Ambulatory Visit (HOSPITAL_BASED_OUTPATIENT_CLINIC_OR_DEPARTMENT_OTHER): Payer: Self-pay | Admitting: Internal Medicine

## 2014-11-28 ENCOUNTER — Ambulatory Visit (HOSPITAL_BASED_OUTPATIENT_CLINIC_OR_DEPARTMENT_OTHER): Payer: PRIVATE HEALTH INSURANCE | Admitting: Internal Medicine

## 2014-11-28 VITALS — BP 110/78 | HR 88 | Temp 97.6°F | Ht 68.0 in | Wt 134.0 lb

## 2014-11-28 DIAGNOSIS — R7689 Other specified abnormal immunological findings in serum: Secondary | ICD-10-CM

## 2014-11-28 DIAGNOSIS — M349 Systemic sclerosis, unspecified: Secondary | ICD-10-CM

## 2014-11-28 DIAGNOSIS — M79641 Pain in right hand: Secondary | ICD-10-CM | POA: Diagnosis not present

## 2014-11-28 DIAGNOSIS — M3313 Other dermatomyositis without myopathy: Secondary | ICD-10-CM

## 2014-11-28 DIAGNOSIS — M79642 Pain in left hand: Secondary | ICD-10-CM

## 2014-11-28 DIAGNOSIS — R768 Other specified abnormal immunological findings in serum: Secondary | ICD-10-CM

## 2014-11-28 DIAGNOSIS — R942 Abnormal results of pulmonary function studies: Secondary | ICD-10-CM

## 2014-11-28 DIAGNOSIS — M79609 Pain in unspecified limb: Secondary | ICD-10-CM

## 2014-11-28 DIAGNOSIS — R760 Raised antibody titer: Secondary | ICD-10-CM

## 2014-11-28 DIAGNOSIS — M3390 Dermatopolymyositis, unspecified, organ involvement unspecified: Secondary | ICD-10-CM

## 2014-11-28 DIAGNOSIS — L930 Discoid lupus erythematosus: Secondary | ICD-10-CM

## 2014-11-28 LAB — CBC, PLATELET & DIFFERENTIAL
ABSOLUTE BASO COUNT: 0 10*3/uL (ref 0.0–0.1)
ABSOLUTE EOSINOPHIL COUNT: 0.2 10*3/uL (ref 0.0–0.8)
ABSOLUTE IMM GRAN COUNT: 0.01 10*3/uL (ref 0.00–0.03)
ABSOLUTE LYMPH COUNT: 0.9 10*3/uL (ref 0.6–5.9)
ABSOLUTE MONO COUNT: 0.6 10*3/uL (ref 0.2–1.4)
ABSOLUTE NEUTROPHIL COUNT: 3.3 10*3/uL (ref 1.6–8.3)
BASOPHIL %: 0.6 % (ref 0.0–1.2)
EOSINOPHIL %: 4 % (ref 0.0–7.0)
HEMATOCRIT: 38.2 % (ref 34.1–44.9)
HEMOGLOBIN: 13.2 g/dL (ref 11.2–15.7)
IMMATURE GRANULOCYTE %: 0.2 % (ref 0.0–0.4)
LYMPHOCYTE %: 16.9 % (ref 15.0–54.0)
MEAN CORP HGB CONC: 34.6 g/dL (ref 31.0–37.0)
MEAN CORPUSCULAR HGB: 33.3 pg (ref 26.0–34.0)
MEAN CORPUSCULAR VOL: 96.5 fL (ref 80.0–100.0)
MEAN PLATELET VOLUME: 10 fL (ref 8.7–12.5)
MONOCYTE %: 12.7 % (ref 4.0–13.0)
NEUTROPHIL %: 65.6 % (ref 40.0–75.0)
PLATELET COUNT: 262 10*3/uL (ref 150–400)
RBC DISTRIBUTION WIDTH STD DEV: 44.9 fL (ref 35.1–46.3)
RBC DISTRIBUTION WIDTH: 12.8 % (ref 11.5–14.3)
RED BLOOD CELL COUNT: 3.96 M/uL (ref 3.90–5.20)
WHITE BLOOD CELL COUNT: 5 10*3/uL (ref 4.0–11.0)

## 2014-11-28 LAB — CREATININE: CREATININE: 0.4 mg/dL (ref 0.4–1.2)

## 2014-11-28 LAB — URINALYSIS
BILIRUBIN, URINE: NEGATIVE
GLUCOSE, URINE: NEGATIVE MG/DL
KETONE, URINE: NEGATIVE MG/DL
LEUKOCYTE ESTERASE: NEGATIVE
NITRITE, URINE: NEGATIVE
OCCULT BLOOD, URINE: NEGATIVE
PH URINE: 5.5 (ref 5.0–8.0)
PROTEIN, URINE: NEGATIVE MG/DL
SPECIFIC GRAVITY URINE: 1.025 (ref 1.003–1.035)

## 2014-11-28 LAB — ESTIMATED GLOMERULAR FILT RATE: ESTIMATED GLOMERULAR FILT RATE: >60 mL/min (ref >60)

## 2014-11-28 NOTE — Progress Notes (Signed)
DM/scleroderma/discoid lupus    MMF 2500mg --> down to 2000mg   MTX 25mg - po  400mg  HCQ      pfts- nasal congestion, no dyspnea  No exercising      Plan:  paperwork  Ref pulm    The primary progress note for this visit has been dictated through E-Scription. It can be viewed as an attachment to this encounter or through Chart Review under the NOTE Tab as an Rheumatology Office Note.      Review of Systems: Constitutional, Eyes, ENT/Mouth, Cardiovascular, Respiratory, GI, GU, Neuro, Psych, Heme/Lymph, Skin, Musculoskeletal, and Endocrine systems were reviewed and are NEGATIVE, except for what is dictated in the note.        >50% of a >40 minute visit was spent counseling, educating, reviewing outside medical records, and coordinating medical care, outside of the time needed to perform any procedures.

## 2014-11-28 NOTE — Progress Notes (Signed)
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  She has a history of discoid lesions on the scalp.  No findings of systemic lupus erythematosus.  She has a history of erythema nodosum.  She has a history of an inflammatory mass in the breast that was consistent with dermatomyositis.    Recent PFTs showed some evidence of air trapping and decreased DLCO.  There was some response to bronchodilators.    Recent CT scan shows small amount of ill-defined peribronchial opacity in the right upper lobe (differential diagnosis inflammatory disease or scarring), pulmonary apical scarring, and mild air trapping in the left lower lobe.  There was borderline subcarinal lymphadenopathy.  The patient denies cough, dyspnea, and fatigue with exertion.  No falls or weakness.  She has never smoked.    She takes mycophenolate 204m mg daily (down from 2500 mg after her last visit in November).  She takes methotrexate 25 mg p.o. weekly.  She takes hydroxychloroquine 400 mg daily.    She comes in with paperwork today to be filled out to help her maintain her food stamps.  She asked me to write a letter with a blanket statement saying that she cannot work to help her obtain disability.  The patient describes doing tasks slower than she did before.  She reports difficulty with doing some tasks because of a mild left elbow contracture.  She describes pain in the fingers when doing activities with her hands (there has been no evidence of inflammatory arthritis).    PAST MEDICAL HISTORY:  1.  History of shingles on the face complicated by cellulitis  2.  Breast lump thought to be related to dermatomyositis  3.  Proteinuria  4.  Seasonal allergies  5.  Anemia  6.  History of erythema nodosum  7.  Focal bladder erythema    ALLERGIES AND ADVERSE REACTIONS:  No known drug allergies.    CURRENT MEDICATIONS:  Ammonium lactate, calcium, vitamin D, Elmiron, folic  acid 2 mg, hydroxychloroquine 400 mg, methotrexate 25 mg, Mirena IUD, mycophenolate 10048mb.i.d.    SOCIAL HISTORY:  The patient has sex with women.  She has an IUD in place.    PHYSICAL EXAMINATION:  VITAL SIGNS:  Height 5 feet 8 inches, weight 134 pounds, BMI 20, blood pressure 110/78, pulse 88, temperature 97.6, SpO2 of 100%, pain 6/10.  GENERAL:  She is in no acute distress.  She is alert and oriented.  Good eye contact.  Full affect.  HEENT:  Anicteric sclerae.  Oropharynx clear.  NECK:  Supple, no lymphadenopathy.  LUNGS:  Clear.  EXTREMITIES:  Warm and well perfused.  There is no edema, no clubbing.  SKIN:  Areas of hyperpigmentation, hypopigmentation, sclerodermatous skin on the forearms and a little bit on the chest.  Erythema over the MCPs and PIPs, areas of telangiectasias, atrophy on the skin on the hands.  MUSCULOSKELETAL:  I do not appreciate synovitis.  She does not have tender joints.  She is able to get up and down from a seated position.  There is no weakness.    LABORATORY DATA:  Creatinine 0.2, GFR greater than 60, AST 23, ALT 23  CK 198  C3 of 95, C4 of 19  Protein to creatinine ratio 29:80  Double-stranded DNA negative  Anti-SCL 70 negative  WBC 4.9; hematocrit 35; platelets 228,000  ESR 13  Urinalysis without hematuria or proteinuria    ASSESSMENT:  A 39 year old woman with dermatomyositis, scleroderma, and discoid lupus who had an inflammatory mass in the breast related to dermatomyositis, erythema in the bladder (potentially related to dermatomyositis), erythema nodosum, obstructive pattern on pulmonary function tests with decreased diffusing capacity of lungs for carbon monoxide and a CT scan that shows the possibility of an inflammatory area.    PLAN:  1.  Referral to pulmonary to get recommendations on CT scan findings and PFT findings.  2.  Continue mycophenolate 1019m twice a day.  3.  Continue methotrexate.  Surveillance labs today.  4.  Continue hydroxychloroquine.  Follow up with  ophthalmology.  5.  Continue topical therapy.  6.  Filled out paperwork for her food stamps.  7.  I declined writing a blanket letter stating that she cannot work.  I would be happy to fill out disability paperwork to the best of my ability based on objective questions.  She does not have inflammatory arthritis or deformities in the joints that would prevent her from doing many activities.  She describes pain in the hands and difficulty doing fine motor movements because of skin involvement.  She has morbidity from her skin manifestations which can have psychological impact, especially in the workplace.      I will see the patient back in 3 months, earlier if needed.    She verbalized an understanding and agreement with this plan.    ___________________________  Reviewed and Electronically Signed By: SCoolidge BreezeMD  Sig Date: 12/04/2014  Sig Time: 16:49:48  Dictated By: SCoolidge BreezeMD  Dict Date: 11/28/2014 Dict Time: 03 05 PM    Dictation Date and Time:11/28/2014 15:05:35  Transcription Date and Time:11/28/2014 15:54:26  eScription Dictation id: [id number] # ::[id number]     cc: CRoyal HawthornMD

## 2014-11-29 ENCOUNTER — Encounter (HOSPITAL_BASED_OUTPATIENT_CLINIC_OR_DEPARTMENT_OTHER): Payer: Self-pay | Admitting: Internal Medicine

## 2014-11-29 LAB — C4 COMPLEMENT: C4 COMPLEMENT: 20 mg/dL (ref 14–44)

## 2014-11-29 LAB — ANTI-DNA AB DOUBLE STRAND: ANTI-DNA AB DOUBLE STRAND: <1 IU/mL (ref 0–9)

## 2014-11-29 LAB — C3 COMPLEMENT: C3 COMPLEMENT: 106 mg/dL (ref 82–167)

## 2014-12-12 ENCOUNTER — Encounter (HOSPITAL_BASED_OUTPATIENT_CLINIC_OR_DEPARTMENT_OTHER): Payer: Self-pay | Admitting: Geriatric Medicine

## 2014-12-12 DIAGNOSIS — R918 Other nonspecific abnormal finding of lung field: Secondary | ICD-10-CM | POA: Insufficient documentation

## 2014-12-24 LAB — MISCELLANEOUS REFERENCE BLOOD: REFERENCE LAB TEST RESULT: 22.9

## 2014-12-27 ENCOUNTER — Other Ambulatory Visit (HOSPITAL_BASED_OUTPATIENT_CLINIC_OR_DEPARTMENT_OTHER): Payer: Self-pay | Admitting: Internal Medicine

## 2015-01-25 ENCOUNTER — Other Ambulatory Visit (HOSPITAL_BASED_OUTPATIENT_CLINIC_OR_DEPARTMENT_OTHER): Payer: Self-pay | Admitting: Internal Medicine

## 2015-02-06 ENCOUNTER — Ambulatory Visit (HOSPITAL_BASED_OUTPATIENT_CLINIC_OR_DEPARTMENT_OTHER): Payer: PRIVATE HEALTH INSURANCE | Admitting: Pulmonary Disease

## 2015-02-06 ENCOUNTER — Telehealth (HOSPITAL_BASED_OUTPATIENT_CLINIC_OR_DEPARTMENT_OTHER): Payer: Self-pay | Admitting: ESP Geriatrics

## 2015-02-06 NOTE — Telephone Encounter (Signed)
-----   Message from [name] sent at 02/05/2015  2:11 PM EDT -----  Regarding: [facility] pt; FYI  Contact: [phone]      Alyssha Housh [id number], 39 year old, female, Telephone Information:  Home Phone      [phone]  Work Phone      Not on file.  Mobile          [phone]      Patient's Preferred Pharmacy:     [id number] - [place], [place] - [place]  Phone: [phone] Fax: [phone]      CONFIRMED TODAY: Yes    CALL BACK NUMBER: [phone]  Best time to call back: any  Cell phone:   Other phone:    Available times:    Patient's language of care: English    Patient does not need an interpreter.    Patient's PCP: Rica Mote, MD    Person calling on behalf of patient: Patient (self)    Calls today b/c her request for disability was denied; her lawyer is appealing and will be sending [place] ppwk, she'd like [place] to call her when it's completed and returned

## 2015-02-06 NOTE — Progress Notes (Signed)
Attempted to reach patient x 2 .  Left message, informing the pt that once the disability papers get to the office it will be sent to  Dr Lorane Gell.  Will notify provider.

## 2015-02-26 ENCOUNTER — Other Ambulatory Visit (HOSPITAL_BASED_OUTPATIENT_CLINIC_OR_DEPARTMENT_OTHER): Payer: Self-pay | Admitting: Internal Medicine

## 2015-02-27 ENCOUNTER — Ambulatory Visit (HOSPITAL_BASED_OUTPATIENT_CLINIC_OR_DEPARTMENT_OTHER): Payer: PRIVATE HEALTH INSURANCE | Admitting: Internal Medicine

## 2015-02-27 ENCOUNTER — Encounter (HOSPITAL_BASED_OUTPATIENT_CLINIC_OR_DEPARTMENT_OTHER): Payer: Self-pay | Admitting: Internal Medicine

## 2015-02-27 VITALS — BP 134/86 | HR 74 | Wt 141.0 lb

## 2015-02-27 DIAGNOSIS — R942 Abnormal results of pulmonary function studies: Secondary | ICD-10-CM | POA: Diagnosis not present

## 2015-02-27 DIAGNOSIS — R1310 Dysphagia, unspecified: Secondary | ICD-10-CM

## 2015-02-27 DIAGNOSIS — L930 Discoid lupus erythematosus: Secondary | ICD-10-CM

## 2015-02-27 DIAGNOSIS — M3390 Dermatopolymyositis, unspecified, organ involvement unspecified: Secondary | ICD-10-CM | POA: Diagnosis not present

## 2015-02-27 DIAGNOSIS — M349 Systemic sclerosis, unspecified: Secondary | ICD-10-CM

## 2015-02-27 DIAGNOSIS — R768 Other specified abnormal immunological findings in serum: Secondary | ICD-10-CM

## 2015-02-27 DIAGNOSIS — R7689 Other specified abnormal immunological findings in serum: Secondary | ICD-10-CM

## 2015-02-27 DIAGNOSIS — M3313 Other dermatomyositis without myopathy: Secondary | ICD-10-CM

## 2015-02-27 LAB — CREATINE KINASE TOTAL: CREATINE KINASE TOTAL: 239 IU/L (ref 26–192)

## 2015-02-27 LAB — CBC WITH PLATELET
HEMATOCRIT: 34 % — ABNORMAL LOW (ref 34.1–44.9)
HEMOGLOBIN: 11.3 g/dL (ref 11.2–15.7)
MEAN CORP HGB CONC: 33.2 g/dL (ref 31.0–37.0)
MEAN CORPUSCULAR HGB: 32.5 pg (ref 26.0–34.0)
MEAN CORPUSCULAR VOL: 97.7 fL (ref 80.0–100.0)
MEAN PLATELET VOLUME: 10.3 fL (ref 8.7–12.5)
PLATELET COUNT: 219 10*3/uL (ref 150–400)
RBC DISTRIBUTION WIDTH STD DEV: 44.6 fL (ref 35.1–46.3)
RBC DISTRIBUTION WIDTH: 12.6 % (ref 11.5–14.3)
RED BLOOD CELL COUNT: 3.48 M/uL — ABNORMAL LOW (ref 3.90–5.20)
WHITE BLOOD CELL COUNT: 4.1 10*3/uL (ref 4.0–11.0)

## 2015-02-27 LAB — HEPATIC FUNCTION PANEL
ALANINE AMINOTRANSFERASE: 22 U/L (ref 12–45)
ALBUMIN: 4 g/dL (ref 3.4–5.0)
ALKALINE PHOSPHATASE: 33 U/L — ABNORMAL LOW (ref 45–117)
ASPARTATE AMINOTRANSFERASE: 26 U/L (ref 8–34)
BILIRUBIN DIRECT: 0.2 mg/dl (ref 0.0–0.2)
BILIRUBIN TOTAL: 0.7 mg/dL (ref 0.2–1.0)
INDIRECT BILIRUBIN: 0.5 mg/dL (ref 0.2–0.9)
TOTAL PROTEIN: 7.7 g/dL (ref 6.4–8.2)

## 2015-02-27 LAB — RBC SEDIMENTATION RATE: RBC SEDIMENTATION RATE: 17 MM/HR — ABNORMAL HIGH (ref 0–15)

## 2015-02-27 LAB — ESTIMATED GLOMERULAR FILT RATE: ESTIMATED GLOMERULAR FILT RATE: >60 mL/min (ref >60)

## 2015-02-27 LAB — CREATININE: CREATININE: 0.4 mg/dL (ref 0.4–1.2)

## 2015-02-27 LAB — C-REACTIVE PROTEIN: C-REACTIVE PROTEIN: <0.3 mg/dL (ref 0.0–1.8)

## 2015-02-27 NOTE — Progress Notes (Signed)
The primary progress note for this visit has been dictated through E-Scription. It can be viewed as an attachment to this encounter or through Chart Review under the NOTE Tab as an Rheumatology Office Note.      Review of Systems: Constitutional, Eyes, ENT/Mouth, Cardiovascular, Respiratory, GI, GU, Neuro, Psych, Heme/Lymph, Skin, Musculoskeletal, and Endocrine systems were reviewed and are NEGATIVE, except for what is dictated in the note.

## 2015-02-27 NOTE — Progress Notes (Signed)
Date of Service: 02/27/2015    HISTORY OF PRESENT ILLNESS:  This is a follow-up visit for this 39 year old woman who has dermatomyositis and scleroderma.  She has had a history of discoid lupus lesions on the scalp.  She has a history of erythema nodosum.  She has an inflammatory mass in the breast thought to be consistent with dermatomyositis.  She has PFTs that show evidence of air trapping and decreased DLCO with response to bronchodilators.  CT scan of the chest showed peribronchial opacity and apical scarring.  Double strand DNA and complements have been normal.      The patient denies chest pain, dyspnea, and cough.  She is describing occasional dysphagia, which is mild.  She denies unintentional weight loss and abdominal pain.  Currently, does not have GERD-like symptoms.  Joint pain is well controlled on methotrexate.    The patient is in school.  She missed her pulmonary appointment and has one scheduled for July.    The patient has not been currently active.    PAST MEDICAL HISTORY:    1.  History of zoster on the face complicated by cellulitis   2.  Inflammatory breast lump related to dermatomyositis   3.  History of proteinuria   4.  Seasonal allergies  5.  Anemia  6.  Erythema nodosum   7.  Focal bladder erythema on cystoscopy  8.  Discoid lupus  9.  Dermatomyositis  10.  Scleroderma  11.  Decreased DLCO    ALLERGIES AND ADVERSE REACTIONS:  No known drug allergies.    CURRENT MEDICATIONS:  Vitamin D 4000 units, hydroxychloroquine 400 mg, methotrexate 25 mg, mycophenolate 1000 mg twice a day.    PHYSICAL EXAMINATION:  VITAL SIGNS:  Weight 141 pounds, blood pressure 134/86, pulse 74, SpO2 of 98%, pain 0/10.  GENERAL:  She is in no acute distress.  She is alert and oriented.  Good eye contact.  Full affect.  SKIN:  No new rash, the sclerodermatous changes on her arms, chest, and abdomen, areas of vitiligo, sclerodermatous changes on the hands.  Hands are warm and well perfused.  MUSCULOSKELETAL:  I did not  appreciate synovitis.  She has elbow contractures.  No synovitis in the knees  LUNGS:  Decreased breath sounds, but I do not appreciate wheezes or rales.      LABORATORY DATA:  Creatinine 0.4, GFR greater than 60     C4-2.0  WBC 5; hematocrit 38; platelets 262,000  PM/SCL antibody negative  Double-stranded DNA less than 1  Urinalysis without hematuria or proteinuria in February  Jo1 negative  SCL 70 negative, CCP antibody negative  Barium swallow October 2014 was normal  Echocardiogram April 2015 showed an EF of 55% and an estimated right ventricular pressure of 23 mmHg.    ASSESSMENT:  A 39 year old woman with dermatomyositis, diffuse scleroderma, decreased carbon dioxide diffusion in the lung, mild dysphagia, discoid lupus, history of erythema nodosum and inflammatory arthritis.    The patient is feeling relatively well.    PLAN:    1.  Continue methotrexate, hydroxychloroquine, and mycophenolate.    2.  Follow up with ophthalmology.  3.  Labs today.  4.  Follow up with pulmonary.   5.  She was encouraged to exercise.  6.  I have asked her to take small sips of water and chew her food very well all while eating small bites.  If the symptoms continue she will let me know.    ___________________________  Reviewed and  Electronically Signed By: Coolidge Breeze MD  Sig Date: 08/13/2015  Sig Time: 16:04:57  Dictated By: Coolidge Breeze MD  Dict Date: 02/27/2015 Dict Time: 12 42 PM    Dictation Date and Time:02/27/2015 12:42:43  Transcription Date and Time:02/27/2015 14:47:37  eScription Dictation id: [id number] Confirmation # :[id number]      cc: Royal Hawthorn MD

## 2015-02-28 LAB — ANTI-CENTROMERE B ANTIBODIES: ANTI-CENTROMERE B ANTIBODIES: <0.2 AI (ref 0.0–0.9)

## 2015-02-28 LAB — ANTI-DNA AB DOUBLE STRAND: ANTI-DNA AB DOUBLE STRAND: <1 IU/mL (ref 0–9)

## 2015-02-28 LAB — C3 COMPLEMENT: C3 COMPLEMENT: 96 mg/dL (ref 82–167)

## 2015-02-28 LAB — C4 COMPLEMENT: C4 COMPLEMENT: 17 mg/dL (ref 14–44)

## 2015-03-05 ENCOUNTER — Ambulatory Visit (HOSPITAL_BASED_OUTPATIENT_CLINIC_OR_DEPARTMENT_OTHER): Payer: Self-pay | Admitting: Urology

## 2015-03-05 DIAGNOSIS — N83201 Unspecified ovarian cyst, right side: Secondary | ICD-10-CM

## 2015-03-05 DIAGNOSIS — N83202 Unspecified ovarian cyst, left side: Principal | ICD-10-CM

## 2015-03-14 DIAGNOSIS — F4323 Adjustment disorder with mixed anxiety and depressed mood: Secondary | ICD-10-CM | POA: Diagnosis not present

## 2015-03-18 ENCOUNTER — Ambulatory Visit (HOSPITAL_BASED_OUTPATIENT_CLINIC_OR_DEPARTMENT_OTHER): Payer: PRIVATE HEALTH INSURANCE

## 2015-03-18 DIAGNOSIS — M349 Systemic sclerosis, unspecified: Secondary | ICD-10-CM

## 2015-03-18 DIAGNOSIS — M3390 Dermatopolymyositis, unspecified, organ involvement unspecified: Secondary | ICD-10-CM

## 2015-03-18 DIAGNOSIS — M3313 Other dermatomyositis without myopathy: Secondary | ICD-10-CM

## 2015-03-19 NOTE — Progress Notes (Signed)
Procedure performed by technician under supervision of attending MD.

## 2015-03-20 DIAGNOSIS — D251 Intramural leiomyoma of uterus: Secondary | ICD-10-CM | POA: Diagnosis not present

## 2015-03-20 DIAGNOSIS — Z975 Presence of (intrauterine) contraceptive device: Secondary | ICD-10-CM | POA: Diagnosis not present

## 2015-03-20 LAB — US PELVIC NON-OB W TRANSVAG, 3D

## 2015-03-20 NOTE — Addendum Note (Signed)
Addended by: Jilda Roche on: 03/20/2015 01:23 PM     Modules accepted: Orders

## 2015-03-26 ENCOUNTER — Ambulatory Visit (HOSPITAL_BASED_OUTPATIENT_CLINIC_OR_DEPARTMENT_OTHER): Payer: PRIVATE HEALTH INSURANCE | Admitting: Urology

## 2015-03-26 ENCOUNTER — Other Ambulatory Visit (HOSPITAL_BASED_OUTPATIENT_CLINIC_OR_DEPARTMENT_OTHER): Payer: Self-pay | Admitting: Internal Medicine

## 2015-03-27 ENCOUNTER — Encounter (HOSPITAL_BASED_OUTPATIENT_CLINIC_OR_DEPARTMENT_OTHER): Payer: Self-pay | Admitting: Internal Medicine

## 2015-03-27 DIAGNOSIS — F4323 Adjustment disorder with mixed anxiety and depressed mood: Secondary | ICD-10-CM | POA: Diagnosis not present

## 2015-04-01 ENCOUNTER — Encounter (HOSPITAL_BASED_OUTPATIENT_CLINIC_OR_DEPARTMENT_OTHER): Payer: Self-pay | Admitting: Urology

## 2015-04-01 ENCOUNTER — Ambulatory Visit (HOSPITAL_BASED_OUTPATIENT_CLINIC_OR_DEPARTMENT_OTHER): Payer: PRIVATE HEALTH INSURANCE | Admitting: Urology

## 2015-04-01 DIAGNOSIS — F4323 Adjustment disorder with mixed anxiety and depressed mood: Secondary | ICD-10-CM | POA: Diagnosis not present

## 2015-04-01 DIAGNOSIS — M349 Systemic sclerosis, unspecified: Secondary | ICD-10-CM | POA: Diagnosis not present

## 2015-04-01 DIAGNOSIS — R3915 Urgency of urination: Secondary | ICD-10-CM | POA: Diagnosis not present

## 2015-04-01 NOTE — Progress Notes (Signed)
I have seen and examined the patient today.   I agree with the assessment and plan of Zenaida Deed, as delineated in her note from today.      Elmiron has helped, but still poor QOL.   I described possibility of botox, the procedure, some risks.  She is interested.  Plan for UDS, then possible botox.    Esaw Grandchild, MD, 04/01/2015, 3:37 PM

## 2015-04-01 NOTE — Progress Notes (Signed)
CC: Urgency, frequency     HPI: Pt is a 39 year old female with a history of dermatomyositis seen in f/u for chronic urinary urgency and frequency.  Bx of bladder shows only chronic inflammation. Has been on Elmiron with some improvement - feels it has helped with urgency, although she still experiences this along with frequency q2hrs. Nocturia average 1x depending on what she drinks. Doesn't always feel she empties and will push to get the urine out - to avoid going more often. Denies GH, dysuira/pain, GH, SUI, UUI.      Doesn't have GYN - 11 year ago. Paps done by PCP.  Remains on plaquenil, methotrexate, and cellcept.     DIET: Minimal coffee, does love spicy food.    RANDOM BLADDER, BIOPSY 09/2013:  - MILD CHRONIC INFLAMMATORY CHANGES ONLY.  - THE APPEARANCES ARE NON-DIAGNOSTIC.      HISTORY OF ILLNESS AT INITIAL PRESENTATION TO OUR OFFICE:   :  Ms. Byrns is a pleasant 39 year old female who has a very severe case of dermatomyositis versus scleroderma, for which she is currently on Plaquenil and methotrexate.  She has been on these meds for approximately 8 years.  She has also been on CellCept and prednisone in the past.  All of her symptoms started at the age of 23, right before her pregnancy.  She developed muscle weakness in both her upper and lower extremities.    With regards to her urination, these symptoms started about the same time.  Her main complaint is frequency, both daytime and nighttime.  She urinates every 1-2 hours during the day and 1-2 times per night.  She actually has a voiding diary, which she brought with her today, and she urinates anywhere from 1-6 ounces every 1-3 hours during the day.  She denies any history of gross hematuria or dysuria.  She has no obstructive symptoms.  Should mention that she does have oral ulcers associated with her systemic disorder.    The patient has a history of 1 vaginal delivery.  She is premenopausal.  She uses an IUD for birth control.  She is  sexually active but denies any dyspareunia or vaginal dryness.  She does not have a history of bladder infections.  Urinalysis in September 2014 was negative, and urine culture in June 2014 showed only less than 10K mixed.      REVIEW OF SYSTEMS:  Constitutional: Patient denies any unexpected weight change, chronic fatigue, fevers or chills.  Eyes: No changes in vision  Respiratory: Patient denies any shortness of breath, wheezing, or new unexplained cough.  Cardiovascular: Patient denies any chest pains, chest palpitations, or chest tightness.  GI: Patient denies abdominal pain and has a good appetite, no nausea/vomiting  GU: As per HPI.  MS: Patient denies any flank pain, back pain, myalgias, or recent skeletal fractures.  Neurologic: Patient denies any new recent headaches or change in vision.    Psychiatry: Patient denies any new confusion, depression or anxiety.  Heme: no easily or new bruising      PMH/PSH/Meds/ALL/SH/FH all reviewed and updated in Epic system    PHYSICAL EXAM:  Vital Signs: as recorded  General: Patient is well-developed, well-nourished, and in no apparent distress.  Eyes: Conjunctivae pink, no jaundice  Head and Face: Face is symmetric  Neuro: Alert and oriented to time, place, and person.  Affect is appropriate.  Neck: trachea midline.    Lungs: No respiratory distress, no intercostal retractions, no use of accessory muscles.  DERM: difficuse sclertotic changes, +  vitiligo  Genitourinary (07/2013):  Labia majora, minora, and vulva are without lesion or erythema.  Bartholin, Skene, and periurethral glands are without mass, cyst, or tenderness.  She exhibits no cystocele.      PELVIC US 03/2015:  The uterus measures 8.23 x 5.04 x 5.5 cm. Uterus is retroverted.    The myometrium is of slightly heterogeneous echotexture. The tip of    the IUD appears to be in the lower uterine segment. The wings are not    well seen. There is a intramural anterior uterine fibroid measuring    1.3 x 0.91 x  1.41 cm. Another intramural fibroid is noted in the    fundus measuring 1.3 x 1.35 x 1.2 cm.    Endometrial stripe measures 0.60 cm. This is normal for premenopausal    female.      The right ovary measures 3.33 x 5.42 x 3.03 cm. Small amount of free    fluid is noted in the right adnexa. There is a dominant follicle or    physiologic cyst measuring 2.89 x 2.52 x 2.33 cm.    Left ovary measures 2.79 x 4.53 x 3.17 cm. There is a dominant    follicle or physiologic cyst measuring 3.08 x 2.14 x 3.07 cm. These    most likely benign finding in a premenopausal female. Normal color    Doppler of the ovaries.      There is no free fluid in the cul de sac. Cervix is normal.        The limited visualization of the kidneys demonstrates no    hydronephrosis. There is no abscess or fluid collections in the    pelvis.      IMPRESSION:    1. The tip of the IUD appears to be in the mid to lower uterine    segment. The wings are not well seen.            ASSESSMENT AND PLAN:   This is a 39 year old female with a history of dermatomyositis seen in f/u for chronic urinary urgency and frequency.  Likely the immune process that is affecting her epidermis is also likely affecting her bladder mucosa.    Continue Elmiron  Will plan for UDS given continued symptoms  ?Botox / Mirabegron would be helpful for urgency, frequency  Continue diet low on bladder irritatants:   including acidic foods and spicy foods.    Does have hx of ovarian cysts (when large, can irritate bladder) - small on pelvic US  Fibroids also found on [place] - small, new

## 2015-04-08 ENCOUNTER — Telehealth (HOSPITAL_BASED_OUTPATIENT_CLINIC_OR_DEPARTMENT_OTHER): Payer: Self-pay | Admitting: ESP Geriatrics

## 2015-04-08 ENCOUNTER — Ambulatory Visit (HOSPITAL_BASED_OUTPATIENT_CLINIC_OR_DEPARTMENT_OTHER): Payer: PRIVATE HEALTH INSURANCE | Admitting: Ophthalmology

## 2015-04-08 MED ORDER — MYCOPHENOLATE MOFETIL 500 MG PO TABS
ORAL_TABLET | ORAL | 2 refills | Status: DC
Start: 2015-04-08 — End: 2015-05-30

## 2015-04-08 NOTE — Progress Notes (Signed)
Attempted to reach patient.  Left message for the pt to contact the clinic regarding her medication issue.    Will notify provider.

## 2015-04-08 NOTE — Progress Notes (Signed)
Error in the refill process.  Fixed it.

## 2015-04-08 NOTE — Telephone Encounter (Signed)
-----   Message from [name] sent at 04/05/2015 12:57 PM EDT -----  Regarding: [facility] pt; med issue  Contact: [phone]      Sharece Fleischhacker [id number], 39 year old, female, Telephone Information:  Home Phone      [phone]  Work Phone      Not on file.  Mobile          [phone]      Patient's Preferred Pharmacy:     [id number] - [place], [place] - [place]  Phone: [phone] Fax: [phone]      CONFIRMED TODAY: Yes    CALL BACK NUMBER: [phone]  Best time to call back: any  Cell phone:   Other phone:    Available times:    Patient's language of care: English    Patient does not need an interpreter.    Patient's PCP: Rica Mote, MD    Person calling on behalf of patient: Patient (self)    Calls today b/c she's only being given 10days worth of cellcept at a time, would like to switch back to 30 day scripts, pharmacy told her to call us for new rx

## 2015-04-08 NOTE — Progress Notes (Signed)
Pt returned phone call about her medication.  Pt stated that she wanted to inform Dr Lorane Gell that she was off her Mycophenolate (Cellcept) 500 mg for 10 days.  Pt stated that she is not sure as to why she was not given the full amount of her pills..  But today, she received a call form [facility] informing [facility] that her medication is ready for pick.  Pt was informed that her message will be sent to Dr Lorane Gell.  Pt verbalized understanding and agreed with plan.  Will notify provider.

## 2015-04-09 ENCOUNTER — Ambulatory Visit (HOSPITAL_BASED_OUTPATIENT_CLINIC_OR_DEPARTMENT_OTHER): Payer: PRIVATE HEALTH INSURANCE | Admitting: Dermatology

## 2015-04-09 DIAGNOSIS — M3390 Dermatopolymyositis, unspecified, organ involvement unspecified: Secondary | ICD-10-CM

## 2015-04-09 DIAGNOSIS — M3313 Other dermatomyositis without myopathy: Secondary | ICD-10-CM

## 2015-04-09 MED ORDER — TRIAMCINOLONE ACETONIDE 0.1 % EX CREA
TOPICAL_CREAM | Freq: Two times a day (BID) | CUTANEOUS | 1 refills | Status: AC
Start: 2015-04-09 — End: 2015-04-23

## 2015-04-09 NOTE — Progress Notes (Signed)
Kayona Foor is a very pleasant 39 year old female who presents to the dermatology clinic today for follow-up.    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  Last derm visit treatments:  Dermatomyositis  - Stable today from derm perspective  - Emphasized importance of application of SPF 30 or greater sunscreen regularly during sun exposure. Provided samples today  - She can use clobetasol 0.05% cream 1-2 times daily to affected areas of extremities and chest for relief of inflammation from sun exposure   -She is currently on plaquenil, methotrexate, cellcept with significant subjective improvement in muscular function  - She will continue close follow-up with Dr.Chapnick   -Reviewed gentle skin care measures at length with patient including taking short lukewarm showers, using a gentle moisturizing soap such as dove or cetaphil, and use of an emollient cream after bathing such as dove/cetaphil/Eucerin/CeraVe  .  RTC 6 months or earlier if needed PRN       Interim history:    Pt is pleasant 39 yo F w/ hx of dermatomyositis who presents today for f/u. Pt continues on 25 mg methotrexate weekly. Since last visit her dose of mycophenolate has decreased to 2000 mg daily. Lakaisha is off of Prednisone at this time. She has been taking plaquenil 200 mg BID. Pt reports that since her last visit, her skin has been flaring recently. She has noted that she is getting sores on her scalp. She admits to getting more sun recently.     Other dermatologic history: dermatomyositis as above    Other interim changes in patient's health: Pt denies    ROS: No recent fevers, chills, night sweats, weight loss, nausea, vomiting, diarrhea, headaches, cough, shortness of breath, arthralgias, myalgias                Past Medical History        Dermatomyositis     Shingles              Current outpatient prescriptions: fluocinonide (LIDEX) 0.05 % external solution, To scalp bid x 2 week. 2  weeks off. Can repeat., Disp: 60 mL, Rfl: 3; [facility] D3 2000 UNITS CAPS, TAKE 1 CAPSULE BY MOUTH DAILY., Disp: 30 capsule, Rfl: 5; mupirocin (BACTROBAN) 2 % ointment, APPLY TOPICALLY THREE TIMES DAILY, Disp: 22 g, Rfl: 5    clobetasol (TEMOVATE) 0.05 % external solution, Apply topically 2 (two) times daily as needed. Two weeks on, two weeks off, Disp: 50 mL, Rfl: 3; ELMIRON 100 MG capsule, TAKE ONE CAPSULE BY MOUTH 3 TIMES A DAY BEFORE MEALS, Disp: 90 capsule, Rfl: 11; mycophenolate (CELLCEPT) 500 MG tablet, 2 tabs po bid, Disp: 120 tablet, Rfl: 3; methotrexate 2.5 MG tablet, TAKE 10 TABLETS BY MOUTH ONCE A WEEK- IN DIVIDED DOSING, Disp: 40 tablet, Rfl: 3  hydroxychloroquine (PLAQUENIL) 200 MG tablet, Take 2 tablets by mouth daily., Disp: 60 tablet, Rfl: 3; Cholecalciferol (VITAMIN D) 2000 UNITS CAPS, Take 2 capsules by mouth daily., Disp: 60 capsule, Rfl: 5; ammonium lactate (LAC-HYDRIN) 12 % lotion, APPLY 15MLS TOPICALLY 2 TIMES A DAY AS NEEDED, Disp: 595 mL, Rfl: 3; folic acid (FOLVITE) 1 MG tablet, TAKE 2 TABLETS BY MOUTH DAILY, Disp: 60 tablet, Rfl: 5  Calcium Carbonate-Vitamin D 600-400 MG-UNIT TABS Tablet, TAKE ONE (1) TABLET TWICE DAILY. DO NOT TAKE WITHIN A FEW HOURS OF MYCOPHENOLATE., Disp: 60 tablet, Rfl: 11; levonorgestrel (MIRENA) 20 MCG/24HR IUD, 1 Device by Intrauterine route continuous. As Instructed, Disp: 1 Device, Rfl: 0  Review of Patient's Allergies indicates:    Cat dander Runny Nose       Physical Exam:  A focused dermatologic exam of the face, hands, scalp,distal upper extremities:  -AOx3, NAD, pleasant  Scalp with foci of alopecia, blachable pink patches with dyspigmentation. There are two superficial erosions on the R temporal scalp  - Most prominently on the periorbital skin but also on the lateral aspects of the cheek there are dyspigmented patches with intermixed hyper, hyopigmentation and telangiectasias. Today these areas have notable pink patches superimposed upon these areas of  dyspigmentation.  - On the chest, in a V distribution, as well as on the posterior neck, there are dyspigmented patches with intermixed hyper/hypopigmentation and telangiectasias Today these areas have notable pink patches superimposed upon these areas of dyspigmentation that are sharply demarcated in a photodistribution  - Bilateral upper extremities are very thin with apparent loss of the subcutaneous fat.   - The bilateral lower extremities have 2+ symmetric pitting edema   - No sclerodactyly, no distal digital pitting  - Prominent nailfold telangiectasias    A/P: Very pleasant 39 year old female seen in dermatology clinic today for f/u evaluation of:  Dermatomyositis  - Skin is flaring today in a photodistribution  - Emphasized importance of application of SPF 30 or greater sunscreen regularly during sun exposure. Provided samples today  - She can use triamcinolone 0.1% cream 1-2 times daily to affected areas for up to 14 days per month. If this combined with sun protection are not sufficient to control flares we can begin a course of prednisone. Pt is reluctant to start this today and reports that her skin symptoms are manageable so we will treat with topical steroids at this time.   -She is currently on plaquenil, methotrexate, cellcept with significant subjective improvement in muscular function  - She will continue close follow-up with Dr.Chapnick, next appt is in 2 months  -Reviewed gentle skin care measures at length with patient including taking short lukewarm showers, using a gentle moisturizing soap such as dove or cetaphil, and use of an emollient cream after bathing such as dove/cetaphil/Eucerin/CeraVe  .  RTC 3 months or earlier if needed PRN

## 2015-04-09 NOTE — Progress Notes (Signed)
Pt is safe at home

## 2015-04-18 ENCOUNTER — Ambulatory Visit (HOSPITAL_BASED_OUTPATIENT_CLINIC_OR_DEPARTMENT_OTHER): Payer: PRIVATE HEALTH INSURANCE | Admitting: Pulmonary Disease

## 2015-04-18 VITALS — BP 120/76 | HR 73 | Temp 99.4°F | Wt 140.8 lb

## 2015-04-18 DIAGNOSIS — R062 Wheezing: Secondary | ICD-10-CM | POA: Diagnosis not present

## 2015-04-18 DIAGNOSIS — R9389 Abnormal findings on diagnostic imaging of other specified body structures: Secondary | ICD-10-CM

## 2015-04-18 DIAGNOSIS — R938 Abnormal findings on diagnostic imaging of other specified body structures: Secondary | ICD-10-CM | POA: Diagnosis not present

## 2015-04-18 DIAGNOSIS — M3390 Dermatopolymyositis, unspecified, organ involvement unspecified: Secondary | ICD-10-CM

## 2015-04-18 DIAGNOSIS — Z9109 Other allergy status, other than to drugs and biological substances: Secondary | ICD-10-CM | POA: Diagnosis not present

## 2015-04-18 DIAGNOSIS — Z91048 Other nonmedicinal substance allergy status: Secondary | ICD-10-CM | POA: Diagnosis not present

## 2015-04-18 LAB — CREATININE: CREATININE: 0.5 mg/dL (ref 0.4–1.2)

## 2015-04-18 NOTE — Progress Notes (Signed)
CC: Asked by Dr. 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 PFT's showed air trapping and decreased DLCO; CT with peribronchial opacity in RUL with pulm apical scarring; mild air trapping in LLL, borderline subcarinal adenopathy    Denies SOB/DOE except mild DOE with running.  If she has a bad cold or allergy sx's may hear wheezing with some difference in her breathing (nasal >chest), but nothing that interferes with her activities or has needed medical attention. No usual cough or sputum, though sometimes has prolonged sputum with colds. Denies chest tightness. Denies nocturnal sx's of cough/SOB.    Pt has chronic nasal congestion since her teens, with related breathing problems due to this. Sometimes has post-nasal drip, never dx'd with sinusitis. Doesn't smell as well as others;    Has tried claritin, benadryl, zyrtec, zicam (oxymetazoline) which works best for her; thinks flonase may not have worked as well, but may not have taken it consistently.    ROS:  Skin can hurt; sensitive to temperature; limited mouth opening; mild dysphagia; legs, trunk, arms, +/-neck with painful muscle motion, limited ROM>weakness, has +difficulty opening things; areas of hypopigmentation, rash and skin thickening  urinary frequency and urgency (better on elmiron)  All others negative    PMH:  environmental allergies- since age 46; cats; seasonal  Food allergies- raw vegetables (broccoli, etc), nectarines,   Denies hx of asthma or pneumonia/bronchitis  Dermatomyositis- dx 2005  Diffuse scleroderma- dx ~2010  Discoid lupus of scalp  Inflammatory arthritis- denies sx's  Mild dysphagia  Anemia  Proteinuria  Focal bladder erythema on cystoscopy  Zoster on face complicated by cellulitis  Inflammatory breast lump resected from left breast years ago related to DM  ?overactive bladder    Family Hx:  Denies asthma/pulm  probs  Uncle with allergies  Son with asthma  Cousins with autoimmune disease    NKDA    Meds:  zicam nasal spray  Hydroxychloroquine 400 mg/d  MTX 25 mg weekly  Mycophenolate 1000 mg bid  Vit D 4000 units  mirena IUD  Folic acid  elmiron (bladder med)    Habits:  Tobacco: never  EtoH: rarely  Drugs: denies  Home: lives with girlfriend and grandparents; [place] pig; +carpeting throughout; no curtains; no mattress covers, but pillow is anti-allergy; some clutter in BR; GF's grandfather smokes in living room; no mold; no pests  Activity- walks; had been going to gym, but got out of habit last semester  In school in fall and spring; works in child care, ESL tutoring; looking for work; hobbies: sing, dance, Child psychotherapist, read, Estate agent, movies, museums, concerts    PE:  Wdwn thin female in no acute distress  BP 120/76  Pulse 73  Temp 99.4 F (37.4 C) (Temporal)  Wt 63.9 kg (140 lb 12.8 oz)  SpO2 100%  BMI 21.41 kg/m2  HEENT anicteric sclerae, perrl eomi; oropharynx without erythema or exudate; limited mouth opening; no oral lesions;  normal dentition; Mallampati 4 airway  Neck: supple, no bruits, no thyromegaly, no tracheal deviation  Heart: regular S1S2, no S3S4 or murmurs  Lungs: no accessory muscle use; no rales or wheezes;good breath sounds;   Abdomen: soft nontender, nondistended;  Lymphatic: no cervical, supraclavicular  adenopathy  Extremities: no clubbing, cyanosis; +lymphedema, nonpitting  Skin: hypopigmented areas in periorbital areas, hands/arms  Musculoskeletal: Normal gait     Data:  PFT's 10/31/14  personally reviewed :  FEV1 81%  FEV1/FVC ratio 85%; FEF 25-75% 74%  (-)Bronchodilator responsiveness  Lung volumes normal to >predicted (TLC 107%, RV 133%, VC 95%, FRC 132%)  DLCO 100  Oximetry 97%->94% at 500 ft  Impression: minimal decrease in flows at low lung volumes; no restriction, normal diffusion; mild drop in sats with exertion    Date FEV1 % FVC % FEF25-75 ratio BD TLC RV VC DLCO % O2sat exerc   08/03/13  3.24 100 3.82 93 78->108% 85 + 5.48/93 1.61/83 3.87/94 113/121 100 100   10/31/14 2.88 81 3.70 86 63->74 85 - 6.30/107 2.60/133 3.70/95 100/99 97 94       Chest CT 10/2014 personally reviewed : minimal linear areas in RUL; +borderline lymphadenopathy in subcarinal and right hilar area    10/29/2003 chest CT+ report [facility]: linear opacity left lung base posteriorly c/w atelectasis or scarring; no adenopathy;     ECHO 01/2014: LVEF 55%   RVSP 23      Imp:  39 yo female with multiple autoimmune disorders, including dermatomyositis, diffuse scleroderma, and discoid lupus; also hx of environmental allergies since childhood and chronic nasal congestion.  Pt may have mild intermittent asthma, allergic-possible sx's of wheezing with uri's or allergic triggers; may have had bronchodilator response on prior PFT, and has possible air trapping on recent chest CT suggesting obstructive dysfunction.  No evidence for interstitial lung disease nor suggestion for pulm vascular disease based on PFT's, though at risk for this. Borderline adenopathy may reflect reactive nodes, but at increased risk for lung cancer with scleroderma; has only discoid lupus, but SLE associated with lymphoma.    Chronic rhinitis, ?allergic vs perennial    Rec:  Methacholine challenge testing to confirm dx of asthma  rx of rhinitis; trial of flonase; should d/c zicam/oxymetazoline as this could lead to rebound congestion  Chest CT in 6 months to f/u adenopathy and RUL abnormality

## 2015-04-18 NOTE — Progress Notes (Signed)
Pt referred by rheumatologist due to abnormal PFT results.  Results are different from previous tests.  ? Asthma.  Pt states having sinus problems and allergies but does not have any issues with lungs. Pt does have issues with stairs but unsure if it is due to existing condition, deconditioning or lungs.    Pt feels safe at home.

## 2015-04-23 ENCOUNTER — Other Ambulatory Visit (HOSPITAL_BASED_OUTPATIENT_CLINIC_OR_DEPARTMENT_OTHER): Payer: Self-pay | Admitting: Internal Medicine

## 2015-04-23 NOTE — Telephone Encounter (Signed)
-----   Message from Gackle sent at 04/23/2015 12:27 PM EDT -----  Regarding: chapick pt rx refill  Contact: [phone]       ammonium lactate (LAC-HYDRIN) 12 % lotion         [facility]    Person calling on behalf of patient: Patient (self)    May list multiple medications in this section    Medicine Name:  ammonium lactate (LAC-HYDRIN) 12 % lotion             ammonium lactate (LAC-HYDRIN) 12 % lotion             Dosage:        ammonium lactate (LAC-HYDRIN) 12 % lotion             Frequency (how many pills, how many times a day): __    Number of pills left: ?    Documented patient preferred pharmacies:   [id number] [name], [place] - [address]  Phone: [phone] Fax: [phone]      CALL BACK NUMBER:[phone]    Patient's language of care: English    Patient needs a [place] interpreter.

## 2015-04-29 ENCOUNTER — Ambulatory Visit (HOSPITAL_BASED_OUTPATIENT_CLINIC_OR_DEPARTMENT_OTHER): Payer: Self-pay | Admitting: Pulmonary Disease

## 2015-04-29 DIAGNOSIS — R918 Other nonspecific abnormal finding of lung field: Secondary | ICD-10-CM | POA: Diagnosis not present

## 2015-04-29 DIAGNOSIS — M3390 Dermatopolymyositis, unspecified, organ involvement unspecified: Secondary | ICD-10-CM

## 2015-04-29 LAB — CT CHEST W CONTRAST

## 2015-04-29 MED ORDER — NORMAL SALINE FLUSH 0.9 % IV SOLN
60.00 mL | Freq: Once | INTRAVENOUS | Status: AC
Start: 2015-04-29 — End: 2015-04-29
  Administered 2015-04-29: 60 mL via INTRAVENOUS

## 2015-04-29 MED ORDER — IOHEXOL 350 MG/ML IV SOLN
50.00 mL | Freq: Once | INTRAVENOUS | Status: AC
Start: 2015-04-29 — End: 2015-04-29
  Administered 2015-04-29: 50 mL via INTRAVENOUS

## 2015-04-29 NOTE — Addendum Note (Signed)
Addended by: Jilda Roche on: 04/29/2015 11:27 AM     Modules accepted: Orders

## 2015-04-29 NOTE — Addendum Note (Signed)
Addended by: Britt Boozer on: 04/29/2015 11:52 AM     Modules accepted: Orders, SmartSet

## 2015-05-02 DIAGNOSIS — F4323 Adjustment disorder with mixed anxiety and depressed mood: Secondary | ICD-10-CM | POA: Diagnosis not present

## 2015-05-03 ENCOUNTER — Telehealth (HOSPITAL_BASED_OUTPATIENT_CLINIC_OR_DEPARTMENT_OTHER): Payer: Self-pay | Admitting: Pulmonary Disease

## 2015-05-03 NOTE — Progress Notes (Signed)
Chest CT 04/29/15 personally reviewed    Examinations compared toa chest CT scan of    10/31/2014.      Examination again shows some chronic scarring at both lung apices.    Some minimal groundglass infiltrate changes persist in the right upper    lobe. A prominent right hilar lymph node persist measuring 10.2 mm    today. This measured approximately 10 mm previously and is stable. No    evidence of abnormal mediastinal lymphadenopathy. There is nodes of    cardiac enlargement or pericardial effusion. The thoracic aorta    appears unremarkable. The trachea and major bronchi are patent. The    superior abdomen as imaged is unremarkable. There are no acute bony    findings.      IMPRESSION: Findings as noted above are stable since previous    examination of 10/31/2014. A prominent right hilar lymph node persist    with some minimal groundglass infiltrative changes in the right upper    lobe.    2. There is persistence of some chronic scarring at both apices.      6 month f/u chest CT suggests changes c/w scarring. Official report states no abnormal adenopathy.  Will discuss with radiologist, but likely no need for further f/u.    Will review this with pt.   Left voicemail that i called to review results of CT and will try to call her back after weekend.

## 2015-05-08 ENCOUNTER — Telehealth (HOSPITAL_BASED_OUTPATIENT_CLINIC_OR_DEPARTMENT_OTHER): Payer: Self-pay | Admitting: Pulmonary Disease

## 2015-05-08 DIAGNOSIS — F4323 Adjustment disorder with mixed anxiety and depressed mood: Secondary | ICD-10-CM | POA: Diagnosis not present

## 2015-05-08 NOTE — Progress Notes (Signed)
Called to review CT results, but no answer.   Would like to tell her that CT showed changes consistent with scarring and no abnormal adenopathy. Likely no furhter f/u required, but would like to review personally with radiologist as well.  Left message that I would try her next week.

## 2015-05-09 ENCOUNTER — Ambulatory Visit (HOSPITAL_BASED_OUTPATIENT_CLINIC_OR_DEPARTMENT_OTHER): Payer: PRIVATE HEALTH INSURANCE | Admitting: Ophthalmology

## 2015-05-09 DIAGNOSIS — M349 Systemic sclerosis, unspecified: Secondary | ICD-10-CM

## 2015-05-09 DIAGNOSIS — Z79899 Other long term (current) drug therapy: Secondary | ICD-10-CM

## 2015-05-09 NOTE — Nursing Note (Signed)
Here for follow up Long -term use of Plaquenil Eye exam.    Pts continue taking Plaquenil 200 mg, once a day.    Pts denies any ocular changes since last visit.    Vision stable OU.    No pain or discomforts.    No Flashes or floaters.        Other Diagnosis:      Scleroderma       Myopia with astigmatism, bilateral       Polypharmacy       Myopia of both eyes       Astigmatism of both eyes       Myopia

## 2015-05-09 NOTE — Progress Notes (Signed)
.  Julie Freeman had an eye exam because she takes plaquenil/hydroxychloroquine .    The dilated fundus exam did not reveal any maculopathy.     New revised recommendations on screening for plaquenil maculopathy suggest that after an initial baseline exam patients taking plaquenil should have annual screening starting after 5 years.    New data have shown that the risk of toxicity increases sharply towards 1% after 5-7 years of use or after a cumulative dose of 1000g.    The patient should be monitored with automated visual field testing and macula OCT yearly after screening is initiated.    she was also instructed to return to the clinic immediately and to stop taking Plaquenil if any abnormalities in the vision develop.    Her recent macula OCT and visual field tests were wnl.

## 2015-05-10 ENCOUNTER — Encounter (HOSPITAL_BASED_OUTPATIENT_CLINIC_OR_DEPARTMENT_OTHER): Payer: Self-pay | Admitting: Geriatric Medicine

## 2015-05-10 DIAGNOSIS — H539 Unspecified visual disturbance: Secondary | ICD-10-CM | POA: Insufficient documentation

## 2015-05-15 ENCOUNTER — Ambulatory Visit (HOSPITAL_BASED_OUTPATIENT_CLINIC_OR_DEPARTMENT_OTHER): Payer: Self-pay | Admitting: Pulmonary Disease

## 2015-05-15 DIAGNOSIS — R942 Abnormal results of pulmonary function studies: Secondary | ICD-10-CM | POA: Diagnosis not present

## 2015-05-15 MED ORDER — IPRATROPIUM-ALBUTEROL 0.5-2.5 (3) MG/3ML IN SOLN
3.0000 mL | Freq: Once | RESPIRATORY_TRACT | Status: AC | PRN
Start: 2015-05-15 — End: 2015-05-15

## 2015-05-15 MED ORDER — METHACHOLINE 0.025 MG/ML INHALATION SOLUTION
0.0250 mg | Freq: Once | RESPIRATORY_TRACT | Status: AC
Start: 2015-05-15 — End: 2015-05-15
  Administered 2015-05-15: 0.025 mg via RESPIRATORY_TRACT

## 2015-05-15 MED ORDER — METHACHOLINE 0.25 MG/ML INHALATION SOLUTION
0.2500 mg | Freq: Once | RESPIRATORY_TRACT | Status: AC | PRN
Start: 2015-05-15 — End: 2015-05-15

## 2015-05-15 NOTE — Addendum Note (Signed)
Addended by: Laurey Arrow on: 05/15/2015 03:50 PM     Modules accepted: Orders, SmartSet

## 2015-05-16 DIAGNOSIS — F4323 Adjustment disorder with mixed anxiety and depressed mood: Secondary | ICD-10-CM | POA: Diagnosis not present

## 2015-05-16 NOTE — Procedures (Addendum)
Date of Service: 05/15/2015    ORDERING PHYSICIAN:  Dr. Jordan Hawks.    INDICATION:  Environmental allergies.    For full details of test, see scanned media section of Epic.    Patient had a prior set of PFTs done in January of this year, which showed an FEV1 to FVC of 78%, FEV1 of 2.88 or 81%, FVC of 3.70 or 86%, with no response to bronchodilator.  Lung volumes had shown evidence of air trapping with an elevated RV to TLC at 41%.  Diffusion was intact.  Ambulatory sats dropped from 97% to 94%.    [facility] REPORT    Patient was tested using a provocholine challenge test.  Pretesting, her FEV1 was 2.69 or 89% of predicted.  Her FVC was 3.39 or 92% of predicted.  With a level 2 challenge of 0.250, her FEV1 dropped to 1.21 or 40% of predicted for a 55% change, and her FVC dropped 2.34 or 64% of predicted for a 31% drop.  Flows at mid to low lung volumes dropped from 72% to 12% of predicted for an 83% drop.  Peak flow dropped from 6.96 to 3.97 for a 43% drop.    PC20 for the FEV1 was 0.026.    Pre- and post-test room air saturations were 99% and 98% respectively, and heart rate was the same at 95.    IMPRESSION:  This constitutes a positive methacholine challenge test.  Oximetry is normal pre- and post-test.    ___________________________  Reviewed and Electronically Signed By: Serita Butcher MD  Sig Date: 05/16/2015  Sig Time: 14:36:54  Dictated By: Serita Butcher MD  Dict Date: 05/16/2015 Dict Time: 11 48 AM    Dictation Date and Time:05/16/2015 11:48:45  Transcription Date and Time:05/16/2015 11:58:12  eScription Dictation id: [id number] Confirmation # :[id number]

## 2015-05-20 ENCOUNTER — Other Ambulatory Visit (HOSPITAL_BASED_OUTPATIENT_CLINIC_OR_DEPARTMENT_OTHER): Payer: Self-pay | Admitting: Internal Medicine

## 2015-05-22 ENCOUNTER — Other Ambulatory Visit (HOSPITAL_BASED_OUTPATIENT_CLINIC_OR_DEPARTMENT_OTHER): Payer: Self-pay | Admitting: Internal Medicine

## 2015-05-22 NOTE — Progress Notes (Signed)
F/U appointment 05/30/15

## 2015-05-30 ENCOUNTER — Ambulatory Visit (HOSPITAL_BASED_OUTPATIENT_CLINIC_OR_DEPARTMENT_OTHER): Payer: PRIVATE HEALTH INSURANCE | Admitting: Internal Medicine

## 2015-05-30 ENCOUNTER — Encounter (HOSPITAL_BASED_OUTPATIENT_CLINIC_OR_DEPARTMENT_OTHER): Payer: Self-pay | Admitting: Internal Medicine

## 2015-05-30 ENCOUNTER — Telehealth (HOSPITAL_BASED_OUTPATIENT_CLINIC_OR_DEPARTMENT_OTHER): Payer: Self-pay | Admitting: Pulmonary Disease

## 2015-05-30 VITALS — BP 136/87 | HR 73 | Wt 144.0 lb

## 2015-05-30 DIAGNOSIS — Z5181 Encounter for therapeutic drug level monitoring: Secondary | ICD-10-CM | POA: Diagnosis not present

## 2015-05-30 DIAGNOSIS — R918 Other nonspecific abnormal finding of lung field: Secondary | ICD-10-CM

## 2015-05-30 DIAGNOSIS — M349 Systemic sclerosis, unspecified: Secondary | ICD-10-CM | POA: Diagnosis not present

## 2015-05-30 DIAGNOSIS — J4520 Mild intermittent asthma, uncomplicated: Secondary | ICD-10-CM | POA: Insufficient documentation

## 2015-05-30 DIAGNOSIS — M3313 Other dermatomyositis without myopathy: Secondary | ICD-10-CM

## 2015-05-30 DIAGNOSIS — M3390 Dermatopolymyositis, unspecified, organ involvement unspecified: Secondary | ICD-10-CM

## 2015-05-30 LAB — HEPATIC FUNCTION PANEL
ALANINE AMINOTRANSFERASE: 22 U/L (ref 12–45)
ALBUMIN: 4.3 g/dL (ref 3.4–5.0)
ALKALINE PHOSPHATASE: 34 U/L — ABNORMAL LOW (ref 45–117)
ASPARTATE AMINOTRANSFERASE: 21 U/L (ref 8–34)
BILIRUBIN DIRECT: 0.2 mg/dl (ref 0.0–0.2)
BILIRUBIN TOTAL: 0.8 mg/dL (ref 0.2–1.0)
INDIRECT BILIRUBIN: 0.6 mg/dL (ref 0.2–0.9)
TOTAL PROTEIN: 7.9 g/dL (ref 6.4–8.2)

## 2015-05-30 LAB — CBC, PLATELET & DIFFERENTIAL
ABSOLUTE BASO COUNT: 0 10*3/uL (ref 0.0–0.1)
ABSOLUTE EOSINOPHIL COUNT: 0.3 10*3/uL (ref 0.0–0.8)
ABSOLUTE IMM GRAN COUNT: 0.01 10*3/uL (ref 0.00–0.03)
ABSOLUTE LYMPH COUNT: 0.9 10*3/uL (ref 0.6–5.9)
ABSOLUTE MONO COUNT: 0.7 10*3/uL (ref 0.2–1.4)
ABSOLUTE NEUTROPHIL COUNT: 2.8 10*3/uL (ref 1.6–8.3)
BASOPHIL %: 0.6 % (ref 0.0–1.2)
EOSINOPHIL %: 5.5 % (ref 0.0–7.0)
HEMATOCRIT: 35.2 % (ref 34.1–44.9)
HEMOGLOBIN: 11.9 g/dL (ref 11.2–15.7)
IMMATURE GRANULOCYTE %: 0.2 % (ref 0.0–0.4)
LYMPHOCYTE %: 19.8 % (ref 15.0–54.0)
MEAN CORP HGB CONC: 33.8 g/dL (ref 31.0–37.0)
MEAN CORPUSCULAR HGB: 33.5 pg (ref 26.0–34.0)
MEAN CORPUSCULAR VOL: 99.2 fL (ref 80.0–100.0)
MEAN PLATELET VOLUME: 10 fL (ref 8.7–12.5)
MONOCYTE %: 14.5 % — ABNORMAL HIGH (ref 4.0–13.0)
NEUTROPHIL %: 59.4 % (ref 40.0–75.0)
PLATELET COUNT: 235 10*3/uL (ref 150–400)
RBC DISTRIBUTION WIDTH STD DEV: 45.8 fL (ref 35.1–46.3)
RBC DISTRIBUTION WIDTH: 12.7 % (ref 11.5–14.3)
RED BLOOD CELL COUNT: 3.55 M/uL — ABNORMAL LOW (ref 3.90–5.20)
WHITE BLOOD CELL COUNT: 4.7 10*3/uL (ref 4.0–11.0)

## 2015-05-30 LAB — CREATINE KINASE TOTAL: CREATINE KINASE TOTAL: 205 IU/L (ref 26–192)

## 2015-05-30 LAB — CREATININE WITH GFR
CREATININE: 0.4 mg/dL (ref 0.4–1.2)
ESTIMATED GLOMERULAR FILT RATE: >60 mL/min (ref >60)

## 2015-05-30 MED ORDER — FOLIC ACID 1 MG PO TABS
2000.0000 ug | ORAL_TABLET | Freq: Every day | ORAL | 2 refills | Status: DC
Start: 2015-05-30 — End: 2015-09-18

## 2015-05-30 MED ORDER — CALCIUM CARBONATE-VITAMIN D 600-400 MG-UNIT PO TABS
ORAL_TABLET | ORAL | 2 refills | Status: DC
Start: 2015-05-30 — End: 2015-10-31

## 2015-05-30 MED ORDER — HYDROXYCHLOROQUINE SULFATE 200 MG PO TABS
ORAL_TABLET | ORAL | 2 refills | Status: DC
Start: 2015-05-30 — End: 2015-08-29

## 2015-05-30 MED ORDER — ALBUTEROL SULFATE HFA 108 (90 BASE) MCG/ACT IN AERS
2.0000 | INHALATION_SPRAY | RESPIRATORY_TRACT | 1 refills | Status: DC | PRN
Start: 2015-05-30 — End: 2016-02-20

## 2015-05-30 MED ORDER — METHOTREXATE 2.5 MG PO TABS
ORAL_TABLET | ORAL | 2 refills | Status: DC
Start: 2015-05-30 — End: 2015-08-29

## 2015-05-30 NOTE — Progress Notes (Signed)
PFTs- + metacholine challenge      The primary progress note for this visit has been dictated through E-Scription. It can be viewed as an attachment to this encounter or through Chart Review under the NOTE Tab as an Rheumatology Office Note.      Review of Systems: Constitutional, Eyes, ENT/Mouth, Cardiovascular, Respiratory, GI, GU, Neuro, Psych, Heme/Lymph, Skin, Musculoskeletal, and Endocrine systems were reviewed and are NEGATIVE, except for what is dictated in the note.

## 2015-05-30 NOTE — Progress Notes (Signed)
Will send a letter re: results of chest CT and methacholine challenge testing.    Discussed chest CT with Dr. Alger Simons of radiology. He is not concerned about malignancy with prominent node, but thinks a f/u in 1 year (which will be 1 1/2 yrs of f/u) will be reasonable.    Pt should have an albuterol inhaler, with PFT's and hx c/w mild intermittent asthma. Will prescribe that, but will need instruction in technique.

## 2015-05-30 NOTE — Patient Instructions (Signed)
Methotrexate   Decrease to 9 pills for 8 weeks= 22.5mg   Decrease to 8 pills= 20mg 

## 2015-05-30 NOTE — Progress Notes (Signed)
Date of Service: 05/30/2015    HISTORY OF PRESENT ILLNESS:  This is a follow-up visit for this 39 year old woman with dermatomyositis and diffuse cutaneous systemic sclerosis.  She does not remember having a history of discoid lupus and discoid lesions on her scalp, but there is report in her records.  There is no evidence of systemic lupus erythematosus (ANA negative).  She has a history of erythema nodosum.  She has a history of inflammatory mass in the breast and focal bladder erythema thought to be related to dermatomyositis.  PFTs showed air trapping and decreased DLCO.  CT scan showed peribronchial opacification and apical scarring.  Recent CT scan of the chest was stable.  Recent PFTs suggested asthma.    The patient wears sunscreen, but she has been out in the sun.  Her skin is tanned and the areas of vitiligo are pink.  She feels well overall.    She denies fevers, unintentional weight loss, chest pain, dyspnea, cough, diarrhea, hematochezia, dysuria, hematuria, new rash, photosensitivity, Raynaud's, joint pain, joint swelling, weakness, paresthesias.  She had 1 fall during a day many months ago when she was feeling very fatigued.    PAST MEDICAL HISTORY:  Zoster on the face, complicated by cellulitis  Inflammatory breast lump  Proteinuria  Seasonal allergies  Anemia  Erythema nodosum  Bladder erythema  History of discoid lupus  Decreased DLCO    ALLERGIES AND ADVERSE REACTIONS:  No known drug allergies.    CURRENT MEDICATIONS:    Calcium  Vitamin D  Elmiron  Folic acid  Hydroxychloroquine  Ibuprofen  Mirena IUD  Methotrexate 25 mg weekly  Mycophenolate 1000 mg twice a day    PHYSICAL EXAMINATION:  VITAL SIGNS:  Weight 144 pounds, SpO2 of 100%, blood pressure 136/87, pulse 73, pain 0/10.  GENERAL:  She is in no acute distress.  She is alert and oriented.  I did not take off her wig.    HEENT:  Oropharynx clear.  Face with areas of pink discoloration, hyperpigmentation with tanning.    NECK:  Supple, no  lymphadenopathy.  PULMONARY:  Lungs are clear to auscultation without wheezes, rhonchi, or rales.  EXTREMITIES:  Warm, well perfused.  There is no edema, no clubbing.  SKIN:  Sclerodermatous changes in the forearms, upper arms, and chest, areas of hyperpigmentation and hypopigmentation on the arms and chest, fingers with areas of atrophy, erythema and blotchy discoloration, no digital pits.    MUSCULOSKELETAL:  Low lean body mass in the upper extremities.  No synovitis appreciated.    LABORATORY DATA:  Creatinine 0.5  CRP less than 0.3  CK 239  C3 of 96, C4 of 17  Anticentromere antibody, SCL 70, and ANA negative  WBC 4; hematocrit 34; platelets 219,000  ESR 17  Urinalysis without hematuria or proteinuria  ANCA negative  Anti Jo 1 antibody negative  CK 239    ASSESSMENT:  A 39 year old woman with diffuse cutaneous systemic sclerosis and dermatomyositis and history of discoid lesions.      PLAN, RECOMMENDATIONS:   1.  I would like for her to try to decrease her methotrexate to 22.5 mg for 8 weeks.  If she does well, after a week, she will decrease to 20 mg.  2.  Stay on mycophenolate 2000 mg twice a day.  3.  Continue hydroxychloroquine.  4.  Sunscreen.  5.  She will follow-up with dermatology for outbreaks on her scalp.  6.  I will see her back in 3 months, earlier  if needed.  7.  Labs today.     The patient verbalized an understanding and agreement with this plan.    ___________________________  Reviewed and Electronically Signed By: Coolidge Breeze MD  Sig Date: 09/12/2015  Sig Time: 18:01:36  Dictated By: Coolidge Breeze MD  Dict Date: 05/30/2015 Dict Time: 04 00 PM    Dictation Date and Time:05/30/2015 16:00:29  Transcription Date and Time:05/30/2015 17:03:35  eScription Dictation id: [id number] Confirmation # :[id number]      cc: Royal Hawthorn MD

## 2015-06-18 ENCOUNTER — Encounter (HOSPITAL_BASED_OUTPATIENT_CLINIC_OR_DEPARTMENT_OTHER): Payer: Self-pay | Admitting: Internal Medicine

## 2015-06-19 ENCOUNTER — Ambulatory Visit (HOSPITAL_BASED_OUTPATIENT_CLINIC_OR_DEPARTMENT_OTHER): Payer: PRIVATE HEALTH INSURANCE | Admitting: Pulmonary Disease

## 2015-06-19 ENCOUNTER — Encounter (HOSPITAL_BASED_OUTPATIENT_CLINIC_OR_DEPARTMENT_OTHER): Payer: Self-pay | Admitting: Pulmonary Disease

## 2015-06-19 VITALS — BP 108/70 | HR 99 | Temp 99.2°F | Wt 139.0 lb

## 2015-06-19 DIAGNOSIS — J4520 Mild intermittent asthma, uncomplicated: Secondary | ICD-10-CM

## 2015-06-19 MED ORDER — FLUTICASONE PROPIONATE 50 MCG/ACT NA SUSP
2.0000 | Freq: Every day | NASAL | 11 refills | Status: DC
Start: 2015-06-19 — End: 2016-08-05

## 2015-06-19 NOTE — Progress Notes (Signed)
Patient arrives for f/u with Dr. Jordan Hawks after PFT. Patient reporting she is feeling relatively well, was given a rescue inhaler a few weeks ago for which she only needed once. Patient reports her breathing on average is normal except when she encounters allergens. Patient reports a variety of food allergies, suspects that she is allergic to many more environmental dusts, pollens, danders but was never diagnosed. States she has tingling in her mouth and lips when eating raw vegetables such as carrots, cucumbers, celery, broccoli, cauliflower but does okay with leafy greens. Also reports same symptoms with many fruits. Patient reports she feels safe at home.

## 2015-06-19 NOTE — Patient Instructions (Signed)
Use your albuterol inhaler (ProAir, or Proventil, or Ventolin) as needed for symptoms of shortness of breath.  You can also use it before activities which tend to make you short of breath.  Use it with a spacing tube when you can.  Take one puff, hold your breath for 10 seconds, then take a second puff.  You can repeat this up to every 4 hours.    If you find that you are needing to use your albuterol more than twice a week, we may need to change your regimen.    Try using the flonase 2 sprays daily in each nostril "nose to the toes", using opposite hands. Once symptoms are controlled, you can try decreasing to 1 spray daily each nostril.    You should have a follow up CT scan in July 2017.

## 2015-06-19 NOTE — Progress Notes (Signed)
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 Lorane Gell  Abnormal chest CT: focal scarring, mild air trapping, +/-adenopathy     HPI: Did not start trial of flonase; continues on zicam/oxymetazoline for nasal congestion.  Had methacholine challenge, which was positive. Prescribed albuterol inhaler in August.    Mainly c/o DOE with running, but feels sx's are related to painful exertion due to dermatomyositis. Also feels she is deconditioned. Has had wheeze and chest tightness with allergies or URI's.     Couple weeks ago had an episode of wheeze which she treated with inhaler which resolved.     ROS:  Nasal congestion, seasonal allergies, cold intolerance, Skin can hurt; legs, trunk, arms, +/-neck with painful muscle motion, limited ROM>weakness, has +difficulty opening things; areas of hypopigmentation, rash and skin thickening    PMH:  environmental allergies- since age 55; cats; seasonal  Food allergies- raw vegetables (broccoli, etc), nectarines,   Denies hx of asthma or pneumonia/bronchitis  Dermatomyositis- dx 2005  Diffuse scleroderma- dx ~2010  Discoid lupus of scalp  Inflammatory arthritis- denies sx's  Mild dysphagia  Anemia  Proteinuria  Focal bladder erythema on cystoscopy  Zoster on face complicated by cellulitis  Inflammatory breast lump resected from left breast years ago related to DM  ?overactive bladder    Family Hx:  Denies asthma/pulm probs  Uncle with allergies  Son with asthma  Cousins with autoimmune disease    NKDA    Pulmonary Meds:  Albuterol MDI prn    Meds: no change  zicam nasal spray- takes prn only  Hydroxychloroquine 400 mg/d  MTX 25 mg weekly  Mycophenolate 1000 mg bid  Vit D 4000 units  mirena IUD  Folic acid  elmiron (bladder med)    Habits:  Tobacco: never  EtoH: rarely  Drugs: denies  Home:  lives with girlfriend and grandparents; [place] pig; +carpeting throughout; no curtains; no mattress covers, but pillow is anti-allergy; some clutter in BR; GF's grandfather smokes in living room; no mold; no pests  Activity- walks; had been going to gym  Programmer, systems in psychology; 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, ESL tutoring; hobbies: sing, dance, Child psychotherapist, read, Estate agent, movies, museums, concerts    PE:  Wdwn thin female in no acute distress; some difficulty getting on/off exam table  BP 108/70  Pulse 99  Temp 99.2 F (37.3 C) (Temporal)  Wt 63 kg (139 lb)  LMP 06/12/2015 (Approximate)  SpO2 100%  BMI 21.13 kg/m2  HEENT anicteric sclerae, perrl eomi;  limited mouth opening;  Heart: regular S1S2, no S3S4 or murmurs  Lungs: no accessory muscle use; no rales or wheezes;good breath sounds;   Skin: multiple hypopigmented areas in periorbital areas, hands/arms    Data:  PFT's 10/31/14  personally reviewed :  Impression: minimal decrease in flows at low lung volumes; no restriction, normal diffusion; mild drop in sats with exertion  Methacholine challenge 03/2015 PC20 0.26    Date FEV1 % FVC % FEF25-75 ratio BD TLC RV VC DLCO % O2sat exerc   08/03/13 3.24 100 3.82 93 78->108% 85 + 5.48/93 1.61/83 3.87/94 113/121 100 100   10/31/14 2.88 81 3.70 86 63->74 85 - 6.30/107 2.60/133 3.70/95 100/99 97 94   05/15/15 +methacholine                   Chest CT 10/2014 personally reviewed :  minimal linear areas in RUL; +borderline lymphadenopathy in subcarinal and right hilar area    Chest CT 04/29/15 personally reviewed  IMPRESSION: Findings as noted above are stable since previous examination of 10/31/2014. A prominent right hilar lymph node persist with some minimal groundglass infiltrative changes in the right upper lobe.    2. There is persistence of some chronic scarring at both apices.      10/29/2003 chest CT+ report [facility]: linear opacity left lung base posteriorly c/w atelectasis or scarring; no adenopathy;     ECHO 01/2014: LVEF  55%   RVSP 23      Imp:  39 yo female with multiple autoimmune disorders, including dermatomyositis, diffuse scleroderma, and discoid lupus; also hx of environmental allergies since childhood and chronic nasal congestion.  Pt with mild intermittent asthma, allergic-likely present for many years in retrospect per pt.     Chronic rhinitis, ?allergic vs perennial; has not yet tried inhaled nasal steroid.    No evidence for interstitial lung disease/pulm HTN per PFT's/CT/ECHO. Borderline adenopathy may be reactive.    Rec:  Discussed chest CT with Dr. Alger Simons of radiology. He is not concerned about malignancy with prominent node, but thinks a f/u in 1 year (which will be 1 1/2 yrs of f/u) will be reasonable.    Advised trial of flonase; can taper down from 2 to 1 spray daily if working  Can consider peak flow monitoring in future to more closely monitor asthma  Discussed dx of asthma  Albuterol MDI prn; instructed in use and will give spacer with RN instruction; to call if needing it >2/wk  Chest CT in 1 year to f/u adenopathy and RUL abnormality- will f/u with Dr. Lorane Gell to schedule and review  Return prn    I have spent 40 minutes in face to face time with this patient/patient proxy of which > 50% was in counseling or coordination of care regarding above issues/Dx.

## 2015-06-21 ENCOUNTER — Other Ambulatory Visit (HOSPITAL_BASED_OUTPATIENT_CLINIC_OR_DEPARTMENT_OTHER): Payer: Self-pay | Admitting: ESP Geriatrics

## 2015-06-21 NOTE — Telephone Encounter (Signed)
-----   Message from [place] sent at 06/21/2015  1:08 PM EDT -----  Regarding: RXR refill  [facility]    Person calling on behalf of patient: Patient (self)    Medicine Name: methotrexate 2.5 MG tablet  Medicine Name:Calcium Carbonate-Vitamin D 600-400 MG-UNIT TABS Tablet    Documented patient preferred pharmacies:   [id number] [place] [place] [facility] [place]  Phone: [phone] Fax: [phone]

## 2015-06-21 NOTE — Progress Notes (Signed)
Spoke with pt who was informed that, per [facility], her insurance will not pay for her medications until October 1st, 2016.  Pt stated that she was able to get her last scripts on Friday, 06/21/15 and is aware that she will be able to get her medications in October.  Pt verbalized understanding and agreed with plan.

## 2015-06-25 NOTE — Progress Notes (Signed)
Patient just had these medications refilled on 05/30/15 with 2 additional refills each.

## 2015-07-17 ENCOUNTER — Other Ambulatory Visit (HOSPITAL_BASED_OUTPATIENT_CLINIC_OR_DEPARTMENT_OTHER): Payer: Self-pay | Admitting: Internal Medicine

## 2015-07-22 ENCOUNTER — Other Ambulatory Visit (HOSPITAL_BASED_OUTPATIENT_CLINIC_OR_DEPARTMENT_OTHER): Payer: Self-pay | Admitting: Internal Medicine

## 2015-07-23 ENCOUNTER — Other Ambulatory Visit (HOSPITAL_BASED_OUTPATIENT_CLINIC_OR_DEPARTMENT_OTHER): Payer: Self-pay | Admitting: Registered Nurse

## 2015-07-23 ENCOUNTER — Encounter (HOSPITAL_BASED_OUTPATIENT_CLINIC_OR_DEPARTMENT_OTHER): Payer: Self-pay | Admitting: Registered Nurse

## 2015-07-23 NOTE — Progress Notes (Signed)
Refill for Cellcept forwarded to provider. Voicemail left for patient

## 2015-07-24 NOTE — Progress Notes (Signed)
erro  neous encounter

## 2015-07-25 MED ORDER — MYCOPHENOLATE MOFETIL 500 MG PO TABS
ORAL_TABLET | ORAL | 1 refills | Status: DC
Start: 2015-07-25 — End: 2015-08-29

## 2015-07-29 ENCOUNTER — Other Ambulatory Visit (HOSPITAL_BASED_OUTPATIENT_CLINIC_OR_DEPARTMENT_OTHER): Payer: Self-pay | Admitting: Licensed Practical Nurse

## 2015-07-29 MED ORDER — AMMONIUM LACTATE 12 % EX LOTN
TOPICAL_LOTION | CUTANEOUS | 2 refills | Status: DC
Start: 2015-07-29 — End: 2016-02-06

## 2015-07-29 NOTE — Telephone Encounter (Signed)
-----   Message from [name] sent at 07/29/2015 10:44 AM EDT -----  Regarding: [facility] pt; rxr  Julie Freeman is a 39 year old female calling to request a refill of ammonium lactate (LAC-HYDRIN) 12 % lotion.      Documented patient preferred pharmacies:    [id number] [name], [place] - [place]  Phone: [phone] Fax: [phone]

## 2015-08-13 ENCOUNTER — Encounter (HOSPITAL_BASED_OUTPATIENT_CLINIC_OR_DEPARTMENT_OTHER): Payer: Self-pay | Admitting: Geriatric Medicine

## 2015-08-13 DIAGNOSIS — L930 Discoid lupus erythematosus: Secondary | ICD-10-CM | POA: Insufficient documentation

## 2015-08-29 ENCOUNTER — Ambulatory Visit (HOSPITAL_BASED_OUTPATIENT_CLINIC_OR_DEPARTMENT_OTHER): Payer: PRIVATE HEALTH INSURANCE | Admitting: Primary Care

## 2015-08-29 ENCOUNTER — Ambulatory Visit (HOSPITAL_BASED_OUTPATIENT_CLINIC_OR_DEPARTMENT_OTHER): Payer: PRIVATE HEALTH INSURANCE | Admitting: Internal Medicine

## 2015-08-29 VITALS — BP 137/95 | HR 75 | Wt 145.0 lb

## 2015-08-29 DIAGNOSIS — M3313 Other dermatomyositis without myopathy: Secondary | ICD-10-CM

## 2015-08-29 DIAGNOSIS — Z23 Encounter for immunization: Secondary | ICD-10-CM

## 2015-08-29 DIAGNOSIS — M3390 Dermatopolymyositis, unspecified, organ involvement unspecified: Secondary | ICD-10-CM

## 2015-08-29 DIAGNOSIS — M349 Systemic sclerosis, unspecified: Secondary | ICD-10-CM

## 2015-08-29 LAB — CBC WITH PLATELET
HEMATOCRIT: 36.6 % (ref 34.1–44.9)
HEMOGLOBIN: 12.2 g/dL (ref 11.2–15.7)
MEAN CORP HGB CONC: 33.3 g/dL (ref 31.0–37.0)
MEAN CORPUSCULAR HGB: 33.1 pg (ref 26.0–34.0)
MEAN CORPUSCULAR VOL: 99.2 fL (ref 80.0–100.0)
MEAN PLATELET VOLUME: 10.4 fL (ref 8.7–12.5)
PLATELET COUNT: 227 10*3/uL (ref 150–400)
RBC DISTRIBUTION WIDTH STD DEV: 45.8 fL (ref 35.1–46.3)
RBC DISTRIBUTION WIDTH: 12.7 % (ref 11.5–14.3)
RED BLOOD CELL COUNT: 3.69 M/uL — ABNORMAL LOW (ref 3.90–5.20)
WHITE BLOOD CELL COUNT: 6.2 10*3/uL (ref 4.0–11.0)

## 2015-08-29 LAB — CREATININE WITH GFR
CREATININE: 0.4 mg/dL (ref 0.4–1.2)
ESTIMATED GLOMERULAR FILT RATE: >60 mL/min (ref >60)

## 2015-08-29 LAB — ALANINE AMINOTRANSFERASE: ALANINE AMINOTRANSFERASE: 23 U/L (ref 12–45)

## 2015-08-29 LAB — CREATINE KINASE TOTAL: CREATINE KINASE TOTAL: 273 IU/L (ref 26–192)

## 2015-08-29 LAB — VITAMIN D,25 HYDROXY: VITAMIN D,25 HYDROXY: 28 ng/mL — ABNORMAL LOW (ref 30.0–100.0)

## 2015-08-29 LAB — ASPARTATE AMINOTRANSFERASE: ASPARTATE AMINOTRANSFERASE: 28 U/L (ref 8–34)

## 2015-08-29 MED ORDER — HYDROXYCHLOROQUINE SULFATE 200 MG PO TABS
ORAL_TABLET | ORAL | 2 refills | Status: DC
Start: 2015-08-29 — End: 2016-01-16

## 2015-08-29 MED ORDER — METHOTREXATE 2.5 MG PO TABS
ORAL_TABLET | ORAL | 2 refills | Status: DC
Start: 2015-08-29 — End: 2015-12-26

## 2015-08-29 MED ORDER — MYCOPHENOLATE MOFETIL 500 MG PO TABS
ORAL_TABLET | ORAL | 2 refills | Status: DC
Start: 2015-08-29 — End: 2016-01-16

## 2015-08-29 NOTE — Progress Notes (Signed)
Influenza Vaccine Procedure  August 29, 2015    1. Has the patient received the information for the influenza vaccine? Yes    2. Does the patient have any of the following contraindications?  Allergy to eggs? No  Allergic reaction to previous influenza vaccines? No  Any other problems to previous influenza vaccines? No  Paralyzed by Guillain-Barre syndrome?  No  Current moderate or severe illness? yes  Allergy to contact lens solution? No    3. The vaccine has been administered in the usual fashion.     Immunization information reviewed. Current VIS reviewed and given to patient/ guardian. Verbal assent obtained from patient/ guardian.  See immunization/Injection module or chart review for date of publication and additional information. Verbal assent obtained from patient/guardian. Comfort measures for possible side effects reviewed.

## 2015-08-29 NOTE — Progress Notes (Signed)
Fatigue and sore today  Stress  One more class at school  mtx 25mg  to 22.5mg       The primary progress note for this visit has been dictated through E-Scription. It can be viewed as an attachment to this encounter or through Chart Review under the NOTE Tab as an Rheumatology Office Note.      Review of Systems: Constitutional, Eyes, ENT/Mouth, Cardiovascular, Respiratory, GI, GU, Neuro, Psych, Heme/Lymph, Skin, Musculoskeletal, and Endocrine systems were reviewed and are NEGATIVE, except for what is dictated in the note.

## 2015-08-29 NOTE — Progress Notes (Signed)
Date of Service: 08/29/2015    HISTORY OF PRESENT ILLNESS:  This is a follow-up visit for this 39 year old woman with dermatomyositis and evidence of diffuse cutaneous sclerosis (sclerodermatous changes on the chest and arms).  She has atrophy of the skin and areas of erythema.  She has a history of inflammatory mass in the breast and focal erythema of the bladder.  PFTs showed decreased DLCO with air trapping.  A CT scan showed apical scarring.    The patient denies chest pain, dyspnea, and cough.  She denies fevers, eye pain, red eye, blurry vision, aphthae, diarrhea, hematochezia, dysuria and Raynaud's.  She denies joint pain, joint swelling, weakness, paresthesias, and falls.    The patient decreased her methotrexate from 25 mg to 22.5 mg and is doing well.  She remains on mycophenolate.     The patient has 1 semester left of school.  She has a presentation tonight.    PAST MEDICAL HISTORY:  Zoster on the face complicated by a facial cellulitis  Inflammatory breast mass  Bladder   Erythema nodosum  Anemia  Proteinuria  Decreased DLCO  Asthma     ALLERGIES AND ADVERSE REACTIONS:  No known drug allergies.    CURRENT MEDICATIONS:  Reviewed with the patient.    PHYSICAL EXAMINATION:  VITAL SIGNS:  Blood pressure is elevated at 137/95 (the patient believes it is related to her anxiety over presenting to her class tonight), pulse 75, SpO2 of 100%, weight 145 pounds, pain 6/10.  GENERAL:  She is in no acute distress.  She is alert and oriented.  No malar rash.  HEENT:  Anicteric sclerae.  Oropharynx is clear.  NECK:  Supple.  LUNGS:  Clear.  EXTREMITIES:  No pitting edema.  SKIN:  Areas of erythema, atrophy, sclerodermatous changes.  MUSCULOSKELETAL:  No synovitis.  Strength testing normal.    LABORATORY DATA:  Creatinine 0.4, GFR greater than 60, AST 21, ALT 22, CK 205  WBC 4.7; hematocrit 35; platelets 235,000     ASSESSMENT:  A 39 year old woman with scleroderma.    PLAN:  1.  Continue mycophenolate.  2.  Decreased  methotrexate to 20 mg weekly.  3.  Labs today.  4.  Followup in 3 months.    The patient verbalized an understanding and agreement with this plan.    ___________________________  Reviewed and Electronically Signed By: Coolidge Breeze MD  Sig Date: 09/16/2015  Sig Time: 12:51:44  Dictated By: Coolidge Breeze MD  Dict Date: 08/29/2015 Dict Time: 03 23 PM    Dictation Date and Time:08/29/2015 15:23:22  Transcription Date and Time:08/29/2015 18:13:54  eScription Dictation id: [id number] Confirmation # [id number]      cc: Royal Hawthorn MD

## 2015-08-30 ENCOUNTER — Encounter (HOSPITAL_BASED_OUTPATIENT_CLINIC_OR_DEPARTMENT_OTHER): Payer: Self-pay | Admitting: Internal Medicine

## 2015-08-30 MED ORDER — VITAMIN D 50 MCG (2000 UT) PO CAPS
3.0000 | ORAL_CAPSULE | Freq: Every day | ORAL | 5 refills | Status: AC
Start: 2015-08-30 — End: 2016-08-29

## 2015-09-18 ENCOUNTER — Other Ambulatory Visit (HOSPITAL_BASED_OUTPATIENT_CLINIC_OR_DEPARTMENT_OTHER): Payer: Self-pay | Admitting: Internal Medicine

## 2015-10-01 DIAGNOSIS — F4320 Adjustment disorder, unspecified: Secondary | ICD-10-CM | POA: Diagnosis not present

## 2015-10-03 ENCOUNTER — Telehealth (HOSPITAL_BASED_OUTPATIENT_CLINIC_OR_DEPARTMENT_OTHER): Payer: Self-pay | Admitting: Registered Nurse

## 2015-10-03 ENCOUNTER — Other Ambulatory Visit (HOSPITAL_BASED_OUTPATIENT_CLINIC_OR_DEPARTMENT_OTHER): Payer: Self-pay | Admitting: Internal Medicine

## 2015-10-03 NOTE — Progress Notes (Signed)
Pt returned call to say there was a problem with the script for Plaquenil.    [facility] and confirmed that although we have confirmation of script being rec's 08/28/15, there is no record of it.    Rayburn Export of the Pharmacy staff stated he will add this to her scripts.

## 2015-10-03 NOTE — Progress Notes (Signed)
Returned call to pt and left message that the script has been rectified.  Apologized to pt for confusion.

## 2015-10-03 NOTE — Progress Notes (Signed)
Reviewed pt chart; Plaquenil ordered 11/16 with 2 refills.  Returned call to pt; left message to please contact Pharmacy as according to our records pt has 2 refills for Plaquenil.    Requested call back if there are any questions or concerns when she contacts Pharmacy

## 2015-10-04 ENCOUNTER — Other Ambulatory Visit (HOSPITAL_BASED_OUTPATIENT_CLINIC_OR_DEPARTMENT_OTHER): Payer: Self-pay | Admitting: Geriatric Medicine

## 2015-10-04 NOTE — Progress Notes (Signed)
PER Pharmacy, Julie Freeman is a 39 year old female has requested a refill of elmiron 100.    Last Office Visit: 10-15-14 with PCP  Last Physical Exam: 03-14-14    Other Med Adult:  Most Recent BP Reading(s)  08/29/15 : (!) 137/95          CHOLESTEROL (mg/dL)   Date Value   01/24/2013 124   ----------    LOW DENSITY LIPOPROTEIN DIRECT (mg/dL)   Date Value   01/24/2013 51   ----------    HIGH DENSITY LIPOPROTEIN (mg/dL)   Date Value   01/24/2013 63 (H)   ----------    TRIGLYCERIDES (mg/dl)   Date Value   09/10/2011 51   ----------      No results found for: TSHSC      No results found for: TSH      HEMOGLOBIN A1C (%)   Date Value   01/24/2013 5.0   ----------        INR (no units)   Date Value   10/10/2010 1.1 (L)   ----------       Documented patient preferred pharmacies:    [id number] [place] [place] - [place]  Phone: [phone] Fax: [phone]

## 2015-10-08 DIAGNOSIS — F4320 Adjustment disorder, unspecified: Secondary | ICD-10-CM | POA: Diagnosis not present

## 2015-10-08 NOTE — Telephone Encounter (Signed)
-----   Message from Annette Stable sent at 10/08/2015 10:26 AM EST -----  Regarding: Julie Freeman [id number], 39 year old, female, Telephone Information:  Home Phone      [phone]  Work Phone      Not on file.  Mobile          [phone]     Patient's PCP: Rica Mote, MD    Person calling on behalf of patient: Pharmacy.    Calling to request refill(s) to be sent to :Documented patient preferred pharmacy:    [id number] [place] [place] [facility] [place]  Phone: [phone] Fax: [phone]       Medications are as follows:      -  ELMIRON 100 MG capsule

## 2015-10-08 NOTE — Progress Notes (Signed)
Pharmacy is calling again - please see refill request below from 10/04/15

## 2015-10-15 DIAGNOSIS — F4320 Adjustment disorder, unspecified: Secondary | ICD-10-CM | POA: Diagnosis not present

## 2015-10-17 ENCOUNTER — Ambulatory Visit (HOSPITAL_BASED_OUTPATIENT_CLINIC_OR_DEPARTMENT_OTHER): Payer: PRIVATE HEALTH INSURANCE | Admitting: Dermatology

## 2015-10-17 DIAGNOSIS — M3390 Dermatopolymyositis, unspecified, organ involvement unspecified: Secondary | ICD-10-CM | POA: Diagnosis not present

## 2015-10-17 DIAGNOSIS — M3313 Other dermatomyositis without myopathy: Secondary | ICD-10-CM

## 2015-10-17 NOTE — Patient Instructions (Signed)
[facility]  [address]  [phone]  Phone:  [phone]

## 2015-10-17 NOTE — Progress Notes (Signed)
Julie Freeman is a very pleasant 40 year old female who presents to the dermatology clinic today for follow-up.    Last derm visit date: 04/09/2015  Last derm visit diagnoses/concerns: dermatomyositis  Last derm visit treatments:  Dermatomyositis  - Skin is flaring today in a photodistribution  - Emphasized importance of application of SPF 30 or greater sunscreen regularly during sun exposure. Provided samples today  - She can use triamcinolone 0.1% cream 1-2 times daily to affected areas for up to 14 days per month. If this combined with sun protection are not sufficient to control flares we can begin a course of prednisone. Pt is reluctant to start this today and reports that her skin symptoms are manageable so we will treat with topical steroids at this time.   -She is currently on plaquenil, methotrexate, cellcept with significant subjective improvement in muscular function  - She will continue close follow-up with Julie Freeman, next appt is in 2 months  -Reviewed gentle skin care measures at length with patient including taking short lukewarm showers, using a gentle moisturizing soap such as dove or cetaphil, and use of an emollient cream after bathing such as dove/cetaphil/Eucerin/CeraVe  .  RTC 3 months or earlier if needed PRN     Interim history:  Pt is pleasant 40 yo F w/ hx of dermatomyositis who presents to dermatology clinic for f/u.     Pt reports that since last visit, her scalp has been very bothersome. In particular, she notes peeling, itching, scabs, and sores on the scalp. She has noted some dryness and itching of her elbows. She reports that she has recently been evaluated by pulmonology who felt that she had asthma. Pt is currently taking methotrexate 20 mg weekly. She is taking cellcept 2g daily. She is taking plaquenil 200 mg BID.  Despite this she continues to have inflammation and discomfort in her skin as well as on her scalp.      Past Medical History    Dermatomyositis     Shingles           Current Outpatient Prescriptions:     ELMIRON 100 MG capsule, TAKE ONE CAPSULE BY MOUTH 3 TIMES A DAY BEFORE MEALS, Disp: 270 capsule, Rfl: 3    folic acid (FOLVITE) 1 MG tablet, TAKE 2 TABLETS BY MOUTH DAILY, Disp: 60 tablet, Rfl: 5    Cholecalciferol (VITAMIN D) 2000 UNITS CAPS, Take 3 capsules by mouth daily, Disp: 90 capsule, Rfl: 5    methotrexate 2.5 MG tablet, TAKE 10 TABLETS BY MOUTH ONCE A WEEK- IN DIVIDED DOSING, Disp: 40 tablet, Rfl: 2    mycophenolate (CELLCEPT) 500 MG tablet, TAKE 2 TABLETS BY MOUTH 2 (TWO) TIMES DAILY., Disp: 120 tablet, Rfl: 2    hydroxychloroquine (PLAQUENIL) 200 MG tablet, TAKE 2 TABLETS BY MOUTH DAILY., Disp: 60 tablet, Rfl: 2    ammonium lactate (LAC-HYDRIN) 12 % lotion, APPLY 15MLS TOPICALLY 2 TIMES A DAY AS NEEDED, Disp: 400 mL, Rfl: 2    fluticasone (FLONASE) 50 MCG/ACT nasal spray, 2 sprays by Each Nostril route daily, Disp: 16 g, Rfl: 11    ibuprofen (ADVIL,MOTRIN) 200 MG tablet, Take 200 mg by mouth every 8 (eight) hours as needed for Pain, Disp: , Rfl:     Calcium Carbonate-Vitamin D 600-400 MG-UNIT TABS Tablet, TAKE ONE (1) TABLET TWICE DAILY. DO NOT TAKE WITHIN A FEW HOURS OF MYCOPHENOLATE., Disp: 60 tablet, Rfl: 2    albuterol (VENTOLIN HFA) 108 (90 BASE) MCG/ACT inhaler, Inhale 2  puffs into the lungs every 4 (four) hours as needed for Wheezing or Shortness of breath, Disp: 1 Inhaler, Rfl: 1    mupirocin (BACTROBAN) 2 % ointment, APPLY TOPICALLY THREE TIMES DAILY, Disp: 22 g, Rfl: 5    levonorgestrel (MIRENA) 20 MCG/24HR IUD, 1 Device by Intrauterine route continuous. As Instructed, Disp: 1 Device, Rfl: 0    Review of Patient's Allergies indicates:   Peach                   Swelling    Comment:cheeks   Cat dander              Runny Nose    Physical Exam:  A focused dermatologic exam of the face, hands, scalp,distal upper extremities:  -AOx3, NAD, pleasant  Scalp with foci of scarring alopecia, blachable pink patches with dyspigmentation  . - Most  prominently on the periorbital skin but also on the lateral aspects of the cheek there are dyspigmented patches with intermixed hyper, hyopigmentation and telangiectasias. Today these areas have notable pink patches superimposed upon these areas of dyspigmentation.  - On the chest, in a V distribution, as well as on the posterior neck, there are dyspigmented patches with intermixed hyper/hypopigmentation and telangiectasias Today these areas have notable pink patches superimposed upon these areas of dyspigmentation that are sharply demarcated in a photodistribution  - Bilateral upper extremities are very thin with apparent loss of the subcutaneous fat.   - The bilateral lower extremities have 2+ symmetric pitting edema   - No sclerodactyly, no distal digital pitting  - Prominent nailfold telangiectasias    A/P: Very pleasant 40 year old female seen in dermatology clinic today for f/u evaluation of:  - Pt continues to have cutaneous symptoms and flare despite methotrexate 20 mg weekly, cellcept 2g BID, and plaquenil 200 mg BID  - At this point, I would greatly value the opinion of a colleague at [facility] who is an expert in dermatomyositis and autoimmune connective tissue disease, Julie Freeman  - I have requested a referral for Julie Freeman to see her. One consideration is where the patient would be a candidate for rituxumab, which I do not have experience using for dermatomyositis  - I will facilitate a referral to see Julie Freeman and will arrange for f/u with Julie Freeman pending her consultation   - Previously concern for other overlap symptoms (including lupus and scleroderma) have been suggested and clarification of Julie Freeman's diagnosis would also be beneficial

## 2015-10-17 NOTE — Progress Notes (Signed)
Pt feels safe at home

## 2015-10-22 DIAGNOSIS — F4320 Adjustment disorder, unspecified: Secondary | ICD-10-CM | POA: Diagnosis not present

## 2015-10-24 ENCOUNTER — Encounter (HOSPITAL_BASED_OUTPATIENT_CLINIC_OR_DEPARTMENT_OTHER): Payer: Self-pay | Admitting: Dermatology

## 2015-10-30 DIAGNOSIS — F4320 Adjustment disorder, unspecified: Secondary | ICD-10-CM | POA: Diagnosis not present

## 2015-10-31 ENCOUNTER — Other Ambulatory Visit (HOSPITAL_BASED_OUTPATIENT_CLINIC_OR_DEPARTMENT_OTHER): Payer: Self-pay | Admitting: Internal Medicine

## 2015-10-31 NOTE — Progress Notes (Signed)
PER Pharmacy, Julie Freeman is a 40 year old female has requested a refill of calcium carb-vit d 600-400.    Last Office Visit: 08-29-15 with Deliah Goody   Last Physical Exam: 03-14-14    Other Med Adult:  Most Recent BP Reading(s)  08/29/15 : Marland Kitchen 137/95          CHOLESTEROL (mg/dL)   Date Value   01/24/2013 124   ----------    LOW DENSITY LIPOPROTEIN DIRECT (mg/dL)   Date Value   01/24/2013 51   ----------    HIGH DENSITY LIPOPROTEIN (mg/dL)   Date Value   01/24/2013 63 (H)   ----------    TRIGLYCERIDES (mg/dl)   Date Value   09/10/2011 51   ----------      No results found for: TSHSC      No results found for: TSH      HEMOGLOBIN A1C (%)   Date Value   01/24/2013 5.0   ----------        INR (no units)   Date Value   10/10/2010 1.1 (L)   ----------       Documented patient preferred pharmacies:    [id number] [name], [place] - [place]. [facility] [place]  Phone: [phone] Fax: [phone]

## 2015-11-07 DIAGNOSIS — F4320 Adjustment disorder, unspecified: Secondary | ICD-10-CM | POA: Diagnosis not present

## 2015-11-09 ENCOUNTER — Other Ambulatory Visit (HOSPITAL_BASED_OUTPATIENT_CLINIC_OR_DEPARTMENT_OTHER): Payer: Self-pay | Admitting: Internal Medicine

## 2015-11-09 NOTE — Progress Notes (Signed)
Person calling on behalf of patient: Julie Freeman is a 40 year old female who is a patient requesting refills for medication(s)      - medication(s) request: Mupirocin 2% ointment  - last office visit: 10/17/2015  - last physical exam: 03/14/2014      Other Med Adult:  Most Recent BP Reading(s)  08/29/15 : (!) 137/95          CHOLESTEROL (mg/dL)   Date Value   01/24/2013 124   ----------    LOW DENSITY LIPOPROTEIN DIRECT (mg/dL)   Date Value   01/24/2013 51   ----------    HIGH DENSITY LIPOPROTEIN (mg/dL)   Date Value   01/24/2013 63 (H)   ----------    TRIGLYCERIDES (mg/dl)   Date Value   09/10/2011 51   ----------      No results found for: TSHSC      No results found for: TSH      HEMOGLOBIN A1C (%)   Date Value   01/24/2013 5.0   ----------        INR (no units)   Date Value   10/10/2010 1.1 (L)   ----------           Documented patient preferred pharmacies:    [id number] [place] [place] - [place]  Phone: [phone] Fax: [phone]

## 2015-11-12 DIAGNOSIS — F4320 Adjustment disorder, unspecified: Secondary | ICD-10-CM | POA: Diagnosis not present

## 2015-11-13 ENCOUNTER — Other Ambulatory Visit (HOSPITAL_BASED_OUTPATIENT_CLINIC_OR_DEPARTMENT_OTHER): Payer: Self-pay | Admitting: Registered Nurse

## 2015-11-13 MED ORDER — MUPIROCIN 2 % EX OINT
TOPICAL_OINTMENT | Freq: Three times a day (TID) | CUTANEOUS | 0 refills | Status: AC
Start: 2015-11-13 — End: 2015-12-11

## 2015-11-13 NOTE — Telephone Encounter (Signed)
-----   Message from [name] sent at 11/13/2015  9:54 AM EST -----  Regarding: [facility] pt; rxr  Filbert Berthold is a 40 year old female calling to request a refill of mupirocin (BACTROBAN) 2 % ointment.      Documented patient preferred pharmacies:    [id number] [name], [place] - [place]  Phone: [phone] Fax: [phone]

## 2015-11-28 DIAGNOSIS — F4320 Adjustment disorder, unspecified: Secondary | ICD-10-CM | POA: Diagnosis not present

## 2015-12-05 ENCOUNTER — Ambulatory Visit (HOSPITAL_BASED_OUTPATIENT_CLINIC_OR_DEPARTMENT_OTHER): Payer: PRIVATE HEALTH INSURANCE | Admitting: Internal Medicine

## 2015-12-05 DIAGNOSIS — F4320 Adjustment disorder, unspecified: Secondary | ICD-10-CM | POA: Diagnosis not present

## 2015-12-12 DIAGNOSIS — F4320 Adjustment disorder, unspecified: Secondary | ICD-10-CM | POA: Diagnosis not present

## 2015-12-23 ENCOUNTER — Other Ambulatory Visit (HOSPITAL_BASED_OUTPATIENT_CLINIC_OR_DEPARTMENT_OTHER): Payer: Self-pay | Admitting: Geriatric Medicine

## 2015-12-23 DIAGNOSIS — M349 Systemic sclerosis, unspecified: Secondary | ICD-10-CM

## 2015-12-23 DIAGNOSIS — M3390 Dermatopolymyositis, unspecified, organ involvement unspecified: Secondary | ICD-10-CM

## 2015-12-23 DIAGNOSIS — L930 Discoid lupus erythematosus: Secondary | ICD-10-CM

## 2015-12-23 DIAGNOSIS — M3313 Other dermatomyositis without myopathy: Secondary | ICD-10-CM

## 2015-12-25 ENCOUNTER — Encounter (HOSPITAL_BASED_OUTPATIENT_CLINIC_OR_DEPARTMENT_OTHER): Payer: Self-pay

## 2015-12-25 DIAGNOSIS — F4320 Adjustment disorder, unspecified: Secondary | ICD-10-CM | POA: Diagnosis not present

## 2015-12-26 ENCOUNTER — Other Ambulatory Visit (HOSPITAL_BASED_OUTPATIENT_CLINIC_OR_DEPARTMENT_OTHER): Payer: Self-pay | Admitting: Geriatric Medicine

## 2015-12-26 ENCOUNTER — Other Ambulatory Visit (HOSPITAL_BASED_OUTPATIENT_CLINIC_OR_DEPARTMENT_OTHER): Payer: Self-pay | Admitting: Internal Medicine

## 2015-12-26 DIAGNOSIS — M3313 Other dermatomyositis without myopathy: Secondary | ICD-10-CM

## 2015-12-26 DIAGNOSIS — M3390 Dermatopolymyositis, unspecified, organ involvement unspecified: Secondary | ICD-10-CM

## 2015-12-26 DIAGNOSIS — I7300 Raynaud's syndrome without gangrene: Secondary | ICD-10-CM | POA: Diagnosis not present

## 2015-12-26 DIAGNOSIS — R471 Dysarthria and anarthria: Secondary | ICD-10-CM | POA: Diagnosis not present

## 2015-12-26 DIAGNOSIS — R1310 Dysphagia, unspecified: Secondary | ICD-10-CM | POA: Diagnosis not present

## 2015-12-26 DIAGNOSIS — H01119 Allergic dermatitis of unspecified eye, unspecified eyelid: Secondary | ICD-10-CM | POA: Diagnosis not present

## 2015-12-26 DIAGNOSIS — Z5181 Encounter for therapeutic drug level monitoring: Secondary | ICD-10-CM | POA: Diagnosis not present

## 2015-12-26 NOTE — Progress Notes (Signed)
PER Pharmacy, Rubicela Fero Hackworth is a 40 year old female has requested a refill of METHOTREXATE 2.5 MG AND PLAQUENIL 200 MG TABLET .      Last Office Visit: 08/29/2015 with Jonelle Sidle  Last Physical Exam: 03/14/2014      Other Med Adult:  Most Recent BP Reading(s)  08/29/15 : (!) 137/95          CHOLESTEROL (mg/dL)   Date Value   01/24/2013 124   ----------    LOW DENSITY LIPOPROTEIN DIRECT (mg/dL)   Date Value   01/24/2013 51   ----------    HIGH DENSITY LIPOPROTEIN (mg/dL)   Date Value   01/24/2013 63 (H)   ----------    TRIGLYCERIDES (mg/dl)   Date Value   09/10/2011 51   ----------      No results found for: TSHSC      No results found for: TSH      HEMOGLOBIN A1C (%)   Date Value   01/24/2013 5.0   ----------        INR (no units)   Date Value   10/10/2010 1.1 (L)   ----------       Documented patient preferred pharmacies:    [name] [place] - [place]  Phone: [phone] Fax: [phone]

## 2015-12-26 NOTE — Progress Notes (Signed)
Appointment scheduled for: Rheumatology    Specialty Location:   Other: [facility]                                                   Specialist's name: Dr. Starleen Arms          Specialist's NPI#: [id number]                           Specialty Phone Number: [phone]     Specialty Fax Number: [phone]    Reason for appointment: M33.90 Dermatomyositis ([place]    Day of appt: Thursday                                    Date of appt: 12/26/2015    Time of appt: 10am   Patient Notification:  Phone call - Spoke with patient      Prior Auth Needed: No      If you have any questions concerning the referral authorization please call [phone].    If you have any questions concerning your appointment please call the specialty phone number above.

## 2016-01-01 DIAGNOSIS — F4320 Adjustment disorder, unspecified: Secondary | ICD-10-CM | POA: Diagnosis not present

## 2016-01-08 DIAGNOSIS — F4320 Adjustment disorder, unspecified: Secondary | ICD-10-CM | POA: Diagnosis not present

## 2016-01-16 ENCOUNTER — Encounter (HOSPITAL_BASED_OUTPATIENT_CLINIC_OR_DEPARTMENT_OTHER): Payer: Self-pay | Admitting: Internal Medicine

## 2016-01-16 ENCOUNTER — Ambulatory Visit (HOSPITAL_BASED_OUTPATIENT_CLINIC_OR_DEPARTMENT_OTHER): Payer: PRIVATE HEALTH INSURANCE | Admitting: Internal Medicine

## 2016-01-16 VITALS — BP 119/83 | HR 65 | Wt 145.0 lb

## 2016-01-16 DIAGNOSIS — Z5181 Encounter for therapeutic drug level monitoring: Secondary | ICD-10-CM | POA: Diagnosis not present

## 2016-01-16 DIAGNOSIS — M3390 Dermatopolymyositis, unspecified, organ involvement unspecified: Secondary | ICD-10-CM

## 2016-01-16 DIAGNOSIS — M3313 Other dermatomyositis without myopathy: Secondary | ICD-10-CM

## 2016-01-16 DIAGNOSIS — M349 Systemic sclerosis, unspecified: Secondary | ICD-10-CM

## 2016-01-16 LAB — CBC, PLATELET & DIFFERENTIAL
ABSOLUTE BASO COUNT: 0 10*3/uL (ref 0.0–0.1)
ABSOLUTE EOSINOPHIL COUNT: 0.2 10*3/uL (ref 0.0–0.8)
ABSOLUTE IMM GRAN COUNT: 0.02 10*3/uL (ref 0.00–0.03)
ABSOLUTE LYMPH COUNT: 1.1 10*3/uL (ref 0.6–5.9)
ABSOLUTE MONO COUNT: 0.8 10*3/uL (ref 0.2–1.4)
ABSOLUTE NEUTROPHIL COUNT: 3 10*3/uL (ref 1.6–8.3)
BASOPHIL %: 0.4 % (ref 0.0–1.2)
EOSINOPHIL %: 4.1 % (ref 0.0–7.0)
HEMATOCRIT: 35.2 % (ref 34.1–44.9)
HEMOGLOBIN: 12 g/dL (ref 11.2–15.7)
IMMATURE GRANULOCYTE %: 0.4 % (ref 0.0–0.4)
LYMPHOCYTE %: 21.5 % (ref 15.0–54.0)
MEAN CORP HGB CONC: 34.1 g/dL (ref 31.0–37.0)
MEAN CORPUSCULAR HGB: 33.6 pg (ref 26.0–34.0)
MEAN CORPUSCULAR VOL: 98.6 fL (ref 80.0–100.0)
MEAN PLATELET VOLUME: 10 fL (ref 8.7–12.5)
MONOCYTE %: 14.9 % — ABNORMAL HIGH (ref 4.0–13.0)
NEUTROPHIL %: 58.7 % (ref 40.0–75.0)
PLATELET COUNT: 270 10*3/uL (ref 150–400)
RBC DISTRIBUTION WIDTH STD DEV: 47.2 fL — ABNORMAL HIGH (ref 35.1–46.3)
RBC DISTRIBUTION WIDTH: 13.1 % (ref 11.5–14.3)
RED BLOOD CELL COUNT: 3.57 M/uL — ABNORMAL LOW (ref 3.90–5.20)
WHITE BLOOD CELL COUNT: 5.2 10*3/uL (ref 4.0–11.0)

## 2016-01-16 LAB — CREATINE KINASE TOTAL: CREATINE KINASE TOTAL: 292 IU/L (ref 26–192)

## 2016-01-16 LAB — RBCMORPH

## 2016-01-16 LAB — ASPARTATE AMINOTRANSFERASE: ASPARTATE AMINOTRANSFERASE: 36 U/L — ABNORMAL HIGH (ref 8–34)

## 2016-01-16 LAB — CREATININE WITH GFR
CREATININE: 0.3 mg/dL — ABNORMAL LOW (ref 0.4–1.2)
ESTIMATED GLOMERULAR FILT RATE: >60 mL/min (ref >60)

## 2016-01-16 LAB — ALANINE AMINOTRANSFERASE: ALANINE AMINOTRANSFERASE: 25 U/L (ref 12–45)

## 2016-01-16 LAB — ALBUMIN: ALBUMIN: 4.3 g/dL (ref 3.4–5.0)

## 2016-01-16 MED ORDER — METHOTREXATE 2.5 MG PO TABS
ORAL_TABLET | ORAL | 2 refills | Status: DC
Start: 2016-01-16 — End: 2016-05-05

## 2016-01-16 MED ORDER — HYDROXYCHLOROQUINE SULFATE 200 MG PO TABS
ORAL_TABLET | ORAL | 2 refills | Status: DC
Start: 2016-01-16 — End: 2016-06-03

## 2016-01-16 MED ORDER — MYCOPHENOLATE MOFETIL 500 MG PO TABS
ORAL_TABLET | ORAL | 2 refills | Status: AC
Start: 2016-01-16 — End: 2017-08-11

## 2016-01-16 NOTE — Progress Notes (Signed)
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  She has atrophy of the skin and areas of hyperpigmentation and hypopigmentation.      Previous PFTs showed decreased DLCO and CT scan with apical scarring.  She denies chest pain, dyspnea and cough.  CK is  minimally elevated.    She is on methotrexate 25 mg and mycophenolate 1000 mg twice a day for many years.  In August, she decreased methotrexate from 25 mg to 22.5 mg.  She has been on 20 mg since November.    She describes just a mild hand weakness but no other complaints today.    She sates besides mycophenolate and methotrexate, she remains on hydroxychloroquine.    PAST MEDICAL HISTORY:    1.  Zoster on the face complicated by facial cellulitis.   2.  Inflammatory breast mass.  No bladder or erythema.  3.  Erythema nodosum  4.  Anemia.  5.  Proteinuria.  6.  Decreased DLCO  7.  Asthma.    ALLERGIES AND ADVERSE REACTIONS:  No known drug allergies.    CURRENT MEDICATIONS:  Reviewed with the patient.    SOCIAL HISTORY:  The patient is to graduate in school in May.  She would like to try to get a master's in psychology.    Family history was reviewed.    ROS was unremarkable except for what is documented above.    PHYSICAL EXAMINATION:  VITAL SIGNS:  Blood pressure 119/83, pulse 65 and 51%, 145 pounds.  Pain 7/10,  BMI 22.  GENERAL:  She is in no acute distress.  She is alert and oriented.  Good eye contact.  Full affect.  HEENT:  No malar rash.  Oropharynx clear.  NECK:  Supple.  LUNGS:  Clear.  EXTREMITIES:  No pitting edema.  MUSCULOSKELETAL:  No synovitis appreciated.  No tender joints.  Narrow-based gait.    LABORATORY DATA:    Creatinine 0.4, direct GFR greater than 60.  AST 20, ALT 23.  WBC 6.2, hematocrit 37, platelets 227.    CK 273.    ASSESSMENT:  A 40 year old woman with  dermatomyositis and scleroderma.    PLAN:  1.  Labs today.  2.  Lower methotrexate to 60 mg weekly.  3.  Stay on mycophenolate.  4.  To lower mycophenolate.  5.  Stay on hydroxychloroquine.    6.  Follow up with ophthalmology.  7.  I recommend the patient look into the Scleroderm[employer] and Myos[employer] to look for possible educational scholarships.    I will see the patient back in 3 months, earlier if needed.    ___________________________  Reviewed and Electronically Signed By: Coolidge Breeze MD  Sig Date: 07/14/2016  Sig Time: 11:56:10  Dictated By: Coolidge Breeze MD  Dict Date: 01/16/2016 Dict Time: 04 08 PM    Dictation Date and Time:01/16/2016 16:08:47  Transcription Date and Time:01/16/2016 16:58:31  eScription Dictation id: [id number] Confirmation # [id number]      cc: Royal Hawthorn MD

## 2016-01-16 NOTE — Patient Instructions (Signed)
[email or web address]    Myositis.[place] organization for rare disorders

## 2016-01-16 NOTE — Addendum Note (Signed)
Addended by: Donalda Ewings on: 01/16/2016 04:50 PM     Modules accepted: Orders

## 2016-01-16 NOTE — Progress Notes (Signed)
The primary progress note for this visit has been dictated through E-Scription. It can be viewed as an attachment to this encounter or through Chart Review under the NOTE Tab as an Rheumatology Office Note.      Review of Systems: Constitutional, Eyes, ENT/Mouth, Cardiovascular, Respiratory, GI, GU, Neuro, Psych, Heme/Lymph, Skin, Musculoskeletal, and Endocrine systems were reviewed and are NEGATIVE, except for what is dictated in the note.

## 2016-01-17 ENCOUNTER — Encounter (HOSPITAL_BASED_OUTPATIENT_CLINIC_OR_DEPARTMENT_OTHER): Payer: Self-pay | Admitting: Internal Medicine

## 2016-01-22 DIAGNOSIS — F4320 Adjustment disorder, unspecified: Secondary | ICD-10-CM | POA: Diagnosis not present

## 2016-01-29 DIAGNOSIS — F4320 Adjustment disorder, unspecified: Secondary | ICD-10-CM | POA: Diagnosis not present

## 2016-02-04 DIAGNOSIS — F4320 Adjustment disorder, unspecified: Secondary | ICD-10-CM | POA: Diagnosis not present

## 2016-02-06 ENCOUNTER — Other Ambulatory Visit (HOSPITAL_BASED_OUTPATIENT_CLINIC_OR_DEPARTMENT_OTHER): Payer: Self-pay | Admitting: Internal Medicine

## 2016-02-06 NOTE — Progress Notes (Signed)
PER Pharmacy, Julie Freeman is a 40 year old female has requested a refill of Ammonium Lactate 12 % solution .      Last Office Visit: 01/16/2016 with Deliah Goody   Last Physical Exam: 03/14/2014      Other Med Adult:  Most Recent BP Reading(s)  01/16/16 : 119/83          Cholesterol (mg/dL)   Date Value   01/24/2013 124   ----------    LOW DENSITY LIPOPROTEIN DIRECT (mg/dL)   Date Value   01/24/2013 51   ----------    HIGH DENSITY LIPOPROTEIN (mg/dL)   Date Value   01/24/2013 63 (H)   ----------    TRIGLYCERIDES (mg/dl)   Date Value   09/10/2011 51   ----------      No results found for: TSHSC      No results found for: TSH      HEMOGLOBIN A1C (%)   Date Value   01/24/2013 5.0   ----------        INR (no units)   Date Value   10/10/2010 1.1 (L)   ----------      SODIUM (mmol/L)   Date Value   07/13/2013 139   ----------      POTASSIUM (mmol/L)   Date Value   07/13/2013 4.1   ----------          CREATININE (mg/dL)   Date Value   01/16/2016 0.3 (L)   ----------    Documented patient preferred pharmacies:    [id number] [name], [place]  Phone: [phone] Fax: [phone]

## 2016-02-19 DIAGNOSIS — F4320 Adjustment disorder, unspecified: Secondary | ICD-10-CM | POA: Diagnosis not present

## 2016-02-20 ENCOUNTER — Other Ambulatory Visit (HOSPITAL_BASED_OUTPATIENT_CLINIC_OR_DEPARTMENT_OTHER): Payer: Self-pay | Admitting: Pulmonary Disease

## 2016-02-20 NOTE — Progress Notes (Signed)
PER Pharmacy, Julie Freeman is a 40 year old female has requested a refill of Ventolin 108 .      Last Office Visit: 10/17/2015 with Gunnar Bulla   Last Physical Exam: 03/14/2014      Other Med Adult:  Most Recent BP Reading(s)  01/16/16 : 119/83          Cholesterol (mg/dL)   Date Value   01/24/2013 124   ----------    LOW DENSITY LIPOPROTEIN DIRECT (mg/dL)   Date Value   01/24/2013 51   ----------    HIGH DENSITY LIPOPROTEIN (mg/dL)   Date Value   01/24/2013 63 (H)   ----------    TRIGLYCERIDES (mg/dl)   Date Value   09/10/2011 51   ----------      No results found for: TSHSC      No results found for: TSH      HEMOGLOBIN A1C (%)   Date Value   01/24/2013 5.0   ----------        INR (no units)   Date Value   10/10/2010 1.1 (L)   ----------      SODIUM (mmol/L)   Date Value   07/13/2013 139   ----------      POTASSIUM (mmol/L)   Date Value   07/13/2013 4.1   ----------          CREATININE (mg/dL)   Date Value   01/16/2016 0.3 (L)   ----------    Documented patient preferred pharmacies:    [id number] [name], [place] - [address]  Phone: [phone] Fax: [phone]

## 2016-02-21 DIAGNOSIS — T1490 Injury, unspecified: Secondary | ICD-10-CM | POA: Diagnosis not present

## 2016-02-21 DIAGNOSIS — M25571 Pain in right ankle and joints of right foot: Secondary | ICD-10-CM | POA: Diagnosis not present

## 2016-02-25 DIAGNOSIS — F4320 Adjustment disorder, unspecified: Secondary | ICD-10-CM | POA: Diagnosis not present

## 2016-03-02 ENCOUNTER — Ambulatory Visit (HOSPITAL_BASED_OUTPATIENT_CLINIC_OR_DEPARTMENT_OTHER): Payer: No Typology Code available for payment source | Admitting: Nurse Practitioner

## 2016-03-02 ENCOUNTER — Encounter (HOSPITAL_BASED_OUTPATIENT_CLINIC_OR_DEPARTMENT_OTHER): Payer: Self-pay | Admitting: Nurse Practitioner

## 2016-03-02 VITALS — BP 106/70 | Wt 150.0 lb

## 2016-03-02 DIAGNOSIS — M545 Low back pain, unspecified: Secondary | ICD-10-CM
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
## 2016-03-03 DIAGNOSIS — F4320 Adjustment disorder, unspecified: Secondary | ICD-10-CM | POA: Diagnosis not present

## 2016-03-10 DIAGNOSIS — F4320 Adjustment disorder, unspecified: Secondary | ICD-10-CM | POA: Diagnosis not present

## 2016-03-16 ENCOUNTER — Ambulatory Visit (HOSPITAL_BASED_OUTPATIENT_CLINIC_OR_DEPARTMENT_OTHER): Payer: PRIVATE HEALTH INSURANCE | Admitting: Geriatric Medicine

## 2016-03-17 ENCOUNTER — Ambulatory Visit (HOSPITAL_BASED_OUTPATIENT_CLINIC_OR_DEPARTMENT_OTHER): Payer: PRIVATE HEALTH INSURANCE | Admitting: Geriatric Medicine

## 2016-03-17 DIAGNOSIS — F4320 Adjustment disorder, unspecified: Secondary | ICD-10-CM | POA: Diagnosis not present

## 2016-03-19 ENCOUNTER — Ambulatory Visit (HOSPITAL_BASED_OUTPATIENT_CLINIC_OR_DEPARTMENT_OTHER): Payer: PRIVATE HEALTH INSURANCE | Admitting: Geriatric Medicine

## 2016-03-23 ENCOUNTER — Encounter (HOSPITAL_BASED_OUTPATIENT_CLINIC_OR_DEPARTMENT_OTHER): Payer: Self-pay | Admitting: Nurse Practitioner

## 2016-03-23 ENCOUNTER — Ambulatory Visit (HOSPITAL_BASED_OUTPATIENT_CLINIC_OR_DEPARTMENT_OTHER): Payer: PRIVATE HEALTH INSURANCE | Admitting: Nurse Practitioner

## 2016-03-23 VITALS — BP 112/70 | Ht 68.0 in | Wt 152.0 lb

## 2016-03-23 DIAGNOSIS — Z833 Family history of diabetes mellitus: Secondary | ICD-10-CM

## 2016-03-23 DIAGNOSIS — M3390 Dermatopolymyositis, unspecified, organ involvement unspecified: Secondary | ICD-10-CM

## 2016-03-23 DIAGNOSIS — Z13220 Encounter for screening for lipoid disorders: Secondary | ICD-10-CM | POA: Diagnosis not present

## 2016-03-23 DIAGNOSIS — R928 Other abnormal and inconclusive findings on diagnostic imaging of breast: Secondary | ICD-10-CM

## 2016-03-23 DIAGNOSIS — J4520 Mild intermittent asthma, uncomplicated: Secondary | ICD-10-CM | POA: Diagnosis not present

## 2016-03-23 DIAGNOSIS — R918 Other nonspecific abnormal finding of lung field: Secondary | ICD-10-CM | POA: Diagnosis not present

## 2016-03-23 DIAGNOSIS — Z0000 Encounter for general adult medical examination without abnormal findings: Secondary | ICD-10-CM | POA: Diagnosis not present

## 2016-03-23 DIAGNOSIS — M3313 Other dermatomyositis without myopathy: Secondary | ICD-10-CM

## 2016-03-23 LAB — FREE THYROXINE: FREE THYROXINE: 0.94 ng/dL (ref 0.76–1.46)

## 2016-03-23 LAB — LIPID PANEL
Cholesterol: 148 mg/dL (ref 0–239)
HIGH DENSITY LIPOPROTEIN: 72 mg/dL (ref 40–?)
LOW DENSITY LIPOPROTEIN DIRECT: 61 mg/dL (ref 0–189)
TRIGLYCERIDES: 76 mg/dL (ref 0–150)

## 2016-03-23 LAB — THYROID SCREEN TSH REFLEX FT4: THYROID SCREEN TSH REFLEX FT4: 4.26 u[IU]/mL — ABNORMAL HIGH (ref 0.358–3.740)

## 2016-03-23 NOTE — Progress Notes (Signed)
SUBJECTIVE:  Julie Freeman is a 40 year old female who presents to establish care.     Chronic Issues:  Lower back pain: improving with chiropractic therapy. Has chronic urinary frequency, has seen Dr. Arcelia Jew, on Elmiron 100 mg TID with some improvement. Urodynamic testing was suggested last yr. S/p bladder biopsy 2014, negative. Normal ua 2016.     Dermatomyositis: diagnosed in 2005 at [facility] and stable today; saw Dr. Corey Harold 2016, referred to an expert at [facility], Dr. Rod Holler Vleguels-pt could not continue with care d/t poor insurance coverage.-open enrollment in September.  She is consider switching to a new insurance coverage and transfer care to [facility] to receive IVIG treatment which is only offered at [facility] and [facility]. S    Mild Asthma: on PRN albuterol,  Needs yrly PFT and chest CT d/t dermatomyositis. Denies cough, sob or cp.     Dense L breast 2015, missed 6 months follow up, likely d/t school.      Social hx: recently graduated (June) with Bachelor degree in psychology at [employer]:  Constitutional: Patient denies any unexpected weight change, chronic fatigue, fevers or chills.  GI: Patient denies abdominal pain and has a good appetite, no nausea/vomiting, and normal bowel function without hematochezia.  [id number] denies nocturia, dysuria, frequency, hesitancy and hematuria.  MS: Patient denies any flank pain, back pain, myalgias, or recent skeletal fractures.  Neurologic: Patient denies any new recent headaches or change in vision. No unexplained dizziness, muscle weakness, or paresthesia.      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 Will give albuterol for            prn use. Plus flonase for trigger mgmt.       Monitoring for medication induced vision  changes         Date Noted: 05/10/2015            July '16                         Pt taking plaquenil/hydroxychloroquine . Needs            maculopathy screening next in 2017 (5 years after            starting hydroxychloroquine), then annually             thereafter.             2016 - exams wnl.                         From Dr Lonia Chimera, Ophthalmology, "The dilated            fundus exam did not reveal             any maculopathy.             New revised recommendations on screening for            plaquenil maculopathy suggest that after  an            initial baseline exam patients taking plaquenil            should have annual screening starting after 5            years.New data have shown that the risk of            toxicity increases sharply towards 1% after 5-7            years of use or after a cumulative dose of            1000g.The patient should be monitored with            automated visual field testing and macula OCT            yearly after screening is initiated.she was also            instructed to return to the clinic immediately and            to stop taking Plaquenil if any abnormalities in            the vision develop.Her recent macula OCT and            visual field tests were wnl."      Abnormal CT scan, lung         Date Noted: 12/12/2014            CT Lung Jan '16: 1. Small amount of ill-defined            peribronchial opacity in the right upper lobe.            Differential diagnosis includes inflammatory            disease             or scarring.             2. Pulmonary apical scarring.             3. Mild air trapping in the left lower lobe.             4. Mild right hilar and borderline subcarinal            lymphadenopathy.                         Repeat chest CT 6 months later in July 2016 is            unchanged. Some mild apical scarring, and stable            prominent hilar/subcarinal adenopathy. Discussed            with radiologist; rec f/u in 1 year, though            unlikely  malignancy.       Urinary urgency         Date Noted: 09/04/2013            Per Urology (Dr Arcelia Jew) June '16:             Likely the immune process that is affecting her            epidermis is also likely affecting her bladder            mucosa.            - Continue Elmiron            - UDS given continued  symptoms            - ?Botox / Mirabegron would be helpful for            urgency, frequency- Continue diet low on bladder            irritatants: including acidic foods and             spicy foods.             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  +dysuria and            frequency. -fevers, chills, N/V, flank pain, abd            pain, dysparenuria, unusual vag discharge nor            malodor.  Normal physical exam (?lighter pink            vaginal mucousa, but moist well rugated mucosa)            and wet mount.  Suspect microabrasions (other ddx:            HSV, candida, dermatitis/dermatosis,             constipation, manfiestation of systemic dz- has             scleroderma/dermatomyositis vs referred pain).            Advised pt to note any associated activites/sx            with the vulvodynia (related to menses vs            intercourse vs vaginal dryness vs constipation)            and make appt to be seen             when symptomatic.        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      Bilateral ovarian simple cysts         Date Noted: 09/11/2011            Seen on [place] 2011 at previous health care facility            in [place]. See [place] docs      PPD negative         Date Noted: 09/11/2011            Last measured 05/2010 at Outside facility. Records            in scanned docs      Dermatomyositis [facility])         Date Noted: 10/16/2010            Per Dr Corey Harold (Derm) Jan '16:             Continued Sx on hydroxychloroquine 200mg  qd, mtx            2.5mg  weekly, mycophenolate 500mg  bid so Dr            Corey Harold (Derm) referring to [facility] who is an expert            in dermatomyositis and autoimmune connective            tissue disease, Ned Grace Vleguels. ? Rituximab candidate.             Dermatomyositis            - Stable today from derm perspective            - Emphasized importance of application of SPF 30            or greater sunscreen regularly during sun            exposure. Provided samples today- She can use            clobetasol 0.05% cream 1-2 times daily to affected            areas of extremities and chest for relief of            inflammation from sun exposure -She is currently            on plaquenil, methotrexate, cellcept with            significant             subjective improvement in muscular function            - She will continue close follow-up with            Dr.Chapnick -Reviewed gentle skin care measures at            length with patient including taking short  lukewarm showers, using a gentle moisturizing soap             such as dove or cetaphil, and use of an emollient            cream after bathing such as             dove/cetaphil/Eucerin/CeraVe                          Other key points:             Left biceps bx- 10/31/03 marked perifascicular            atrophy. Extensive inflammatory infiltrate            predominantly in vessel walls. Degenerating and            regenerating fibers extensively.  Focal inc            endomysial connective tissue.  Nl glycogen and            lipi.  Targetoid and moth-eaten fibers.                          01/2011: Eye exam - no signs of macular toxicity            from plaquenil. As per optho note "rtc for a            visual field test in 3-6 months and rtc in 1            year."            Relevant studies:                         CT Chest 06/02/2010 (performed in [place], see            scanned docs)             "1. No adenopathy             2. Small dependent densities, probably due to            focal atelectasis. 3. Small pleural lesion, which            may be due to small plaque involving the             posterolateral left hemithorax."                        ECHO 01/24/2010 (performed in [place], see            scanned docs) Normal chambers, valves, LVEF approx            66%, NORMAL ECHO                        pfts- 5/12                                    Previous treatments included Protopic, topical            antibiotic , cephalexin, tetracycline, topical            steroids, and mycophenolate. She has had other             rashes  on sun exposed areas.  She was given IV steroids            initially and then oral             steroids,             methotrexate, and hydroxychloroquine, which she            remained on for many             years.             In [place], she received 4 weekly infusions of            rituximab, but the cycle             was not repeated 6             months afterwards.                  Scleroderma ([place])         Date Noted: 10/16/2010             Primary sx are skin related + mild dysphagia (May            '16) - PFTs Jan '16 - mild reduction in diffusion            capacity but no pulm fibrosis                         - Chest CT Jan '16-  with RUL opacity -            inflammatory Vs Scaring; apical scaring, mild air            trapping at LLL, mild R hilar and subcarinal LAD                        Other hx: dermatomyositis, erythema nodosum and            inflammatory arthritis. At             some point was also being worked up for Lupus                        Current meds (Nov'16):            - decrease methotrexate to 20 mg weekly            - mycophenolate 2000 mg twice a day.-            hydroxychloroquine 400mg  daily.            - High dose Vitamin D            - Elmiron 100mg  tid (for interstitial cystitis)            - Sunscreen.5. She will follow-up with dermatology            for outbreaks on her scalp.6. I will see her back            in 3 months, earlier if needed.                        PM-SCL borderline                        Pulm Eval July '16 - mild intermittent asthma,  possible allergic sx. Bronchodilator response and            air trapping with small obs dysfunction, however            no ILD or pulm vascular disease based on PFTs.            Adenopathy so ?             Reactive LN. Increased risk for lymphoma.             Methacholine challenge testing to confirm dx of            asthmarx of rhinitis; trial of flonase; should d/c            zicam/oxymetazoline as this             could lead to rebound congestion            Chest CT in 6 months to f/u adenopathy and RUL            abnormality       Breast nodule         Date Noted: 10/16/2010            S/P lumpectomy 2008 - histopath Benign,            Inflammatory (see [facility]             scanned records)      Shingles         Date Noted: 10/16/2010      Proteinuria         Date Noted: 10/16/2010            While pregnant      Seasonal allergies         Date  Noted: 10/16/2010          Current Outpatient Prescriptions:  VENTOLIN HFA 108 (90 Base) MCG/ACT inhaler INHALE 2 PUFFS INTO THE LUNGS EVERY 4 (FOUR) HOURS AS NEEDED FOR WHEEZING OR SHORTNESS OF BREATH Disp: 18 Inhaler Rfl: 1   ammonium lactate (LAC-HYDRIN) 12 % lotion APPLY 15MLS TOPICALLY 2 TIMES A DAY AS NEEDED Disp: 400 mL Rfl: 11   hydroxychloroquine (PLAQUENIL) 200 MG tablet TAKE 2 TABLETS BY MOUTH DAILY. Disp: 60 tablet Rfl: 2   methotrexate 2.5 MG tablet TAKE 6 TABLETS BY MOUTH ONCE A WEEK- IN DIVIDED DOSING Disp: 24 tablet Rfl: 2   mycophenolate (CELLCEPT) 500 MG tablet TAKE 2 TABLETS BY MOUTH 2 (TWO) TIMES DAILY. Disp: 120 tablet Rfl: 2   mupirocin (BACTROBAN) 2 % ointment Apply topically 3 (three) times daily Disp: 22 g Rfl: 5   Calcium Carbonate-Vitamin D 600-400 MG-UNIT TABS Tablet TAKE ONE (1) TABLET TWICE DAILY. DO NOT TAKE WITHIN A FEW HOURS OF MYCOPHENOLATE. Disp: 180 tablet Rfl: 3   ELMIRON 100 MG capsule TAKE ONE CAPSULE BY MOUTH 3 TIMES A DAY BEFORE MEALS Disp: 270 capsule Rfl: 3   folic acid (FOLVITE) 1 MG tablet TAKE 2 TABLETS BY MOUTH DAILY Disp: 60 tablet Rfl: 5   Cholecalciferol (VITAMIN D) 2000 UNITS CAPS Take 3 capsules by mouth daily Disp: 90 capsule Rfl: 5   fluticasone (FLONASE) 50 MCG/ACT nasal spray 2 sprays by Each Nostril route daily Disp: 16 g Rfl: 11   ibuprofen (ADVIL,MOTRIN) 200 MG tablet Take 200 mg by mouth every 8 (eight) hours as needed for Pain Disp:  Rfl:    levonorgestrel (MIRENA) 20 MCG/24HR IUD 1 Device by Intrauterine route continuous.  As Instructed Disp: 1 Device Rfl: 0     No current facility-administered medications for this visit.     Past Medical History:  No date: Dermatomyositis ([place])  No date: Shingles    Past Surgical History:  No date: BREAST LUMPECTOMY      Family History    No Known Family History Mother     Diabetes Father          Social History   Marital status: Divorced  Spouse name: N/A    Years of education: N/A  Number of children: 1     Occupational  History  unemployed     STUDENT NONE      Social History Main Topics   Smoking status: Never Smoker    Smokeless tobacco: Never Used    Alcohol use No    Comment: rare    Drug use: No    Sexual activity: Yes    Partners: Female    Patent examiner protection: IUD     Other Topics Concern   None on file     Social History Narrative    Has 16 year old son, pervasive developmental disorder. Son socialable but "learns differently" DM type 1, peanut allergy.         Jan '15    [employer], studying psychology (started part time in 2012). Really likes it, doing well.  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 Enjoys working from home. Son now 63. In relationship, going well.  Loves singing at church. Lives with youngest brother and her son.                                    ALLERGIES:  Review of Patient's Allergies indicates:   Peach                   Swelling    Comment:cheeks   Cat dander              Runny Nose    Immunization History   Administered Date(s) Administered   . INFLUENZA VIRUS TRI W/PRESV VACCINE 18/> YRS IM (PRIVATE) 11/27/2010, 09/10/2011   . Influenza Virus Quad Presv Free Vacc 3/> Yrs IM 06/29/2013, 10/15/2014, 08/29/2015   . PNEUMOCOCCAL POLYSACCHARIDE VACCINE v23 10/15/2014   . Tdap 09/10/2011       PHYSICAL EXAM:   03/23/16  0918   BP: 112/70   Weight: 68.9 kg (152 lb)   Height: 5\' 8"  (1.727 m)     Constitutional: Well developed, Well nourished, No acute distress,  HEENT: NCAT, OP clear, MMM, sclera anicteric  CV:: RRR,NL S1/S2,  No murmurs, rubs or gallops.   Resp::CTAB, No respiratory distress, No wheezing, crackles, rhonchi  Abdomen: Soft, non-tender, non-distended, Normal bowel sounds.  Extremities: distal pulses intact and symmetrical. No edema.  Neurologic:  No focal deficits noted.   Skin: no suspicious lesions or rash.         ASSESSMENT/PLAN:  (Z00.00) Physical exam  (primary encounter diagnosis)  Plan: THYROID SCREEN TSH REFLEX FT4          (J45.20) Mild intermittent asthma  without complication  Comment: stable  Plan: REFERRAL TO CARDIO-PULMONARY LAB ( INT)            (Z83.3) Family history of diabetes mellitus (DM)  Plan: HEMOGLOBIN A1C, COLLECTION VENOUS BLOOD         VENIPUNCTURE            (  Z13.220) Screening cholesterol level  Plan: LIPID PANEL            (R92.8) Abnormal mammogram of left breast  Plan: [facility]HY DI[facility]NOSTIC UNILATERAL LEFT W CAD            (R91.8) Abnormal CT scan, lung  Plan: CT ANGIOGRAPHY CHEST W/CONTRAST/NONCONTRAST                (M33.90) Dermatomyositis ([place])  Plan: REFERRAL TO CARDIO-PULMONARY LAB ( INT) annual PFT and chest CT                *I have discussed the frequency, duration, and side-effects of  the prescribed medications.  The patient expresses knowledge and understanding of medications including possible side-effects and barriers. No barriers to adherence were identified.     *Medications and allergies are reviewed at this visit.  If medication changes were made during this visit, the patient was given a new medication sheet.

## 2016-03-24 ENCOUNTER — Encounter (HOSPITAL_BASED_OUTPATIENT_CLINIC_OR_DEPARTMENT_OTHER): Payer: Self-pay | Admitting: Nurse Practitioner

## 2016-03-24 LAB — HEMOGLOBIN A1C
ESTIMATED AVERAGE GLUCOSE: 97 (ref 74–160)
HEMOGLOBIN A1C: 5 % (ref 4.0–5.6)

## 2016-03-26 DIAGNOSIS — F4320 Adjustment disorder, unspecified: Secondary | ICD-10-CM | POA: Diagnosis not present

## 2016-03-30 ENCOUNTER — Ambulatory Visit (HOSPITAL_BASED_OUTPATIENT_CLINIC_OR_DEPARTMENT_OTHER): Payer: PRIVATE HEALTH INSURANCE

## 2016-03-31 DIAGNOSIS — F4320 Adjustment disorder, unspecified: Secondary | ICD-10-CM | POA: Diagnosis not present

## 2016-04-01 ENCOUNTER — Encounter (HOSPITAL_BASED_OUTPATIENT_CLINIC_OR_DEPARTMENT_OTHER): Payer: Self-pay | Admitting: Geriatric Medicine

## 2016-04-02 ENCOUNTER — Other Ambulatory Visit (HOSPITAL_BASED_OUTPATIENT_CLINIC_OR_DEPARTMENT_OTHER): Payer: Self-pay | Admitting: Internal Medicine

## 2016-04-02 NOTE — Progress Notes (Signed)
PER Pharmacy, Julie Freeman is a 40 year old female has requested a refill of Folic Acid 1 mg.      Last Office Visit: 03/23/16 with Dr. Erick Alley  Last Physical Exam: 03/23/16      Other Med Adult:  Most Recent BP Reading(s)  03/23/16 : 112/70          Cholesterol (mg/dL)   Date Value   03/23/2016 148   ----------    LOW DENSITY LIPOPROTEIN DIRECT (mg/dL)   Date Value   03/23/2016 61   ----------    HIGH DENSITY LIPOPROTEIN (mg/dL)   Date Value   03/23/2016 72   ----------    TRIGLYCERIDES (mg/dL)   Date Value   03/23/2016 76   ----------        THYROID SCREEN TSH REFLEX FT4 (uIU/mL)   Date Value   03/23/2016 4.260 (H)   ----------      No results found for: TSH      HEMOGLOBIN A1C (%)   Date Value   03/23/2016 5.0   ----------        INR (no units)   Date Value   10/10/2010 1.1 (L)   ----------      SODIUM (mmol/L)   Date Value   07/13/2013 139   ----------      POTASSIUM (mmol/L)   Date Value   07/13/2013 4.1   ----------          CREATININE (mg/dL)   Date Value   01/16/2016 0.3 (L)   ----------    Documented patient preferred pharmacies:    [id number] - [place], [place] [address]  Phone: [phone] Fax: [phone]

## 2016-04-06 DIAGNOSIS — M3390 Dermatopolymyositis, unspecified, organ involvement unspecified: Secondary | ICD-10-CM | POA: Diagnosis not present

## 2016-04-06 DIAGNOSIS — Z79899 Other long term (current) drug therapy: Secondary | ICD-10-CM | POA: Diagnosis not present

## 2016-04-08 DIAGNOSIS — F4320 Adjustment disorder, unspecified: Secondary | ICD-10-CM | POA: Diagnosis not present

## 2016-04-13 ENCOUNTER — Ambulatory Visit (HOSPITAL_BASED_OUTPATIENT_CLINIC_OR_DEPARTMENT_OTHER): Payer: Self-pay | Admitting: Nurse Practitioner

## 2016-04-13 DIAGNOSIS — R918 Other nonspecific abnormal finding of lung field: Secondary | ICD-10-CM

## 2016-04-15 ENCOUNTER — Ambulatory Visit (HOSPITAL_BASED_OUTPATIENT_CLINIC_OR_DEPARTMENT_OTHER): Payer: PRIVATE HEALTH INSURANCE

## 2016-04-16 ENCOUNTER — Ambulatory Visit (HOSPITAL_BASED_OUTPATIENT_CLINIC_OR_DEPARTMENT_OTHER): Payer: PRIVATE HEALTH INSURANCE | Admitting: Internal Medicine

## 2016-04-16 DIAGNOSIS — F4320 Adjustment disorder, unspecified: Secondary | ICD-10-CM | POA: Diagnosis not present

## 2016-04-21 DIAGNOSIS — F4320 Adjustment disorder, unspecified: Secondary | ICD-10-CM | POA: Diagnosis not present

## 2016-04-22 ENCOUNTER — Other Ambulatory Visit (HOSPITAL_BASED_OUTPATIENT_CLINIC_OR_DEPARTMENT_OTHER): Payer: Self-pay | Admitting: Internal Medicine

## 2016-04-22 MED ORDER — MUPIROCIN 2 % EX OINT
TOPICAL_OINTMENT | Freq: Three times a day (TID) | CUTANEOUS | 5 refills | Status: AC
Start: 2016-04-22 — End: 2017-04-22

## 2016-04-22 NOTE — Progress Notes (Signed)
PER Pharmacy, Julie Freeman is a 40 year old female has requested a refill of mupriocin 2% ointment.      Last Office Visit: 01/16/16 with Deliah Goody  Last Physical Exam: 03/23/16      Other Med Adult:  Most Recent BP Reading(s)  03/23/16 : 112/70          Cholesterol (mg/dL)   Date Value   03/23/2016 148   ----------    LOW DENSITY LIPOPROTEIN DIRECT (mg/dL)   Date Value   03/23/2016 61   ----------    HIGH DENSITY LIPOPROTEIN (mg/dL)   Date Value   03/23/2016 72   ----------    TRIGLYCERIDES (mg/dL)   Date Value   03/23/2016 76   ----------        THYROID SCREEN TSH REFLEX FT4 (uIU/mL)   Date Value   03/23/2016 4.260 (H)   ----------      No results found for: TSH      HEMOGLOBIN A1C (%)   Date Value   03/23/2016 5.0   ----------        INR (no units)   Date Value   10/10/2010 1.1 (L)   ----------      SODIUM (mmol/L)   Date Value   07/13/2013 139   ----------      POTASSIUM (mmol/L)   Date Value   07/13/2013 4.1   ----------          CREATININE (mg/dL)   Date Value   01/16/2016 0.3 (L)   ----------    Documented patient preferred pharmacies:    [id number] - [place], [place] [address]  Phone: [phone] Fax: [phone]

## 2016-04-27 DIAGNOSIS — R918 Other nonspecific abnormal finding of lung field: Secondary | ICD-10-CM | POA: Diagnosis not present

## 2016-04-27 DIAGNOSIS — M3390 Dermatopolymyositis, unspecified, organ involvement unspecified: Secondary | ICD-10-CM | POA: Diagnosis not present

## 2016-04-27 DIAGNOSIS — R599 Enlarged lymph nodes, unspecified: Secondary | ICD-10-CM | POA: Diagnosis not present

## 2016-04-27 DIAGNOSIS — J984 Other disorders of lung: Secondary | ICD-10-CM | POA: Diagnosis not present

## 2016-04-27 DIAGNOSIS — R590 Localized enlarged lymph nodes: Secondary | ICD-10-CM | POA: Diagnosis not present

## 2016-04-27 LAB — CT CHEST W CONTRAST

## 2016-04-27 MED ORDER — SODIUM CHLORIDE 0.9 % IV BOLUS
60.00 mL | Freq: Once | INTRAVENOUS | Status: AC
Start: 2016-04-27 — End: 2016-04-27
  Administered 2016-04-27: 60 mL

## 2016-04-27 MED ORDER — IOHEXOL 300 MG/ML IJ SOLN
70.00 mL | Freq: Once | INTRAMUSCULAR | Status: AC
Start: 2016-04-27 — End: 2016-04-27
  Administered 2016-04-27: 70 mL via INTRAVENOUS

## 2016-04-27 NOTE — Addendum Note (Signed)
Addended by: Humberto Seals on: 04/27/2016 11:29 AM     Modules accepted: Orders, SmartSet

## 2016-04-27 NOTE — Addendum Note (Signed)
Addended by: Remi Haggard on: 04/27/2016 10:21 AM     Modules accepted: Orders

## 2016-04-28 ENCOUNTER — Encounter (HOSPITAL_BASED_OUTPATIENT_CLINIC_OR_DEPARTMENT_OTHER): Payer: Self-pay | Admitting: Nurse Practitioner

## 2016-04-30 DIAGNOSIS — F4320 Adjustment disorder, unspecified: Secondary | ICD-10-CM | POA: Diagnosis not present

## 2016-05-04 ENCOUNTER — Ambulatory Visit (HOSPITAL_BASED_OUTPATIENT_CLINIC_OR_DEPARTMENT_OTHER): Payer: PRIVATE HEALTH INSURANCE

## 2016-05-04 ENCOUNTER — Ambulatory Visit (HOSPITAL_BASED_OUTPATIENT_CLINIC_OR_DEPARTMENT_OTHER): Payer: Self-pay | Admitting: Nurse Practitioner

## 2016-05-04 DIAGNOSIS — M3390 Dermatopolymyositis, unspecified, organ involvement unspecified: Secondary | ICD-10-CM | POA: Diagnosis not present

## 2016-05-04 DIAGNOSIS — J45909 Unspecified asthma, uncomplicated: Secondary | ICD-10-CM

## 2016-05-04 MED ORDER — ALBUTEROL SULFATE HFA 108 (90 BASE) MCG/ACT IN AERS
4.00 | INHALATION_SPRAY | Freq: Once | RESPIRATORY_TRACT | Status: AC
Start: 2016-05-04 — End: 2016-05-04
  Administered 2016-05-04: 4 via RESPIRATORY_TRACT

## 2016-05-04 NOTE — Addendum Note (Signed)
Addended by: Ancil Boozer on: 05/04/2016 01:17 PM     Modules accepted: Orders, SmartSet

## 2016-05-05 ENCOUNTER — Other Ambulatory Visit (HOSPITAL_BASED_OUTPATIENT_CLINIC_OR_DEPARTMENT_OTHER): Payer: Self-pay | Admitting: Internal Medicine

## 2016-05-05 DIAGNOSIS — M3390 Dermatopolymyositis, unspecified, organ involvement unspecified: Secondary | ICD-10-CM

## 2016-05-05 DIAGNOSIS — M3313 Other dermatomyositis without myopathy: Secondary | ICD-10-CM

## 2016-05-05 NOTE — Progress Notes (Signed)
PER Pharmacy, Sol Beaber is a 40 year old female has requested a refill of Mycophenolate 500mg  and Methotrexate 2.5mg .      Last Office Visit: 01/16/16  Last Physical Exam: 03/23/16      Other Med Adult:  Most Recent BP Reading(s)  03/23/16 : 112/70          Cholesterol (mg/dL)   Date Value   03/23/2016 148   ----------    LOW DENSITY LIPOPROTEIN DIRECT (mg/dL)   Date Value   03/23/2016 61   ----------    HIGH DENSITY LIPOPROTEIN (mg/dL)   Date Value   03/23/2016 72   ----------    TRIGLYCERIDES (mg/dL)   Date Value   03/23/2016 76   ----------        THYROID SCREEN TSH REFLEX FT4 (uIU/mL)   Date Value   03/23/2016 4.260 (H)   ----------      No results found for: TSH      HEMOGLOBIN A1C (%)   Date Value   03/23/2016 5.0   ----------        INR (no units)   Date Value   10/10/2010 1.1 (L)   ----------      SODIUM (mmol/L)   Date Value   07/13/2013 139   ----------      POTASSIUM (mmol/L)   Date Value   07/13/2013 4.1   ----------          CREATININE (mg/dL)   Date Value   01/16/2016 0.3 (L)   ----------      Documented patient preferred pharmacies:    [id number] - [place], [place] [address]  Phone: [phone] Fax: [phone]

## 2016-05-07 DIAGNOSIS — F4320 Adjustment disorder, unspecified: Secondary | ICD-10-CM | POA: Diagnosis not present

## 2016-05-08 ENCOUNTER — Telehealth (HOSPITAL_BASED_OUTPATIENT_CLINIC_OR_DEPARTMENT_OTHER): Payer: Self-pay | Admitting: Nurse Practitioner

## 2016-05-08 NOTE — Progress Notes (Signed)
Called  Patient left message for patient to call me back with information for chiropractor . If patient calls back please forwards information to me. Thanks. Guido Sander, 05/08/2016, 3:29 PM

## 2016-05-11 ENCOUNTER — Ambulatory Visit (HOSPITAL_BASED_OUTPATIENT_CLINIC_OR_DEPARTMENT_OTHER): Payer: PRIVATE HEALTH INSURANCE | Admitting: Internal Medicine

## 2016-05-12 ENCOUNTER — Ambulatory Visit (HOSPITAL_BASED_OUTPATIENT_CLINIC_OR_DEPARTMENT_OTHER): Payer: PRIVATE HEALTH INSURANCE | Admitting: Ophthalmology

## 2016-05-12 NOTE — Addendum Note (Signed)
Addended by: Saundra Shelling on: 05/12/2016 10:10 AM     Modules accepted: Orders

## 2016-05-12 NOTE — Pulmonary function test (Addendum)
[facility]  PULMONARY FUNCTION LABORATORY    Pulmonary Function Tests Interpretation    Valda Christenson is a 40 year old female who had a pulmonary function test performed at the [facility] Pulmonary Function Laboratory.    Ordering provider Dr. Erick Alley    Indication asthma    See me to section of Epic for full test    ATS acceptability and reproducibility were met with the exception of prebronchodilator FVC and FEV1    Spirometry. FEV1 to FVC is intact at 86%. FEV1 is 2.71 or 90% predicted FVC is 3.14 or 86% of predicted. There is a nonsignificant response to inhaled bronchodilator. Flows at mid to low lung volumes are normal to do improve somewhat with inhaled bronchodilator. Peak expiratory flow is 5.5 for 82% predicted, prebronchodilator.    Lung volumes. Vital capacity 3. 10/15/80 percent. The TLC is 5.33 or 91% predicted residual volume is 2.18 or 111% of predicted. RV to TLC ratio is elevated at 41%, where as 32% is normal.    Diffusion. DLCO is intact 85% predicted and rises to 98% predicted when accounting for alveolar volume.    Oximetry. Resting room air saturation is 99 with heart rate of 79. Pulse 500 feet of ambulation to 100% with a heart rate of 96.    Compared to test in January 2016 there is some apparent decrement in function although oximetry is improved.    Impression. Spirometry is technically normal but may suggest a small component of bronchodilator responsiveness versus improvement during testing. Lung volumes are intact although there is slight evidence of air trapping shown by the increased residual volume to total lung capacity ratio. Diffusion capacity is intact. His oximetry is intact. Compared with 2016 there is slight decrement in spirometry and DLCO, of unclear significance.    Saundra Shelling, MD, 05/12/2016, 10:05 AM

## 2016-05-14 DIAGNOSIS — F4320 Adjustment disorder, unspecified: Secondary | ICD-10-CM | POA: Diagnosis not present

## 2016-05-15 ENCOUNTER — Ambulatory Visit (HOSPITAL_BASED_OUTPATIENT_CLINIC_OR_DEPARTMENT_OTHER): Payer: PRIVATE HEALTH INSURANCE | Admitting: Ophthalmology

## 2016-05-21 DIAGNOSIS — F4320 Adjustment disorder, unspecified: Secondary | ICD-10-CM | POA: Diagnosis not present

## 2016-05-30 DIAGNOSIS — F4320 Adjustment disorder, unspecified: Secondary | ICD-10-CM | POA: Diagnosis not present

## 2016-06-03 ENCOUNTER — Encounter (HOSPITAL_BASED_OUTPATIENT_CLINIC_OR_DEPARTMENT_OTHER): Payer: Self-pay

## 2016-06-03 ENCOUNTER — Other Ambulatory Visit (HOSPITAL_BASED_OUTPATIENT_CLINIC_OR_DEPARTMENT_OTHER): Payer: Self-pay | Admitting: Internal Medicine

## 2016-06-03 NOTE — Progress Notes (Signed)
PER Pharmacy, Julie Freeman is a 40 year old female has requested a refill of PLAQUENIL  Last Office Visit: 01/16/16  Last Physical Exam: 03/23/16      Other Med Adult:  Most Recent BP Reading(s)  03/23/16 : 112/70              Cholesterol (mg/dL)   Date Value   03/23/2016 148   ----------        LOW DENSITY LIPOPROTEIN DIRECT (mg/dL)   Date Value   03/23/2016 61   ----------        HIGH DENSITY LIPOPROTEIN (mg/dL)   Date Value   03/23/2016 72   ----------        TRIGLYCERIDES (mg/dL)   Date Value   03/23/2016 76   ----------            THYROID SCREEN TSH REFLEX FT4 (uIU/mL)   Date Value   03/23/2016 4.260 (H)   ----------      No results found for: TSH          HEMOGLOBIN A1C (%)   Date Value   03/23/2016 5.0   ----------            INR (no units)   Date Value   10/10/2010 1.1 (L)   ----------          SODIUM (mmol/L)   Date Value   07/13/2013 139   ----------          POTASSIUM (mmol/L)   Date Value   07/13/2013 4.1   ----------              CREATININE (mg/dL)   Date Value   01/16/2016 0.3 (L)   ----------      Documented patient preferred pharmacies:    [id number] [place], [place] [address]  Phone: [phone] Fax: [phone]

## 2016-06-03 NOTE — Progress Notes (Signed)
.      Pt requests that PCP complete [name] Form (1 page) for new employment. Pt requests a confirmation call when form is complete and for completed form to be faxed to [phone]. Pt can be reached at [phone] with any questions. Pt is aware of timeframe for requests of this kind and agrees.       Facesheet affixed to form and form placed in bin for AC2.     Julie Freeman, 06/03/2016, 2:21 PM

## 2016-06-06 DIAGNOSIS — F4320 Adjustment disorder, unspecified: Secondary | ICD-10-CM | POA: Diagnosis not present

## 2016-06-11 NOTE — Progress Notes (Signed)
[employer] health record form reviewed and left for PCP to complete.   Julie Freeman, 06/11/2016, 2:32 PM

## 2016-06-16 ENCOUNTER — Telehealth (HOSPITAL_BASED_OUTPATIENT_CLINIC_OR_DEPARTMENT_OTHER): Payer: Self-pay | Admitting: Internal Medicine

## 2016-06-16 NOTE — Progress Notes (Signed)
I called to offer her an appointment for today.  She did not pick up her telephone.

## 2016-06-18 ENCOUNTER — Telehealth (HOSPITAL_BASED_OUTPATIENT_CLINIC_OR_DEPARTMENT_OTHER): Payer: Self-pay

## 2016-06-18 ENCOUNTER — Other Ambulatory Visit (HOSPITAL_BASED_OUTPATIENT_CLINIC_OR_DEPARTMENT_OTHER): Payer: Self-pay | Admitting: Family

## 2016-06-18 DIAGNOSIS — Z0184 Encounter for antibody response examination: Secondary | ICD-10-CM

## 2016-06-18 NOTE — Telephone Encounter (Signed)
-----  Message from La Grulla Verdieu sent at 06/16/2016 11:16 AM EDT -----  Joen Laura,   Can you please let patient know that I've receviced the employee health record form to be completed? She either needs to come in for MMR titers or vaccines (series of 2 shots).    Thanks, Soimise

## 2016-06-18 NOTE — Progress Notes (Signed)
Spoke with pt regarding need for MMR titers for forms to be completed for school.   Lab appt scheduled for 9/12 at 7:30pm.

## 2016-06-22 ENCOUNTER — Ambulatory Visit (HOSPITAL_BASED_OUTPATIENT_CLINIC_OR_DEPARTMENT_OTHER): Payer: PRIVATE HEALTH INSURANCE | Admitting: Nurse Practitioner

## 2016-06-24 ENCOUNTER — Ambulatory Visit (HOSPITAL_BASED_OUTPATIENT_CLINIC_OR_DEPARTMENT_OTHER): Payer: PRIVATE HEALTH INSURANCE

## 2016-06-24 DIAGNOSIS — Z0184 Encounter for antibody response examination: Secondary | ICD-10-CM

## 2016-06-24 NOTE — Progress Notes (Signed)
Labs drawn.  Julie Freeman, 06/24/2016, 7:19 PM

## 2016-06-26 ENCOUNTER — Encounter (HOSPITAL_BASED_OUTPATIENT_CLINIC_OR_DEPARTMENT_OTHER): Payer: Self-pay | Admitting: Nurse Practitioner

## 2016-06-26 LAB — RUBEOLA IGG ANTIBODY: RUBEOLA IGG ANTIBODY: >3.5 INDEX

## 2016-06-26 LAB — MUMPS IGG ANTIBODY: MUMPS IGG ANTIBODY: 2.8 INDEX

## 2016-06-26 LAB — RUBELLA IGG ANTIBODY: RUBELLA: 32.4 IU/mL

## 2016-06-27 DIAGNOSIS — F4320 Adjustment disorder, unspecified: Secondary | ICD-10-CM | POA: Diagnosis not present

## 2016-06-29 ENCOUNTER — Ambulatory Visit (HOSPITAL_BASED_OUTPATIENT_CLINIC_OR_DEPARTMENT_OTHER): Payer: PRIVATE HEALTH INSURANCE | Admitting: Ophthalmology

## 2016-07-01 ENCOUNTER — Telehealth (HOSPITAL_BASED_OUTPATIENT_CLINIC_OR_DEPARTMENT_OTHER): Payer: Self-pay | Admitting: Ambulatory Care

## 2016-07-01 NOTE — Progress Notes (Signed)
TC to pt.   Reviewed letter from [facility] 06/26/16 re MMR results.  Pt asking for the status of her form that needs to completed and faxed to work w/MMR results.  States left w/pcp at last visit.  Sent to pcp and [place], ACII.  Pt requesting a call w/status of paperwork.

## 2016-07-01 NOTE — Progress Notes (Signed)
[name] employee health record form has been completed and signed by provider, faxed to employer FAX# [phone] scanned into media. Original mailed to pt's residence on file for her records. patient notified    Shawna Orleans, 07/01/2016, 12:20 PM

## 2016-07-01 NOTE — Telephone Encounter (Signed)
-----   Message from [facility] sent at 07/01/2016  9:05 AM EDT -----  Regarding: lab results  Contact: [phone]  [place] [id number], 40 year old, female, Telephone Information:  Home Phone      [phone]  Work Phone      Not on file.  Mobile          [phone]      Patient's Preferred Pharmacy:     [id number] [place], [place] [address]  Phone: [phone] Fax: [phone]    [id number] [place], [place]  Phone: [phone] Fax: [phone]      CONFIRMED TODAY: Yes    CALL BACK NUMBER: [phone]  Best time to call back:   Cell phone:   Other phone:    Available times:    Patient's language of care: English    Patient does not need an interpreter.    Patient's PCP: Bryson Ha, FNP    Person calling on behalf of patient: Patient (self)    Calls today for test result(s).done a week ago.

## 2016-07-04 DIAGNOSIS — F4320 Adjustment disorder, unspecified: Secondary | ICD-10-CM | POA: Diagnosis not present

## 2016-07-09 ENCOUNTER — Other Ambulatory Visit (HOSPITAL_BASED_OUTPATIENT_CLINIC_OR_DEPARTMENT_OTHER): Payer: Self-pay | Admitting: Internal Medicine

## 2016-07-09 DIAGNOSIS — M3390 Dermatopolymyositis, unspecified, organ involvement unspecified: Secondary | ICD-10-CM

## 2016-07-09 NOTE — Progress Notes (Signed)
PER Pharmacy, Julie Freeman is a 40 year old female has requested a refill of Methotrexate 2.5mg .      Last Office Visit: 01/16/16     With: Deliah Goody  Last Physical Exam: 03/23/16      Other Med Adult:  Most Recent BP Reading(s)  03/23/16 : 112/70          Cholesterol (mg/dL)   Date Value   03/23/2016 148   ----------    LOW DENSITY LIPOPROTEIN DIRECT (mg/dL)   Date Value   03/23/2016 61   ----------    HIGH DENSITY LIPOPROTEIN (mg/dL)   Date Value   03/23/2016 72   ----------    TRIGLYCERIDES (mg/dL)   Date Value   03/23/2016 76   ----------        THYROID SCREEN TSH REFLEX FT4 (uIU/mL)   Date Value   03/23/2016 4.260 (H)   ----------      No results found for: TSH      HEMOGLOBIN A1C (%)   Date Value   03/23/2016 5.0   ----------        INR (no units)   Date Value   10/10/2010 1.1 (L)   ----------      SODIUM (mmol/L)   Date Value   07/13/2013 139   ----------      POTASSIUM (mmol/L)   Date Value   07/13/2013 4.1   ----------          CREATININE (mg/dL)   Date Value   01/16/2016 0.3 (L)   ----------      Documented patient preferred pharmacies:    [id number] [place], [place] [facility] [address]  Phone: [phone] Fax: [phone]

## 2016-07-11 DIAGNOSIS — F4320 Adjustment disorder, unspecified: Secondary | ICD-10-CM | POA: Diagnosis not present

## 2016-07-27 ENCOUNTER — Ambulatory Visit (HOSPITAL_BASED_OUTPATIENT_CLINIC_OR_DEPARTMENT_OTHER): Payer: PRIVATE HEALTH INSURANCE | Admitting: Internal Medicine

## 2016-08-05 ENCOUNTER — Encounter (HOSPITAL_BASED_OUTPATIENT_CLINIC_OR_DEPARTMENT_OTHER): Payer: Self-pay | Admitting: Internal Medicine

## 2016-08-05 ENCOUNTER — Other Ambulatory Visit (HOSPITAL_BASED_OUTPATIENT_CLINIC_OR_DEPARTMENT_OTHER): Payer: Self-pay | Admitting: Internal Medicine

## 2016-08-05 ENCOUNTER — Other Ambulatory Visit (HOSPITAL_BASED_OUTPATIENT_CLINIC_OR_DEPARTMENT_OTHER): Payer: Self-pay | Admitting: Pulmonary Disease

## 2016-08-05 DIAGNOSIS — M3313 Other dermatomyositis without myopathy: Secondary | ICD-10-CM

## 2016-08-05 DIAGNOSIS — M3390 Dermatopolymyositis, unspecified, organ involvement unspecified: Secondary | ICD-10-CM

## 2016-08-05 NOTE — Progress Notes (Signed)
PER Pharmacy, Julie Freeman is a 40 year old female has requested a refill of MYCOPHENOLATE      Last Office Visit: 01/16/16     With: Deliah Goody  Last Physical Exam: 03/23/16      Other Med Adult:  Most Recent BP Reading(s)  03/23/16 : 112/70              Cholesterol (mg/dL)   Date Value   03/23/2016 148   ----------        LOW DENSITY LIPOPROTEIN DIRECT (mg/dL)   Date Value   03/23/2016 61   ----------        HIGH DENSITY LIPOPROTEIN (mg/dL)   Date Value   03/23/2016 72   ----------        TRIGLYCERIDES (mg/dL)   Date Value   03/23/2016 76   ----------            THYROID SCREEN TSH REFLEX FT4 (uIU/mL)   Date Value   03/23/2016 4.260 (H)   ----------      No results found for: TSH          HEMOGLOBIN A1C (%)   Date Value   03/23/2016 5.0   ----------            INR (no units)   Date Value   10/10/2010 1.1 (L)   ----------          SODIUM (mmol/L)   Date Value   07/13/2013 139   ----------          POTASSIUM (mmol/L)   Date Value   07/13/2013 4.1   ----------              CREATININE (mg/dL)   Date Value   01/16/2016 0.3 (L)   ----------      Documented patient preferred pharmacies:    [id number] [place], [place] - [address] AT [address]  Phone: [phone] Fax: [phone]

## 2016-08-05 NOTE — Progress Notes (Signed)
PER Pharmacy, Julie Freeman is a 40 year old female has requested a refill of FLONASE.      Last Office Visit: 06/19/15 with [facility]  Last Physical Exam: 03/23/16      Other Med Adult:  Most Recent BP Reading(s)  03/23/16 : 112/70          Cholesterol (mg/dL)   Date Value   03/23/2016 148   ----------    LOW DENSITY LIPOPROTEIN DIRECT (mg/dL)   Date Value   03/23/2016 61   ----------    HIGH DENSITY LIPOPROTEIN (mg/dL)   Date Value   03/23/2016 72   ----------    TRIGLYCERIDES (mg/dL)   Date Value   03/23/2016 76   ----------        THYROID SCREEN TSH REFLEX FT4 (uIU/mL)   Date Value   03/23/2016 4.260 (H)   ----------      No results found for: TSH      HEMOGLOBIN A1C (%)   Date Value   03/23/2016 5.0   ----------        INR (no units)   Date Value   10/10/2010 1.1 (L)   ----------      SODIUM (mmol/L)   Date Value   07/13/2013 139   ----------      POTASSIUM (mmol/L)   Date Value   07/13/2013 4.1   ----------          CREATININE (mg/dL)   Date Value   01/16/2016 0.3 (L)   ----------      Documented patient preferred pharmacies:    [id number] [place], [place] - [place]  Phone: [phone] Fax: [phone]

## 2016-08-05 NOTE — Progress Notes (Signed)
Labs ordered.

## 2016-08-06 ENCOUNTER — Telehealth (HOSPITAL_BASED_OUTPATIENT_CLINIC_OR_DEPARTMENT_OTHER): Payer: Self-pay | Admitting: Internal Medicine

## 2016-08-06 NOTE — Progress Notes (Signed)
The patient has not been seen since April.    Since her last visit, she was seen by dermatology and rheumatology a positive [facility].    She is now switching her care over to [facility] and [facility].  She will be seeing a new primary care provider, rheumatologist and a dermatologist.    She agrees to come in for labs over the next week so that I can refill prescriptions to tide her over until she sees her new rheumatologist.    I felt that she potentially may have an overlap with scleroderma as the skin of the forearms seem somewhat sclerodermatous, although, she does not have sclerodactyly.  Previous skin biopsy from the upper arm did not show evidence of scleroderma, this was done many years ago.  Recent dermatology referral did not feel that she had a scleroderma overlap.

## 2016-08-10 ENCOUNTER — Ambulatory Visit: Payer: Self-pay | Admitting: Internal Medicine

## 2016-08-10 DIAGNOSIS — M3390 Dermatopolymyositis, unspecified, organ involvement unspecified: Secondary | ICD-10-CM

## 2016-08-10 DIAGNOSIS — M3313 Other dermatomyositis without myopathy: Secondary | ICD-10-CM

## 2016-08-10 LAB — CBC, PLATELET & DIFFERENTIAL
ABSOLUTE BASO COUNT: 0 10*3/uL (ref 0.0–0.1)
ABSOLUTE EOSINOPHIL COUNT: 0.2 10*3/uL (ref 0.0–0.8)
ABSOLUTE IMM GRAN COUNT: 0 10*3/uL (ref 0.00–0.03)
ABSOLUTE LYMPH COUNT: 1.1 10*3/uL (ref 0.6–5.9)
ABSOLUTE MONO COUNT: 0.8 10*3/uL (ref 0.2–1.4)
ABSOLUTE NEUTROPHIL COUNT: 1.7 10*3/uL (ref 1.6–8.3)
BASOPHIL %: 0.5 % (ref 0.0–1.2)
EOSINOPHIL %: 6.1 % (ref 0.0–7.0)
HEMATOCRIT: 35.5 % (ref 34.1–44.9)
HEMOGLOBIN: 11.9 g/dL (ref 11.2–15.7)
IMMATURE GRANULOCYTE %: 0 % (ref 0.0–0.4)
LYMPHOCYTE %: 28 % (ref 15.0–54.0)
MEAN CORP HGB CONC: 33.5 g/dL (ref 31.0–37.0)
MEAN CORPUSCULAR HGB: 32.5 pg (ref 26.0–34.0)
MEAN CORPUSCULAR VOL: 97 fL (ref 80.0–100.0)
MEAN PLATELET VOLUME: 10.3 fL (ref 8.7–12.5)
MONOCYTE %: 19.8 % — ABNORMAL HIGH (ref 4.0–13.0)
NEUTROPHIL %: 45.6 % (ref 40.0–75.0)
PLATELET COUNT: 235 10*3/uL (ref 150–400)
RBC DISTRIBUTION WIDTH STD DEV: 45.1 fL (ref 35.1–46.3)
RBC DISTRIBUTION WIDTH: 12.8 % (ref 11.5–14.3)
RED BLOOD CELL COUNT: 3.66 M/uL — ABNORMAL LOW (ref 3.90–5.20)
WHITE BLOOD CELL COUNT: 3.8 10*3/uL — ABNORMAL LOW (ref 4.0–11.0)

## 2016-08-10 LAB — COMPREHENSIVE METABOLIC PANEL
ALANINE AMINOTRANSFERASE: 33 U/L (ref 12–45)
ALBUMIN: 4 g/dL (ref 3.4–5.0)
ALKALINE PHOSPHATASE: 44 U/L — ABNORMAL LOW (ref 45–117)
ANION GAP: 10 mmol/L (ref 5–15)
ASPARTATE AMINOTRANSFERASE: 44 U/L — ABNORMAL HIGH (ref 8–34)
BILIRUBIN TOTAL: 0.4 mg/dL (ref 0.2–1.0)
BUN (UREA NITROGEN): 13 mg/dL (ref 7–18)
CALCIUM: 8.7 mg/dL (ref 8.5–10.1)
CARBON DIOXIDE: 27 mmol/L (ref 21–32)
CHLORIDE: 102 mmol/L (ref 98–107)
CREATININE: 0.3 mg/dL — ABNORMAL LOW (ref 0.4–1.2)
ESTIMATED GLOMERULAR FILT RATE: >60 mL/min (ref >60)
Glucose Random: 92 mg/dL (ref 74–160)
POTASSIUM: 4.2 mmol/L (ref 3.5–5.1)
SODIUM: 139 mmol/L (ref 136–145)
TOTAL PROTEIN: 8 g/dL (ref 6.4–8.2)

## 2016-08-10 LAB — CREATINE KINASE TOTAL: CREATINE KINASE TOTAL: 322 IU/L (ref 26–192)

## 2016-08-10 LAB — VITAMIN D,25 HYDROXY: VITAMIN D,25 HYDROXY: 24 ng/mL — ABNORMAL LOW (ref 30.0–100.0)

## 2016-08-10 NOTE — Addendum Note (Signed)
Addended by: Janee Morn on: 08/10/2016 02:42 PM     Modules accepted: Orders

## 2016-08-11 ENCOUNTER — Encounter (HOSPITAL_BASED_OUTPATIENT_CLINIC_OR_DEPARTMENT_OTHER): Payer: Self-pay | Admitting: Internal Medicine

## 2016-08-11 DIAGNOSIS — M3390 Dermatopolymyositis, unspecified, organ involvement unspecified: Secondary | ICD-10-CM

## 2016-08-11 DIAGNOSIS — M3313 Other dermatomyositis without myopathy: Secondary | ICD-10-CM

## 2016-08-11 MED ORDER — METHOTREXATE 2.5 MG PO TABS
ORAL_TABLET | ORAL | 2 refills | Status: AC
Start: 2016-08-11 — End: 2016-11-11

## 2016-08-11 MED ORDER — FOLIC ACID 1 MG PO TABS: 2000 ug | tablet | Freq: Every day | ORAL | 2 refills | 0 days | Status: DC

## 2016-08-11 MED ORDER — ERGOCALCIFEROL 50000 UNITS PO CAPS: 50000 [IU] | capsule | ORAL | 2 refills | 0 days | Status: AC

## 2016-08-11 MED ORDER — MYCOPHENOLATE MOFETIL 500 MG PO TABS: tablet | ORAL | 2 refills | 0 days | Status: AC

## 2016-08-11 MED ORDER — MYCOPHENOLATE MOFETIL 500 MG PO TABS
ORAL_TABLET | ORAL | 2 refills | Status: AC
Start: 2016-08-11 — End: 2016-11-11

## 2016-08-11 MED ORDER — METHOTREXATE 2.5 MG PO TABS: tablet | ORAL | 2 refills | 0 days | Status: AC

## 2016-08-11 MED ORDER — FOLIC ACID 1 MG PO TABS
2000.0000 ug | ORAL_TABLET | Freq: Every day | ORAL | 2 refills | Status: DC
Start: 2016-08-11 — End: 2016-10-30

## 2016-08-11 MED ORDER — HYDROXYCHLOROQUINE SULFATE 200 MG PO TABS: 400 mg | tablet | Freq: Every day | ORAL | 2 refills | 0 days | Status: AC

## 2016-08-11 MED ORDER — HYDROXYCHLOROQUINE SULFATE 200 MG PO TABS
400.0000 mg | ORAL_TABLET | Freq: Every day | ORAL | 2 refills | Status: AC
Start: 2016-08-11 — End: 2016-11-11

## 2016-08-11 MED ORDER — ERGOCALCIFEROL 1.25 MG (50000 UT) PO CAPS
50000.00 [IU] | ORAL_CAPSULE | ORAL | 2 refills | Status: AC
Start: 2016-08-11 — End: 2016-11-09

## 2016-08-11 NOTE — Progress Notes (Signed)
There is an error in her note dated January 16, 2016.  Her methotrexate dose was lowered to 6 tablets which is 15 mg a week.    Given that her CK is more elevated, I am going to recommend that she increase the methotrexate back to 20 mg.

## 2016-08-18 DIAGNOSIS — L669 Cicatricial alopecia, unspecified: Secondary | ICD-10-CM | POA: Insufficient documentation

## 2016-09-01 ENCOUNTER — Other Ambulatory Visit (HOSPITAL_BASED_OUTPATIENT_CLINIC_OR_DEPARTMENT_OTHER): Payer: Self-pay | Admitting: Internal Medicine

## 2016-09-01 NOTE — Progress Notes (Signed)
PER Pharmacy, Julie Freeman is a 40 year old female has requested a refill of Mupiron 2 % ointment.      Last Office Visit: 03/23/16 with Dr. Erick Alley  Last Physical Exam: 03/23/16      Other Med Adult:  Most Recent BP Reading(s)  03/23/16 : 112/70          Cholesterol (mg/dL)   Date Value   03/23/2016 148   ----------    LOW DENSITY LIPOPROTEIN DIRECT (mg/dL)   Date Value   03/23/2016 61   ----------    HIGH DENSITY LIPOPROTEIN (mg/dL)   Date Value   03/23/2016 72   ----------    TRIGLYCERIDES (mg/dL)   Date Value   03/23/2016 76   ----------        THYROID SCREEN TSH REFLEX FT4 (uIU/mL)   Date Value   03/23/2016 4.260 (H)   ----------      No results found for: TSH      HEMOGLOBIN A1C (%)   Date Value   03/23/2016 5.0   ----------    No results found for: POCA1C        INR (no units)   Date Value   10/10/2010 1.1 (L)   ----------      SODIUM (mmol/L)   Date Value   08/10/2016 139   ----------      POTASSIUM (mmol/L)   Date Value   08/10/2016 4.2   ----------          CREATININE (mg/dL)   Date Value   08/10/2016 0.3 (L)   ----------      Documented patient preferred pharmacies:    [id number] [place], [place] [address]  Phone: [phone] Fax: [phone]

## 2016-10-30 ENCOUNTER — Other Ambulatory Visit (HOSPITAL_BASED_OUTPATIENT_CLINIC_OR_DEPARTMENT_OTHER): Payer: Self-pay | Admitting: Registered Nurse

## 2016-10-30 ENCOUNTER — Other Ambulatory Visit (HOSPITAL_BASED_OUTPATIENT_CLINIC_OR_DEPARTMENT_OTHER): Payer: Self-pay | Admitting: Internal Medicine

## 2016-10-30 NOTE — Progress Notes (Signed)
Refill request Folic acid:   Dr. Lorane Gell is away from the office and cannot refill this patient's prescriptions at this time. I forwarded to the patient's PCP to see if they would be willing to order the medication for month.

## 2016-10-30 NOTE — Progress Notes (Signed)
PER Pharmacy, Julie Freeman is a 41 year old female has requested a refill of FOLIC ACID.      Last Office Visit:03/23/2016 with VERDIEU S  Last Physical Exam: 03/23/2016      Other Med Adult:  Most Recent BP Reading(s)  03/23/16 : 112/70          Cholesterol (mg/dL)   Date Value   03/23/2016 148   ----------    LOW DENSITY LIPOPROTEIN DIRECT (mg/dL)   Date Value   03/23/2016 61   ----------    HIGH DENSITY LIPOPROTEIN (mg/dL)   Date Value   03/23/2016 72   ----------    TRIGLYCERIDES (mg/dL)   Date Value   03/23/2016 76   ----------        THYROID SCREEN TSH REFLEX FT4 (uIU/mL)   Date Value   03/23/2016 4.260 (H)   ----------      No results found for: TSH      HEMOGLOBIN A1C (%)   Date Value   03/23/2016 5.0   ----------    No results found for: POCA1C        INR (no units)   Date Value   10/10/2010 1.1 (L)   ----------      SODIUM (mmol/L)   Date Value   08/10/2016 139   ----------      POTASSIUM (mmol/L)   Date Value   08/10/2016 4.2   ----------          CREATININE (mg/dL)   Date Value   08/10/2016 0.3 (L)   ----------      Documented patient preferred pharmacies:    [id number] [place], [place] - [place]. AT [place]  Phone: [phone] Fax: [phone]

## 2016-11-02 MED ORDER — FOLIC ACID 1 MG PO TABS: 2000 ug | tablet | Freq: Every day | ORAL | 2 refills | 0 days | Status: AC

## 2016-11-02 MED ORDER — FOLIC ACID 1 MG PO TABS
2000.0000 ug | ORAL_TABLET | Freq: Every day | ORAL | 2 refills | Status: AC
Start: 2016-11-02 — End: 2017-11-02

## 2016-11-10 NOTE — Progress Notes (Signed)
The patient is transferred her care.

## 2016-11-30 ENCOUNTER — Other Ambulatory Visit (HOSPITAL_BASED_OUTPATIENT_CLINIC_OR_DEPARTMENT_OTHER): Payer: Self-pay | Admitting: Internal Medicine

## 2016-11-30 NOTE — Progress Notes (Signed)
PER Pharmacy, Julie Freeman is a 41 year old female has requested a refill of Hydroxychloroquine 200mg  and Mycophenylate 500mg .      Last Office Visit:03/23/16   Last Physical Exam: 03/23/16        Other Med Adult:  Most Recent BP Reading(s)  03/23/16 : 112/70          Cholesterol (mg/dL)   Date Value   03/23/2016 148   ----------    LOW DENSITY LIPOPROTEIN DIRECT (mg/dL)   Date Value   03/23/2016 61   ----------    HIGH DENSITY LIPOPROTEIN (mg/dL)   Date Value   03/23/2016 72   ----------    TRIGLYCERIDES (mg/dL)   Date Value   03/23/2016 76   ----------        THYROID SCREEN TSH REFLEX FT4 (uIU/mL)   Date Value   03/23/2016 4.260 (H)   ----------      No results found for: TSH      HEMOGLOBIN A1C (%)   Date Value   03/23/2016 5.0   ----------    No results found for: POCA1C        INR (no units)   Date Value   10/10/2010 1.1 (L)   ----------      SODIUM (mmol/L)   Date Value   08/10/2016 139   ----------      POTASSIUM (mmol/L)   Date Value   08/10/2016 4.2   ----------          CREATININE (mg/dL)   Date Value   08/10/2016 0.3 (L)   ----------      Documented patient preferred pharmacies:    [id number] [place], [place] - [address]. AT [place]  Phone: [phone] Fax: [phone]

## 2016-12-02 NOTE — Progress Notes (Signed)
The patient has transferred care to another rheumatologist.  Prescriptions denied.

## 2017-09-02 ENCOUNTER — Other Ambulatory Visit (HOSPITAL_BASED_OUTPATIENT_CLINIC_OR_DEPARTMENT_OTHER): Payer: Self-pay | Admitting: Pulmonary Disease

## 2017-09-03 NOTE — Progress Notes (Signed)
PER Pharmacy, Julie Freeman is a 41 year old female has requested a refill of flonase.      Last Office Visit: 03/23/2016 with Durenda Guthrie  Last Physical Exam: 03/23/2016      Other Med Adult:  Most Recent BP Reading(s)  03/23/16 : 112/70        Cholesterol (mg/dL)   Date Value   03/23/2016 148     LOW DENSITY LIPOPROTEIN DIRECT (mg/dL)   Date Value   03/23/2016 61     HIGH DENSITY LIPOPROTEIN (mg/dL)   Date Value   03/23/2016 72     TRIGLYCERIDES (mg/dL)   Date Value   03/23/2016 76         THYROID SCREEN TSH REFLEX FT4 (uIU/mL)   Date Value   03/23/2016 4.260 (H)         No results found for: TSH    HEMOGLOBIN A1C (%)   Date Value   03/23/2016 5.0       No results found for: POCA1C      INR (no units)   Date Value   10/10/2010 1.1 (L)       SODIUM (mmol/L)   Date Value   08/10/2016 139       POTASSIUM (mmol/L)   Date Value   08/10/2016 4.2           CREATININE (mg/dL)   Date Value   08/10/2016 0.3 (L)       Documented patient preferred pharmacies:    [id number] [place], [place] - [address] AT [address]  Phone: [phone] Fax: [phone]

## 2017-09-14 ENCOUNTER — Telehealth (HOSPITAL_BASED_OUTPATIENT_CLINIC_OR_DEPARTMENT_OTHER): Payer: Self-pay | Admitting: Nurse Practitioner

## 2017-09-14 NOTE — Progress Notes (Signed)
STEPHANIE at [place] transferred active refills of [id number] per patient request.

## 2017-09-14 NOTE — Telephone Encounter (Signed)
-----   Message from Kathryne Hitch sent at 09/14/2017 11:24 AM EST -----  Regarding: refill  Contact: [phone]  Julie Freeman [id number], 41 year old, female    Calls today:  Refill    !! Before starting refill request, check EPIC to see if encounter for this medication already exists !!    (May list multiple medications in this section)  Medicine Name: fluticasone (FLONASE) 50 MCG/ACT nasal spray       Sig: 1 spray by Each Nostril route daily Needs f/u appt after this refill           Dosage:   Frequency (how many pills, how many times a day):   Number of pills left:     Documented patient preferred pharmacies:      [facility] [place] [facility] [address] [place] [place]  Phone: [phone] Fax: [phone]  Person calling on behalf of patient: Pharmacy    CALL BACK NUMBER: [phone]  Best time to call back:   Cell phone:   Other phone:    Patient's language of care: English    Patient does not need an interpreter.    Patient's PCP: Bryson Ha, FNP

## 2019-12-28 ENCOUNTER — Other Ambulatory Visit: Payer: Self-pay

## 2019-12-28 ENCOUNTER — Encounter (HOSPITAL_COMMUNITY): Payer: Self-pay

## 2019-12-28 ENCOUNTER — Emergency Department (HOSPITAL_COMMUNITY)
Admission: EM | Admit: 2019-12-28 | Discharge: 2019-12-28 | Disposition: A | Discharge status: Home / Self Care | Payer: Self-pay | Attending: Emergency Medicine | Admitting: Emergency Medicine

## 2019-12-28 ENCOUNTER — Emergency Department (HOSPITAL_BASED_OUTPATIENT_CLINIC_OR_DEPARTMENT_OTHER): Payer: Self-pay

## 2019-12-28 DIAGNOSIS — L03115 Cellulitis of right lower limb: Secondary | ICD-10-CM | POA: Insufficient documentation

## 2019-12-28 DIAGNOSIS — I10 Essential (primary) hypertension: Secondary | ICD-10-CM | POA: Insufficient documentation

## 2019-12-28 DIAGNOSIS — J45909 Unspecified asthma, uncomplicated: Secondary | ICD-10-CM | POA: Insufficient documentation

## 2019-12-28 DIAGNOSIS — M7989 Other specified soft tissue disorders: Secondary | ICD-10-CM

## 2019-12-28 DIAGNOSIS — M79671 Pain in right foot: Secondary | ICD-10-CM

## 2019-12-28 DIAGNOSIS — R2243 Localized swelling, mass and lump, lower limb, bilateral: Secondary | ICD-10-CM | POA: Insufficient documentation

## 2019-12-28 HISTORY — DX: Essential (primary) hypertension: I10

## 2019-12-28 HISTORY — DX: Unspecified asthma, uncomplicated: J45.909

## 2019-12-28 LAB — CBC WITH DIFFERENTIAL/PLATELET
Abs Immature Granulocytes: 0.03 10*3/uL (ref 0.00–0.07)
Basophils Absolute: 0 10*3/uL (ref 0.0–0.1)
Basophils Relative: 0 %
Eosinophils Absolute: 0.4 10*3/uL (ref 0.0–0.5)
Eosinophils Relative: 5 %
HCT: 35.1 % — ABNORMAL LOW (ref 36.0–46.0)
Hemoglobin: 11.8 g/dL — ABNORMAL LOW (ref 12.0–15.0)
Immature Granulocytes: 0 %
Lymphocytes Relative: 32 %
Lymphs Abs: 2.2 10*3/uL (ref 0.7–4.0)
MCH: 32.5 pg (ref 26.0–34.0)
MCHC: 33.6 g/dL (ref 30.0–36.0)
MCV: 96.7 fL (ref 80.0–100.0)
Monocytes Absolute: 1 10*3/uL (ref 0.1–1.0)
Monocytes Relative: 15 %
Neutro Abs: 3.2 10*3/uL (ref 1.7–7.7)
Neutrophils Relative %: 48 %
Platelets: 299 10*3/uL (ref 150–400)
RBC: 3.63 MIL/uL — ABNORMAL LOW (ref 3.87–5.11)
RDW: 12 % (ref 11.5–15.5)
WBC: 6.8 10*3/uL (ref 4.0–10.5)
nRBC: 0 % (ref 0.0–0.2)

## 2019-12-28 LAB — BASIC METABOLIC PANEL
Anion gap: 9 (ref 5–15)
BUN: 10 mg/dL (ref 6–20)
CO2: 24 mmol/L (ref 22–32)
Calcium: 9.1 mg/dL (ref 8.9–10.3)
Chloride: 103 mmol/L (ref 98–111)
Creatinine, Ser: 0.31 mg/dL — ABNORMAL LOW (ref 0.44–1.00)
GFR calc Af Amer: >60 mL/min (ref >60)
GFR calc non Af Amer: >60 mL/min (ref >60)
Glucose, Bld: 98 mg/dL (ref 70–99)
Potassium: 3.7 mmol/L (ref 3.5–5.1)
Sodium: 136 mmol/L (ref 135–145)

## 2019-12-28 MED ORDER — CEPHALEXIN 250 MG PO CAPS
500.0000 mg | ORAL_CAPSULE | Freq: Once | ORAL | Status: AC
  Administered 2019-12-28: 22:00:00 500 mg via ORAL
  Filled 2019-12-28: qty 2

## 2019-12-28 MED ORDER — CEPHALEXIN 500 MG PO CAPS: 500.0000 mg | ORAL_CAPSULE | Freq: Four times a day (QID) | ORAL | 0 refills | Status: AC

## 2019-12-28 NOTE — ED Triage Notes (Signed)
Pt arrives to ED w/ c/o R foot pain and swelling. Pt reports 6/10 pain.

## 2019-12-28 NOTE — ED Provider Notes (Signed)
[facility] EMERGENCY DEPARTMENT Provider Note   CSN: [id number] Arrival date & time: 12/28/19  1609     History Chief Complaint  Patient presents with  . Foot Pain    Patricia Krause is a 44 y.o. female.  HPI Patient is a 44 year old female with a PMH of dermatomyositis presenting to the ED today due to right ankle and foot swelling.  Patient says that she has had chronic swelling in both feet and ankles for years.  Her right lower extremity has always been more swollen than the left.  However, she has noticed increased swelling over the past 1 to 2 weeks in the R ankle.  She has some mild "soreness" associated with the area but has been able to bear weight on the extremity.   She denies fever, chills, nausea, vomiting or diarrhea.      Past Medical History:  Diagnosis Date  . Asthma   . Hypertension     There are no problems to display for this patient.   History reviewed. No pertinent surgical history.   OB History   No obstetric history on file.     History reviewed. No pertinent family history.  Social History   Tobacco Use  . Smoking status: Never Smoker  . Smokeless tobacco: Never Used  Substance Use Topics  . Alcohol use: Never  . Drug use: Never    Home Medications Prior to Admission medications   Not on File    Allergies    Patient has no allergy information on record.  Review of Systems   Review of Systems  Constitutional: Negative for chills and fever.  HENT: Negative for rhinorrhea and sore throat.   Eyes: Negative for pain and visual disturbance.  Respiratory: Negative for cough and shortness of breath.   Cardiovascular: Positive for leg swelling. Negative for chest pain.  Gastrointestinal: Negative for abdominal pain, diarrhea, nausea and vomiting.  Genitourinary: Negative for difficulty urinating and dysuria.  Musculoskeletal: Positive for arthralgias, gait problem and joint swelling. Negative for back pain.  Skin:  Negative for color change and wound.  Neurological: Negative for weakness and headaches.  Psychiatric/Behavioral: Negative for agitation.  All other systems reviewed and are negative.   Physical Exam Updated Vital Signs BP 123/79   Pulse 75   Temp 98.4 F (36.9 C) (Oral)   Resp 18   Ht 5\' 8"  (1.727 m)   Wt 86.2 kg   SpO2 99%   BMI 28.89 kg/m   Physical Exam Vitals and nursing note reviewed.  Constitutional:      General: She is not in acute distress.    Appearance: Normal appearance. She is well-developed. She is not ill-appearing.  HENT:     Head: Normocephalic and atraumatic.     Nose: Nose normal. No congestion or rhinorrhea.     Mouth/Throat:     Mouth: Mucous membranes are moist.     Pharynx: Oropharynx is clear.  Eyes:     Extraocular Movements: Extraocular movements intact.     Conjunctiva/sclera: Conjunctivae normal.     Pupils: Pupils are equal, round, and reactive to light.  Cardiovascular:     Rate and Rhythm: Normal rate and regular rhythm.     Pulses: Normal pulses.     Heart sounds: Normal heart sounds.  Pulmonary:     Effort: Pulmonary effort is normal. No respiratory distress.     Breath sounds: Normal breath sounds.  Abdominal:     General: There is no distension.  Palpations: Abdomen is soft.     Tenderness: There is no abdominal tenderness. There is no guarding or rebound.  Musculoskeletal:        General: Normal range of motion.     Cervical back: Normal range of motion and neck supple.     Right lower leg: Edema present.     Left lower leg: Edema present.     Comments: Bilateral nonpitting edema with right greater than left.  Erythema and warmth around ankle and dorsal aspect of foot.  Patient has full range of motion without significant pain.  Skin:    General: Skin is warm and dry.     Capillary Refill: Capillary refill takes less than 2 seconds.  Neurological:     General: No focal deficit present.     Mental Status: She is alert and  oriented to person, place, and time. Mental status is at baseline.  Psychiatric:        Mood and Affect: Mood normal.     ED Results / Procedures / Treatments   Labs (all labs ordered are listed, but only abnormal results are displayed) Labs Reviewed  CBC WITH DIFFERENTIAL/PLATELET - Abnormal; Notable for the following components:      Result Value   RBC 3.63 (*)    Hemoglobin 11.8 (*)    HCT 35.1 (*)    All other components within normal limits  BASIC METABOLIC PANEL - Abnormal; Notable for the following components:   Creatinine, Ser 0.31 (*)    All other components within normal limits    EKG None  Radiology VAS [place] LOWER EXTREMITY VENOUS (DVT) (ONLY [facility] & [facility])  Result Date: 12/28/2019  Lower Venous DVTStudy Indications: Swelling.  Limitations: Body habitus and poor ultrasound/tissue interface. Comparison Study: no prior Performing Technologist: Abram Sander RVS  Examination Guidelines: A complete evaluation includes B-mode imaging, spectral Doppler, color Doppler, and power Doppler as needed of all accessible portions of each vessel. Bilateral testing is considered an integral part of a complete examination. Limited examinations for reoccurring indications may be performed as noted. The reflux portion of the exam is performed with the patient in reverse Trendelenburg.  +---------+---------------+---------+-----------+----------+------------------+ RIGHT    CompressibilityPhasicitySpontaneityPropertiesThrombus Aging     +---------+---------------+---------+-----------+----------+------------------+ CFV      Full           Yes      Yes                                     +---------+---------------+---------+-----------+----------+------------------+ SFJ      Full                                                            +---------+---------------+---------+-----------+----------+------------------+ FV Prox  Full                                                             +---------+---------------+---------+-----------+----------+------------------+ FV Mid   Full  limited                                                                  visualization      +---------+---------------+---------+-----------+----------+------------------+ FV Distal               Yes      Yes                                     +---------+---------------+---------+-----------+----------+------------------+ PFV      Full                                                            +---------+---------------+---------+-----------+----------+------------------+ POP      Full           Yes      Yes                                     +---------+---------------+---------+-----------+----------+------------------+ PTV                                                   Not visualized     +---------+---------------+---------+-----------+----------+------------------+ PERO                                                  Not visualized     +---------+---------------+---------+-----------+----------+------------------+   +----+---------------+---------+-----------+----------+--------------+ LEFTCompressibilityPhasicitySpontaneityPropertiesThrombus Aging +----+---------------+---------+-----------+----------+--------------+ CFV Full           Yes      Yes                                 +----+---------------+---------+-----------+----------+--------------+     Summary: RIGHT: - There is no evidence of deep vein thrombosis in the lower extremity. However, portions of this examination were limited- see technologist comments above.  - No cystic structure found in the popliteal fossa.  LEFT: - No evidence of common femoral vein obstruction.  *See table(s) above for measurements and observations. Electronically signed by Servando Snare MD on 12/28/2019 at 5:02:28 PM.    Final     Procedures Procedures (including  critical care time)  Medications Ordered in ED Medications - No data to display  ED Course  I have reviewed the triage vital signs and the nursing notes.  Pertinent labs & imaging results that were available during my care of the patient were reviewed by me and considered in my medical decision making (see chart for details).    MDM Rules/Calculators/A&P                     Patient  is a 44 year old female with a PMH of dermatomyositis presenting to the ED today due to right ankle and foot swelling.  On exam, patient has increased nonpitting edema of right foot greater than left with associated warmth and erythema.  Vital signs stable.  Afebrile.  On arrival, patient appears generally well and is displaying no signs of acute distress.  DVT ultrasound collected in triage which was negative for acute occlusion, although the study was limited due to body habitus.  CBC without significant leukocytosis.  BMP unremarkable.  Based off of presentation and lab work, low suspicion for septic arthritis or bony injury.  Given erythema and warmth, presentation concerning for cellulitis in the setting of chronic lymphedema.  Will prescribe Keflex for cellulitis and will provide first dose while in the ED.  Patient's legs wrapped with Ace bandage for swelling.  Patient ambulatory without assistance.  No further work-up or intervention provided while in the ED.  Patient stable for discharge.  Provided strict return precautions.  Patient expresses understanding and is in agreement with plan.  Encouraged patient to follow-up with her PCP in 1 to 2 days for reassessment.  Patient stable at time of discharge.  Patient assessed and evaluated with Dr. Maryan Rued.  Nadeen Landau, MD   Final Clinical Impression(s) / ED Diagnoses Final diagnoses:  Foot pain, right  Cellulitis of right lower extremity    Rx / DC Orders ED Discharge Orders         Ordered    cephALEXin (KEFLEX) 500 MG capsule  4 times daily      12/28/19 2133           Nadeen Landau, MD 12/29/19 0140    Blanchie Dessert, MD 12/29/19 2145

## 2019-12-28 NOTE — ED Notes (Signed)
Patricia Krause [id number] looking for an update

## 2019-12-28 NOTE — ED Notes (Signed)
4in ace wrap applied to B/L feet.

## 2019-12-28 NOTE — Progress Notes (Signed)
Lower extremity venous has been completed.   Preliminary results in CV Proc.   Abram Sander 12/28/2019 5:00 PM

## 2019-12-29 ENCOUNTER — Ambulatory Visit: Payer: Medicaid Other | Attending: Internal Medicine

## 2019-12-29 DIAGNOSIS — Z23 Encounter for immunization: Secondary | ICD-10-CM

## 2019-12-29 NOTE — Progress Notes (Signed)
   Covid-19 Vaccination Clinic  Name:  Shonnie Northrop    MRN: [id number] DOB: [birth year]  12/29/2019  Ms. Harri was observed post Covid-19 immunization for 15 minutes without incident. She was provided with Vaccine Information Sheet and instruction to access the V-Safe system.   Ms. Square was instructed to call 911 with any severe reactions post vaccine: Marland Kitchen Difficulty breathing  . Swelling of face and throat  . A fast heartbeat  . A bad rash all over body  . Dizziness and weakness   Immunizations Administered    Name Date Dose VIS Date Route   Pfizer COVID-19 Vaccine 12/29/2019  3:10 PM 0.3 mL 09/22/2019 Intramuscular   Manufacturer: [place]   Lot: [id number]   [place]: [id number]

## 2020-01-23 ENCOUNTER — Ambulatory Visit: Payer: Medicaid Other | Attending: Internal Medicine

## 2020-01-23 DIAGNOSIS — Z23 Encounter for immunization: Secondary | ICD-10-CM

## 2020-01-23 NOTE — Progress Notes (Signed)
   Covid-19 Vaccination Clinic  Name:  Patricia Krause    MRN: [id number] DOB: 07-26-1976  01/23/2020  [name] was observed post Covid-19 immunization for 15 minutes without incident. She was provided with Vaccine Information Sheet and instruction to access the V-Safe system.   [name] was instructed to call 911 with any severe reactions post vaccine: Marland Kitchen Difficulty breathing  . Swelling of face and throat  . A fast heartbeat  . A bad rash all over body  . Dizziness and weakness   Immunizations Administered    Name Date Dose VIS Date Route   Pfizer COVID-19 Vaccine 01/23/2020  4:20 PM 0.3 mL 09/22/2019 Intramuscular   Manufacturer: [place]   Lot: [id number]   [place]: [id number]

## 2020-12-19 ENCOUNTER — Encounter (HOSPITAL_BASED_OUTPATIENT_CLINIC_OR_DEPARTMENT_OTHER): Payer: Self-pay

## 2021-07-23 ENCOUNTER — Other Ambulatory Visit: Payer: Self-pay

## 2021-07-23 ENCOUNTER — Encounter: Payer: Self-pay | Admitting: Nurse Practitioner

## 2021-07-23 ENCOUNTER — Ambulatory Visit (INDEPENDENT_AMBULATORY_CARE_PROVIDER_SITE_OTHER): Payer: Managed Care, Other (non HMO) | Admitting: Nurse Practitioner

## 2021-07-23 VITALS — BP 122/68 | HR 80 | Temp 98.1°F | Ht 66.8 in | Wt 207.2 lb

## 2021-07-23 DIAGNOSIS — E6609 Other obesity due to excess calories: Secondary | ICD-10-CM

## 2021-07-23 DIAGNOSIS — R7303 Prediabetes: Secondary | ICD-10-CM

## 2021-07-23 DIAGNOSIS — Z23 Encounter for immunization: Secondary | ICD-10-CM | POA: Diagnosis not present

## 2021-07-23 DIAGNOSIS — Z7689 Persons encountering health services in other specified circumstances: Secondary | ICD-10-CM

## 2021-07-23 DIAGNOSIS — M3310 Other dermatopolymyositis, organ involvement unspecified: Secondary | ICD-10-CM

## 2021-07-23 DIAGNOSIS — Z6832 Body mass index (BMI) 32.0-32.9, adult: Secondary | ICD-10-CM

## 2021-07-23 NOTE — Progress Notes (Signed)
This visit occurred during the SARS-CoV-2 public health emergency.  Safety protocols were in place, including screening questions prior to the visit, additional usage of staff PPE, and extensive cleaning of exam room while observing appropriate contact time as indicated for disinfecting solutions.  Subjective:     Patient ID: Patricia Krause , female    DOB: 1975-12-08 , 45 y.o.   MRN: [id number]   Chief Complaint  Patient presents with   Establish Care    HPI  Patient is here to establish care. She would like to discuss issues with swelling in her legs. She would also like to discuss her weight. She has a condition called dermatomyositis. She was diagnosed in 2005. She is from [place] and ever since she has been here she had some doctors in [place] but due to her insurance being changed she is here. She used to see a rheumatologist and dermatologist. She is also working with a nutritionist on her weight. She would also like to get her flu shot.     Past Medical History:  Diagnosis Date   Asthma    Dermatolysis    Hypertension      Family History  Problem Relation Age of Onset   Diabetes Father    Cancer Father    Diabetes Paternal Grandmother    Alzheimer's disease Paternal Grandfather      Current Outpatient Medications:    amLODipine (NORVASC) 5 MG tablet, Take 1 tablet by mouth daily., Disp: , Rfl:    levonorgestrel (MIRENA) 20 MCG/DAY IUD, by Intrauterine route., Disp: , Rfl:    hydrOXYzine (ATARAX/VISTARIL) 25 MG tablet, Take 25 mg by mouth 3 (three) times daily as needed., Disp: , Rfl:    LASIX 40 MG tablet, Take 40 mg by mouth daily., Disp: , Rfl:    Vitamin D, Ergocalciferol, (DRISDOL) 1.25 MG (50000 UNIT) CAPS capsule, Take 50,000 Units by mouth daily as needed., Disp: , Rfl:    Allergies  Allergen Reactions   Cat Hair Extract Hives and Itching   Mixed Ragweed Other (See Comments)   Pollinex-T [Modified Tree Tyrosine Adsorbate] Other (See Comments)     Review  of Systems  Constitutional: Negative.  Negative for chills, fatigue and fever.  HENT:  Negative for congestion.   Respiratory: Negative.  Negative for shortness of breath and wheezing.   Cardiovascular:  Positive for leg swelling.  Gastrointestinal: Negative.  Negative for constipation and diarrhea.  Endocrine: Negative for polydipsia, polyphagia and polyuria.  Musculoskeletal:  Negative for arthralgias and myalgias.  Skin:        Lighten skin patches with peeling of the skin in sun exposed areas.   Neurological: Negative.  Negative for dizziness, weakness, numbness and headaches.    Today's Vitals   07/23/21 1521  BP: 122/68  Pulse: 80  Temp: 98.1 F (36.7 C)  TempSrc: Oral  Weight: 207 lb 3.2 oz (94 kg)  Height: 5' 6.8" (1.697 m)   Body mass index is 32.65 kg/m.   Objective:  Physical Exam Constitutional:      Appearance: Normal appearance. She is obese.  HENT:     Head: Normocephalic and atraumatic.  Cardiovascular:     Rate and Rhythm: Normal rate and regular rhythm.     Pulses: Normal pulses.     Heart sounds: Normal heart sounds. No murmur heard. Pulmonary:     Effort: Pulmonary effort is normal. No respiratory distress.     Breath sounds: Normal breath sounds. No wheezing.  Musculoskeletal:  General: Swelling present.     Comments: Swelling in both thighs   Skin:    General: Skin is warm and dry.     Capillary Refill: Capillary refill takes less than 2 seconds.     Findings: Erythema and lesion present.     Comments: Numerous lesions of skin (backs of hands, arms and face) erythema, scarring in all areas that are sun exposed including head, face, neck, chest, arms, hands. The skin is atrophic from chronic inflammation.  Neurological:     General: No focal deficit present.     Mental Status: She is alert and oriented to person, place, and time.     Assessment And Plan:     1. Establishing care with new doctor, encounter for -Patient is here to  establish care. Martin Majestic over patient medical, family, social and surgical history. -Reviewed with patient their medications and any allergies  -Reviewed with patient their sexual orientation, drug/tobacco and alcohol use -Dicussed any new concerns with patient  -recommended patient comes in for a physical exam and complete blood work.  -Educated patient about the importance of annual screenings and immunizations.  -Advised patient to eat a healthy diet along with exercise for atleast 30-45 min atleast 4-5 days of the week.  -Reviewed medical history and medication with the patient.   2. Prediabetes -Currently not on any meds, will check her labs.  - Hemoglobin A1c - CMP14+EGFR  3. Adult onset dermatomyositis ([place]) - Ambulatory referral to Rheumatology - CMP14+EGFR - CBC no Diff - Ambulatory referral to Dermatology  4. Need for influenza vaccination - Flu Vaccine QUAD 6+ mos PF IM (Fluarix Quad PF)  5. Class 1 obesity due to excess calories without serious comorbidity with body mass index (BMI) of 32.0 to 32.9 in adult  -Advised patient on a healthy diet including avoiding fast food and red meats. Increase the intake of lean meats including grilled chicken and [place].  Drink a lot of water. Decrease intake of fatty foods. Exercise for 30-45 min. 4-5 a week to decrease the risk of cardiac event.   The patient was encouraged to call or send a message through [place] for any questions or concerns.   Follow up: if symptoms persist or do not get better.   Side effects and appropriate use of all the medication(s) were discussed with the patient today. Patient advised to use the medication(s) as directed by their healthcare provider. The patient was encouraged to read, review, and understand all associated package inserts and contact our office with any questions or concerns. The patient accepts the risks of the treatment plan and had an opportunity to ask questions.   Staying healthy and  adopting a healthy lifestyle for your overall health is important. You should eat 7 or more servings of fruits and vegetables per day. You should drink plenty of water to keep yourself hydrated and your kidneys healthy. This includes about 65-80+ fluid ounces of water. Limit your intake of animal fats especially for elevated cholesterol. Avoid highly processed food and limit your salt intake if you have hypertension. Avoid foods high in saturated/Trans fats. Along with a healthy diet it is also very important to maintain time for yourself to maintain a healthy mental health with low stress levels. You should get atleast 150 min of moderate intensity exercise weekly for a healthy heart. Along with eating right and exercising, aim for at least 7-9 hours of sleep daily.  Eat more whole grains which includes barley, wheat berries, oats,  brown rice and whole wheat pasta. Use healthy plant oils which include olive, soy, corn, sunflower and peanut. Limit your caffeine and sugary drinks. Limit your intake of fast foods. Limit milk and dairy products to one or two daily servings.   Patient was given opportunity to ask questions. Patient verbalized understanding of the plan and was able to repeat key elements of the plan. All questions were answered to their satisfaction.  Raman Hanin [place], DNP   I, Raman Zamira Hickam have reviewed all documentation for this visit. The documentation on 07/23/21 for the exam, diagnosis, procedures, and orders are all accurate and complete.    IF YOU HAVE BEEN REFERRED TO A SPECIALIST, IT MAY TAKE 1-2 WEEKS TO SCHEDULE/PROCESS THE REFERRAL. IF YOU HAVE NOT HEARD FROM US/SPECIALIST IN TWO WEEKS, PLEASE GIVE [place] A CALL AT [phone] X [phone].   THE PATIENT IS ENCOURAGED TO PRACTICE SOCIAL DISTANCING DUE TO THE COVID-19 PANDEMIC.

## 2021-07-24 LAB — CMP14+EGFR
ALT: 28 IU/L (ref 0–32)
AST: 38 IU/L (ref 0–40)
Albumin/Globulin Ratio: 1.2 (ref 1.2–2.2)
Albumin: 4.5 g/dL (ref 3.8–4.8)
Alkaline Phosphatase: 71 IU/L (ref 44–121)
BUN/Creatinine Ratio: 26 — ABNORMAL HIGH (ref 9–23)
BUN: 12 mg/dL (ref 6–24)
Bilirubin Total: 0.4 mg/dL (ref 0.0–1.2)
CO2: 28 mmol/L (ref 20–29)
Calcium: 9.9 mg/dL (ref 8.7–10.2)
Chloride: 96 mmol/L (ref 96–106)
Creatinine, Ser: 0.46 mg/dL — ABNORMAL LOW (ref 0.57–1.00)
Globulin, Total: 3.9 g/dL (ref 1.5–4.5)
Glucose: 130 mg/dL — ABNORMAL HIGH (ref 70–99)
Potassium: 3.2 mmol/L — ABNORMAL LOW (ref 3.5–5.2)
Sodium: 138 mmol/L (ref 134–144)
Total Protein: 8.4 g/dL (ref 6.0–8.5)
eGFR: 122 mL/min/{1.73_m2} (ref >59)

## 2021-07-24 LAB — CBC
Hematocrit: 36.8 % (ref 34.0–46.6)
Hemoglobin: 13.1 g/dL (ref 11.1–15.9)
MCH: 32.2 pg (ref 26.6–33.0)
MCHC: 35.6 g/dL (ref 31.5–35.7)
MCV: 90 fL (ref 79–97)
Platelets: 273 10*3/uL (ref 150–450)
RBC: 4.07 x10E6/uL (ref 3.77–5.28)
RDW: 12.2 % (ref 11.7–15.4)
WBC: 7.3 10*3/uL (ref 3.4–10.8)

## 2021-07-24 LAB — HEMOGLOBIN A1C
Est. average glucose Bld gHb Est-mCnc: 134 mg/dL
Hgb A1c MFr Bld: 6.3 % — ABNORMAL HIGH (ref 4.8–5.6)

## 2021-08-07 ENCOUNTER — Other Ambulatory Visit: Payer: Self-pay

## 2021-08-07 MED ORDER — AMLODIPINE BESYLATE 5 MG PO TABS: 5.0000 mg | ORAL_TABLET | Freq: Every day | ORAL | 1 refills | Status: DC

## 2021-08-07 MED ORDER — VITAMIN D (ERGOCALCIFEROL) 1.25 MG (50000 UNIT) PO CAPS: 50000.0000 [IU] | ORAL_CAPSULE | ORAL | 1 refills | Status: DC

## 2021-08-07 MED ORDER — HYDROXYZINE HCL 25 MG PO TABS: 25.0000 mg | ORAL_TABLET | Freq: Three times a day (TID) | ORAL | 1 refills | Status: DC | PRN

## 2021-08-07 MED ORDER — VITAMIN D (ERGOCALCIFEROL) 1.25 MG (50000 UNIT) PO CAPS: 50000.0000 [IU] | ORAL_CAPSULE | Freq: Every day | ORAL | 1 refills | Status: DC | PRN

## 2021-08-20 ENCOUNTER — Encounter: Payer: Self-pay | Admitting: Nurse Practitioner

## 2021-08-20 ENCOUNTER — Ambulatory Visit (INDEPENDENT_AMBULATORY_CARE_PROVIDER_SITE_OTHER): Payer: Managed Care, Other (non HMO) | Admitting: Nurse Practitioner

## 2021-08-20 ENCOUNTER — Other Ambulatory Visit: Payer: Self-pay

## 2021-08-20 VITALS — BP 108/72 | HR 84 | Temp 98.1°F | Ht 66.0 in | Wt 210.6 lb

## 2021-08-20 DIAGNOSIS — E7439 Other disorders of intestinal carbohydrate absorption: Secondary | ICD-10-CM | POA: Diagnosis not present

## 2021-08-20 DIAGNOSIS — Z91018 Allergy to other foods: Secondary | ICD-10-CM

## 2021-08-20 DIAGNOSIS — Z23 Encounter for immunization: Secondary | ICD-10-CM | POA: Diagnosis not present

## 2021-08-20 DIAGNOSIS — Z6832 Body mass index (BMI) 32.0-32.9, adult: Secondary | ICD-10-CM

## 2021-08-20 DIAGNOSIS — J4520 Mild intermittent asthma, uncomplicated: Secondary | ICD-10-CM

## 2021-08-20 DIAGNOSIS — M7989 Other specified soft tissue disorders: Secondary | ICD-10-CM | POA: Diagnosis not present

## 2021-08-20 DIAGNOSIS — E876 Hypokalemia: Secondary | ICD-10-CM

## 2021-08-20 DIAGNOSIS — R7303 Prediabetes: Secondary | ICD-10-CM

## 2021-08-20 DIAGNOSIS — E6609 Other obesity due to excess calories: Secondary | ICD-10-CM

## 2021-08-20 DIAGNOSIS — Z6833 Body mass index (BMI) 33.0-33.9, adult: Secondary | ICD-10-CM

## 2021-08-20 MED ORDER — ALBUTEROL SULFATE HFA 108 (90 BASE) MCG/ACT IN AERS: 2.0000 | INHALATION_SPRAY | Freq: Four times a day (QID) | RESPIRATORY_TRACT | 2 refills | Status: DC | PRN

## 2021-08-20 MED ORDER — EPINEPHRINE 0.3 MG/0.3ML IJ SOAJ: 0.3000 mg | INTRAMUSCULAR | 0 refills | Status: DC | PRN

## 2021-08-20 MED ORDER — RYBELSUS 7 MG PO TABS: 1.0000 | ORAL_TABLET | Freq: Every day | ORAL | 0 refills | Status: DC

## 2021-08-20 NOTE — Patient Instructions (Signed)

## 2021-08-20 NOTE — Progress Notes (Signed)
I,[name],acting as a Education administrator for [employer], NP.,have documented all relevant documentation on the behalf of [employer], NP,as directed by  Bary Castilla, NP while in the presence of Bary Castilla, NP.  This visit occurred during the SARS-CoV-2 public health emergency.  Safety protocols were in place, including screening questions prior to the visit, additional usage of staff PPE, and extensive cleaning of exam room while observing appropriate contact time as indicated for disinfecting solutions.  Subjective:     Patient ID: Patricia Krause , female    DOB: Apr 13, 1976 , 45 y.o.   MRN: [id number]   Chief Complaint  Patient presents with   Weight Loss     HPI  Pt here to discuss DM & weight loss options. She would like to take a medication to help with weight loss & sugar intake. She says she has tried different diets and foods, nothing seems to be working. She would like all and any help if possible.     Past Medical History:  Diagnosis Date   Asthma    Dermatolysis    Hypertension      Family History  Problem Relation Age of Onset   Diabetes Father    Cancer Father    Diabetes Paternal Grandmother    Alzheimer's disease Paternal Grandfather      Current Outpatient Medications:    albuterol (VENTOLIN HFA) 108 (90 Base) MCG/ACT inhaler, Inhale 2 puffs into the lungs every 6 (six) hours as needed for wheezing or shortness of breath., Disp: 8 g, Rfl: 2   amLODipine (NORVASC) 5 MG tablet, Take 1 tablet (5 mg total) by mouth daily., Disp: 90 tablet, Rfl: 1   EPINEPHrine (EPIPEN 2-PAK) 0.3 mg/0.3 mL IJ SOAJ injection, Inject 0.3 mg into the muscle as needed for anaphylaxis., Disp: 1 each, Rfl: 0   hydrOXYzine (ATARAX/VISTARIL) 25 MG tablet, Take 1 tablet (25 mg total) by mouth 3 (three) times daily as needed., Disp: 90 tablet, Rfl: 1   levonorgestrel (MIRENA) 20 MCG/DAY IUD, by Intrauterine route., Disp: , Rfl:    Semaglutide (RYBELSUS) 7 MG TABS,  Take 1 tablet by mouth daily., Disp: 90 tablet, Rfl: 0   Vitamin D, Ergocalciferol, (DRISDOL) 1.25 MG (50000 UNIT) CAPS capsule, Take 1 capsule (50,000 Units total) by mouth every 7 (seven) days., Disp: 24 capsule, Rfl: 1   LASIX 40 MG tablet, Take 40 mg by mouth daily. (Patient not taking: Reported on 08/20/2021), Disp: , Rfl:    Allergies  Allergen Reactions   Cat Hair Extract Hives and Itching   Mixed Ragweed Other (See Comments)   Pollinex-T [Modified Tree Tyrosine Adsorbate] Other (See Comments)     Review of Systems  Constitutional: Negative.  Negative for fatigue.  HENT:  Negative for congestion.   Respiratory: Negative.  Negative for cough, choking, shortness of breath and wheezing.   Cardiovascular:  Positive for leg swelling. Negative for chest pain and palpitations.  Gastrointestinal:  Negative for constipation and diarrhea.  Neurological: Negative.  Negative for weakness, numbness and headaches.  Psychiatric/Behavioral: Negative.      Today's Vitals   08/20/21 1457  BP: 108/72  Pulse: 84  Temp: 98.1 F (36.7 C)  SpO2: 96%  Weight: 210 lb 9.6 oz (95.5 kg)  Height: _0  (1.676 m)  PainSc: 0-No pain   Body mass index is 33.99 kg/m.  Wt Readings from Last 3 Encounters:  08/20/21 210 lb 9.6 oz (95.5 kg)  07/23/21 207 lb 3.2 oz (94 kg)  12/28/19  190 lb (86.2 kg)    Objective:  Physical Exam Constitutional:      Appearance: Normal appearance. She is obese.  HENT:     Head: Normocephalic and atraumatic.  Cardiovascular:     Rate and Rhythm: Normal rate and regular rhythm.     Pulses: Normal pulses.     Heart sounds: Normal heart sounds. No murmur heard. Pulmonary:     Effort: Pulmonary effort is normal. No respiratory distress.     Breath sounds: Normal breath sounds. No wheezing.  Musculoskeletal:     Right lower leg: 2+ Pitting Edema present.     Left lower leg: 2+ Pitting Edema present.  Skin:    Capillary Refill: Capillary refill takes less than 2  seconds.  Neurological:     Mental Status: She is alert and oriented to person, place, and time.        Assessment And Plan:     1. Prediabetes -Last Hgb A1c was 6.3 on 07/23/21  - CMP14+EGFR - CBC no Diff - Semaglutide (RYBELSUS) 7 MG TABS; Take 1 tablet by mouth daily.  Dispense: 90 tablet; Refill: 0 -We discussed the use of rybelsus. She confirms there is no personal/family h/o thyroid cancer. She was instructed on how to administer the medication. Possible side effects including nausea, constipation and diarrhea were discussed with the patient. Its association with medullary thyroid carcinoma was also discussed. She is reminded to stop eating when full. She will start with 3 mg x 1 month and prescription for 7 mg sent pharmacy. All questions were answered to her satisfaction. She is in agreement with her treatment plan. She will come in for weight check.  - Insulin, random(561)  2. Leg swelling - Ambulatory referral to Cardiology -Patient was taking lasix 40 mg daily before but stopped due to hypokalemia. Will check potassium level today. Last potassium was 3.2 on 07/23/21  -Further evaluation for cardiology -Denies pain or warmth.  - CMP14+EGFR - CBC no Diff  3. Need for Tdap vaccination - Tdap vaccine greater than or equal to 7yo IM  4. Need for vaccination - Pneumococcal polysaccharide vaccine 23-valent greater than or equal to 2yo subcutaneous/IM  5. Glucose intolerance - Insulin, random(561)  6. Mild intermittent asthma without complication - albuterol (VENTOLIN HFA) 108 (90 Base) MCG/ACT inhaler; Inhale 2 puffs into the lungs every 6 (six) hours as needed for wheezing or shortness of breath.  Dispense: 8 g; Refill: 2  7. Food allergy - EPINEPHrine (EPIPEN 2-PAK) 0.3 mg/0.3 mL IJ SOAJ injection; Inject 0.3 mg into the muscle as needed for anaphylaxis.  Dispense: 1 each; Refill: 0  8. Class 1 obesity due to excess calories without serious comorbidity with body mass  index (BMI) of 32.0 to 32.9 in adult  -Advised patient on a healthy diet including avoiding fast food and red meats. Increase the intake of lean meats including grilled chicken and [place].  Drink a lot of water. Decrease intake of fatty foods. Exercise for 30-45 min. 4-5 a week to decrease the risk of cardiac event.   The patient was encouraged to call or send a message through [facility] for any questions or concerns.   Follow up: if symptoms persist or do not get better.   Side effects and appropriate use of all the medication(s) were discussed with the patient today. Patient advised to use the medication(s) as directed by their healthcare provider. The patient was encouraged to read, review, and understand all associated package inserts and contact our  office with any questions or concerns. The patient accepts the risks of the treatment plan and had an opportunity to ask questions.   Patient was given opportunity to ask questions. Patient verbalized understanding of the plan and was able to repeat key elements of the plan. All questions were answered to their satisfaction.  Raman Tram Wrenn, DNP   I, Raman Mahesh Sizemore have reviewed all documentation for this visit. The documentation on 08/20/21 for the exam, diagnosis, procedures, and orders are all accurate and complete.   IF YOU HAVE BEEN REFERRED TO A SPECIALIST, IT MAY TAKE 1-2 WEEKS TO SCHEDULE/PROCESS THE REFERRAL. IF YOU HAVE NOT HEARD FROM US/SPECIALIST IN TWO WEEKS, PLEASE GIVE [place] A CALL AT [phone] X [phone].   THE PATIENT IS ENCOURAGED TO PRACTICE SOCIAL DISTANCING DUE TO THE COVID-19 PANDEMIC.

## 2021-08-21 ENCOUNTER — Other Ambulatory Visit: Payer: Self-pay | Admitting: Nurse Practitioner

## 2021-08-21 DIAGNOSIS — M7989 Other specified soft tissue disorders: Secondary | ICD-10-CM

## 2021-08-21 LAB — CMP14+EGFR
ALT: 25 IU/L (ref 0–32)
AST: 34 IU/L (ref 0–40)
Albumin/Globulin Ratio: 1.2 (ref 1.2–2.2)
Albumin: 4.6 g/dL (ref 3.8–4.8)
Alkaline Phosphatase: 65 IU/L (ref 44–121)
BUN/Creatinine Ratio: 26 — ABNORMAL HIGH (ref 9–23)
BUN: 12 mg/dL (ref 6–24)
Bilirubin Total: 0.4 mg/dL (ref 0.0–1.2)
CO2: 22 mmol/L (ref 20–29)
Calcium: 9.7 mg/dL (ref 8.7–10.2)
Chloride: 102 mmol/L (ref 96–106)
Creatinine, Ser: 0.46 mg/dL — ABNORMAL LOW (ref 0.57–1.00)
Globulin, Total: 3.9 g/dL (ref 1.5–4.5)
Glucose: 110 mg/dL — ABNORMAL HIGH (ref 70–99)
Potassium: 4.3 mmol/L (ref 3.5–5.2)
Sodium: 138 mmol/L (ref 134–144)
Total Protein: 8.5 g/dL (ref 6.0–8.5)
eGFR: 122 mL/min/{1.73_m2} (ref >59)

## 2021-08-21 LAB — INSULIN, RANDOM: INSULIN: 109 u[IU]/mL — ABNORMAL HIGH (ref 2.6–24.9)

## 2021-08-21 LAB — CBC
Hematocrit: 37.4 % (ref 34.0–46.6)
Hemoglobin: 13 g/dL (ref 11.1–15.9)
MCH: 32.2 pg (ref 26.6–33.0)
MCHC: 34.8 g/dL (ref 31.5–35.7)
MCV: 93 fL (ref 79–97)
Platelets: 290 10*3/uL (ref 150–450)
RBC: 4.04 x10E6/uL (ref 3.77–5.28)
RDW: 12.6 % (ref 11.7–15.4)
WBC: 7.3 10*3/uL (ref 3.4–10.8)

## 2021-08-21 MED ORDER — FUROSEMIDE 20 MG PO TABS: 20.0000 mg | ORAL_TABLET | Freq: Every day | ORAL | 0 refills | Status: DC

## 2021-08-25 ENCOUNTER — Other Ambulatory Visit: Payer: Self-pay

## 2021-08-25 DIAGNOSIS — M7989 Other specified soft tissue disorders: Secondary | ICD-10-CM

## 2021-08-26 ENCOUNTER — Other Ambulatory Visit: Payer: Self-pay | Admitting: Nurse Practitioner

## 2021-08-26 ENCOUNTER — Other Ambulatory Visit: Payer: Self-pay

## 2021-08-27 ENCOUNTER — Telehealth: Payer: Self-pay

## 2021-08-27 NOTE — Telephone Encounter (Signed)
I called the pt because Ghumman, DNP, FNP-BC wanted to know if the Furosemide medication was helping with the pt's swelling because the pharmacy sent a refill request.

## 2021-08-28 ENCOUNTER — Encounter: Payer: Self-pay | Admitting: Nurse Practitioner

## 2021-09-01 ENCOUNTER — Encounter: Payer: Self-pay | Admitting: Interventional Cardiology

## 2021-09-01 ENCOUNTER — Encounter: Payer: Self-pay | Admitting: Internal Medicine

## 2021-09-01 ENCOUNTER — Encounter: Payer: Self-pay | Admitting: Nurse Practitioner

## 2021-09-01 ENCOUNTER — Encounter: Payer: Self-pay | Admitting: Dermatology

## 2021-09-02 ENCOUNTER — Other Ambulatory Visit: Payer: Self-pay | Admitting: Nurse Practitioner

## 2021-09-08 ENCOUNTER — Other Ambulatory Visit: Payer: Self-pay

## 2021-09-08 DIAGNOSIS — R7303 Prediabetes: Secondary | ICD-10-CM

## 2021-09-08 MED ORDER — RYBELSUS 7 MG PO TABS: 1.0000 | ORAL_TABLET | Freq: Every day | ORAL | 0 refills | Status: DC

## 2021-09-16 ENCOUNTER — Other Ambulatory Visit: Payer: Self-pay | Admitting: Nurse Practitioner

## 2021-09-16 MED ORDER — METFORMIN HCL 500 MG PO TABS: 500.0000 mg | ORAL_TABLET | Freq: Two times a day (BID) | ORAL | 2 refills | Status: DC

## 2021-09-22 ENCOUNTER — Other Ambulatory Visit: Payer: Self-pay

## 2021-09-22 ENCOUNTER — Encounter: Payer: Self-pay | Admitting: Interventional Cardiology

## 2021-09-22 ENCOUNTER — Ambulatory Visit (INDEPENDENT_AMBULATORY_CARE_PROVIDER_SITE_OTHER): Payer: Managed Care, Other (non HMO) | Admitting: Interventional Cardiology

## 2021-09-22 VITALS — BP 124/80 | HR 67 | Ht 66.0 in | Wt 211.4 lb

## 2021-09-22 DIAGNOSIS — R600 Localized edema: Secondary | ICD-10-CM | POA: Diagnosis not present

## 2021-09-22 DIAGNOSIS — R0602 Shortness of breath: Secondary | ICD-10-CM | POA: Diagnosis not present

## 2021-09-22 DIAGNOSIS — I890 Lymphedema, not elsewhere classified: Secondary | ICD-10-CM | POA: Diagnosis not present

## 2021-09-22 NOTE — Patient Instructions (Signed)
Medication Instructions:  Your physician recommends that you continue on your current medications as directed. Please refer to the Current Medication list given to you today.  *If you need a refill on your cardiac medications before your next appointment, please call your pharmacy*   Lab Work: none If you have labs (blood work) drawn today and your tests are completely normal, you will receive your results only by: [place] (if you have MyChart) OR A paper copy in the mail If you have any lab test that is abnormal or we need to change your treatment, we will call you to review the results.   Testing/Procedures: Your physician has requested that you have an echocardiogram. Echocardiography is a painless test that uses sound waves to create images of your heart. It provides your doctor with information about the size and shape of your heart and how well your heart's chambers and valves are working. This procedure takes approximately one hour. There are no restrictions for this procedure.    Follow-Up: At [facility], you and your health needs are our priority.  As part of our continuing mission to provide you with exceptional heart care, we have created designated Provider Care Teams.  These Care Teams include your primary Cardiologist (physician) and Advanced Practice Providers (APPs -  Physician Assistants and Nurse Practitioners) who all work together to provide you with the care you need, when you need it.  We recommend signing up for the patient portal called "MyChart".  Sign up information is provided on this After Visit Summary.  MyChart is used to connect with patients for Virtual Visits (Telemedicine).  Patients are able to view lab/test results, encounter notes, upcoming appointments, etc.  Non-urgent messages can be sent to your provider as well.   To learn more about what you can do with MyChart, go to [email or web address].    Your next appointment:   As needed  The  format for your next appointment:   In Person  Provider:   Larae Grooms, MD     Other Instructions You have been referred to [facility]

## 2021-09-22 NOTE — Progress Notes (Signed)
Cardiology Office Note   Date:  09/22/2021   ID:  Patricia Krause, DOB 18-Apr-1976, MRN [id number]  PCP:  Bary Castilla, NP    No chief complaint on file.  Leg swelling  Wt Readings from Last 3 Encounters:  09/22/21 211 lb 6.4 oz (95.9 kg)  08/20/21 210 lb 9.6 oz (95.5 kg)  07/23/21 207 lb 3.2 oz (94 kg)       History of Present Illness: Patricia Krause is a 45 y.o. female who is being seen today for the evaluation of leg edema at the request of Bary Castilla, NP  She moved from [place] in 2018.  She had leg ultrasounds and echocardiogram.  She thinks the heart eval was negative.   She was diagnosed with dermatomyositis in 2005.  She has had swelling in her legs for years.  It has progressively been more severe.   She has not tolerated compression stockings in the past.   Denies : Chest pain. Dizziness. Nitroglycerin use. Orthopnea. Palpitations. Paroxysmal nocturnal dyspnea. Shortness of breath. Syncope.      Past Medical History:  Diagnosis Date   Asthma    Dermatolysis    Hypertension     Past Surgical History:  Procedure Laterality Date   BREAST SURGERY     WISDOM TOOTH EXTRACTION       Current Outpatient Medications  Medication Sig Dispense Refill   albuterol (VENTOLIN HFA) 108 (90 Base) MCG/ACT inhaler Inhale 2 puffs into the lungs every 6 (six) hours as needed for wheezing or shortness of breath. 8 g 2   amLODipine (NORVASC) 5 MG tablet Take 1 tablet (5 mg total) by mouth daily. 90 tablet 1   EPINEPHrine (EPIPEN 2-PAK) 0.3 mg/0.3 mL IJ SOAJ injection Inject 0.3 mg into the muscle as needed for anaphylaxis. 1 each 0   hydrOXYzine (ATARAX/VISTARIL) 25 MG tablet Take 1 tablet (25 mg total) by mouth 3 (three) times daily as needed. 90 tablet 1   levonorgestrel (MIRENA) 20 MCG/DAY IUD by Intrauterine route.     metFORMIN (GLUCOPHAGE) 500 MG tablet Take 1 tablet (500 mg total) by mouth 2 (two) times daily with a meal. 60 tablet 2   Vitamin D,  Ergocalciferol, (DRISDOL) 1.25 MG (50000 UNIT) CAPS capsule Take 1 capsule (50,000 Units total) by mouth every 7 (seven) days. 24 capsule 1   furosemide (LASIX) 20 MG tablet Take 1 tablet (20 mg total) by mouth daily for 7 days. 7 tablet 0   No current facility-administered medications for this visit.    Allergies:   Cat hair extract, Mixed ragweed, and Pollinex-t [modified tree tyrosine adsorbate]    Social History:  The patient  reports that she has never smoked. She has never used smokeless tobacco. She reports current alcohol use. She reports that she does not use drugs.   Family History:  The patient's family history includes Alzheimer's disease in her paternal grandfather; Cancer in her father; Diabetes in her father and paternal grandmother.    ROS:  Please see the history of present illness.   Otherwise, review of systems are positive for leg swelling.   All other systems are reviewed and negative.    PHYSICAL EXAM: VS:  BP 124/80   Pulse 67   Ht 5\' 6"  (1.676 m)   Wt 211 lb 6.4 oz (95.9 kg)   SpO2 95%   BMI 34.12 kg/m  , BMI Body mass index is 34.12 kg/m. GEN: Well nourished, well developed, in no acute distress HEENT: normal  Neck: no JVD, carotid bruits, or masses Cardiac: RRR; no murmurs, rubs, or gallops,; bilateral severe edema  Respiratory:  clear to auscultation bilaterally, normal work of breathing GI: soft, nontender, nondistended, + BS MS: no deformity or atrophy Skin: rash on chest Neuro:  Strength and sensation are intact Psych: euthymic mood, full affect   EKG:   The ekg ordered today demonstrates NSR, LAD, no ST segment   Recent Labs: 08/20/2021: ALT 25; BUN 12; Creatinine, Ser 0.46; Hemoglobin 13.0; Platelets 290; Potassium 4.3; Sodium 138   Lipid Panel No results found for: CHOL, TRIG, HDL, CHOLHDL, VLDL, LDLCALC, LDLDIRECT   Other studies Reviewed: Additional studies/ records that were reviewed today with results demonstrating: labs reviewed,Cr  0.46, Hbg 13.1.  No DVT by prior lower extremity ultrasound   ASSESSMENT AND PLAN:  Leg edema: Appears to be nonpitting, lymphedema.  Can still try to elevate legs.  Will refer to vascular surgery to see if there are any other options.  I do not think her edema is coming from any type of congestive heart failure. DOE: Check echo to eval for pulm HTN in the setting of autoimmune disorder. No longer on immune suppression.  She is going to follow-up with a rheumatologist. At some point, she will need fasting labs to evaluate his lipids.   Current medicines are reviewed at length with the patient today.  The patient concerns regarding her medicines were addressed.  The following changes have been made:  No change  Labs/ tests ordered today include: echo No orders of the defined types were placed in this encounter.   Recommend 150 minutes/week of aerobic exercise Low fat, low carb, high fiber diet recommended  Disposition:   FU as needed   Signed, Larae Grooms, MD  09/22/2021 3:46 PM    [facility] [place], [place], [place]  [address] Phone: [phone]; Fax: [phone]

## 2021-10-06 ENCOUNTER — Encounter: Payer: Self-pay | Admitting: Nurse Practitioner

## 2021-10-07 ENCOUNTER — Other Ambulatory Visit: Payer: Self-pay

## 2021-10-07 MED ORDER — HYDROXYZINE HCL 25 MG PO TABS: 25.0000 mg | ORAL_TABLET | Freq: Three times a day (TID) | ORAL | 1 refills | Status: DC | PRN

## 2021-10-09 ENCOUNTER — Encounter: Payer: Self-pay | Admitting: Nurse Practitioner

## 2021-10-14 ENCOUNTER — Encounter: Payer: Self-pay | Admitting: Dermatology

## 2021-10-14 ENCOUNTER — Encounter: Payer: Self-pay | Admitting: Rheumatology

## 2021-10-14 ENCOUNTER — Encounter: Payer: Self-pay | Admitting: Nurse Practitioner

## 2021-10-14 ENCOUNTER — Encounter: Payer: Self-pay | Admitting: Interventional Cardiology

## 2021-10-15 ENCOUNTER — Encounter: Payer: Self-pay | Admitting: Nurse Practitioner

## 2021-10-16 ENCOUNTER — Other Ambulatory Visit: Payer: Self-pay | Admitting: Nurse Practitioner

## 2021-10-16 MED ORDER — METFORMIN HCL ER 500 MG PO TB24: 500.0000 mg | ORAL_TABLET | Freq: Every day | ORAL | 2 refills | Status: DC

## 2021-10-17 ENCOUNTER — Ambulatory Visit: Payer: Managed Care, Other (non HMO) | Admitting: Internal Medicine

## 2021-10-17 ENCOUNTER — Other Ambulatory Visit: Payer: Self-pay

## 2021-10-17 ENCOUNTER — Ambulatory Visit (HOSPITAL_COMMUNITY): Payer: BC Managed Care – PPO | Attending: Cardiology

## 2021-10-17 DIAGNOSIS — R0602 Shortness of breath: Secondary | ICD-10-CM | POA: Diagnosis not present

## 2021-10-17 DIAGNOSIS — R600 Localized edema: Secondary | ICD-10-CM | POA: Insufficient documentation

## 2021-10-17 LAB — ECHOCARDIOGRAM COMPLETE
Area-P 1/2: 4.13 cm2
S' Lateral: 2.8 cm

## 2021-10-24 NOTE — Progress Notes (Signed)
Office Visit Note  Patient: Patricia Krause             Date of Birth: 06-Aug-1976           MRN: [id number]             PCP: [name], [place] Referring: Bary Castilla, NP Visit Date: 11/06/2021 Occupation: @GUAROCC @  Subjective:  Dermatomyositis, joint stiffness.  History of Present Illness: Patricia Krause is a 46 y.o. female seen in consultation per request of her PCP.  According the patient she was diagnosed with dermatomyositis in 2005 while she was living in [place].  She states she developed muscle weakness and rash.  She was initially treated with high-dose prednisone which was tapered gradually tapered.  She also had IVIG.  She was a started on hydroxychloroquine, methotrexate and CellCept to control her symptoms.  She states that she stayed on the treatment until 2018.  While she moved to [facility] in 2018 she quit taking her medications as she did not have any insurance.  She did not noticed any worsening in her symptoms since she has moved to [place].  She continues to have contractures in her joints.  She states that she developed pedal edema 2 years prior to moving to [place].  Since she moved to [facility] she states she has not been able to watch her diet.  She gained a lot of weight and also had increased pedal edema.  She is also become prediabetic.  She was recently evaluated by cardiologist for pedal edema.  According to the patient the cardiology work-up was negative.  She has been referred to vascular surgery for evaluation.  There is no known family history of autoimmune disease.  She is gravida 1, para 1, miscarriages 0.  Birth control method is Mirena IUD.  No history of DVTs.  Activities of Daily Living:  Patient reports morning stiffness for a few minutes.   Patient Denies nocturnal pain.  Difficulty dressing/grooming: Denies Difficulty climbing stairs: Reports Difficulty getting out of chair: Reports Difficulty using hands for taps, buttons,  cutlery, and/or writing: Reports  Review of Systems  Constitutional:  Positive for fatigue.  HENT:  Negative for mouth sores, mouth dryness and nose dryness.   Eyes:  Positive for itching. Negative for pain and dryness.  Respiratory:  Positive for shortness of breath. Negative for difficulty breathing.   Cardiovascular:  Positive for swelling in legs/feet. Negative for chest pain and palpitations.  Gastrointestinal:  Positive for diarrhea. Negative for blood in stool and constipation.  Endocrine: Negative for increased urination.  Genitourinary:  Negative for difficulty urinating.  Musculoskeletal:  Positive for joint pain, joint pain, myalgias, morning stiffness, muscle tenderness and myalgias. Negative for joint swelling.  Skin:  Positive for color change. Negative for rash, redness and sensitivity to sunlight.  Allergic/Immunologic: Negative for susceptible to infections.  Neurological:  Positive for weakness. Negative for dizziness, numbness, headaches and memory loss.  Hematological:  Negative for bruising/bleeding tendency and swollen glands.  Psychiatric/Behavioral:  Negative for depressed mood, confusion and sleep disturbance. The patient is not nervous/anxious.    PMFS History:  Patient Active Problem List   Diagnosis Date Noted   History of asthma 11/06/2021   Vitamin D deficiency 11/06/2021   Prediabetes 11/06/2021   Essential hypertension 11/06/2021   Hypokalemia 08/20/2021    Past Medical History:  Diagnosis Date   Asthma    Dermatolysis    Hypertension     Family History  Problem Relation Age of Onset  Diabetes Father    Cancer Father    High Cholesterol Brother    Diabetes Paternal Grandmother    Alzheimer's disease Paternal Grandfather    Diabetes Son    Autism Son    Past Surgical History:  Procedure Laterality Date   BREAST SURGERY     WISDOM TOOTH EXTRACTION     Social History   Social History Narrative   Not on file   Immunization History   Administered Date(s) Administered   Influenza,inj,Quad PF,6+ Mos 07/23/2021   PFIZER(Purple Top)SARS-COV-2 Vaccination 12/29/2019, 01/23/2020, 08/02/2020   Pfizer Covid-19 Vaccine Bivalent Booster 12yrs & up 07/05/2021   Pneumococcal Polysaccharide-23 08/20/2021   Tdap 08/20/2021     Objective: Vital Signs: BP 121/74 (BP Location: Right Arm, Patient Position: Sitting, Cuff Size: Normal)    Pulse 65    Ht 5\' 8"  (1.727 m)    Wt 213 lb (96.6 kg)    BMI 32.39 kg/m    Physical Exam Vitals and nursing note reviewed.  Constitutional:      Appearance: She is well-developed.  HENT:     Head: Normocephalic and atraumatic.  Eyes:     Conjunctiva/sclera: Conjunctivae normal.  Cardiovascular:     Rate and Rhythm: Normal rate and regular rhythm.     Heart sounds: Normal heart sounds.  Pulmonary:     Effort: Pulmonary effort is normal.     Breath sounds: Normal breath sounds.  Abdominal:     General: Bowel sounds are normal.     Palpations: Abdomen is soft.  Musculoskeletal:     Cervical back: Normal range of motion.     Right lower leg: Edema present.     Left lower leg: Edema present.  Lymphadenopathy:     Cervical: No cervical adenopathy.  Skin:    General: Skin is warm and dry.     Capillary Refill: Capillary refill takes 2 to 3 seconds.     Comments: Skin tightness was noted on the fingers possible sclerodactyly.  Nailbed capillary changes were noted.  Calcinosis was noted over right forearm.  Poikiloderma was noted.  Hypopigmented lesions were noted on the extremities.  Neurological:     Mental Status: She is alert and oriented to person, place, and time.  Psychiatric:        Behavior: Behavior normal.     Musculoskeletal Exam: C-spine was in good range of motion.  [place] hump was noted.  Shoulder joint abduction was limited 120 degrees.  Elbow joint contractures were noted.  Wrist joints in good range of motion.  She had incomplete fist formation and has contractures in her  PIP and DIP joints.  Loss of digital tuft was noted.  Hip joints and knee joints in good range of motion.  She has pedal edema bilaterally.  There was no tenderness over ankles or MTPs.  CDAI Exam: CDAI Score: -- Patient Global: --; Provider Global: -- Swollen: --; Tender: -- Joint Exam 11/06/2021   No joint exam has been documented for this visit   There is currently no information documented on the homunculus. Go to the Rheumatology activity and complete the homunculus joint exam.  Investigation: No additional findings.  Imaging: ECHOCARDIOGRAM COMPLETE  Result Date: 10/17/2021    ECHOCARDIOGRAM REPORT   Patient Name:   Patricia Krause Date of Exam: 10/17/2021 Medical Rec #:  [id number]      Height:       66.0 in Accession #:    [id number]     Weight:  211.4 lb Date of Birth:  03-Apr-1976      BSA:          2.047 m Patient Age:    45 years       BP:           124/80 mmHg Patient Gender: F              HR:           73 bpm. Exam Location:  [address] Procedure: 3D Echo, 2D Echo, Cardiac Doppler and Color Doppler Indications:    R06.00 Dyspnea  History:        Patient has no prior history of Echocardiogram examinations.                 Signs/Symptoms:Shortness of Breath, Edema and Dyspnea; Risk                 Factors:Hypertension. AutoImmune Disorder- Dermatomyocystitis.  Sonographer:    Deliah Boston RDCS Referring Phys: Jettie Booze IMPRESSIONS  1. Left ventricular ejection fraction, by estimation, is 60 to 65%. Left ventricular ejection fraction by 3D volume is 65 %. The left ventricle has normal function. The left ventricle has no regional wall motion abnormalities. Left ventricular diastolic  parameters are consistent with Grade I diastolic dysfunction (impaired relaxation).  2. Right ventricular systolic function is normal. The right ventricular size is normal. Tricuspid regurgitation signal is inadequate for assessing PA pressure.  3. The mitral valve is normal in structure.  Trivial mitral valve regurgitation.  4. The aortic valve is tricuspid. Aortic valve regurgitation is not visualized. No aortic stenosis is present.  5. The inferior vena cava is normal in size with greater than 50% respiratory variability, suggesting right atrial pressure of 3 mmHg. Comparison(s): No prior Echocardiogram. FINDINGS  Left Ventricle: Left ventricular ejection fraction, by estimation, is 60 to 65%. Left ventricular ejection fraction by 3D volume is 65 %. The left ventricle has normal function. The left ventricle has no regional wall motion abnormalities. The left ventricular internal cavity size was normal in size. There is no left ventricular hypertrophy. Left ventricular diastolic parameters are consistent with Grade I diastolic dysfunction (impaired relaxation). Right Ventricle: The right ventricular size is normal. No increase in right ventricular wall thickness. Right ventricular systolic function is normal. Tricuspid regurgitation signal is inadequate for assessing PA pressure. Left Atrium: Left atrial size was normal in size. Right Atrium: Right atrial size was normal in size. Pericardium: There is no evidence of pericardial effusion. Mitral Valve: The mitral valve is normal in structure. Trivial mitral valve regurgitation. Tricuspid Valve: The tricuspid valve is normal in structure. Tricuspid valve regurgitation is trivial. Aortic Valve: The aortic valve is tricuspid. Aortic valve regurgitation is not visualized. No aortic stenosis is present. Pulmonic Valve: The pulmonic valve was normal in structure. Pulmonic valve regurgitation is trivial. Aorta: The aortic root and ascending aorta are structurally normal, with no evidence of dilitation. Venous: The inferior vena cava is normal in size with greater than 50% respiratory variability, suggesting right atrial pressure of 3 mmHg. IAS/Shunts: The atrial septum is grossly normal.  LEFT VENTRICLE PLAX 2D LVIDd:         5.15 cm         Diastology  LVIDs:         2.80 cm         LV e' medial:    12.40 cm/s LV PW:         1.10 cm  LV E/e' medial:  9.8 LV IVS:        0.85 cm         LV e' lateral:   14.00 cm/s LVOT diam:     2.40 cm         LV E/e' lateral: 8.7 LV SV:         90 LV SV Index:   44 LVOT Area:     4.52 cm        3D Volume EF                                LV 3D EF:    Left                                             ventricul                                             ar                                             ejection                                             fraction                                             by 3D                                             volume is                                             65 %.                                 3D Volume EF:                                3D EF:        65 %                                LV EDV:       135 ml                                LV ESV:       47  ml                                LV SV:        88 ml RIGHT VENTRICLE RV S prime:     17.20 cm/s TAPSE (M-mode): 2.6 cm LEFT ATRIUM             Index        RIGHT ATRIUM           Index LA diam:        3.30 cm 1.61 cm/m   RA Area:     21.40 cm LA Vol (A2C):   55.0 ml 26.86 ml/m  RA Volume:   66.80 ml  32.63 ml/m LA Vol (A4C):   74.2 ml 36.24 ml/m LA Biplane Vol: 70.5 ml 34.43 ml/m  AORTIC VALVE LVOT Vmax:   94.45 cm/s LVOT Vmean:  61.000 cm/s LVOT VTI:    0.200 m  AORTA Ao Root diam: 3.20 cm Ao Asc diam:  2.90 cm MITRAL VALVE MV Area (PHT): cm          SHUNTS MV Decel Time: 184 msec     Systemic VTI:  0.20 m MV E velocity: 121.75 cm/s  Systemic Diam: 2.40 cm MV A velocity: 72.28 cm/s MV E/A ratio:  1.68 Gwyndolyn Kaufman MD Electronically signed by Gwyndolyn Kaufman MD Signature Date/Time: 10/17/2021/4:30:55 PM    Final    XR Hand 2 View Left  Result Date: 11/06/2021 No CMC, MCP, PIP or DIP narrowing was noted.  No intercarpal or radiocarpal joint space narrowing was noted.  No erosive changes were noted. Impression:  Unremarkable x-ray of the hand.  XR Hand 2 View Right  Result Date: 11/06/2021 No MCP, PIP or DIP narrowing was noted.  No intercarpal or radiocarpal joint space narrowing was noted.  Extra-articular calcification was noted next to the first proximal phalanx. Impression: Unremarkable x-rays of the hand.   Recent Labs: Lab Results  Component Value Date   WBC 7.3 08/20/2021   HGB 13.0 08/20/2021   PLT 290 08/20/2021   NA 138 08/20/2021   K 4.3 08/20/2021   CL 102 08/20/2021   CO2 22 08/20/2021   GLUCOSE 110 (H) 08/20/2021   BUN 12 08/20/2021   CREATININE 0.46 (L) 08/20/2021   BILITOT 0.4 08/20/2021   ALKPHOS 65 08/20/2021   AST 34 08/20/2021   ALT 25 08/20/2021   PROT 8.5 08/20/2021   ALBUMIN 4.6 08/20/2021   CALCIUM 9.7 08/20/2021   GFRAA >60 12/28/2019    Speciality Comments: No specialty comments available.  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 Left bicep muscle bx 2005 + skin bx+. IVIG in the past. Normal TTE, PFTs, and LENIs documented in note 2018 -she has been off immunosuppressive agents since 2018 due to lack of insurance.  She has not noticed any worsening of her joint pain or rash since she has been off medications.  Generalized muscle wasting was noted.  She had no difficulty getting up from the chair.  She had limited range of motion of her shoulder joints but no discomfort.  She had extensive hyperand hypopigmentation.  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  Calcinosis was noted over right forearm.  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  Nailbed capillary changes with the decreased capillary refill was noted.  I will obtain labs today.  Plan: Urinalysis, Routine w reflex microscopic, CK, Sedimentation rate, Aldolase, Myositis Assessr Plus Jo-1 Autoabs, CT Chest High  Resolution, Ambulatory referral to Dermatology  High risk medication use -she was on MTX 8 tablets weekly, plaquenil 200 mg 1 tablet by mouth twice daily, and MMF.  At [place] the rheumatologist discussed tofacitinib at the last visit.  She received IVIG in the past. -In anticipation to start her on immunosuppressive therapy I will obtain following labs today.  Plan: CBC with Differential/Platelet, COMPLETE METABOLIC PANEL WITH GFR, Hepatitis B surface antigen, Hepatitis B core antibody, IgM, QuantiFERON-TB Gold Plus, Serum protein electrophoresis with reflex, IgG, IgA, IgM, HIV Antibody (routine testing w rflx)  Pain in both hands -she complains of pain in her bilateral hands.  No synovitis was noted.  She states she had significant swelling in the past.  Plan: Rheumatoid factor, Cyclic citrul peptide antibody, IgG, XR Hand 2 View Right, XR Hand 2 View Left.  X-rays of bilateral hands were unremarkable today.  Bilateral swelling of feet and ankles-she has significant pedal edema.  No warmth swelling or effusion was noted over ankles or feet.  Raynaud's syndrome without gangrene -she was on sildenafil and nifedipine in the past.  Currently she is on amlodipine.  She had no digital cyanosis on examination.  No digital ulcers were noted.  She decreased capillary refill.- Plan: ANA, Anti-scleroderma antibody, Anti-DNA antibody, double-stranded, Anti-Smith antibody, RNP Antibody, Sjogrens syndrome-A extractable nuclear antibody, Sjogrens syndrome-B extractable nuclear antibody, C3 and C4  Shortness of breath-she complains of some shortness of breath on exertion.  I will obtain high-resolution CT to evaluate this further.  Prediabetes-she is on metformin and semaglutide.  Pedal edema-she has significant pedal edema for last several years.  She states she had work-up in [place].  She recently had cardiology work-up including echocardiogram.  She has an appointment with the vascular  surgery.  Vitamin D deficiency-she is on vitamin D supplement.  History of asthma  Essential hypertension-her blood pressure was normal.  Other fatigue -she complains of increased fatigue.  Plan: TSH  Orders: Orders Placed This Encounter  Procedures   XR Hand 2 View Right   XR Hand 2 View Left   CT Chest High Resolution   CBC with Differential/Platelet   COMPLETE METABOLIC PANEL WITH GFR   Urinalysis, Routine w reflex microscopic   CK   TSH   Sedimentation rate   Aldolase   Myositis Assessr Plus Jo-1 Autoabs   ANA   Anti-scleroderma antibody   Anti-DNA antibody, double-stranded   Anti-Smith antibody   RNP Antibody   Sjogrens syndrome-A extractable nuclear antibody   Sjogrens syndrome-B extractable nuclear antibody   C3 and C4   Rheumatoid factor   Cyclic citrul peptide antibody, IgG   Hepatitis B surface antigen   Hepatitis B core antibody, IgM   QuantiFERON-TB Gold Plus   Serum protein electrophoresis with reflex   IgG, IgA, IgM   HIV Antibody (routine testing w rflx)   Ambulatory referral to Dermatology   No orders of the defined types were placed in this encounter.  Total time spent in reviewing records, previous therapies, evaluation of the patient and counseling was over 60 minutes.  Follow-Up Instructions: Return for Dermatomyositis.   Bo Merino, MD  Note - This record has been created using Editor, commissioning.  Chart creation errors have been sought, but may not always  have been located.  Such creation errors do not reflect on  the standard of medical care.

## 2021-10-28 ENCOUNTER — Other Ambulatory Visit: Payer: Self-pay

## 2021-10-28 DIAGNOSIS — I890 Lymphedema, not elsewhere classified: Secondary | ICD-10-CM

## 2021-11-05 ENCOUNTER — Encounter: Payer: Self-pay | Admitting: Nurse Practitioner

## 2021-11-05 ENCOUNTER — Ambulatory Visit: Payer: BC Managed Care – PPO | Admitting: Nurse Practitioner

## 2021-11-05 ENCOUNTER — Other Ambulatory Visit: Payer: Self-pay

## 2021-11-05 VITALS — BP 124/68 | HR 72 | Temp 98.0°F | Ht 66.0 in | Wt 214.6 lb

## 2021-11-05 DIAGNOSIS — R7303 Prediabetes: Secondary | ICD-10-CM | POA: Diagnosis not present

## 2021-11-05 DIAGNOSIS — Z6834 Body mass index (BMI) 34.0-34.9, adult: Secondary | ICD-10-CM

## 2021-11-05 DIAGNOSIS — Z1159 Encounter for screening for other viral diseases: Secondary | ICD-10-CM

## 2021-11-05 DIAGNOSIS — R197 Diarrhea, unspecified: Secondary | ICD-10-CM | POA: Diagnosis not present

## 2021-11-05 DIAGNOSIS — E6609 Other obesity due to excess calories: Secondary | ICD-10-CM | POA: Diagnosis not present

## 2021-11-05 DIAGNOSIS — E8881 Metabolic syndrome: Secondary | ICD-10-CM | POA: Diagnosis not present

## 2021-11-05 MED ORDER — FLUTICASONE PROPIONATE (INHAL) 50 MCG/ACT IN AEPB: 2.0000 | INHALATION_SPRAY | Freq: Every day | RESPIRATORY_TRACT | 5 refills | Status: DC

## 2021-11-05 MED ORDER — MUPIROCIN 2 % EX OINT: 1.0000 "application " | TOPICAL_OINTMENT | Freq: Two times a day (BID) | CUTANEOUS | 3 refills | Status: DC

## 2021-11-05 MED ORDER — RYBELSUS 7 MG PO TABS: 1.0000 | ORAL_TABLET | Freq: Every day | ORAL | 0 refills | Status: DC

## 2021-11-05 NOTE — Patient Instructions (Addendum)
Preventing Type 2 Diabetes Mellitus °Type 2 diabetes, also called type 2 diabetes mellitus, is a long-term (chronic) disease that affects sugar (glucose) levels in your blood. Normally, a hormone called insulin allows glucose to enter cells in your body. The cells use glucose for energy. With type 2 diabetes, you will have one or both of these problems: °Your pancreas does not make enough insulin. °Cells in your body do not respond properly to insulin that your body makes (insulin resistance). °Insulin resistance or lack of insulin causes extra glucose to build up in the blood instead of going into cells. As a result, high blood glucose (hyperglycemia) develops. That can cause many complications. Being overweight or obese and having an inactive (sedentary) lifestyle can increase your risk for diabetes. Type 2 diabetes can be delayed or prevented by making certain nutrition and lifestyle changes. °How can this condition affect me? °If you do not take steps to prevent diabetes, your blood glucose levels may keep increasing over time. Too much glucose in your blood for a long time can damage your blood vessels, heart, kidneys, nerves, and eyes. °Type 2 diabetes can lead to chronic health problems and complications, such as: °Heart disease. °Stroke. °Blindness. °Kidney disease. °Depression. °Poor circulation in your feet and legs. In severe cases, a foot or leg may need to be surgically removed (amputated). °What can increase my risk? °You may be more likely to develop type 2 diabetes if you: °Have type 2 diabetes in your family. °Are overweight or obese. °Have a sedentary lifestyle. °Have insulin resistance or a history of prediabetes. °Have a history of pregnancy-related (gestational) diabetes or polycystic ovary syndrome (PCOS). °What actions can I take to prevent this? °It can be difficult to recognize signs of type 2 diabetes. Taking action to prevent the disease before you develop symptoms is the best way to avoid  possible damage to your body. Making certain nutrition and lifestyle changes may prevent or delay the disease and related health problems. °Nutrition ° °Eat healthy meals and snacks regularly. Do not skip meals. Fruit or a handful of nuts is a healthy snack between meals. °Drink water throughout the day. Avoid drinks that contain added sugar, such as soda or sweetened tea. Drink enough fluid to keep your urine pale yellow. °Follow instructions from your health care provider about eating or drinking restrictions. °Limit the amount of food you eat by: °Managing how much you eat at a time (portion size). °Checking food labels for the serving sizes of food. °Using a kitchen scale to weigh amounts of food. °Sauté or steam food instead of frying it. Cook with water or broth instead of oils or butter. °Limit saturated fat and salt (sodium) in your diet. Have no more than 1 tsp (2,400 mg) of sodium a day. If you have heart disease or high blood pressure, use less than ½?¾ tsp (1,500 mg) of sodium a day. °Lifestyle ° °Lose weight if needed and as told. Your health care provider can determine how much weight loss is best for you and can help you lose weight safely. °If you are overweight or obese, you may be told to lose at least 5?7% of your body weight. °Manage blood pressure, cholesterol, and stress. Your health care provider will help determine the best treatment for you. °Do not use any products that contain nicotine or tobacco. These products include cigarettes, chewing tobacco, and vaping devices, such as e-cigarettes. If you need help quitting, ask your health care provider. °Activity ° °Do physical   activity that makes your heart beat faster and makes you sweat (moderate intensity). Do this for at least 30 minutes on at least 5 days of the week, or as much as told by your health care provider. Ask your health care provider what activities are safe for you. A mix of activities may be best, such as walking, swimming,  cycling, and strength training. Try to add physical activity into your day. For example: Park your car farther away than usual so that you walk more. Take a walk during your lunch break. Use stairs instead of elevators or escalators. Walk or bike to work instead of driving. Alcohol use If you drink alcohol: Limit how much you have to: 0?1 drink a day for women who are not pregnant. 0?2 drinks a day for men. Know how much alcohol is in your drink. In the [place] one drink equals one 12 oz bottle of beer (355 mL), one 5 oz glass of wine (148 mL), or one 1 oz glass of hard liquor (44 mL). General information Talk with your health care provider about your risk factors and how you can reduce your risk for diabetes. Have your blood glucose tested regularly, as told by your health care provider. Get screening tests as told by your health care provider. You may have these regularly, especially if you have certain risk factors for type 2 diabetes. Make an appointment with a registered dietitian. This diet and nutrition specialist can help you make a healthy eating plan and help you understand portion sizes and food labels. Where to find support Ask your health care provider to recommend a registered dietitian, a certified diabetes care and education specialist, or a weight loss program. Look for local or online weight loss groups. Join a gym, fitness club, or outdoor activity group, such as a walking club. Where to find more information For help and guidance and to learn more about diabetes and diabetes prevention, visit: American Diabetes Association (ADA): www.diabetes.[employer] of Diabetes and Digestive and Kidney Diseases: [employer].dk To learn more about healthy eating, visit: U.S. Department of Agriculture Scientist, research (physical sciences)): FormerBoss.no Office of Disease Prevention and Health Promotion (ODPHP): [place] Summary You can delay or prevent type 2 diabetes by eating healthy  foods, losing weight if needed, and increasing your physical activity. Talk with your health care provider about your risk factors for type 2 diabetes and how you can reduce your risk. It can be difficult to recognize the signs of type 2 diabetes. The best way to avoid possible damage to your body is to take action to prevent the disease before you develop symptoms. Get screening tests as told by your health care provider. This information is not intended to replace advice given to you by your health care provider. Make sure you discuss any questions you have with your health care provider. Document Revised: 12/23/2020 Document Reviewed: 12/23/2020 Elsevier Patient Education  2022 [employer].  Call your insurance to see if they cover any weight loss medications (specifically [place] or [place])

## 2021-11-05 NOTE — Progress Notes (Signed)
I,Patricia Krause,acting as a Education administrator for [employer], FNP.,have documented all relevant documentation on the behalf of Patricia Brine, FNP,as directed by  Patricia Brine, FNP while in the presence of Patricia Krause, [place].  This visit occurred during the SARS-CoV-2 public health emergency.  Safety protocols were in place, including screening questions prior to the visit, additional usage of staff PPE, and extensive cleaning of exam room while observing appropriate contact time as indicated for disinfecting solutions.  Subjective:     Patient ID: Patricia Krause , female    DOB: 07/11/1976 , 46 y.o.   MRN: [id number]   Chief Complaint  Patient presents with   Prediabetes    HPI  Patient is here for prediabetes follow up. She would also like to discuss weight loss. She had rybelsus but her insurance did not cover. She has lymphedema all the time, she has seen the cardiologist. Next week she is going to vascular.  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 Has also had swelling to her legs for several years but is worsening. She also has a son who has health issues. She does see a therapist ([facility]). She does like peer support.   She has recently just moved. She talks to her therapist, audio books and spend time with her wife.  She has never taken phentermine for weight loss or any other medications. She has an IUD.   Wt Readings from Last 3 Encounters: 11/05/21 : 214 lb 9.6 oz (97.3 kg) 09/22/21 : 211 lb 6.4 oz (95.9 kg) 08/20/21 : 210 lb 9.6 oz (95.5 kg)      Past Medical History:  Diagnosis Date   Asthma    Dermatolysis    Hypertension    Lymphedema    dx by vascular doctor     Family History  Problem Relation Age of Onset   Diabetes Father    Cancer Father    High Cholesterol Brother    Diabetes Paternal Grandmother    Alzheimer's disease Paternal Grandfather    Diabetes Son    Autism Son      Current Outpatient Medications:    albuterol (VENTOLIN HFA) 108 (90  Base) MCG/ACT inhaler, Inhale 2 puffs into the lungs every 6 (six) hours as needed for wheezing or shortness of breath., Disp: 8 g, Rfl: 2   amLODipine (NORVASC) 5 MG tablet, Take 1 tablet (5 mg total) by mouth daily., Disp: 90 tablet, Rfl: 1   EPINEPHrine (EPIPEN 2-PAK) 0.3 mg/0.3 mL IJ SOAJ injection, Inject 0.3 mg into the muscle as needed for anaphylaxis., Disp: 1 each, Rfl: 0   fluticasone (FLONASE) 50 MCG/ACT nasal spray, Place 2 sprays into both nostrils daily., Disp: 97.9 mL, Rfl: 2   folic acid (FOLVITE) 1 MG tablet, Take 2 tablets (2 mg total) by mouth daily., Disp: 180 tablet, Rfl: 1   furosemide (LASIX) 20 MG tablet, Take 1 tablet (20 mg total) by mouth daily for 7 days., Disp: 7 tablet, Rfl: 0   hydrOXYzine (ATARAX) 25 MG tablet, Take 1 tablet (25 mg total) by mouth 3 (three) times daily as needed., Disp: 90 tablet, Rfl: 1   levonorgestrel (MIRENA) 20 MCG/DAY IUD, by Intrauterine route., Disp: , Rfl:    metFORMIN (GLUCOPHAGE XR) 500 MG 24 hr tablet, Take 1 tablet (500 mg total) by mouth daily with breakfast. (Patient not taking: Reported on 12/02/2021), Disp: 30 tablet, Rfl: 2   methotrexate (RHEUMATREX) 2.5 MG tablet, Take 6 tablets weekly for 2 weeks. Then increase  to 8 tablets weekly thereafter., Disp: 28 tablet, Rfl: 0   mupirocin ointment (BACTROBAN) 2 %, Apply 1 application topically 2 (two) times daily., Disp: 30 g, Rfl: 3   RYBELSUS 7 MG TABS, TAKE 1 TABLET BY MOUTH ONCE DAILY, Disp: 90 tablet, Rfl: 0   Vitamin D, Ergocalciferol, (DRISDOL) 1.25 MG (50000 UNIT) CAPS capsule, Take 1 capsule (50,000 Units total) by mouth every 7 (seven) days., Disp: 24 capsule, Rfl: 1   Allergies  Allergen Reactions   Cat Hair Extract Hives and Itching   Mixed Ragweed Other (See Comments)   Pollinex-T [Modified Tree Tyrosine Adsorbate] Other (See Comments)     Review of Systems  Constitutional: Negative.   Respiratory: Negative.    Cardiovascular: Negative.   Gastrointestinal: Negative.    Neurological: Negative.   Psychiatric/Behavioral: Negative.      Today's Vitals   11/05/21 1550  BP: 124/68  Pulse: 72  Temp: 98 F (36.7 C)  TempSrc: Oral  Weight: 214 lb 9.6 oz (97.3 kg)  Height: 5\' 6"  (1.676 m)   Body mass index is 34.64 kg/m.  Wt Readings from Last 3 Encounters:  12/02/21 213 lb (96.6 kg)  11/11/21 214 lb (97.1 kg)  11/06/21 213 lb (96.6 kg)    Objective:  Physical Exam Vitals reviewed.  Constitutional:      General: She is not in acute distress.    Appearance: Normal appearance. She is obese.  Cardiovascular:     Rate and Rhythm: Normal rate and regular rhythm.     Pulses: Normal pulses.     Heart sounds: Normal heart sounds. No murmur heard. Pulmonary:     Effort: Pulmonary effort is normal. No respiratory distress.     Breath sounds: Normal breath sounds. No wheezing.  Musculoskeletal:     Right lower leg: Edema (lymphedema) present.     Left lower leg: Edema (lymphedema) present.  Neurological:     General: No focal deficit present.     Mental Status: She is alert and oriented to person, place, and time.     Cranial Nerves: No cranial nerve deficit.     Motor: No weakness.  Psychiatric:        Mood and Affect: Mood normal.        Behavior: Behavior normal.        Thought Content: Thought content normal.        Judgment: Judgment normal.        Assessment And Plan:     1. Prediabetes Comments: Will add Rybelsus to see if this is more tolerable.  - Hemoglobin A1c  2. Insulin resistance Comments: Continue with metformin XR, will try Rybelsus pending insuranc approval - Insulin, random  3. Diarrhea, unspecified type Comments: likely releated to metformin better with XR but still daily.  4. Class 1 obesity due to excess calories without serious comorbidity with body mass index (BMI) of 34.0 to 34.9 in adult Comments: Some of her weight may be related to her lymphedema, encouraged to eat healthy diet low in sugar and carbs.  She is  encouraged to strive for BMI less than 30 to decrease cardiac risk. Advised to aim for at least 150 minutes of exercise per week.  5. Encounter for hepatitis C screening test for low risk patient Will check Hepatitis C screening due to recent recommendations to screen all adults 18 years and older - Hepatitis C antibody      Patient was given opportunity to ask questions. Patient verbalized understanding of  the plan and was able to repeat key elements of the plan. All questions were answered to their satisfaction.  Patricia Brine, FNP   I, Patricia Brine, FNP, have reviewed all documentation for this visit. The documentation on 11/05/21 for the exam, diagnosis, procedures, and orders are all accurate and complete.   IF YOU HAVE BEEN REFERRED TO A SPECIALIST, IT MAY TAKE 1-2 WEEKS TO SCHEDULE/PROCESS THE REFERRAL. IF YOU HAVE NOT HEARD FROM US/SPECIALIST IN TWO WEEKS, PLEASE GIVE [place] A CALL AT [phone].   THE PATIENT IS ENCOURAGED TO PRACTICE SOCIAL DISTANCING DUE TO THE COVID-19 PANDEMIC.

## 2021-11-06 ENCOUNTER — Ambulatory Visit: Payer: BC Managed Care – PPO | Admitting: Rheumatology

## 2021-11-06 ENCOUNTER — Ambulatory Visit: Payer: Self-pay

## 2021-11-06 ENCOUNTER — Encounter: Payer: Self-pay | Admitting: Nurse Practitioner

## 2021-11-06 ENCOUNTER — Encounter: Payer: Self-pay | Admitting: Rheumatology

## 2021-11-06 VITALS — BP 121/74 | HR 65 | Ht 68.0 in | Wt 213.0 lb

## 2021-11-06 DIAGNOSIS — M25471 Effusion, right ankle: Secondary | ICD-10-CM | POA: Diagnosis not present

## 2021-11-06 DIAGNOSIS — M79642 Pain in left hand: Secondary | ICD-10-CM | POA: Diagnosis not present

## 2021-11-06 DIAGNOSIS — R5383 Other fatigue: Secondary | ICD-10-CM | POA: Diagnosis not present

## 2021-11-06 DIAGNOSIS — I7300 Raynaud's syndrome without gangrene: Secondary | ICD-10-CM

## 2021-11-06 DIAGNOSIS — Z8709 Personal history of other diseases of the respiratory system: Secondary | ICD-10-CM | POA: Insufficient documentation

## 2021-11-06 DIAGNOSIS — E876 Hypokalemia: Secondary | ICD-10-CM

## 2021-11-06 DIAGNOSIS — I10 Essential (primary) hypertension: Secondary | ICD-10-CM | POA: Insufficient documentation

## 2021-11-06 DIAGNOSIS — R7303 Prediabetes: Secondary | ICD-10-CM | POA: Insufficient documentation

## 2021-11-06 DIAGNOSIS — Z79899 Other long term (current) drug therapy: Secondary | ICD-10-CM

## 2021-11-06 DIAGNOSIS — E559 Vitamin D deficiency, unspecified: Secondary | ICD-10-CM | POA: Insufficient documentation

## 2021-11-06 DIAGNOSIS — M79641 Pain in right hand: Secondary | ICD-10-CM | POA: Diagnosis not present

## 2021-11-06 DIAGNOSIS — M3310 Other dermatopolymyositis, organ involvement unspecified: Secondary | ICD-10-CM

## 2021-11-06 DIAGNOSIS — M25475 Effusion, left foot: Secondary | ICD-10-CM

## 2021-11-06 DIAGNOSIS — R600 Localized edema: Secondary | ICD-10-CM

## 2021-11-06 DIAGNOSIS — M25472 Effusion, left ankle: Secondary | ICD-10-CM

## 2021-11-06 DIAGNOSIS — M25474 Effusion, right foot: Secondary | ICD-10-CM

## 2021-11-06 DIAGNOSIS — R0602 Shortness of breath: Secondary | ICD-10-CM

## 2021-11-06 DIAGNOSIS — R21 Rash and other nonspecific skin eruption: Secondary | ICD-10-CM

## 2021-11-06 LAB — HEPATITIS C ANTIBODY: Hep C Virus Ab: <0.1 s/co ratio (ref 0.0–0.9)

## 2021-11-06 LAB — HEMOGLOBIN A1C
Est. average glucose Bld gHb Est-mCnc: 120 mg/dL
Hgb A1c MFr Bld: 5.8 % — ABNORMAL HIGH (ref 4.8–5.6)

## 2021-11-06 LAB — INSULIN, RANDOM: INSULIN: 41.5 u[IU]/mL — ABNORMAL HIGH (ref 2.6–24.9)

## 2021-11-10 ENCOUNTER — Encounter: Payer: Self-pay | Admitting: Nurse Practitioner

## 2021-11-10 ENCOUNTER — Ambulatory Visit: Payer: Medicaid Other | Admitting: Internal Medicine

## 2021-11-10 NOTE — Progress Notes (Signed)
[facility] [place]  ASSESSMENT / PLAN: 46 y.o. female with bilateral lower extremity primary lymphedema.  Counseled her about the frustrating, but benign nature of this diagnosis.  I encouraged her to wear compression garments if she can.  We will refer her [facility].  I am happy to follow her locally.  She can follow-up with me as needed.  CHIEF COMPLAINT: Swelling about the ankles  HISTORY OF PRESENT ILLNESS: Patricia Krause is a 46 y.o. female with fairly classic lymphedematous swelling about the lower extremities.  This is been going on for many years.  She first noticed symptoms develop when she was about 27.  She did not have a inciting event.  Patient is mostly bothered by swelling and heaviness in the legs which makes mobility difficult.  This is slowly progressed to the point where she strongly desires intervention.  I counseled her about the natural history of lymphedema.  I explained that we typically do not find a cause for her symptoms.  I explained that the mainstay of therapy is nonsurgical.  Past Medical History:  Diagnosis Date   Asthma    Dermatomyositis    Hypertension     Past Surgical History:  Procedure Laterality Date   BREAST SURGERY     WISDOM TOOTH EXTRACTION      Family History  Problem Relation Age of Onset   Diabetes Father    Cancer Father    High Cholesterol Brother    Diabetes Paternal Grandmother    Alzheimer's disease Paternal Grandfather    Diabetes Son    Autism Son     Social History   Socioeconomic History   Marital status: Married    Spouse name: Not on file   Number of children: Not on file   Years of education: Not on file   Highest education level: Not on file  Occupational History   Not on file  Tobacco Use   Smoking status: Never   Smokeless tobacco: Never  Vaping Use   Vaping Use: Never used  Substance and Sexual Activity   Alcohol use: Yes    Comment: socially   Drug use:  Never   Sexual activity: Yes    Birth control/protection: I.U.D.  Other Topics Concern   Not on file  Social History Narrative   Not on file   Social Determinants of Health   Financial Resource Strain: Not on file  Food Insecurity: Not on file  Transportation Needs: Not on file  Physical Activity: Not on file  Stress: Not on file  Social Connections: Not on file  Intimate Partner Violence: Not on file    Allergies  Allergen Reactions   Cat Hair Extract Hives and Itching   Mixed Ragweed Other (See Comments)   Pollinex-T [Modified Tree Tyrosine Adsorbate] Other (See Comments)    Current Outpatient Medications  Medication Sig Dispense Refill   albuterol (VENTOLIN HFA) 108 (90 Base) MCG/ACT inhaler Inhale 2 puffs into the lungs every 6 (six) hours as needed for wheezing or shortness of breath. 8 g 2   amLODipine (NORVASC) 5 MG tablet Take 1 tablet (5 mg total) by mouth daily. 90 tablet 1   EPINEPHrine (EPIPEN 2-PAK) 0.3 mg/0.3 mL IJ SOAJ injection Inject 0.3 mg into the muscle as needed for anaphylaxis. 1 each 0   Fluticasone Propionate, Inhal, 50 MCG/ACT AEPB Inhale 2 sprays into the lungs daily. 60 each 5   hydrOXYzine (ATARAX) 25 MG tablet Take 1 tablet (  25 mg total) by mouth 3 (three) times daily as needed. 90 tablet 1   levonorgestrel (MIRENA) 20 MCG/DAY IUD by Intrauterine route.     metFORMIN (GLUCOPHAGE XR) 500 MG 24 hr tablet Take 1 tablet (500 mg total) by mouth daily with breakfast. 30 tablet 2   mupirocin ointment (BACTROBAN) 2 % Apply 1 application topically 2 (two) times daily. 30 g 3   Semaglutide (RYBELSUS) 7 MG TABS Take 1 tablet by mouth daily. 90 tablet 0   Vitamin D, Ergocalciferol, (DRISDOL) 1.25 MG (50000 UNIT) CAPS capsule Take 1 capsule (50,000 Units total) by mouth every 7 (seven) days. 24 capsule 1   furosemide (LASIX) 20 MG tablet Take 1 tablet (20 mg total) by mouth daily for 7 days. 7 tablet 0   No current facility-administered medications for this  visit.    REVIEW OF SYSTEMS:  [X]  denotes positive finding, [ ]  denotes negative finding Cardiac  Comments:  Chest pain or chest pressure:    Shortness of breath upon exertion:    Short of breath when lying flat:    Irregular heart rhythm:        Vascular    Pain in calf, thigh, or hip brought on by ambulation:    Pain in feet at night that wakes you up from your sleep:     Blood clot in your veins:    Leg swelling:         Pulmonary    Oxygen at home:    Productive cough:     Wheezing:         Neurologic    Sudden weakness in arms or legs:     Sudden numbness in arms or legs:     Sudden onset of difficulty speaking or slurred speech:    Temporary loss of vision in one eye:     Problems with dizziness:         Gastrointestinal    Blood in stool:     Vomited blood:         Genitourinary    Burning when urinating:     Blood in urine:        Psychiatric    Major depression:         Hematologic    Bleeding problems:    Problems with blood clotting too easily:        Skin    Rashes or ulcers:        Constitutional    Fever or chills:      PHYSICAL EXAM Vitals:   11/11/21 1456  BP: 110/70  Pulse: 68  Resp: 20  Temp: 98.4 F (36.9 C)  SpO2: 100%  Weight: 214 lb (97.1 kg)  Height: 5\' 8"  (1.727 m)    Constitutional: well appearing. no distress. Appears well nourished.  Neurologic: CN intact. no focal findings. no sensory loss. Psychiatric:  Mood and affect symmetric and appropriate. Eyes:  No icterus. No conjunctival pallor. Ears, nose, throat:  mucous membranes moist. Midline trachea.  Cardiac: regular rate and rhythm.  Respiratory:  unlabored. Abdominal:  soft, non-tender, non-distended.  Peripheral vascular: classic lymphedema about distal thighs, knees, calves, ankles. Extremity: as above - lymphedema. no cyanosis. no pallor.  Skin: no gangrene. no ulceration.  Lymphatic: + Stemmer's sign. no palpable lymphadenopathy.  PERTINENT LABORATORY AND  RADIOLOGIC DATA  Most recent CBC CBC Latest Ref Rng & Units 11/06/2021 08/20/2021 07/23/2021  WBC 3.8 - 10.8 Thousand/uL 7.2 7.3 7.3  Hemoglobin 11.7 - 15.5  g/dL 11.1(L) 13.0 13.1  Hematocrit 35.0 - 45.0 % 32.3(L) 37.4 36.8  Platelets 140 - 400 Thousand/uL 259 290 273     Most recent CMP CMP Latest Ref Rng & Units 11/06/2021 11/06/2021 08/20/2021  Glucose 65 - 99 mg/dL - 95 110(H)  BUN 7 - 25 mg/dL - 10 12  Creatinine 0.50 - 0.99 mg/dL - 0.46(L) 0.46(L)  Sodium 135 - 146 mmol/L - 137 138  Potassium 3.5 - 5.3 mmol/L - 3.9 4.3  Chloride 98 - 110 mmol/L - 103 102  CO2 20 - 32 mmol/L - 26 22  Calcium 8.6 - 10.2 mg/dL - 9.2 9.7  Total Protein 6.1 - 8.1 g/dL 7.4 7.6 8.5  Total Bilirubin 0.2 - 1.2 mg/dL - 0.5 0.4  Alkaline Phos 44 - 121 IU/L - - 65  AST 10 - 30 U/L - 36(H) 34  ALT 6 - 29 U/L - 21 25    Renal function Estimated Creatinine Clearance: 110.5 mL/min (A) (by C-G formula based on SCr of 0.46 mg/dL (L)).  Hgb A1c MFr Bld (%)  Date Value  11/05/2021 5.8 (H)   Right lower extremity venous duplex:  - No evidence of deep vein thrombosis seen in the right lower extremity,  from the common femoral through the popliteal veins.  - No evidence of superficial venous thrombosis in the right lower  extremity.  - Deep vien reflux in the CFV, and proximal FV.  - Superficial vein reflux in the SSV, SFJ, and GSV.  - Vascularized lymph node seen in the groin.   Yevonne Aline. Stanford Breed, MD [facility] Phone Number: [phone] 11/11/2021 3:45 PM  Total time spent on preparing this encounter including chart review, data review, collecting history, examining the patient, coordinating care for this new patient, 45 minutes.  Portions of this report may have been transcribed using voice recognition software.  Every effort has been made to ensure accuracy; however, inadvertent computerized transcription errors may still be present.

## 2021-11-11 ENCOUNTER — Encounter: Payer: Self-pay | Admitting: Vascular Surgery

## 2021-11-11 ENCOUNTER — Ambulatory Visit (HOSPITAL_COMMUNITY)
Admission: RE | Admit: 2021-11-11 | Discharge: 2021-11-11 | Disposition: A | Discharge status: Home / Self Care | Payer: BC Managed Care – PPO | Source: Ambulatory Visit | Attending: Vascular Surgery | Admitting: Vascular Surgery

## 2021-11-11 ENCOUNTER — Ambulatory Visit (INDEPENDENT_AMBULATORY_CARE_PROVIDER_SITE_OTHER): Payer: BC Managed Care – PPO | Admitting: Vascular Surgery

## 2021-11-11 ENCOUNTER — Other Ambulatory Visit: Payer: Self-pay

## 2021-11-11 VITALS — BP 110/70 | HR 68 | Temp 98.4°F | Resp 20 | Ht 68.0 in | Wt 214.0 lb

## 2021-11-11 DIAGNOSIS — I890 Lymphedema, not elsewhere classified: Secondary | ICD-10-CM | POA: Diagnosis not present

## 2021-11-13 ENCOUNTER — Other Ambulatory Visit: Payer: Self-pay

## 2021-11-13 MED ORDER — MUPIROCIN 2 % EX OINT: 1.0000 "application " | TOPICAL_OINTMENT | Freq: Two times a day (BID) | CUTANEOUS | 3 refills | Status: DC

## 2021-11-13 MED ORDER — FLUTICASONE PROPIONATE (INHAL) 50 MCG/ACT IN AEPB: 2.0000 | INHALATION_SPRAY | Freq: Every day | RESPIRATORY_TRACT | 5 refills | Status: DC

## 2021-11-17 ENCOUNTER — Encounter: Payer: Self-pay | Admitting: Nurse Practitioner

## 2021-11-17 LAB — ANTI-SCLERODERMA ANTIBODY: Scleroderma (Scl-70) (ENA) Antibody, IgG: <1 AI

## 2021-11-17 LAB — URINALYSIS, ROUTINE W REFLEX MICROSCOPIC
Bacteria, UA: NONE SEEN /HPF
Bilirubin Urine: NEGATIVE
Glucose, UA: NEGATIVE
Hgb urine dipstick: NEGATIVE
Hyaline Cast: NONE SEEN /LPF
Ketones, ur: NEGATIVE
Nitrite: NEGATIVE
Protein, ur: NEGATIVE
RBC / HPF: NONE SEEN /HPF (ref 0–2)
Specific Gravity, Urine: 1.011 (ref 1.001–1.035)
pH: 7 (ref 5.0–8.0)

## 2021-11-17 LAB — HIV ANTIBODY (ROUTINE TESTING W REFLEX): HIV 1&2 Ab, 4th Generation: NONREACTIVE

## 2021-11-17 LAB — SJOGRENS SYNDROME-A EXTRACTABLE NUCLEAR ANTIBODY: SSA (Ro) (ENA) Antibody, IgG: <1 AI

## 2021-11-17 LAB — ANTI-DNA ANTIBODY, DOUBLE-STRANDED: ds DNA Ab: <1 IU/mL

## 2021-11-17 LAB — MYOSITIS ASSESSR PLUS JO-1 AUTOABS
EJ Autoabs: NOT DETECTED
Jo-1 Autoabs: <1 AI
Ku Autoabs: NOT DETECTED
Mi-2 Autoabs: NOT DETECTED
OJ Autoabs: NOT DETECTED
PL-12 Autoabs: NOT DETECTED
PL-7 Autoabs: NOT DETECTED
SRP Autoabs: NOT DETECTED

## 2021-11-17 LAB — QUANTIFERON-TB GOLD PLUS
Mitogen-NIL: >10 IU/mL
NIL: 0.04 IU/mL
QuantiFERON-TB Gold Plus: NEGATIVE
TB1-NIL: 0 IU/mL
TB2-NIL: 0 IU/mL

## 2021-11-17 LAB — CBC WITH DIFFERENTIAL/PLATELET
Absolute Monocytes: 670 cells/uL (ref 200–950)
Basophils Absolute: 29 cells/uL (ref 0–200)
Basophils Relative: 0.4 %
Eosinophils Absolute: 878 cells/uL — ABNORMAL HIGH (ref 15–500)
Eosinophils Relative: 12.2 %
HCT: 32.3 % — ABNORMAL LOW (ref 35.0–45.0)
Hemoglobin: 11.1 g/dL — ABNORMAL LOW (ref 11.7–15.5)
Lymphs Abs: 2160 cells/uL (ref 850–3900)
MCH: 33 pg (ref 27.0–33.0)
MCHC: 34.4 g/dL (ref 32.0–36.0)
MCV: 96.1 fL (ref 80.0–100.0)
MPV: 10.2 fL (ref 7.5–12.5)
Monocytes Relative: 9.3 %
Neutro Abs: 3463 cells/uL (ref 1500–7800)
Neutrophils Relative %: 48.1 %
Platelets: 259 10*3/uL (ref 140–400)
RBC: 3.36 10*6/uL — ABNORMAL LOW (ref 3.80–5.10)
RDW: 12.3 % (ref 11.0–15.0)
Total Lymphocyte: 30 %
WBC: 7.2 10*3/uL (ref 3.8–10.8)

## 2021-11-17 LAB — COMPLETE METABOLIC PANEL WITH GFR
AG Ratio: 1.1 (calc) (ref 1.0–2.5)
ALT: 21 U/L (ref 6–29)
AST: 36 U/L — ABNORMAL HIGH (ref 10–30)
Albumin: 3.9 g/dL (ref 3.6–5.1)
Alkaline phosphatase (APISO): 42 U/L (ref 31–125)
BUN/Creatinine Ratio: 22 (calc) (ref 6–22)
BUN: 10 mg/dL (ref 7–25)
CO2: 26 mmol/L (ref 20–32)
Calcium: 9.2 mg/dL (ref 8.6–10.2)
Chloride: 103 mmol/L (ref 98–110)
Creat: 0.46 mg/dL — ABNORMAL LOW (ref 0.50–0.99)
Globulin: 3.7 g/dL (calc) (ref 1.9–3.7)
Glucose, Bld: 95 mg/dL (ref 65–99)
Potassium: 3.9 mmol/L (ref 3.5–5.3)
Sodium: 137 mmol/L (ref 135–146)
Total Bilirubin: 0.5 mg/dL (ref 0.2–1.2)
Total Protein: 7.6 g/dL (ref 6.1–8.1)
eGFR: 122 mL/min/{1.73_m2} (ref >=60)

## 2021-11-17 LAB — C3 AND C4
C3 Complement: 154 mg/dL (ref 83–193)
C4 Complement: 25 mg/dL (ref 15–57)

## 2021-11-17 LAB — CK: Total CK: 766 U/L — ABNORMAL HIGH (ref 29–143)

## 2021-11-17 LAB — ANTI-SMITH ANTIBODY: ENA SM Ab Ser-aCnc: <1 AI

## 2021-11-17 LAB — PROTEIN ELECTROPHORESIS, SERUM, WITH REFLEX
Albumin ELP: 3.8 g/dL (ref 3.8–4.8)
Alpha 1: 0.3 g/dL (ref 0.2–0.3)
Alpha 2: 0.6 g/dL (ref 0.5–0.9)
Beta 2: 0.4 g/dL (ref 0.2–0.5)
Beta Globulin: 0.4 g/dL (ref 0.4–0.6)
Gamma Globulin: 1.9 g/dL — ABNORMAL HIGH (ref 0.8–1.7)
Total Protein: 7.4 g/dL (ref 6.1–8.1)

## 2021-11-17 LAB — IGG, IGA, IGM
IgG (Immunoglobin G), Serum: 2286 mg/dL — ABNORMAL HIGH (ref 600–1640)
IgM, Serum: 42 mg/dL — ABNORMAL LOW (ref 50–300)
Immunoglobulin A: 207 mg/dL (ref 47–310)

## 2021-11-17 LAB — CYCLIC CITRUL PEPTIDE ANTIBODY, IGG: Cyclic Citrullin Peptide Ab: 18 UNITS

## 2021-11-17 LAB — TSH: TSH: 5.65 mIU/L — ABNORMAL HIGH

## 2021-11-17 LAB — SJOGRENS SYNDROME-B EXTRACTABLE NUCLEAR ANTIBODY: SSB (La) (ENA) Antibody, IgG: <1 AI

## 2021-11-17 LAB — HEPATITIS B CORE ANTIBODY, IGM: Hep B C IgM: NONREACTIVE

## 2021-11-17 LAB — ALDOLASE: Aldolase: 10.9 U/L — ABNORMAL HIGH (ref <=8.1)

## 2021-11-17 LAB — SEDIMENTATION RATE: Sed Rate: 28 mm/h — ABNORMAL HIGH (ref 0–20)

## 2021-11-17 LAB — RHEUMATOID FACTOR: Rheumatoid fact SerPl-aCnc: <14 IU/mL (ref <14)

## 2021-11-17 LAB — HEPATITIS B SURFACE ANTIGEN: Hepatitis B Surface Ag: NONREACTIVE

## 2021-11-17 LAB — ANTI-NUCLEAR AB-TITER (ANA TITER): ANA Titer 1: 1:80 {titer} — ABNORMAL HIGH

## 2021-11-17 LAB — ANA: Anti Nuclear Antibody (ANA): POSITIVE — AB

## 2021-11-17 LAB — RNP ANTIBODY: Ribonucleic Protein(ENA) Antibody, IgG: <1 AI

## 2021-11-18 ENCOUNTER — Other Ambulatory Visit: Payer: Self-pay | Admitting: Nurse Practitioner

## 2021-11-18 MED ORDER — FLUTICASONE PROPIONATE 50 MCG/ACT NA SUSP: 2.0000 | Freq: Every day | NASAL | 2 refills | Status: DC

## 2021-11-18 NOTE — Progress Notes (Signed)
I will discuss results at the follow-up visit.

## 2021-11-18 NOTE — Progress Notes (Signed)
Please check the status of her high-resolution CT.  It will help [place] determine her future treatment.  Please schedule an earlier appointment if possible after the CT scan.

## 2021-11-21 DIAGNOSIS — L905 Scar conditions and fibrosis of skin: Secondary | ICD-10-CM | POA: Diagnosis not present

## 2021-11-21 DIAGNOSIS — I890 Lymphedema, not elsewhere classified: Secondary | ICD-10-CM | POA: Diagnosis not present

## 2021-11-21 DIAGNOSIS — R2689 Other abnormalities of gait and mobility: Secondary | ICD-10-CM | POA: Diagnosis not present

## 2021-11-23 ENCOUNTER — Other Ambulatory Visit: Payer: Self-pay | Admitting: Nurse Practitioner

## 2021-11-23 DIAGNOSIS — E8881 Metabolic syndrome: Secondary | ICD-10-CM

## 2021-11-23 DIAGNOSIS — R7303 Prediabetes: Secondary | ICD-10-CM

## 2021-11-24 ENCOUNTER — Encounter: Payer: Self-pay | Admitting: Vascular Surgery

## 2021-11-24 NOTE — Progress Notes (Signed)
Office Visit Note  Patient: Patricia Krause             Date of Birth: 1976-05-12           MRN: [id number]             PCP: [name], FNP Referring: Bary Castilla, NP Visit Date: 12/02/2021 Occupation: '@GUAROCC' @  Subjective:  Medication management  History of Present Illness: Patricia Krause is a 46 y.o. female with history of dermatomyositis, Raynauds and calcinosis.  She states she continues to have some generalized muscle weakness.  She denies any muscle pain.  She has not had any recent rash.  She continues to have stiffness in the hands and decreased grip strength.  She has not had any recent problems with Raynauds.  She complains of shortness of breath.  Activities of Daily Living:  Patient reports morning stiffness for a few minutes.   Patient Denies nocturnal pain.  Difficulty dressing/grooming: Reports Difficulty climbing stairs: Reports Difficulty getting out of chair: Reports Difficulty using hands for taps, buttons, cutlery, and/or writing: Reports  Review of Systems  Constitutional:  Positive for fatigue.  HENT:  Positive for nosebleeds and nose dryness. Negative for mouth sores and mouth dryness.   Eyes:  Negative for pain, itching and dryness.  Respiratory:  Negative for shortness of breath and difficulty breathing.   Cardiovascular:  Positive for swelling in legs/feet. Negative for chest pain and palpitations.  Gastrointestinal:  Negative for blood in stool, constipation and diarrhea.  Endocrine: Negative for increased urination.  Genitourinary:  Negative for difficulty urinating.  Musculoskeletal:  Positive for myalgias, morning stiffness, muscle tenderness and myalgias. Negative for joint pain, joint pain and joint swelling.  Skin:  Negative for color change, rash, redness and sensitivity to sunlight.  Allergic/Immunologic: Negative for susceptible to infections.  Neurological:  Positive for headaches. Negative for dizziness, numbness, memory loss and  weakness.  Hematological:  Negative for bruising/bleeding tendency.  Psychiatric/Behavioral:  Negative for depressed mood, confusion and sleep disturbance. The patient is not nervous/anxious.    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[place]) 12/02/2021   Raynaud's syndrome without gangrene 12/02/2021   History of asthma 11/06/2021   Vitamin D deficiency 11/06/2021   Prediabetes 11/06/2021   Essential hypertension 11/06/2021   Hypokalemia 08/20/2021    Past Medical History:  Diagnosis Date   Asthma    Dermatolysis    Hypertension    Lymphedema    dx by vascular doctor    Family History  Problem Relation Age of Onset   Diabetes Father    Cancer Father    High Cholesterol Brother    Diabetes Paternal Grandmother    Alzheimer's disease Paternal Grandfather    Diabetes Son    Autism Son    Past Surgical History:  Procedure Laterality Date   BREAST SURGERY     WISDOM TOOTH EXTRACTION     Social History   Social History Narrative   Not on file   Immunization History  Administered Date(s) Administered   Influenza,inj,Quad PF,6+ Mos 07/23/2021   PFIZER(Purple Top)SARS-COV-2 Vaccination 12/29/2019, 01/23/2020, 08/02/2020   Pfizer Covid-19 Vaccine Bivalent Booster 79yr & up 07/05/2021   Pneumococcal Polysaccharide-23 08/20/2021   Tdap 08/20/2021     Objective: Vital Signs: BP 110/73 (BP Location: Left Arm, Patient Position: Sitting, Cuff Size: Normal)    Pulse 79    Ht '5\' 8"'  (1.727 m)    Wt 213 lb (  96.6 kg)    BMI 32.39 kg/m    Physical Exam Vitals and nursing note reviewed.  Constitutional:      Appearance: She is well-developed.  HENT:     Head: Normocephalic and atraumatic.  Eyes:     Conjunctiva/sclera: Conjunctivae normal.  Cardiovascular:     Rate and Rhythm: Normal rate and regular rhythm.     Heart sounds: Normal heart sounds.  Pulmonary:     Effort: Pulmonary effort is normal.     Breath sounds:  Normal breath sounds.  Abdominal:     General: Bowel sounds are normal.     Palpations: Abdomen is soft.  Musculoskeletal:     Cervical back: Normal range of motion.     Right lower leg: Edema present.     Left lower leg: Edema present.     Comments: Bilateral lymphedema noted.  Lymphadenopathy:     Cervical: No cervical adenopathy.  Skin:    General: Skin is warm and dry.     Capillary Refill: Capillary refill takes less than 2 seconds.     Comments: Calcinosis, poikiloderma, sclerodactyly, nailbed capillary changes and loss of digital tufts was noted.   Neurological:     Mental Status: She is alert and oriented to person, place, and time.  Psychiatric:        Behavior: Behavior normal.     Musculoskeletal Exam: C-spine was in good range of motion.  She had limited range of motion of bilateral shoulders with abduction of 220 degrees.  She had bilateral elbow joint contractures.  Wrist joints are in good range of motion without synovitis.  She had incomplete fist formation due to contractures in PIP and DIP and eschared actively.  Loss of digital tufts was noted.  Hip joints and knee joints were in good range of motion.  She had bilateral lymphedema and pedal edema.  There was no tenderness over ankles or MTPs.  CDAI Exam: CDAI Score: -- Patient Global: --; Provider Global: -- Swollen: --; Tender: -- Joint Exam 12/02/2021   No joint exam has been documented for this visit   There is currently no information documented on the homunculus. Go to the Rheumatology activity and complete the homunculus joint exam.  Investigation: No additional findings.  Imaging: CT Chest High Resolution  Result Date: 11/29/2021 CLINICAL DATA:  46 year old female with history of shortness of breath. Adult onset dermatomyositis. Evaluate for interstitial lung disease. EXAM: CT CHEST WITHOUT CONTRAST TECHNIQUE: Multidetector CT imaging of the chest was performed following the standard protocol without  intravenous contrast. High resolution imaging of the lungs, as well as inspiratory and expiratory imaging, was performed. RADIATION DOSE REDUCTION: This exam was performed according to the departmental dose-optimization program which includes automated exposure control, adjustment of the mA and/or kV according to patient size and/or use of iterative reconstruction technique. COMPARISON:  No priors. FINDINGS: Cardiovascular: Heart size is normal. There is no significant pericardial fluid, thickening or pericardial calcification. No atherosclerotic calcifications are noted in the thoracic aorta or the coronary arteries. Mediastinum/Nodes: No pathologically enlarged mediastinal or hilar lymph nodes. Borderline enlarged subcarinal lymph node measuring up to 12 mm in short axis, nonspecific. Please note that accurate exclusion of hilar adenopathy is limited on noncontrast CT scans. Esophagus is unremarkable in appearance. No axillary lymphadenopathy. Lungs/Pleura: A few patchy areas of thickening of the peribronchovascular interstitium, surrounding septal thickening and regional architectural distortion are noted, most evident in the inferior aspects of the lower lobes of the lungs bilaterally, and  in the right upper lobe near the apex. No traction bronchiectasis or honeycombing. Inspiratory and expiratory imaging demonstrates moderate air trapping indicative of small airways disease. No acute consolidative airspace disease. No pleural effusions. No definite suspicious appearing pulmonary nodules or masses are noted. Upper Abdomen: Unremarkable. Musculoskeletal: There are no aggressive appearing lytic or blastic lesions noted in the visualized portions of the skeleton. IMPRESSION: 1. There are some very mild fibrotic changes in the lungs, which are nonspecific in appearance, at this time categorized as most compatible with an alternative diagnosis (not usual interstitial pneumonia) per current ATS guidelines. Given the  unusual distribution, these are favored to represent areas of mild post infectious or inflammatory scarring. However, if there is persistent clinical concern for interstitial lung disease, repeat high-resolution chest CT should be considered in 12 months to assess for temporal changes in the appearance of the lung parenchyma. 2. Moderate air trapping indicative of small airways disease. Electronically Signed   By: Vinnie Langton M.D.   On: 11/29/2021 09:58   XR Hand 2 View Left  Result Date: 11/06/2021 No CMC, MCP, PIP or DIP narrowing was noted.  No intercarpal or radiocarpal joint space narrowing was noted.  No erosive changes were noted. Impression: Unremarkable x-ray of the hand.  XR Hand 2 View Right  Result Date: 11/06/2021 No MCP, PIP or DIP narrowing was noted.  No intercarpal or radiocarpal joint space narrowing was noted.  Extra-articular calcification was noted next to the first proximal phalanx. Impression: Unremarkable x-rays of the hand.  VAS [place] LOWER EXTREMITY VENOUS REFLUX  Result Date: 11/12/2021  Lower Venous Reflux Study Patient Name:  LANASIA PORRAS  Date of Exam:   11/11/2021 Medical Rec #: [id number]       Accession #:    [id number] Date of Birth: 04-Aug-1976       Patient Gender: F Patient Age:   19 years Exam Location:  [facility] Procedure:      VAS [place] LOWER EXTREMITY VENOUS REFLUX Referring Phys: Jamelle Haring --------------------------------------------------------------------------------  Indications: Edema, and venous insufficiency, lymph edema.  Performing Technologist: Ralene Cork RVT  Examination Guidelines: A complete evaluation includes B-mode imaging, spectral Doppler, color Doppler, and power Doppler as needed of all accessible portions of each vessel. Bilateral testing is considered an integral part of a complete examination. Limited examinations for reoccurring indications may be performed as noted. The reflux portion of the exam is performed with  the patient in reverse Trendelenburg. Significant venous reflux is defined as >500 ms in the superficial venous system, and >1 second in the deep venous system.  Venous Reflux Times +--------------+---------+------+-----------+------------+------------------+  RIGHT          Reflux No Reflux Reflux Time Diameter cms Comments                                       Yes                                                +--------------+---------+------+-----------+------------+------------------+  CFV                       yes    >1 second                                   +--------------+---------+------+-----------+------------+------------------+  FV prox                   yes    >1 second                                   +--------------+---------+------+-----------+------------+------------------+  FV mid         no                                                            +--------------+---------+------+-----------+------------+------------------+  FV dist        no                                                            +--------------+---------+------+-----------+------------+------------------+  Popliteal      no                                        939 ms              +--------------+---------+------+-----------+------------+------------------+  GSV at SFJ                yes     >500 ms       0.86                         +--------------+---------+------+-----------+------------+------------------+  GSV prox thigh no                              0.554                         +--------------+---------+------+-----------+------------+------------------+  GSV mid thigh             yes     >500 ms      0.487                         +--------------+---------+------+-----------+------------+------------------+  GSV dist thigh            yes     >500 ms       0.53                         +--------------+---------+------+-----------+------------+------------------+  GSV at knee               yes     >500 ms       0.403                         +--------------+---------+------+-----------+------------+------------------+  GSV prox calf                                            multiple branching  +--------------+---------+------+-----------+------------+------------------+  SSV Pop Fossa  no                 >500 ms       1.17                         +--------------+---------+------+-----------+------------+------------------+  SSV prox calf  no                 >500 ms      0.869                         +--------------+---------+------+-----------+------------+------------------+  SSV mid calf   no                 >500 ms       0.84                         +--------------+---------+------+-----------+------------+------------------+  Summary: Right: - No evidence of deep vein thrombosis seen in the right lower extremity, from the common femoral through the popliteal veins. - No evidence of superficial venous thrombosis in the right lower extremity.  - Deep vien reflux in the CFV, and proximal FV. - Superficial vein reflux in the SSV, SFJ, and GSV. - Vascularized lymph node seen in the groin. *See table(s) above for measurements and observations. Electronically signed by Jamelle Haring on 11/12/2021 at 3:32:05 PM.    Final     Recent Labs: Lab Results  Component Value Date   WBC 7.2 11/06/2021   HGB 11.1 (L) 11/06/2021   PLT 259 11/06/2021   NA 137 11/06/2021   K 3.9 11/06/2021   CL 103 11/06/2021   CO2 26 11/06/2021   GLUCOSE 95 11/06/2021   BUN 10 11/06/2021   CREATININE 0.46 (L) 11/06/2021   BILITOT 0.5 11/06/2021   ALKPHOS 65 08/20/2021   AST 36 (H) 11/06/2021   ALT 21 11/06/2021   PROT 7.6 11/06/2021   PROT 7.4 11/06/2021   ALBUMIN 4.6 08/20/2021   CALCIUM 9.2 11/06/2021   GFRAA >60 12/28/2019   QFTBGOLDPLUS NEGATIVE 11/06/2021   November 06, 2021 UA negative.  HIV negative, hepatitis B-, TB Gold negative, IgG elevated, IgM low, SPEP normal, ANA 1: 80NH, ENA negative, C3-C4 normal, RF negative,  anti-CCP negative, ESR 28,  CK766, aldolase 10.9, myositis panel negative, TSH 5.65   Speciality Comments: Dx in 2005-presented postpartum '04. Left bicep muscle bx 2005 + skin bx+. IVIG in the past.Ttd with MTX, PLQ and MMF. 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 Left bicep muscle bx 2005 + skin bx+. IVIG in the pastMuscle wasting, poikiloderma, sclerodactyly, calcinosis, capillary  -I reviewed recent lab work with her.  Her CK is elevated.  LFTs are mildly elevated most likely due to elevated CK.  Aldolase was also elevated.  Myositis panel was negative.  She was treated with multiple medications in the past.  She has not been on any treatment since 2018.  Had a detailed discussion with the patient.  I do not see a need to start her on prednisone as she has been off treatment for many years and is not experiencing increased muscle weakness or tenderness.  I will start her on methotrexate and add  medications as needed.  She had been on triple therapy in the past.  I plans to start her on methotrexate 2.5 mg tablets 6 tablets p.o. weekly.  We will check labs in 2 weeks and if stable then increase it to 8 tablets p.o. weekly.  She will take folic acid 2 mg p.o. daily.  She was given Plaquenil in the past due to rash.  She has not noted any rash.  Indications side effects contraindications of methotrexate were discussed at length.  I am uncertain if she had malignancy work-up in the past.  We will place referral to GYN for vaginal ultrasound and mammogram, GI for colonoscopy.  She will be also referred to pulmonary.  High-resolution CT did not show interstitial lung disease.  She had cardiology work-up recently.  I would recommend yearly cardiology evaluation noted.  Plan: CK,  Ambulatory referral to Pulmonology, Ambulatory referral to Gynecology, Ambulatory referral to Gastroenterology, CT ABDOMEN PELVIS W CONTRAST  Drug Counseling TB Gold: November 06, 2021 Hepatitis panel: November 06, 2021  Chest-xray: High-resolution CT November 29, 2021  Contraception: Mirena IUD  Alcohol use: None  Patient was counseled on the purpose, proper use, and adverse effects of methotrexate including nausea, infection, and signs and symptoms of pneumonitis.  Reviewed instructions with patient to take methotrexate weekly along with folic acid daily.  Discussed the importance of frequent monitoring of kidney and liver function and blood counts, and provided patient with standing lab instructions.  Counseled patient to avoid NSAIDs and alcohol while on methotrexate.  Provided patient with educational materials on methotrexate and answered all questions.  Advised patient to get annual influenza vaccine and to get a pneumococcal vaccine if patient has not already had one.  Patient voiced understanding.  Patient consented to methotrexate use.  Will upload into chart.     Scleroderma ([place]) -her serology was negative for scleroderma.  Although her clinical features are consistent with scleroderma with sclerodactyly, skin thickening, calcinosis, nailbed capillary changes.  Skin tightness was noted below elbow, on her chest and her face.  Plan: Ambulatory referral to Pulmonology  High risk medication use -in the past she was treated with MTX 8 tablets weekly, plaquenil 200 mg 1 tablet by mouth twice daily, and MMF until 2018 at [place].  IVIG in the past. - Plan: CBC with Differential/Platelet, COMPLETE METABOLIC PANEL WITH GFR  Raynaud's syndrome without gangrene - Treated with sildenafil and nifedipine in the past.  She is on amlodipine now.  No digital ulcers were noted.  Decreased capillary refill was noted.  SCL 70 neg  Shortness of breath - High-resolution CT showed some fibrosis but no  features of ILD.  Findings were discussed with the patient at length.  Will refer her to pulmonary   Pain in both hands - No synovitis was noted.  X-rays were unremarkable.  X-ray findings were discussed with the patient.  She has incomplete fist formation and decreased grip strength due to sclerodactyly.  Bilateral swelling of feet and ankles - No synovitis was noted.  She had significant pedal edema and lymphedema.  She will be going to the lymphedema clinic.  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  She also has an appointment with vascular surgery for pedal edema.  Vitamin D deficiency-she takes vitamin D on a regular basis.  History of asthma  Essential hypertension  Other fatigue-her TSH was elevated.  Have advised her to schedule an appointment with her PCP  for the treatment of hypothyroidism if needed.  Orders: Orders Placed This Encounter  Procedures   CT ABDOMEN PELVIS W CONTRAST   CBC with Differential/Platelet   COMPLETE METABOLIC PANEL WITH GFR   CK   Ambulatory referral to Pulmonology   Ambulatory referral to Gynecology   Ambulatory referral to Gastroenterology   Meds ordered this encounter  Medications   methotrexate (RHEUMATREX) 2.5 MG tablet    Sig: Take 6 tablets weekly for 2 weeks. Then increase to 8 tablets weekly thereafter.    Dispense:  28 tablet    Refill:  0   folic acid (FOLVITE) 1 MG tablet    Sig: Take 2 tablets (2 mg total) by mouth daily.    Dispense:  180 tablet    Refill:  1     Follow-Up Instructions: Return in about 6 weeks (around 01/13/2022) for DM.   Bo Merino, MD  Note - This record has been created using Editor, commissioning.  Chart creation errors have been sought, but may not always  have been located. Such creation errors do not reflect on  the standard of medical care.

## 2021-11-25 NOTE — Telephone Encounter (Signed)
Are you able to help with this

## 2021-11-26 ENCOUNTER — Other Ambulatory Visit: Payer: Self-pay

## 2021-11-26 ENCOUNTER — Encounter: Payer: Self-pay | Admitting: Nurse Practitioner

## 2021-11-26 ENCOUNTER — Encounter: Payer: Self-pay | Admitting: Interventional Cardiology

## 2021-11-26 MED ORDER — AMLODIPINE BESYLATE 5 MG PO TABS: 5.0000 mg | ORAL_TABLET | Freq: Every day | ORAL | 1 refills | Status: DC

## 2021-11-27 ENCOUNTER — Ambulatory Visit (HOSPITAL_COMMUNITY)
Admission: RE | Admit: 2021-11-27 | Discharge: 2021-11-27 | Disposition: A | Discharge status: Home / Self Care | Payer: BC Managed Care – PPO | Source: Ambulatory Visit | Attending: Rheumatology | Admitting: Rheumatology

## 2021-11-27 ENCOUNTER — Other Ambulatory Visit: Payer: Self-pay

## 2021-11-27 DIAGNOSIS — R590 Localized enlarged lymph nodes: Secondary | ICD-10-CM | POA: Diagnosis not present

## 2021-11-27 DIAGNOSIS — J8410 Pulmonary fibrosis, unspecified: Secondary | ICD-10-CM | POA: Diagnosis not present

## 2021-11-27 DIAGNOSIS — M3310 Other dermatopolymyositis, organ involvement unspecified: Secondary | ICD-10-CM | POA: Insufficient documentation

## 2021-11-27 DIAGNOSIS — R918 Other nonspecific abnormal finding of lung field: Secondary | ICD-10-CM | POA: Diagnosis not present

## 2021-11-27 DIAGNOSIS — J929 Pleural plaque without asbestos: Secondary | ICD-10-CM | POA: Diagnosis not present

## 2021-12-02 ENCOUNTER — Ambulatory Visit: Payer: BC Managed Care – PPO | Admitting: Rheumatology

## 2021-12-02 ENCOUNTER — Encounter: Payer: Self-pay | Admitting: Rheumatology

## 2021-12-02 ENCOUNTER — Other Ambulatory Visit: Payer: Self-pay

## 2021-12-02 VITALS — BP 110/73 | HR 79 | Ht 68.0 in | Wt 213.0 lb

## 2021-12-02 DIAGNOSIS — M25475 Effusion, left foot: Secondary | ICD-10-CM

## 2021-12-02 DIAGNOSIS — M3310 Other dermatopolymyositis, organ involvement unspecified: Secondary | ICD-10-CM | POA: Insufficient documentation

## 2021-12-02 DIAGNOSIS — Z8709 Personal history of other diseases of the respiratory system: Secondary | ICD-10-CM

## 2021-12-02 DIAGNOSIS — M79641 Pain in right hand: Secondary | ICD-10-CM

## 2021-12-02 DIAGNOSIS — M25471 Effusion, right ankle: Secondary | ICD-10-CM

## 2021-12-02 DIAGNOSIS — R600 Localized edema: Secondary | ICD-10-CM

## 2021-12-02 DIAGNOSIS — R21 Rash and other nonspecific skin eruption: Secondary | ICD-10-CM

## 2021-12-02 DIAGNOSIS — Z79899 Other long term (current) drug therapy: Secondary | ICD-10-CM | POA: Diagnosis not present

## 2021-12-02 DIAGNOSIS — R5383 Other fatigue: Secondary | ICD-10-CM

## 2021-12-02 DIAGNOSIS — R0602 Shortness of breath: Secondary | ICD-10-CM

## 2021-12-02 DIAGNOSIS — M349 Systemic sclerosis, unspecified: Secondary | ICD-10-CM

## 2021-12-02 DIAGNOSIS — I10 Essential (primary) hypertension: Secondary | ICD-10-CM

## 2021-12-02 DIAGNOSIS — I7300 Raynaud's syndrome without gangrene: Secondary | ICD-10-CM | POA: Diagnosis not present

## 2021-12-02 DIAGNOSIS — E559 Vitamin D deficiency, unspecified: Secondary | ICD-10-CM

## 2021-12-02 DIAGNOSIS — M79642 Pain in left hand: Secondary | ICD-10-CM

## 2021-12-02 DIAGNOSIS — M25472 Effusion, left ankle: Secondary | ICD-10-CM

## 2021-12-02 DIAGNOSIS — R7303 Prediabetes: Secondary | ICD-10-CM

## 2021-12-02 DIAGNOSIS — M25474 Effusion, right foot: Secondary | ICD-10-CM

## 2021-12-02 MED ORDER — FOLIC ACID 1 MG PO TABS: 2.0000 mg | ORAL_TABLET | Freq: Every day | ORAL | 1 refills | Status: DC

## 2021-12-02 MED ORDER — METHOTREXATE 2.5 MG PO TABS: ORAL_TABLET | ORAL | 0 refills | Status: DC

## 2021-12-02 NOTE — Patient Instructions (Signed)
Methotrexate Tablets What is this medication? METHOTREXATE (METH oh TREX ate) treats inflammatory conditions such as arthritis and psoriasis. It works by decreasing inflammation, which can reduce pain and prevent long-term injury to the joints and skin. It may also be used to treat some types of cancer. It works by slowing down the growth of cancer cells. This medicine may be used for other purposes; ask your health care provider or pharmacist if you have questions. COMMON BRAND NAME(S): Rheumatrex, Trexall What should I tell my care team before I take this medication? They need to know if you have any of these conditions: Fluid in the stomach area or lungs If you often drink alcohol Infection or immune system problems Kidney disease or on hemodialysis Liver disease Low blood counts, like low white cell, platelet, or red cell counts Lung disease Radiation therapy Stomach ulcers Ulcerative colitis An unusual or allergic reaction to methotrexate, other medications, foods, dyes, or preservatives Pregnant or trying to get pregnant Breast-feeding How should I use this medication? Take this medication by mouth with a glass of water. Follow the directions on the prescription label. Take your medication at regular intervals. Do not take it more often than directed. Do not stop taking except on your care team's advice. Make sure you know why you are taking this medication and how often you should take it. If this medication is used for a condition that is not cancer, like arthritis or psoriasis, it should be taken weekly, NOT daily. Taking this medication more often than directed can cause serious side effects, even death. Talk to your care team about safe handling and disposal of this medication. You may need to take special precautions. Talk to your care team about the use of this medication in children. While this medication may be prescribed for selected conditions, precautions do  apply. Overdosage: If you think you have taken too much of this medicine contact a poison control center or emergency room at once. NOTE: This medicine is only for you. Do not share this medicine with others. What if I miss a dose? If you miss a dose, talk with your care team. Do not take double or extra doses. What may interact with this medication? Do not take this medication with any of the following: Acitretin This medication may also interact with the following: Aspirin and aspirin-like medications including salicylates Azathioprine Certain antibiotics like penicillins, tetracycline, and chloramphenicol Certain medications that treat or prevent blood clots like warfarin, apixaban, dabigatran, and rivaroxaban Certain medications for stomach problems like esomeprazole, omeprazole, pantoprazole Cyclosporine Dapsone Diuretics Gold Hydroxychloroquine Live virus vaccines Medications for infection like acyclovir, adefovir, amphotericin B, bacitracin, cidofovir, foscarnet, ganciclovir, gentamicin, pentamidine, vancomycin Mercaptopurine NSAIDs, medications for pain and inflammation, like ibuprofen or naproxen Other cytotoxic agents Pamidronate Pemetrexed Penicillamine Phenylbutazone Phenytoin Probenecid Pyrimethamine Retinoids such as isotretinoin and tretinoin Steroid medications like prednisone or cortisone Sulfonamides like sulfasalazine and trimethoprim/sulfamethoxazole Theophylline Zoledronic acid This list may not describe all possible interactions. Give your health care provider a list of all the medicines, herbs, non-prescription drugs, or dietary supplements you use. Also tell them if you smoke, drink alcohol, or use illegal drugs. Some items may interact with your medicine. What should I watch for while using this medication? Avoid alcoholic drinks. This medication can make you more sensitive to the sun. Keep out of the sun. If you cannot avoid being in the sun, wear  protective clothing and use sunscreen. Do not use sun lamps or tanning beds/booths. You may need  blood work done while you are taking this medication. Call your care team for advice if you get a fever, chills or sore throat, or other symptoms of a cold or flu. Do not treat yourself. This medication decreases your body's ability to fight infections. Try to avoid being around people who are sick. This medication may increase your risk to bruise or bleed. Call your care team if you notice any unusual bleeding. Be careful brushing or flossing your teeth or using a toothpick because you may get an infection or bleed more easily. If you have any dental work done, tell your dentist you are receiving this medication. Check with your care team if you get an attack of severe diarrhea, nausea and vomiting, or if you sweat a lot. The loss of too much body fluid can make it dangerous for you to take this medication. Talk to your care team about your risk of cancer. You may be more at risk for certain types of cancers if you take this medication. Do not become pregnant while taking this medication or for 6 months after stopping it. Women should inform their care team if they wish to become pregnant or think they might be pregnant. Men should not father a child while taking this medication and for 3 months after stopping it. There is potential for serious harm to an unborn child. Talk to your care team for more information. Do not breast-feed an infant while taking this medication or for 1 week after stopping it. This medication may make it more difficult to get pregnant or father a child. Talk to your care team if you are concerned about your fertility. What side effects may I notice from receiving this medication? Side effects that you should report to your care team as soon as possible: Allergic reactions--skin rash, itching, hives, swelling of the face, lips, tongue, or throat Blood clot--pain, swelling, or warmth  in the leg, shortness of breath, chest pain Dry cough, shortness of breath or trouble breathing Infection--fever, chills, cough, sore throat, wounds that don't heal, pain or trouble when passing urine, general feeling of discomfort or being unwell Kidney injury--decrease in the amount of urine, swelling of the ankles, hands, or feet Liver injury--right upper belly pain, loss of appetite, nausea, light-colored stool, dark yellow or brown urine, yellowing of the skin or eyes, unusual weakness or fatigue Low red blood cell count--unusual weakness or fatigue, dizziness, headache, trouble breathing Redness, blistering, peeling, or loosening of the skin, including inside the mouth Seizures Unusual bruising or bleeding Side effects that usually do not require medical attention (report to your care team if they continue or are bothersome): Diarrhea Dizziness Hair loss Nausea Pain, redness, or swelling with sores inside the mouth or throat Vomiting This list may not describe all possible side effects. Call your doctor for medical advice about side effects. You may report side effects to FDA at 1-800-FDA-1088. Where should I keep my medication? Keep out of the reach of children and pets. Store at room temperature between 20 and 25 degrees C (68 and 77 degrees F). Protect from light. Get rid of any unused medication after the expiration date. Talk to your care team about how to dispose of unused medication. Special directions may apply. NOTE: This sheet is a summary. It may not cover all possible information. If you have questions about this medicine, talk to your doctor, pharmacist, or health care provider.  2022 Elsevier/Gold Standard (2020-12-02 00:00:00)  Standing Labs We placed an order  today for your standing lab work.   Please have your standing labs drawn in 2 weeks x 2 after starting methotrexate and then every 3 months  If possible, please have your labs drawn 2 weeks prior to your  appointment so that the provider can discuss your results at your appointment.  Please note that you may see your imaging and lab results in [place] before we have reviewed them. We may be awaiting multiple results to interpret others before contacting you. Please allow our office up to 72 hours to thoroughly review all of the results before contacting the office for clarification of your results.  We have open lab daily: Monday through Thursday from 1:30-4:30 PM and Friday from 1:30-4:00 PM at the office of Dr. Bo Merino, [facility].   Please be advised, all patients with office appointments requiring lab work will take precedent over walk-in lab work.  If possible, please come for your lab work on Monday and Friday afternoons, as you may experience shorter wait times. The office is located at [address] [place], [place], [place] [address] No appointment is necessary.   Labs are drawn by [facility]. Please bring your co-pay at the time of your lab draw.  You may receive a bill from [place] for your lab work.  Please note if you are on Hydroxychloroquine and and an order has been placed for a Hydroxychloroquine level, you will need to have it drawn 4 hours or more after your last dose.  If you wish to have your labs drawn at another location, please call the office 24 hours in advance to send orders.  Vaccines You are taking a medication(s) that can suppress your immune system.  The following immunizations are recommended: Flu annually Covid-19  Td/Tdap (tetanus, diphtheria, pertussis) every 10 years Pneumonia (Prevnar 15 then Pneumovax 23 at least 1 year apart.  Alternatively, can take Prevnar 20 without needing additional dose) Shingrix: 2 doses from 4 weeks to 6 months apart  Please check with your PCP to make sure you are up to date.   If you have signs or symptoms of an infection or start antibiotics: First, call your PCP for workup of your infection. Hold  your medication through the infection, until you complete your antibiotics, and until symptoms resolve if you take the following: Injectable medication (Actemra, Benlysta, Cimzia, Cosentyx, Enbrel, Humira, Kevzara, Orencia, Remicade, Simponi, Stelara, Taltz, Tremfya) Methotrexate Leflunomide (Arava) Mycophenolate (Cellcept) Morrie Sheldon, Olumiant, or Rinvoq   If you have any questions regarding directions or hours of operation,  please call [phone].   As a reminder, please drink plenty of water prior to coming for your lab work. Thanks!

## 2021-12-02 NOTE — Progress Notes (Signed)
Pharmacy Note  Subjective: Patient presents today to [facility] for follow up office visit. Patient seen by the pharmacist for counseling on methotrexate for  dermatomyositis . Prior therapy includes: methotrexate, hydroxychloroquine, and mycophenolate. Patient first started on MTX then hydroxychloroquine was added followed by mycophenolate. Patient states that initially combination of all three worked well but then her response plateaued. She came off of medications due to cost issues (loss of coverage).  Objective: CBC    Component Value Date/Time   WBC 7.2 11/06/2021 1047   RBC 3.36 (L) 11/06/2021 1047   HGB 11.1 (L) 11/06/2021 1047   HGB 13.0 08/20/2021 1558   HCT 32.3 (L) 11/06/2021 1047   HCT 37.4 08/20/2021 1558   PLT 259 11/06/2021 1047   PLT 290 08/20/2021 1558   MCV 96.1 11/06/2021 1047   MCV 93 08/20/2021 1558   MCH 33.0 11/06/2021 1047   MCHC 34.4 11/06/2021 1047   RDW 12.3 11/06/2021 1047   RDW 12.6 08/20/2021 1558   LYMPHSABS 2,160 11/06/2021 1047   MONOABS 1.0 12/28/2019 1642   EOSABS 878 (H) 11/06/2021 1047   BASOSABS 29 11/06/2021 1047    CMP     Component Value Date/Time   NA 137 11/06/2021 1047   NA 138 08/20/2021 1558   K 3.9 11/06/2021 1047   CL 103 11/06/2021 1047   CO2 26 11/06/2021 1047   GLUCOSE 95 11/06/2021 1047   BUN 10 11/06/2021 1047   BUN 12 08/20/2021 1558   CREATININE 0.46 (L) 11/06/2021 1047   CALCIUM 9.2 11/06/2021 1047   PROT 7.6 11/06/2021 1047   PROT 7.4 11/06/2021 1047   PROT 8.5 08/20/2021 1558   ALBUMIN 4.6 08/20/2021 1558   AST 36 (H) 11/06/2021 1047   ALT 21 11/06/2021 1047   ALKPHOS 65 08/20/2021 1558   BILITOT 0.5 11/06/2021 1047   BILITOT 0.4 08/20/2021 1558   GFRNONAA >60 12/28/2019 1642   GFRAA >60 12/28/2019 1642    Baseline Immunosuppressant Therapy Labs TB GOLD Quantiferon TB Gold Latest Ref Rng & Units 11/06/2021  Quantiferon TB Gold Plus NEGATIVE NEGATIVE   Hepatitis Panel Hepatitis Latest  Ref Rng & Units 11/06/2021  Hep B Surface Ag NON-REACTIVE NON-REACTIVE  Hep B IgM NON-REACTIVE NON-REACTIVE   HIV Lab Results  Component Value Date   HIV NON-REACTIVE 11/06/2021   Immunoglobulins Immunoglobulin Electrophoresis Latest Ref Rng & Units 11/06/2021  IgA  47 - 310 mg/dL 207  IgG 600 - 1,640 mg/dL 2,286(H)  IgM 50 - 300 mg/dL 42(L)   SPEP Serum Protein Electrophoresis Latest Ref Rng & Units 11/06/2021  Total Protein 6.1 - 8.1 g/dL 7.4  Albumin 3.8 - 4.8 g/dL 3.8  Alpha-1 0.2 - 0.3 g/dL 0.3  Alpha-2 0.5 - 0.9 g/dL 0.6  Beta Globulin 0.4 - 0.6 g/dL 0.4  Beta 2 0.2 - 0.5 g/dL 0.4  Gamma Globulin 0.8 - 1.7 g/dL 1.9(H)   Chest-xray:  11/29/21 - HRCT - There are some very mild fibrotic changes in the lungs, which are nonspecific in appearance, at this time categorized as 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 Discussed that there is the possibility of an increased risk of malignancy, specifically lymphomas, but it is not well understood if  this increased risk is due to the medication or the disease state.  Instructed patient that medication should be held for infection and prior to surgery.  Advised patient to avoid live vaccines. Recommend annual influenza, Pneumovax 23, Prevnar 13, and Shingrix as indicated.   Reviewed instructions with patient to take methotrexate weekly along with folic acid daily.  Discussed the importance of frequent monitoring of kidney and liver function and blood counts, and provided patient with standing lab instructions.  Counseled patient to avoid NSAIDs and alcohol while on methotrexate.  Provided patient with  educational materials on methotrexate and answered all questions.   Patient voiced understanding.  Patient consented to methotrexate use.  Will upload into chart.    Dose of methotrexate will be 15mg  weekly x 2 weeks, then 20mg  weekly thereafter along with folic acid 2mg  daily. Prescription sent today.  Knox Saliva, PharmD, MPH, BCPS Clinical Pharmacist (Rheumatology and Pulmonology)

## 2021-12-04 ENCOUNTER — Other Ambulatory Visit: Payer: Self-pay | Admitting: *Deleted

## 2021-12-04 ENCOUNTER — Other Ambulatory Visit: Payer: Self-pay | Admitting: Obstetrics and Gynecology

## 2021-12-04 DIAGNOSIS — Z1231 Encounter for screening mammogram for malignant neoplasm of breast: Secondary | ICD-10-CM

## 2021-12-05 ENCOUNTER — Telehealth: Payer: Self-pay

## 2021-12-05 ENCOUNTER — Encounter: Payer: Self-pay | Admitting: Nurse Practitioner

## 2021-12-05 ENCOUNTER — Telehealth: Payer: Self-pay | Admitting: Rheumatology

## 2021-12-05 NOTE — Telephone Encounter (Signed)
Pt is scheduled on 4/12 with PC.

## 2021-12-05 NOTE — Telephone Encounter (Signed)
[facility] rhem referring for Evaluation and vaginal ultrasound to r/o cancer. Patient also in need of a mammogram.. sch w PC. Called and left voicemail for patient to call back to be scheduled.

## 2021-12-05 NOTE — Telephone Encounter (Signed)
Patient called the office stating she was referred to an OB-GYN in [place] by Dr.  Estanislado Pandy. Patient states she doesn't want to go anywhere outside of [place] and would like a referral sent somewhere in [place].

## 2021-12-08 ENCOUNTER — Encounter: Payer: Self-pay | Admitting: Rheumatology

## 2021-12-08 ENCOUNTER — Encounter: Payer: Self-pay | Admitting: Nurse Practitioner

## 2021-12-08 NOTE — Telephone Encounter (Signed)
She needs an appt set and we need to get the labs from her Rheumatologist provider.

## 2021-12-09 ENCOUNTER — Encounter: Payer: Self-pay | Admitting: Internal Medicine

## 2021-12-09 ENCOUNTER — Encounter: Payer: Self-pay | Admitting: Nurse Practitioner

## 2021-12-11 ENCOUNTER — Other Ambulatory Visit: Payer: Self-pay

## 2021-12-11 ENCOUNTER — Ambulatory Visit (HOSPITAL_COMMUNITY)
Admission: RE | Admit: 2021-12-11 | Discharge: 2021-12-11 | Disposition: A | Discharge status: Home / Self Care | Payer: BC Managed Care – PPO | Source: Ambulatory Visit | Attending: Rheumatology | Admitting: Rheumatology

## 2021-12-11 DIAGNOSIS — N858 Other specified noninflammatory disorders of uterus: Secondary | ICD-10-CM | POA: Diagnosis not present

## 2021-12-11 DIAGNOSIS — N83202 Unspecified ovarian cyst, left side: Secondary | ICD-10-CM | POA: Diagnosis not present

## 2021-12-11 DIAGNOSIS — M3310 Other dermatopolymyositis, organ involvement unspecified: Secondary | ICD-10-CM | POA: Insufficient documentation

## 2021-12-11 MED ORDER — IOHEXOL 300 MG/ML  SOLN
100.0000 mL | Freq: Once | INTRAMUSCULAR | Status: AC | PRN
  Administered 2021-12-11: 100 mL via INTRAVENOUS

## 2021-12-11 MED ORDER — SODIUM CHLORIDE (PF) 0.9 % IJ SOLN
INTRAMUSCULAR | Status: AC
  Filled 2021-12-11: qty 50

## 2021-12-12 NOTE — Progress Notes (Signed)
Ovarian cyst are noted.  Which is read as benign by the radiologist.  Please advise patient to schedule an appointment with her GYN.  Forward results of the CT abdomen pelvis to her GYN due to increased risk of ovarian cancer

## 2021-12-15 ENCOUNTER — Telehealth: Payer: Self-pay | Admitting: Rheumatology

## 2021-12-15 ENCOUNTER — Encounter: Payer: Self-pay | Admitting: Nurse Practitioner

## 2021-12-15 ENCOUNTER — Other Ambulatory Visit: Payer: Self-pay

## 2021-12-15 ENCOUNTER — Telehealth: Payer: Self-pay

## 2021-12-15 DIAGNOSIS — M7989 Other specified soft tissue disorders: Secondary | ICD-10-CM

## 2021-12-15 DIAGNOSIS — Z91018 Allergy to other foods: Secondary | ICD-10-CM

## 2021-12-15 DIAGNOSIS — J4520 Mild intermittent asthma, uncomplicated: Secondary | ICD-10-CM

## 2021-12-15 MED ORDER — VITAMIN D (ERGOCALCIFEROL) 1.25 MG (50000 UNIT) PO CAPS: 50000.0000 [IU] | ORAL_CAPSULE | ORAL | 1 refills | Status: DC

## 2021-12-15 MED ORDER — HYDROXYZINE HCL 25 MG PO TABS: 25.0000 mg | ORAL_TABLET | Freq: Three times a day (TID) | ORAL | 1 refills | Status: DC | PRN

## 2021-12-15 MED ORDER — ALBUTEROL SULFATE HFA 108 (90 BASE) MCG/ACT IN AERS: 2.0000 | INHALATION_SPRAY | Freq: Four times a day (QID) | RESPIRATORY_TRACT | 2 refills | Status: AC | PRN

## 2021-12-15 MED ORDER — EPINEPHRINE 0.3 MG/0.3ML IJ SOAJ: 0.3000 mg | INTRAMUSCULAR | 0 refills | Status: DC | PRN

## 2021-12-15 NOTE — Telephone Encounter (Signed)
Patient left a voicemail stating she had missed a call from the office and wasn't expecting a call back today. Patient request a call back. ?

## 2021-12-15 NOTE — Telephone Encounter (Signed)
I called patient, patient will call the [facility] to schedule appt, patient will call [facility] to cancel appt. ?

## 2021-12-15 NOTE — Telephone Encounter (Signed)
Patient called stating she called the OB/GYN in [facility] and was told they don't have any appointments available until May.  Patient states her appointment with [facility] in [place] is in April.  Patient was not sure if she should cancel the appointment until she talks to Dr. Estanislado Pandy to find out how urgently she needs to be seen.  Patient states she would prefer to see someone in [place], but if Dr. Estanislado Pandy feels it is best to get her testing in April she will stay with [facility].  Patient requested a return call before 10:00 am or after 3:00 pm.   ?

## 2021-12-15 NOTE — Telephone Encounter (Signed)
Patient called the office requesting a call back regarding her OBGYN referral. Patient states she would like to know where the referral was sent in [place].  ?

## 2021-12-16 ENCOUNTER — Telehealth: Payer: Self-pay | Admitting: Rheumatology

## 2021-12-16 NOTE — Telephone Encounter (Signed)
Patient left a voicemail stating she made an appointment at the [facility] in [place] but they wont be able to see her until May. Patient states she is on a cancellation list. Patient states she looked online for other OBGYN's in [place] and found [place] [facility] has appointments available next month. Patient wants a new referral sent to the [place] center. Patient states neither GYN's in [place] do mammograms so she would like to know where she can get that done as well.  ?

## 2021-12-16 NOTE — Telephone Encounter (Signed)
I called patient, referral changed to [place] ?

## 2021-12-16 NOTE — Telephone Encounter (Signed)
I called patient, patient will call OBGYN to see if she can get a sooner appt than May, I advised the patient to ask to be placed on a cancellation list for a sooner appt, patient will call other OBGYN offices in [place] to see if she can be seen sooner than May, if patient finds another OBGYN office with a sooner appt, patient will call and we will resend referral. ?

## 2021-12-17 ENCOUNTER — Other Ambulatory Visit: Payer: Self-pay | Admitting: Obstetrics and Gynecology

## 2021-12-17 DIAGNOSIS — Z1231 Encounter for screening mammogram for malignant neoplasm of breast: Secondary | ICD-10-CM

## 2021-12-29 ENCOUNTER — Telehealth: Payer: Self-pay | Admitting: Rheumatology

## 2021-12-29 NOTE — Telephone Encounter (Signed)
Attempted to contact the patient. Left message to advise patient we will need labs prior to refilling prescription.  ?

## 2021-12-29 NOTE — Telephone Encounter (Signed)
Patient called the office requesting a refill of Methotrexate 2.'5mg'$ . Patient is due to take medication on Saturday. [facility] on [address] ?

## 2021-12-30 NOTE — Telephone Encounter (Signed)
Patient contacted the office an left message stating she has taken 2 doses of MTX 6 tabs weekly and then 2 doses of MTX 8 tabs weekly. ? ?Left message to advise patient she was due to have labs after 2 doses of MTX 6 tabs weekly and then again after 2 doses of MTX 8 tabs weekly. Advised patient she would need labs as soon as possible. Advise patient of labs hours.  ?

## 2022-01-01 ENCOUNTER — Other Ambulatory Visit: Payer: Self-pay | Admitting: *Deleted

## 2022-01-01 ENCOUNTER — Institutional Professional Consult (permissible substitution): Payer: BC Managed Care – PPO | Admitting: Student

## 2022-01-01 DIAGNOSIS — M3310 Other dermatopolymyositis, organ involvement unspecified: Secondary | ICD-10-CM

## 2022-01-01 DIAGNOSIS — Z79899 Other long term (current) drug therapy: Secondary | ICD-10-CM

## 2022-01-02 ENCOUNTER — Other Ambulatory Visit: Payer: Self-pay | Admitting: *Deleted

## 2022-01-02 LAB — COMPLETE METABOLIC PANEL WITH GFR
AG Ratio: 1.2 (calc) (ref 1.0–2.5)
ALT: 25 U/L (ref 6–29)
AST: 28 U/L (ref 10–30)
Albumin: 4.4 g/dL (ref 3.6–5.1)
Alkaline phosphatase (APISO): 45 U/L (ref 31–125)
BUN/Creatinine Ratio: 19 (calc) (ref 6–22)
BUN: 9 mg/dL (ref 7–25)
CO2: 30 mmol/L (ref 20–32)
Calcium: 9.5 mg/dL (ref 8.6–10.2)
Chloride: 102 mmol/L (ref 98–110)
Creat: 0.48 mg/dL — ABNORMAL LOW (ref 0.50–0.99)
Globulin: 3.8 g/dL (calc) — ABNORMAL HIGH (ref 1.9–3.7)
Glucose, Bld: 77 mg/dL (ref 65–99)
Potassium: 4.3 mmol/L (ref 3.5–5.3)
Sodium: 138 mmol/L (ref 135–146)
Total Bilirubin: 0.5 mg/dL (ref 0.2–1.2)
Total Protein: 8.2 g/dL — ABNORMAL HIGH (ref 6.1–8.1)
eGFR: 120 mL/min/{1.73_m2} (ref >=60)

## 2022-01-02 LAB — CBC WITH DIFFERENTIAL/PLATELET
Absolute Monocytes: 744 cells/uL (ref 200–950)
Basophils Absolute: 40 cells/uL (ref 0–200)
Basophils Relative: 0.6 %
Eosinophils Absolute: 362 cells/uL (ref 15–500)
Eosinophils Relative: 5.4 %
HCT: 35.8 % (ref 35.0–45.0)
Hemoglobin: 12.1 g/dL (ref 11.7–15.5)
Lymphs Abs: 2526 cells/uL (ref 850–3900)
MCH: 32.3 pg (ref 27.0–33.0)
MCHC: 33.8 g/dL (ref 32.0–36.0)
MCV: 95.5 fL (ref 80.0–100.0)
MPV: 10 fL (ref 7.5–12.5)
Monocytes Relative: 11.1 %
Neutro Abs: 3028 cells/uL (ref 1500–7800)
Neutrophils Relative %: 45.2 %
Platelets: 306 10*3/uL (ref 140–400)
RBC: 3.75 10*6/uL — ABNORMAL LOW (ref 3.80–5.10)
RDW: 12.5 % (ref 11.0–15.0)
Total Lymphocyte: 37.7 %
WBC: 6.7 10*3/uL (ref 3.8–10.8)

## 2022-01-02 LAB — CK: Total CK: 384 U/L — ABNORMAL HIGH (ref 29–143)

## 2022-01-02 MED ORDER — METHOTREXATE 2.5 MG PO TABS: 20.0000 mg | ORAL_TABLET | ORAL | 0 refills | Status: DC

## 2022-01-02 NOTE — Progress Notes (Signed)
CK is better at 384.  CMP is normal.  CBC is normal.

## 2022-01-02 NOTE — Telephone Encounter (Signed)
-----   Message from Bo Merino, MD sent at 01/02/2022  8:41 AM EDT ----- ?CK is better at 384.  CMP is normal.  CBC is normal. ?

## 2022-01-02 NOTE — Telephone Encounter (Signed)
Patient states she needs refill on MTX. Patient states she is taking 8 tabs weekly.  ?

## 2022-01-06 NOTE — Progress Notes (Signed)
? ?Office Visit Note ? ?Patient: Patricia Krause             ?Date of Birth: 02-20-1976           ?MRN: [id number]             ?PCP: [name], FNP ?Referring: [name], FNP ?Visit Date: 01/20/2022 ?Occupation: '@GUAROCC'$ @ ? ?Subjective:  ?Medication monitoring  ? ?History of Present Illness: Patricia Krause is a 46 y.o. female with history of dermatomyositis and scleroderma.  Patient was restarted on methotrexate after her last office visit on 12/02/2021.  She is currently taking methotrexate 8 tablets by mouth once weekly and folic acid 2 mg daily.  She has been tolerating methotrexate without any side effects and has not missed any doses recently.  She denies any recent infections since restarting methotrexate.  She denies any signs or symptoms of a dermatomyositis flare since her last office visit.  Her symptoms of Raynaud's have been less frequent with warmer weather temperatures.  She continues to have lymphedema in both lower extremities and will be establishing care to lymphedema clinic.  She also has an upcoming appointment to establish care with a gynecologist as well as a pulmonologist as advised.  She has not had any increased shortness of breath or any new or worsening pulmonary symptoms since her last office visit.  She denies any increased joint pain or joint swelling.  She denies any other new concerns at this time.  ? ?Activities of Daily Living:  ?Patient reports morning stiffness for 0  none .   ?Patient Denies nocturnal pain.  ?Difficulty dressing/grooming: Reports ?Difficulty climbing stairs: Reports ?Difficulty getting out of chair: Denies ?Difficulty using hands for taps, buttons, cutlery, and/or writing: Reports ? ?Review of Systems  ?Constitutional:  Positive for fatigue.  ?HENT:  Positive for mouth dryness.   ?Eyes:  Positive for dryness.  ?Respiratory:  Negative for shortness of breath.   ?Cardiovascular:  Positive for swelling in legs/feet.  ?Gastrointestinal:  Positive for  constipation and diarrhea.  ?Endocrine: Positive for cold intolerance, heat intolerance and increased urination.  ?Genitourinary:  Negative for difficulty urinating.  ?Musculoskeletal:  Positive for gait problem and muscle weakness.  ?Skin:  Positive for rash.  ?Allergic/Immunologic: Negative for susceptible to infections.  ?Neurological:  Positive for weakness.  ?Hematological:  Negative for bruising/bleeding tendency.  ?Psychiatric/Behavioral:  Negative for sleep disturbance.   ? ?PMFS History:  ?Patient Active Problem List  ? Diagnosis Date Noted  ? Adult onset dermatomyositis ([place]) 12/02/2021  ? Raynaud's syndrome without gangrene 12/02/2021  ? History of asthma 11/06/2021  ? Vitamin D deficiency 11/06/2021  ? Prediabetes 11/06/2021  ? Essential hypertension 11/06/2021  ? Hypokalemia 08/20/2021  ?  ?Past Medical History:  ?Diagnosis Date  ? Asthma   ? Dermatolysis   ? Hypertension   ? Lymphedema   ? dx by vascular doctor  ? Pre-diabetes   ?  ?Family History  ?Problem Relation Age of Onset  ? Diabetes Father   ? Cancer Father   ? High Cholesterol Brother   ? Diabetes Paternal Grandmother   ? Alzheimer's disease Paternal Grandfather   ? Diabetes Son   ? Autism Son   ? ?Past Surgical History:  ?Procedure Laterality Date  ? BREAST SURGERY    ? WISDOM TOOTH EXTRACTION    ? ?Social History  ? ?Social History Narrative  ? Not on file  ? ?Immunization History  ?Administered Date(s) Administered  ? Influenza,inj,Quad PF,6+ Mos 07/23/2021  ? PFIZER(Purple  Top)SARS-COV-2 Vaccination 12/29/2019, 01/23/2020, 08/02/2020  ? Pension scheme manager 75yr & up 07/05/2021  ? Pneumococcal Polysaccharide-23 08/20/2021  ? Tdap 08/20/2021  ?  ? ?Objective: ?Vital Signs: BP 125/72 (BP Location: Left Arm, Patient Position: Sitting, Cuff Size: Normal)   Pulse 85   Resp 16   Ht '5\' 8"'$  (1.727 m)   Wt 208 lb (94.3 kg)   BMI 31.63 kg/m?   ? ?Physical Exam ?Vitals and nursing note reviewed.  ?Constitutional:   ?    Appearance: She is well-developed.  ?HENT:  ?   Head: Normocephalic and atraumatic.  ?Eyes:  ?   Conjunctiva/sclera: Conjunctivae normal.  ?Cardiovascular:  ?   Rate and Rhythm: Normal rate and regular rhythm.  ?   Heart sounds: Normal heart sounds.  ?Pulmonary:  ?   Effort: Pulmonary effort is normal.  ?   Breath sounds: Normal breath sounds.  ?Abdominal:  ?   General: Bowel sounds are normal.  ?   Palpations: Abdomen is soft.  ?Musculoskeletal:  ?   Cervical back: Normal range of motion.  ?   Comments: Bilateral lower extremity lymphedema noted.   ?Skin: ?   General: Skin is warm and dry.  ?   Capillary Refill: Capillary refill takes less than 2 seconds.  ?   Comments: Calcinosis, poikiloderma, sclerodactyly, nailbed capillary changes and loss of digital tufts was noted on examination today.  ?No digital ulcerations.   ?Neurological:  ?   Mental Status: She is alert and oriented to person, place, and time.  ?Psychiatric:     ?   Behavior: Behavior normal.  ?  ? ?Musculoskeletal Exam: C-spine has good range of motion with no discomfort.  Postural thoracic kyphosis noted.  No midline spinal tenderness or SI joint tenderness.  Limited range of motion of both shoulder joints to about 120 degrees bilaterally.  Bilateral elbow joint contractures noted.  Wrist joints have good range of motion with no tenderness or synovitis.  Incomplete fist formation due to contractures in PIP and DIP joints. Sclerodactyly noted.  Hip joints have slightly limited ROM.  Knee joints have good ROM with no warmth or effusion.  Bilateral lymphedema noted.  ? ?CDAI Exam: ?CDAI Score: -- ?Patient Global: --; Provider Global: -- ?Swollen: --; Tender: -- ?Joint Exam 01/20/2022  ? ?No joint exam has been documented for this visit  ? ?There is currently no information documented on the homunculus. Go to the Rheumatology activity and complete the homunculus joint exam. ? ?Investigation: ?No additional findings. ? ?Imaging: ?No results  found. ? ?Recent Labs: ?Lab Results  ?Component Value Date  ? WBC 6.7 01/01/2022  ? HGB 12.1 01/01/2022  ? PLT 306 01/01/2022  ? NA 138 01/01/2022  ? K 4.3 01/01/2022  ? CL 102 01/01/2022  ? CO2 30 01/01/2022  ? GLUCOSE 77 01/01/2022  ? BUN 9 01/01/2022  ? CREATININE 0.48 (L) 01/01/2022  ? BILITOT 0.5 01/01/2022  ? ALKPHOS 65 08/20/2021  ? AST 28 01/01/2022  ? ALT 25 01/01/2022  ? PROT 8.2 (H) 01/01/2022  ? ALBUMIN 4.6 08/20/2021  ? CALCIUM 9.5 01/01/2022  ? GFRAA >60 12/28/2019  ? QFTBGOLDPLUS NEGATIVE 11/06/2021  ? ? ?Speciality Comments: Dx in 2005-presented postpartum '04. Left bicep muscle bx 2005 + skin bx+. IVIG in the past.Ttd with MTX, PLQ and MMF. Off all meds since 2018 ? ?Procedures:  ?No procedures performed ?Allergies: Cat hair extract, Mixed ragweed, and Pollinex-t [modified tree tyrosine adsorbate]  ? ?Assessment / Plan:     ?  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 Left bicep muscle bx 2005 + skin bx+. IVIG, muscle wasting, poikiloderma, sclerodactyly, calcinosis, capillary changes:  ?She is not exhibiting any signs or symptoms of a flare at this time.  Myositis panel was negative on 11/06/2021.  Her CK was 384 on 12/04/2021 (trended down from 766 on 11/06/21).  Overall her symptoms have been stable.  She is currently taking methotrexate 8 tablets by mouth once weekly and folic acid 2 mg daily was restarted after her last office visit on 12/02/2021.  She has been tolerating methotrexate without any side effects and has not had any recent infections.  LFTs were within normal limits on 01/01/2022.  She does not require more aggressive immunosuppressive therapy at this time.  She was advised to notify us if she develops any new or worsening symptoms. ?The patient has an upcoming appointment with gynecology and pulmonology to establish care as recommended. ?CT abdomen and pelvis performed on 12/11/2021: No acute findings in abdomen or pelvis noted.  She has a  3.6 cm left ovarian cyst and no follow-up imaging was recommended.   ?She had a high-resolution chest CT on 11/27/2021 which revealed very mild fibrotic changes.  She is not experiencing any new or worsening pulmonary symptoms and her lun

## 2022-01-13 ENCOUNTER — Ambulatory Visit: Payer: BC Managed Care – PPO | Admitting: Physician Assistant

## 2022-01-14 ENCOUNTER — Other Ambulatory Visit: Payer: BC Managed Care – PPO

## 2022-01-20 ENCOUNTER — Encounter: Payer: Self-pay | Admitting: Physician Assistant

## 2022-01-20 ENCOUNTER — Ambulatory Visit: Payer: BC Managed Care – PPO | Admitting: Physician Assistant

## 2022-01-20 VITALS — BP 125/72 | HR 85 | Resp 16 | Ht 68.0 in | Wt 208.0 lb

## 2022-01-20 DIAGNOSIS — M3310 Other dermatopolymyositis, organ involvement unspecified: Secondary | ICD-10-CM | POA: Diagnosis not present

## 2022-01-20 DIAGNOSIS — R0602 Shortness of breath: Secondary | ICD-10-CM

## 2022-01-20 DIAGNOSIS — I10 Essential (primary) hypertension: Secondary | ICD-10-CM

## 2022-01-20 DIAGNOSIS — M79642 Pain in left hand: Secondary | ICD-10-CM

## 2022-01-20 DIAGNOSIS — Z8709 Personal history of other diseases of the respiratory system: Secondary | ICD-10-CM

## 2022-01-20 DIAGNOSIS — Z79899 Other long term (current) drug therapy: Secondary | ICD-10-CM | POA: Diagnosis not present

## 2022-01-20 DIAGNOSIS — R7303 Prediabetes: Secondary | ICD-10-CM

## 2022-01-20 DIAGNOSIS — M25474 Effusion, right foot: Secondary | ICD-10-CM

## 2022-01-20 DIAGNOSIS — R5383 Other fatigue: Secondary | ICD-10-CM

## 2022-01-20 DIAGNOSIS — M79641 Pain in right hand: Secondary | ICD-10-CM

## 2022-01-20 DIAGNOSIS — R600 Localized edema: Secondary | ICD-10-CM

## 2022-01-20 DIAGNOSIS — M349 Systemic sclerosis, unspecified: Secondary | ICD-10-CM | POA: Diagnosis not present

## 2022-01-20 DIAGNOSIS — I7300 Raynaud's syndrome without gangrene: Secondary | ICD-10-CM

## 2022-01-20 DIAGNOSIS — E559 Vitamin D deficiency, unspecified: Secondary | ICD-10-CM

## 2022-01-20 NOTE — Patient Instructions (Signed)
Standing Labs ?We placed an order today for your standing lab work.  ? ?Please have your standing labs drawn at end of June and then every 3 months  ? ?If possible, please have your labs drawn 2 weeks prior to your appointment so that the provider can discuss your results at your appointment. ? ?Please note that you may see your imaging and lab results in [facility] before we have reviewed them. ?We may be awaiting multiple results to interpret others before contacting you. ?Please allow our office up to 72 hours to thoroughly review all of the results before contacting the office for clarification of your results. ? ?We have open lab daily: ?Monday through Thursday from 1:30-4:30 PM and Friday from 1:30-4:00 PM ?at the office of Dr. Bo Merino, [facility].   ?Please be advised, all patients with office appointments requiring lab work will take precedent over walk-in lab work.  ?If possible, please come for your lab work on Monday and Friday afternoons, as you may experience shorter wait times. ?The office is located at [address] [place], [place], [place] [address] ?No appointment is necessary.   ?Labs are drawn by [facility]. Please bring your co-pay at the time of your lab draw.  You may receive a bill from [facility] for your lab work. ? ?Please note if you are on Hydroxychloroquine and and an order has been placed for a Hydroxychloroquine level, you will need to have it drawn 4 hours or more after your last dose. ? ?If you wish to have your labs drawn at another location, please call the office 24 hours in advance to send orders. ? ?If you have any questions regarding directions or hours of operation,  ?please call [phone].   ?As a reminder, please drink plenty of water prior to coming for your lab work. Thanks! ? ?

## 2022-01-21 ENCOUNTER — Encounter: Payer: BC Managed Care – PPO | Admitting: Obstetrics and Gynecology

## 2022-01-21 ENCOUNTER — Other Ambulatory Visit: Payer: BC Managed Care – PPO

## 2022-01-21 DIAGNOSIS — E039 Hypothyroidism, unspecified: Secondary | ICD-10-CM

## 2022-01-22 LAB — TSH: TSH: 3.71 u[IU]/mL (ref 0.450–4.500)

## 2022-01-26 ENCOUNTER — Encounter: Payer: Self-pay | Admitting: Obstetrics and Gynecology

## 2022-01-26 ENCOUNTER — Other Ambulatory Visit: Payer: Self-pay | Admitting: Nurse Practitioner

## 2022-01-26 ENCOUNTER — Other Ambulatory Visit: Payer: Self-pay | Admitting: Obstetrics and Gynecology

## 2022-01-26 ENCOUNTER — Ambulatory Visit: Payer: BC Managed Care – PPO | Admitting: Obstetrics and Gynecology

## 2022-01-26 VITALS — BP 122/68 | HR 82 | Ht 68.5 in | Wt 206.0 lb

## 2022-01-26 DIAGNOSIS — Z1231 Encounter for screening mammogram for malignant neoplasm of breast: Secondary | ICD-10-CM

## 2022-01-26 DIAGNOSIS — D219 Benign neoplasm of connective and other soft tissue, unspecified: Secondary | ICD-10-CM

## 2022-01-26 DIAGNOSIS — N83202 Unspecified ovarian cyst, left side: Secondary | ICD-10-CM

## 2022-01-26 NOTE — Progress Notes (Signed)
GYNECOLOGY  VISIT ?  ?HPI: ?46 y.o.   Married  Mixed race  female   ?G1P1 with No LMP recorded. (Menstrual status: IUD).   ?here for to evaluated for abnormal CT scan.  ?Here to rule out ovarian cancer related to her dermatomyositis.  ?Dr. Nelson Chimes is her rheumatologist.  ? ?She denies LLQ pain.  ?No bleeding with Mirena IUD.  ? ?Narrative & Impression  ?CLINICAL DATA:  Dermatomyositis.  Cancer screening. ?  ?EXAM: ?CT ABDOMEN AND PELVIS WITH CONTRAST ?  ?TECHNIQUE: ?Multidetector CT imaging of the abdomen and pelvis was performed ?using the standard protocol following bolus administration of ?intravenous contrast. ?  ?RADIATION DOSE REDUCTION: This exam was performed according to the ?departmental dose-optimization program which includes automated ?exposure control, adjustment of the mA and/or kV according to ?patient size and/or use of iterative reconstruction technique. ?  ?CONTRAST:  129m OMNIPAQUE IOHEXOL 300 MG/ML  SOLN ?  ?COMPARISON:  None. ?  ?FINDINGS: ?Lower chest: Linear bibasilar atelectasis.  No effusions. ?  ?Hepatobiliary: No focal hepatic abnormality. Gallbladder ?unremarkable. ?  ?Pancreas: No focal abnormality or ductal dilatation. ?  ?Spleen: No focal abnormality.  Normal size. ?  ?Adrenals/Urinary Tract: No adrenal abnormality. No focal renal ?abnormality. No stones or hydronephrosis. Urinary bladder is ?unremarkable. ?  ?Stomach/Bowel: Normal appendix. Stomach, large and small bowel ?grossly unremarkable. ?  ?Vascular/Lymphatic: No evidence of aneurysm or adenopathy. ?  ?Reproductive: Left ovarian cyst measuring 3.6 cm. IUD noted in the ?uterus. Small low-density areas within the uterus may reflect small ?fibroids. No right adnexal mass. ?  ?Other: No free fluid or free air. ?  ?Musculoskeletal: No acute bony abnormality. ?  ?IMPRESSION: ?No acute findings in the abdomen or pelvis. ?  ?3.6 cm left ovarian cyst. No follow-up imaging recommended. Note: ?This recommendation does not apply to  premenarchal patients and to ?those with increased risk (genetic, family history, elevated tumor ?markers or other high-risk factors) of ovarian cancer. Reference: ?JACR 2020 Feb; 17(2):248-254 ?  ?Low-density areas in the uterus, likely fibroids. ?  ?  ?Electronically Signed ?  By: KRolm BaptiseM.D. ?  On: 12/12/2021 00:24 ?   ? ? ? ? ?GYNECOLOGIC HISTORY: ?No LMP recorded. (Menstrual status: IUD). ?Contraception:  Mirena IUD 2019.   Female partner.  ?Menopausal hormone therapy:  none ?Last mammogram:  5[place]6 years ago [place] in [place]?Last pap smear: 5-6 years ago also normal per patient.  She will do with her PCP at the end of this month.  ?       ?OB History   ? ? Gravida  ?1  ? Para  ?1  ? Term  ?   ? Preterm  ?   ? AB  ?   ? Living  ?1  ?  ? ? SAB  ?   ? IAB  ?   ? Ectopic  ?   ? Multiple  ?   ? Live Births  ?   ?   ?  ?  ?    ? ?Patient Active Problem List  ? Diagnosis Date Noted  ? Adult onset dermatomyositis ([facility] 12/02/2021  ? Raynaud's syndrome without gangrene 12/02/2021  ? History of asthma 11/06/2021  ? Vitamin D deficiency 11/06/2021  ? Prediabetes 11/06/2021  ? Essential hypertension 11/06/2021  ? Hypokalemia 08/20/2021  ? ? ?Past Medical History:  ?Diagnosis Date  ? Asthma   ? Dermatolysis   ? Hypertension   ? Lymphedema   ? dx by vascular  doctor  ? Pre-diabetes   ? ? ?Past Surgical History:  ?Procedure Laterality Date  ? BREAST SURGERY    ? WISDOM TOOTH EXTRACTION    ? ? ?Current Outpatient Medications  ?Medication Sig Dispense Refill  ? albuterol (VENTOLIN HFA) 108 (90 Base) MCG/ACT inhaler Inhale 2 puffs into the lungs every 6 (six) hours as needed for wheezing or shortness of breath. 8 g 2  ? amLODipine (NORVASC) 5 MG tablet Take 1 tablet (5 mg total) by mouth daily. 90 tablet 1  ? fluticasone (FLONASE) 50 MCG/ACT nasal spray Place 2 sprays into both nostrils daily. 18.2 mL 2  ? folic acid (FOLVITE) 1 MG tablet Take 2 tablets (2 mg total) by mouth daily. 180 tablet 1  ? hydrOXYzine  (ATARAX) 25 MG tablet Take 1 tablet (25 mg total) by mouth 3 (three) times daily as needed. 270 tablet 1  ? levonorgestrel (MIRENA) 20 MCG/DAY IUD by Intrauterine route.    ? methotrexate (RHEUMATREX) 2.5 MG tablet Take 8 tablets (20 mg total) by mouth once a week. Caution:Chemotherapy. Protect from light. 96 tablet 0  ? mupirocin ointment (BACTROBAN) 2 % Apply 1 application topically 2 (two) times daily. 30 g 3  ? RYBELSUS 7 MG TABS TAKE 1 TABLET BY MOUTH ONCE DAILY 90 tablet 0  ? Vitamin D, Ergocalciferol, (DRISDOL) 1.25 MG (50000 UNIT) CAPS capsule Take 1 capsule (50,000 Units total) by mouth every 7 (seven) days. 24 capsule 1  ? ?No current facility-administered medications for this visit.  ?  ? ?ALLERGIES: Cat hair extract, Mixed ragweed, and Pollinex-t [modified tree tyrosine adsorbate] ? ?Family History  ?Problem Relation Age of Onset  ? Diabetes Father   ? Cancer Father   ?     prostate cancer in her 23s.  ? High Cholesterol Brother   ? Cancer Paternal Aunt   ?     uncertain what type of cancer  ? Diabetes Paternal Grandmother   ? Alzheimer's disease Paternal Grandfather   ? Diabetes Son   ? Autism Son   ? ? ?Social History  ? ?Socioeconomic History  ? Marital status: Married  ?  Spouse name: Not on file  ? Number of children: Not on file  ? Years of education: Not on file  ? Highest education level: Not on file  ?Occupational History  ? Not on file  ?Tobacco Use  ? Smoking status: Never  ? Smokeless tobacco: Never  ?Vaping Use  ? Vaping Use: Never used  ?Substance and Sexual Activity  ? Alcohol use: Yes  ?  Comment: 1-2 drinks/months  ? Drug use: Never  ? Sexual activity: Yes  ?  Birth control/protection: I.U.D.  ?  Comment: Mirena 2020  ?Other Topics Concern  ? Not on file  ?Social History Narrative  ? Not on file  ? ?Social Determinants of Health  ? ?Financial Resource Strain: Not on file  ?Food Insecurity: Not on file  ?Transportation Needs: Not on file  ?Physical Activity: Not on file  ?Stress: Not on  file  ?Social Connections: Not on file  ?Intimate Partner Violence: Not on file  ? ? ?Review of Systems  ?All other systems reviewed and are negative. ? ?PHYSICAL EXAMINATION:   ? ?BP 122/68   Pulse 82   Ht 5' 8.5" (1.74 m)   Wt 206 lb (93.4 kg)   SpO2 98%   BMI 30.87 kg/m?     ?General appearance: alert, cooperative and appears stated age ?Head: Normocephalic, without obvious abnormality,  atraumatic ?Neck: no adenopathy, supple, symmetrical, trachea midline and thyroid normal to inspection and palpation ?Lungs: clear to auscultation bilaterally ?Heart: regular rate and rhythm ?Abdomen: umbilical hernia without mass.  Abdomen is soft, non-tender, no masses,  no organomegaly ?Extremities: Edema of bilateral LEs. ?Skin: areas of hypopigmentation and rash noted.  ?No abnormal inguinal nodes palpated ?Neurologic: Grossly normal ? ?Pelvic: External genitalia:  no lesions ?             Urethra:  normal appearing urethra with no masses, tenderness or lesions ?             Bartholins and Skenes: normal    ?             Vagina: normal appearing vagina with normal color and discharge, no lesions ?             Cervix: no lesions.  IUD strings noted.  ?               ?Bimanual Exam:  Uterus:  normal size, contour, position, consistency, mobility, non-tender ?             Adnexa: no mass, fullness, tenderness ?             Rectal exam: yes.  Confirms. ?             Anus:  normal sphincter tone, no lesions ? ?Chaperone was present for exam:  Maudie Mercury , CMA ? ?ASSESSMENT ? ?Dermatomyositis. ?Left ovarian cyst.  ?Fibroids.  ? ?PLAN ? ?CT scan result reviewed.  ?Ovarian cysts and fibroids discussed.  ?Relationship of dermatomyositis with ovarian cancer acknowledged.  ?Patient will return for pelvic ultrasound and recheck.  ?She has an appointment for her pap with her PCP.  ?I gave her the contact information for a mammogram at the [facility].  She was informed that she can self refer.  ?  ?An After Visit Summary was printed and  given to the patient. ? ?37 min  total time was spent for this patient encounter, including preparation, face-to-face counseling with the patient, coordination of care, and documentation of the encounter. ? ? ?

## 2022-01-28 ENCOUNTER — Ambulatory Visit: Payer: Medicaid Other | Admitting: Dermatology

## 2022-01-28 ENCOUNTER — Other Ambulatory Visit: Payer: Self-pay | Admitting: Rheumatology

## 2022-01-28 MED ORDER — METHOTREXATE 2.5 MG PO TABS: 20.0000 mg | ORAL_TABLET | ORAL | 2 refills | Status: DC

## 2022-01-28 NOTE — Telephone Encounter (Signed)
Patient called the office requesting a refill of Methotrexate 2.'5mg'$  to be sent to [facility] on [address]. ?

## 2022-01-28 NOTE — Telephone Encounter (Signed)
Next Visit: 04/22/2022 ? ?Last Visit: 01/20/2022 ? ?Last Fill: 01/02/2022 ? ?DX: Adult onset dermatomyositis  ? ?Current Dose per office note 01/20/2022: methotrexate 8 tablets by mouth once weekly  ? ?Labs: 01/01/2022 CK is better at 384.  CMP is normal.  CBC is normal. ? ?Okay to refill MTX?  ?

## 2022-01-30 ENCOUNTER — Ambulatory Visit: Payer: BC Managed Care – PPO | Admitting: Student

## 2022-01-30 ENCOUNTER — Encounter: Payer: Self-pay | Admitting: Student

## 2022-01-30 VITALS — BP 106/74 | HR 81 | Temp 98.3°F | Ht 68.5 in | Wt 209.4 lb

## 2022-01-30 DIAGNOSIS — J4520 Mild intermittent asthma, uncomplicated: Secondary | ICD-10-CM | POA: Diagnosis not present

## 2022-01-30 DIAGNOSIS — R0989 Other specified symptoms and signs involving the circulatory and respiratory systems: Secondary | ICD-10-CM | POA: Diagnosis not present

## 2022-01-30 DIAGNOSIS — R0609 Other forms of dyspnea: Secondary | ICD-10-CM

## 2022-01-30 NOTE — Patient Instructions (Signed)
-   You'll be called to schedule breathing tests (PFTs) on same day as next clinic visit in 6 weeks ? ?

## 2022-01-30 NOTE — Progress Notes (Signed)
? ?Synopsis: Referred for ILD by Bo Merino, MD ? ?Subjective:  ? ?PATIENT ID: Patricia Krause GENDER: female DOB: 11-22-1975, MRN: [id number] ? ?Chief Complaint  ?Patient presents with  ? Pulmonary Consult  ?  Referred by Dr. Estanislado Pandy. Pt c/o DOE since wt gain over the past 2 years. She gets winded if she walks fast. She has some difficulty with swallowing at times.   ? ?62yF with dermatomyositis (bx proven - dx 2005) and scleroderma (negative Ab panel) followed by Dr. Estanislado Pandy on MTX (has been on it for a couple months here but was on it primarily from 2005-2018), lymphedema, mild asthma on albuterol. Had covid-19 in August of last year - symptomatically pretty severe but not hospitalized.  ? ?She has DOE only with strenuous exertion. Been an issue over the last few years. No cough. No orthopnea.  ?No snoring. No PND. Occasionally sleepy during the day but attributes this to insufficient sleep, prefers to stay up late. Has to be careful when she swallows - can run into choking when eating/drinking.  ? ?Previous immunosuppression includes: MMF, plaquenil, and IVIG. No issues with these medications. ? ?Otherwise pertinent review of systems is negative. ? ?Cousin with autoimmune disease but she is unsure what it is. No family history of lung cancer.  ? ?She works from home, Writer.  ? ? ? ?Past Medical History:  ?Diagnosis Date  ? Asthma   ? Dermatolysis   ? Hypertension   ? Lymphedema   ? dx by vascular doctor  ? Pre-diabetes   ?  ? ?Family History  ?Problem Relation Age of Onset  ? Diabetes Father   ? Cancer Father   ?     prostate cancer in her 26s.  ? High Cholesterol Brother   ? Cancer Paternal Aunt   ?     uncertain what type of cancer  ? Diabetes Paternal Grandmother   ? Alzheimer's disease Paternal Grandfather   ? Diabetes Son   ? Autism Son   ?  ? ?Past Surgical History:  ?Procedure Laterality Date  ? BREAST SURGERY    ? WISDOM TOOTH EXTRACTION    ? ? ?Social History  ? ?Socioeconomic History   ? Marital status: Married  ?  Spouse name: Not on file  ? Number of children: Not on file  ? Years of education: Not on file  ? Highest education level: Not on file  ?Occupational History  ? Not on file  ?Tobacco Use  ? Smoking status: Never  ? Smokeless tobacco: Never  ?Vaping Use  ? Vaping Use: Never used  ?Substance and Sexual Activity  ? Alcohol use: Yes  ?  Comment: 1-2 drinks/months  ? Drug use: Never  ? Sexual activity: Yes  ?  Birth control/protection: I.U.D.  ?  Comment: Mirena 2020  ?Other Topics Concern  ? Not on file  ?Social History Narrative  ? Not on file  ? ?Social Determinants of Health  ? ?Financial Resource Strain: Not on file  ?Food Insecurity: Not on file  ?Transportation Needs: Not on file  ?Physical Activity: Not on file  ?Stress: Not on file  ?Social Connections: Not on file  ?Intimate Partner Violence: Not on file  ?  ? ?Allergies  ?Allergen Reactions  ? Cat Hair Extract Hives and Itching  ? Mixed Ragweed Other (See Comments)  ? Pollinex-T [Modified Tree Tyrosine Adsorbate] Other (See Comments)  ?  ? ?Outpatient Medications Prior to Visit  ?Medication Sig Dispense Refill  ? albuterol (  VENTOLIN HFA) 108 (90 Base) MCG/ACT inhaler Inhale 2 puffs into the lungs every 6 (six) hours as needed for wheezing or shortness of breath. 8 g 2  ? amLODipine (NORVASC) 5 MG tablet Take 1 tablet (5 mg total) by mouth daily. 90 tablet 1  ? fluticasone (FLONASE) 50 MCG/ACT nasal spray Place 2 sprays into both nostrils daily. 18.2 mL 2  ? folic acid (FOLVITE) 1 MG tablet Take 2 tablets (2 mg total) by mouth daily. 180 tablet 1  ? hydrOXYzine (ATARAX) 25 MG tablet Take 1 tablet (25 mg total) by mouth 3 (three) times daily as needed. 270 tablet 1  ? levonorgestrel (MIRENA) 20 MCG/DAY IUD by Intrauterine route.    ? methotrexate (RHEUMATREX) 2.5 MG tablet Take 8 tablets (20 mg total) by mouth once a week. Caution:Chemotherapy. Protect from light. 32 tablet 2  ? mupirocin ointment (BACTROBAN) 2 % Apply 1  application topically 2 (two) times daily. 30 g 3  ? RYBELSUS 7 MG TABS TAKE 1 TABLET BY MOUTH ONCE DAILY 90 tablet 0  ? Vitamin D, Ergocalciferol, (DRISDOL) 1.25 MG (50000 UNIT) CAPS capsule Take 1 capsule (50,000 Units total) by mouth every 7 (seven) days. 24 capsule 1  ? ?No facility-administered medications prior to visit.  ? ? ? ? ? ?Objective:  ? ?Physical Exam: ? ?General appearance: 46 y.o., female, NAD, conversant  ?Eyes: anicteric sclerae; PERRL, tracking appropriately ?HENT: NCAT; MMM ?Neck: Trachea midline; no lymphadenopathy, no JVD ?Lungs: CTAB, no crackles, no wheeze, with normal respiratory effort ?CV: RRR, no murmur  ?Abdomen: Soft, non-tender; non-distended, BS present  ?Extremities: BLE lymphedema, warm ?Neuro: Alert and oriented to person and place, no focal deficit  ? ? ? ?Vitals:  ? 01/30/22 1555  ?BP: 106/74  ?Pulse: 81  ?Temp: 98.3 ?F (36.8 ?C)  ?TempSrc: Oral  ?SpO2: 98%  ?Weight: 209 lb 6.4 oz (95 kg)  ?Height: 5' 8.5" (1.74 m)  ? ?98% on RA ?BMI Readings from Last 3 Encounters:  ?01/30/22 31.38 kg/m?  ?01/26/22 30.87 kg/m?  ?01/20/22 31.63 kg/m?  ? ?Wt Readings from Last 3 Encounters:  ?01/30/22 209 lb 6.4 oz (95 kg)  ?01/26/22 206 lb (93.4 kg)  ?01/20/22 208 lb (94.3 kg)  ? ? ? ?CBC ?   ?Component Value Date/Time  ? WBC 6.7 01/01/2022 1536  ? RBC 3.75 (L) 01/01/2022 1536  ? HGB 12.1 01/01/2022 1536  ? HGB 13.0 08/20/2021 1558  ? HCT 35.8 01/01/2022 1536  ? HCT 37.4 08/20/2021 1558  ? PLT 306 01/01/2022 1536  ? PLT 290 08/20/2021 1558  ? MCV 95.5 01/01/2022 1536  ? MCV 93 08/20/2021 1558  ? MCH 32.3 01/01/2022 1536  ? MCHC 33.8 01/01/2022 1536  ? RDW 12.5 01/01/2022 1536  ? RDW 12.6 08/20/2021 1558  ? LYMPHSABS 2,526 01/01/2022 1536  ? MONOABS 1.0 12/28/2019 1642  ? EOSABS 362 01/01/2022 1536  ? BASOSABS 40 01/01/2022 1536  ? ? ?Chest Imaging: ?HRCT Chest 12/11/21 reviewed by me with a few small foci of lung scarring, mosaicism on expiratory CT ? ?Pulmonary Functions Testing Results: ?   ?  View : No data to display.  ?  ?  ?  ? ? ? ? ?Echocardiogram:  ? ?TTE 10/17/21: ? 1. Left ventricular ejection fraction, by estimation, is 60 to 65%. Left  ?ventricular ejection fraction by 3D volume is 65 %. The left ventricle has  ?normal function. The left ventricle has no regional wall motion  ?abnormalities. Left ventricular diastolic  ?  parameters are consistent with Grade I diastolic dysfunction (impaired  ?relaxation).  ? 2. Right ventricular systolic function is normal. The right ventricular  ?size is normal. Tricuspid regurgitation signal is inadequate for assessing  ?PA pressure.  ? 3. The mitral valve is normal in structure. Trivial mitral valve  ?regurgitation.  ? 4. The aortic valve is tricuspid. Aortic valve regurgitation is not  ?visualized. No aortic stenosis is present.  ? 5. The inferior vena cava is normal in size with greater than 50%  ?respiratory variability, suggesting right atrial pressure of 3 mmHg.  ? ? ?   ?Assessment & Plan:  ? ?Engineer, water trapping ?# History of mild intermittent asthma ? ?# DM ?# Scleroderma  ?No findings on HRCT clearly suggestive of development of related ILD. ? ?Plan: ?- PFTs ?- prn albuterol ? ? ?RTC 6 weeks ? ?Maryjane Hurter, MD ?[facility] ?01/30/2022 4:02 PM  ? ?

## 2022-02-02 ENCOUNTER — Ambulatory Visit (INDEPENDENT_AMBULATORY_CARE_PROVIDER_SITE_OTHER): Payer: BC Managed Care – PPO | Admitting: Internal Medicine

## 2022-02-02 ENCOUNTER — Encounter: Payer: Self-pay | Admitting: Internal Medicine

## 2022-02-02 VITALS — BP 104/70 | HR 88 | Ht 68.0 in | Wt 212.0 lb

## 2022-02-02 DIAGNOSIS — K625 Hemorrhage of anus and rectum: Secondary | ICD-10-CM

## 2022-02-02 DIAGNOSIS — R198 Other specified symptoms and signs involving the digestive system and abdomen: Secondary | ICD-10-CM

## 2022-02-02 DIAGNOSIS — R1310 Dysphagia, unspecified: Secondary | ICD-10-CM

## 2022-02-02 DIAGNOSIS — M3310 Other dermatopolymyositis, organ involvement unspecified: Secondary | ICD-10-CM

## 2022-02-02 NOTE — Progress Notes (Signed)
? ?Chief Complaint: Alternating constipation and diarrhea, dysphagia ? ?HPI : 46 year old female wit history of dermatomyositis, scleroderma, DM, asthma, HFpEF presents with changes in bowel habits and dysphagia ? ?She has been told that she has an abdominal hernia. 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 She has been on the Rybelsus medication for the last few months. When she has taken metformin in the past, she would have worsened diarrhea. Now she will experience diarrhea that alternates with constipation. Sometimes she will have abdominal pains. Her stomach pain is located in her lower abdomen. She has been struggling to follow a healthy diet. She has been gaining weight. Denies N&V currently. She has had hemorrhoids on occasion for years. Sometimes the hemorrhoids will flare up and cause scant amounts of rectal bleeding. If she is more constipated, it is more likely to agitate the hemorrhoids. She does have dysphagia, but this depends on what she eats. If she eats really dry foods or meats, then she has trouble swallowing. This dysphagia has been stable over time. She has had a barium swallow test in the past. Denies prior EGD or colonoscopy. Denies fam hx of GI cancers.  ? ?Past Medical History:  ?Diagnosis Date  ? Asthma   ? Dermatolysis   ? Hypertension   ? Lymphedema   ? dx by vascular doctor  ? Pre-diabetes   ? ? ? ?Past Surgical History:  ?Procedure Laterality Date  ? BREAST SURGERY    ? WISDOM TOOTH EXTRACTION    ? ?Family History  ?Problem Relation Age of Onset  ? Diabetes Father   ? Cancer Father   ?     prostate cancer in her 110s.  ? High Cholesterol Brother   ? Diabetes Paternal Grandmother   ? Alzheimer's disease Paternal Grandfather   ? Diabetes Son   ? Autism Son   ? Cancer Paternal Aunt   ?     uncertain what type of cancer  ? Colon cancer Neg Hx   ? ?Social History  ? ?Tobacco Use  ? Smoking status: Never  ? Smokeless tobacco: Never  ?Vaping Use  ? Vaping Use: Never used   ?Substance Use Topics  ? Alcohol use: Yes  ?  Comment: 1-2 drinks/months  ? Drug use: Never  ? ?Current Outpatient Medications  ?Medication Sig Dispense Refill  ? albuterol (VENTOLIN HFA) 108 (90 Base) MCG/ACT inhaler Inhale 2 puffs into the lungs every 6 (six) hours as needed for wheezing or shortness of breath. 8 g 2  ? amLODipine (NORVASC) 5 MG tablet Take 1 tablet (5 mg total) by mouth daily. 90 tablet 1  ? fluticasone (FLONASE) 50 MCG/ACT nasal spray Place 2 sprays into both nostrils daily. 18.2 mL 2  ? folic acid (FOLVITE) 1 MG tablet Take 2 tablets (2 mg total) by mouth daily. 180 tablet 1  ? hydrOXYzine (ATARAX) 25 MG tablet Take 1 tablet (25 mg total) by mouth 3 (three) times daily as needed. 270 tablet 1  ? levonorgestrel (MIRENA) 20 MCG/DAY IUD by Intrauterine route.    ? methotrexate (RHEUMATREX) 2.5 MG tablet Take 8 tablets (20 mg total) by mouth once a week. Caution:Chemotherapy. Protect from light. 32 tablet 2  ? mupirocin ointment (BACTROBAN) 2 % Apply 1 application topically 2 (two) times daily. 30 g 3  ? RYBELSUS 7 MG TABS TAKE 1 TABLET BY MOUTH ONCE DAILY 90 tablet 0  ? Vitamin D, Ergocalciferol, (DRISDOL) 1.25 MG (50000 UNIT) CAPS capsule Take 1  capsule (50,000 Units total) by mouth every 7 (seven) days. 24 capsule 1  ? ?No current facility-administered medications for this visit.  ? ?Allergies  ?Allergen Reactions  ? Cat Hair Extract Hives and Itching  ? Mixed Ragweed Other (See Comments)  ? Pollinex-T [Modified Tree Tyrosine Adsorbate] Other (See Comments)  ? ? ? ?Review of Systems: ?All systems reviewed and negative except where noted in HPI.  ? ?Physical Exam: ?BP 104/70   Pulse 88   Ht '5\' 8"'$  (1.727 m)   Wt 212 lb (96.2 kg)   BMI 32.23 kg/m?  ?Constitutional: Pleasant,well-developed, female in no acute distress. ?HEENT: Normocephalic and atraumatic. Conjunctivae are normal. No scleral icterus. ?Cardiovascular: Normal rate, regular rhythm.  ?Pulmonary/chest: Effort normal and breath  sounds normal. No wheezing, rales or rhonchi. ?Abdominal: Soft, nondistended, nontender. Bowel sounds active throughout. There are no masses palpable. No hepatomegaly. ?Extremities: No edema ?Neurological: Alert and oriented to person place and time. ?Skin: Skin is warm and dry. No rashes noted. ?Psychiatric: Normal mood and affect. Behavior is normal. ? ?Labs 12/2021: CBC unremarkable. CMP unremarkable. ? ?Labs 01/2022: TSH nml ? ?CT chest 11/29/21: ?IMPRESSION: ?1. There are some very mild fibrotic changes in the lungs, which are nonspecific in appearance, at this time categorized as most compatible with an alternative diagnosis (not usual interstitial pneumonia) per current ATS guidelines. Given the unusual distribution, these are favored to represent areas of mild post infectious or inflammatory scarring. However, if there is persistent ?clinical concern for interstitial lung disease, repeat ?high-resolution chest CT should be considered in 12 months to assess for temporal changes in the appearance of the lung parenchyma. ?2. Moderate air trapping indicative of small airways disease. ? ?CT A/P w/contrast 12/12/21: ?IMPRESSION: ?No acute findings in the abdomen or pelvis. ?3.6 cm left ovarian cyst. No follow-up imaging recommended. Note: ?This recommendation does not apply to premenarchal patients and to ?those with increased risk (genetic, family history, elevated tumor ?markers or other high-risk factors) of ovarian cancer. Reference: ?JACR 2020 Feb; 17(2):248-254 ?Low-density areas in the uterus, likely fibroids. ? ?ASSESSMENT AND PLAN: ?Alternating constipation and diarrhea ?Rectal bleeding ?Dysphagia ?Dermatomyositis ?Patient presents with dysphagia, scant rectal bleeding, and alternating constipation and diarrhea. Since patient has history of dermatomyositis, which has an association with malignancies, will plan for both EGD and colonoscopy for further evaluation. I went over the risks and benefits of both  procedures with the patient, and she is agreeable to proceeding. ?- EGD/colonsocopy [facility] ? ?Christia Reading, MD ? ?

## 2022-02-02 NOTE — Patient Instructions (Signed)
You have been scheduled for an endoscopy and colonoscopy. Please follow the written instructions given to you at your visit today. ?Please pick up your prep supplies at the pharmacy within the next 1-3 days. ?If you use inhalers (even only as needed), please bring them with you on the day of your procedure.  ? ?Due to recent changes in healthcare laws, you may see the results of your imaging and laboratory studies on MyChart before your provider has had a chance to review them.  We understand that in some cases there may be results that are confusing or concerning to you. Not all laboratory results come back in the same time frame and the provider may be waiting for multiple results in order to interpret others.  Please give [place] 48 hours in order for your provider to thoroughly review all the results before contacting the office for clarification of your results.   ? ?Thank you for entrusting me with your care and for choosing [facility], ?Dr. Christia Reading  ?

## 2022-02-04 ENCOUNTER — Encounter (HOSPITAL_COMMUNITY): Payer: Self-pay | Admitting: Physical Therapy

## 2022-02-04 ENCOUNTER — Ambulatory Visit (HOSPITAL_COMMUNITY): Payer: BC Managed Care – PPO | Attending: Vascular Surgery | Admitting: Physical Therapy

## 2022-02-04 DIAGNOSIS — I890 Lymphedema, not elsewhere classified: Secondary | ICD-10-CM | POA: Diagnosis not present

## 2022-02-04 NOTE — Therapy (Signed)
?OUTPATIENT PHYSICAL THERAPY ONCOLOGY EVALUATION ? ?Patient Name: Patricia Krause ?MRN: [id number] ?DOB:1976/04/29, 46 y.o., female ?Today's Date: 02/04/2022 ? ? PT End of Session - 02/04/22 1442   ? ? Visit Number 1   ? Number of Visits 18   ? Date for PT Re-Evaluation 03/18/22   ? Authorization Type BCBS/medicaid   ? PT Start Time 2409   ? PT Stop Time 1600   ? PT Time Calculation (min) 75 min   ? ?  ?  ? ?  ? ? ?Past Medical History:  ?Diagnosis Date  ? Asthma   ? Dermatolysis   ? Hypertension   ? Lymphedema   ? dx by vascular doctor  ? Pre-diabetes   ? ?Past Surgical History:  ?Procedure Laterality Date  ? BREAST SURGERY    ? WISDOM TOOTH EXTRACTION    ? ?Patient Active Problem List  ? Diagnosis Date Noted  ? Adult onset dermatomyositis ([place]) 12/02/2021  ? Raynaud's syndrome without gangrene 12/02/2021  ? History of asthma 11/06/2021  ? Vitamin D deficiency 11/06/2021  ? Prediabetes 11/06/2021  ? Essential hypertension 11/06/2021  ? Hypokalemia 08/20/2021  ? ? ?PCP: [name], FNP ? ?REFERRING PROVIDER: Cherre Robins, MD ? ?REFERRING DIAG: B LE lymphedema  ? ?THERAPY DIAG:  ?Lymphedema  ? ?ONSET DATE: chronic  ? ?SUBJECTIVE                                                                                                                                                                                          ? ?SUBJECTIVE STATEMENT:   Pt states for the most part she does not have any pain with her legs.  She states that the swelling was increasing so she went to her MD who referred her to [facility] which is to far to go therefore she came to this facility.  The patient does not have a compression pump and has never  had any compression bandages.  ? ?PERTINENT HISTORY:   dermatomyositis, raynauds, HTN  ? ?PAIN:  ?Are you having pain? No ? ?PRECAUTIONS: Other: cellulitis  ? ?WEIGHT BEARING RESTRICTIONS No ? ?FALLS:  ?Has patient fallen in last 6 months? No ? ?LIVING ENVIRONMENT: ?Lives with: lives with their family  and lives with their spouse ?Lives in: House/apartment ?Stairs: Yes: External: 5 steps; on right going up ?Has following equipment at home: None ? ?OCCUPATION: none ? ?LEISURE: read, shows, music  ? ?PRIOR LEVEL OF FUNCTION: Independent ? ?PATIENT GOALS swelling to go down  ? ? ?OBJECTIVE ? ?COGNITION: ? Overall cognitive status: Within functional limits for tasks assessed  ? ?PALPATION: Noted induration B LE  ? ? ? ? ?  LYMPHEDEMA ASSESSMENTS:  ? ?SURGERY TYPE/DATE: none  ? ? ? ? ?LE LANDMARK RIGHT ?02/04/2022  ?At groin   ?30 cm proximal to suprapatella 68  ?20 cm proximal to suprapatella 69  ?10 cm proximal to suprapatella 65.5  ?At [place] / popliteal crease 50.8  ?30 cm proximal to floor at lateral plantar foot 58  ?20 cm proximal to floor at lateral plantar foot 49.5  ?10 cm proximal to floor at lateral plantar foot 43.5  ?Circumference of ankle/heel 43.4  ?5 cm proximal to 1st MTP joint 30.8  ?Across MTP joint 30.5  ?Around proximal great toe   ?(Blank rows = not tested) ? ?LE LANDMARK LEFT ?02/04/2022  ?At groin   ?30 cm proximal to suprapatella 66  ?20 cm proximal to suprapatella 67  ?10 cm proximal to suprapatella 66.6  ?At midpatella / popliteal crease 54.5  ?30 cm proximal to floor at lateral plantar foot 54  ?20 cm proximal to floor at lateral plantar foot 42.8  ?10 cm proximal to floor at lateral plantar foot 36  ?Circumference of ankle/heel 40.2  ?5 cm proximal to 1st MTP joint 29.8  ?Across MTP joint 28.8  ?Around proximal great toe   ?(Blank rows = not tested) ? ? ? ? ? ?TODAY'S TREATMENT  ?Informed pt that Lymphedema is a condition that is controled not cured.  Explained the four aspects for treating lymphedema. ?1) skin care, 2) exercise, 3)manual 4) compression bandaging.  ? ?PATIENT EDUCATION:  ?Education details: HEP for exercises as well as self manual  ?Person educated: Patient ?Education method: Explanation, Demonstration, and Handouts ?Education comprehension: verbalized understanding,  returned demonstration, verbal cues required, and needs further education ? ? ?HOME EXERCISE PROGRAM: ?Ankle pumps, LAQ, hip ab/adduction, marching, diaphragm breathing ? ?ASSESSMENT: ? ?CLINICAL IMPRESSION: ?Patient is a 46 y.o. female who was seen today for physical therapy evaluation and treatment for B LE lymphedema.   Ms. Schindel was educated on Lymphedema and the four phases of treatment.  Therapist explained that it is imperative for the patient to come three times a week for progress to be made.  The patient lives 50 minutes away and does not feel that she can commit to this at this time.  The plan therefore, is for the patient to complete skin car, complete her exercises, complete self manual and pump 2x a day to see how the patient reduces using these strategies.  If she does not note any reduction she will have her MD refer her back to this clinic in the fall.  ? ? ?OBJECTIVE IMPAIRMENTS Abnormal gait, decreased endurance, difficulty walking, decreased ROM, and increased edema.  ? ?ACTIVITY LIMITATIONS cleaning, community activity, occupation, and shopping.  ? ?PERSONAL FACTORS dermatomyositis, raynauds, HTN  ? are also affecting patient's functional outcome.  ? ? ?REHAB POTENTIAL: Fair attempting to reduce at home  ? ?CLINICAL DECISION MAKING: Evolving/moderate complexity ? ?EVALUATION COMPLEXITY: Moderate ? ?GOALS: ?Goals reviewed with patient? Yes ? ?SHORT TERM GOALS: Target date: 02/04/2022 ? ?Pt to be I in HEP  ?Baseline: ?Goal status: MET ? ?2.  PT to be I in self manual techniques  ?Baseline:  ?Goal status: INITIAL ? ?PLAN: ?PT FREQUENCY: 1x/week ? ?PT DURATION: 1 week ? ?PLANNED INTERVENTIONS: Therapeutic exercises and Patient/Family education ? ?PLAN FOR NEXT SESSION: PT lives 50 minutes away from clinic is unable to come to therapy 3x a week at this time.   ? ?Rayetta Humphrey, PT CLT ?[phone]  ?02/04/2022, 4:46 PM  ?

## 2022-02-09 ENCOUNTER — Ambulatory Visit: Payer: BC Managed Care – PPO

## 2022-02-09 ENCOUNTER — Ambulatory Visit (HOSPITAL_COMMUNITY): Payer: BC Managed Care – PPO | Admitting: Physical Therapy

## 2022-02-10 ENCOUNTER — Encounter (HOSPITAL_COMMUNITY): Payer: BC Managed Care – PPO | Admitting: Physical Therapy

## 2022-02-11 ENCOUNTER — Encounter (HOSPITAL_COMMUNITY): Payer: BC Managed Care – PPO | Admitting: Physical Therapy

## 2022-02-11 DIAGNOSIS — F4310 Post-traumatic stress disorder, unspecified: Secondary | ICD-10-CM | POA: Diagnosis not present

## 2022-02-11 DIAGNOSIS — F4320 Adjustment disorder, unspecified: Secondary | ICD-10-CM | POA: Diagnosis not present

## 2022-02-12 ENCOUNTER — Ambulatory Visit
Admission: RE | Admit: 2022-02-12 | Discharge: 2022-02-12 | Disposition: A | Discharge status: Home / Self Care | Payer: BC Managed Care – PPO | Source: Ambulatory Visit | Attending: Nurse Practitioner | Admitting: Nurse Practitioner

## 2022-02-12 DIAGNOSIS — Z1231 Encounter for screening mammogram for malignant neoplasm of breast: Secondary | ICD-10-CM

## 2022-02-13 ENCOUNTER — Encounter (HOSPITAL_COMMUNITY): Payer: BC Managed Care – PPO | Admitting: Physical Therapy

## 2022-02-13 NOTE — Progress Notes (Signed)
Mammogram read by Dr. Joneen Caraway is negative.

## 2022-02-16 ENCOUNTER — Encounter (HOSPITAL_COMMUNITY): Payer: BC Managed Care – PPO | Admitting: Physical Therapy

## 2022-02-16 ENCOUNTER — Encounter: Payer: Self-pay | Admitting: Vascular Surgery

## 2022-02-17 DIAGNOSIS — F4310 Post-traumatic stress disorder, unspecified: Secondary | ICD-10-CM | POA: Diagnosis not present

## 2022-02-17 DIAGNOSIS — F4320 Adjustment disorder, unspecified: Secondary | ICD-10-CM | POA: Diagnosis not present

## 2022-02-18 ENCOUNTER — Encounter (HOSPITAL_COMMUNITY): Payer: BC Managed Care – PPO

## 2022-02-19 ENCOUNTER — Encounter: Payer: BC Managed Care – PPO | Admitting: Family Medicine

## 2022-02-20 ENCOUNTER — Encounter (HOSPITAL_COMMUNITY): Payer: BC Managed Care – PPO

## 2022-02-23 ENCOUNTER — Other Ambulatory Visit: Payer: Self-pay

## 2022-02-23 ENCOUNTER — Encounter (HOSPITAL_COMMUNITY): Payer: BC Managed Care – PPO | Admitting: Physical Therapy

## 2022-02-23 ENCOUNTER — Encounter: Payer: Self-pay | Admitting: Nurse Practitioner

## 2022-02-23 DIAGNOSIS — D219 Benign neoplasm of connective and other soft tissue, unspecified: Secondary | ICD-10-CM

## 2022-02-23 DIAGNOSIS — N83202 Unspecified ovarian cyst, left side: Secondary | ICD-10-CM

## 2022-02-25 ENCOUNTER — Encounter (HOSPITAL_COMMUNITY): Payer: BC Managed Care – PPO | Admitting: Physical Therapy

## 2022-02-26 DIAGNOSIS — F4320 Adjustment disorder, unspecified: Secondary | ICD-10-CM | POA: Diagnosis not present

## 2022-02-26 DIAGNOSIS — F4310 Post-traumatic stress disorder, unspecified: Secondary | ICD-10-CM | POA: Diagnosis not present

## 2022-03-02 ENCOUNTER — Encounter (HOSPITAL_COMMUNITY): Payer: BC Managed Care – PPO | Admitting: Physical Therapy

## 2022-03-02 DIAGNOSIS — F4320 Adjustment disorder, unspecified: Secondary | ICD-10-CM | POA: Diagnosis not present

## 2022-03-02 DIAGNOSIS — F4310 Post-traumatic stress disorder, unspecified: Secondary | ICD-10-CM | POA: Diagnosis not present

## 2022-03-03 ENCOUNTER — Other Ambulatory Visit: Payer: BC Managed Care – PPO

## 2022-03-03 ENCOUNTER — Other Ambulatory Visit: Payer: BC Managed Care – PPO | Admitting: Obstetrics and Gynecology

## 2022-03-04 ENCOUNTER — Encounter (HOSPITAL_COMMUNITY): Payer: BC Managed Care – PPO

## 2022-03-05 ENCOUNTER — Ambulatory Visit: Payer: BC Managed Care – PPO

## 2022-03-06 ENCOUNTER — Encounter (HOSPITAL_COMMUNITY): Payer: BC Managed Care – PPO

## 2022-03-10 ENCOUNTER — Encounter: Payer: BC Managed Care – PPO | Admitting: Nurse Practitioner

## 2022-03-10 DIAGNOSIS — F4310 Post-traumatic stress disorder, unspecified: Secondary | ICD-10-CM | POA: Diagnosis not present

## 2022-03-10 DIAGNOSIS — F4320 Adjustment disorder, unspecified: Secondary | ICD-10-CM | POA: Diagnosis not present

## 2022-03-10 DIAGNOSIS — I890 Lymphedema, not elsewhere classified: Secondary | ICD-10-CM | POA: Diagnosis not present

## 2022-03-11 ENCOUNTER — Encounter (HOSPITAL_COMMUNITY): Payer: BC Managed Care – PPO

## 2022-03-12 HISTORY — PX: COLONOSCOPY: SHX174

## 2022-03-13 ENCOUNTER — Encounter (HOSPITAL_COMMUNITY): Payer: BC Managed Care – PPO | Admitting: Physical Therapy

## 2022-03-16 DIAGNOSIS — F4310 Post-traumatic stress disorder, unspecified: Secondary | ICD-10-CM | POA: Diagnosis not present

## 2022-03-16 DIAGNOSIS — F4320 Adjustment disorder, unspecified: Secondary | ICD-10-CM | POA: Diagnosis not present

## 2022-03-17 ENCOUNTER — Ambulatory Visit (INDEPENDENT_AMBULATORY_CARE_PROVIDER_SITE_OTHER): Payer: BC Managed Care – PPO | Admitting: Nurse Practitioner

## 2022-03-17 ENCOUNTER — Encounter: Payer: Self-pay | Admitting: Nurse Practitioner

## 2022-03-17 VITALS — BP 118/78 | HR 89 | Temp 98.0°F | Ht 68.0 in | Wt 209.2 lb

## 2022-03-17 DIAGNOSIS — E8881 Metabolic syndrome: Secondary | ICD-10-CM

## 2022-03-17 DIAGNOSIS — E6609 Other obesity due to excess calories: Secondary | ICD-10-CM

## 2022-03-17 DIAGNOSIS — I890 Lymphedema, not elsewhere classified: Secondary | ICD-10-CM | POA: Diagnosis not present

## 2022-03-17 DIAGNOSIS — R7303 Prediabetes: Secondary | ICD-10-CM

## 2022-03-17 DIAGNOSIS — Z6831 Body mass index (BMI) 31.0-31.9, adult: Secondary | ICD-10-CM

## 2022-03-17 MED ORDER — SAXENDA 18 MG/3ML ~~LOC~~ SOPN
3.0000 mg | PEN_INJECTOR | Freq: Every day | SUBCUTANEOUS | 1 refills | Status: DC
Start: 2022-03-17 — End: 2022-04-01

## 2022-03-17 NOTE — Patient Instructions (Addendum)

## 2022-03-17 NOTE — Progress Notes (Signed)
I,Patricia Krause,acting as a Education administrator for Patricia Brine, FNP.,have documented all relevant documentation on the behalf of Patricia Brine, FNP,as directed by  Patricia Brine, FNP while in the presence of Patricia Krause, Patricia Krause.   This visit occurred during the SARS-CoV-2 public health emergency.  Safety protocols were in place, including screening questions prior to the visit, additional usage of staff PPE, and extensive cleaning of exam room while observing appropriate contact time as indicated for disinfecting solutions.  Subjective:     Patient ID: Patricia Krause , female    DOB: November 24, 1975 , 46 y.o.   MRN: [id number]   No chief complaint on file.   HPI  Pt presents today for a alternative to help with her blood sugar along with weight. She is interested in rybelsus which she knows agrees with her. Pt states metformin she feel is not doing enough & she reports it gives her diarrhea. She also tried extended release metformin and she still suffered from the side effects. She is no longer taking due to the GI upset.  She stopped rybelsus last month being her insurance stopped paying for med. She has a diff insurance now.    Her pap is sch for November this year. She is referred to a gynecologist for abnormal CT abdomen/pelvis She has been to the lymphedema clinic in [place] and she now has a pump at home and she only has one car.   She is dancing during the week M-F for her exercise. She does admit to her diet needing work. She does realize she eats heavy meals. She drinks water 32 oz water a day. Occasionally will drink tea. She does drink juice sometimes.   Wt Readings from Last 3 Encounters: 03/17/22 : 209 lb 3.2 oz (94.9 kg) 02/02/22 : 212 lb (96.2 kg) 01/30/22 : 209 lb 6.4 oz (95 kg)      Past Medical History:  Diagnosis Date   Asthma    Dermatolysis    Hypertension    Lymphedema    dx by vascular doctor   Pre-diabetes      Family History  Problem Relation Age of Onset    Diabetes Father    Cancer Father        prostate cancer in her 51s.   Cancer Paternal Aunt        uncertain what type of cancer   Diabetes Paternal Grandmother    Alzheimer's disease Paternal Grandfather    High Cholesterol Brother    Diabetes Son    Autism Son    Colon cancer Neg Hx    Breast cancer Neg Hx      Current Outpatient Medications:    albuterol (VENTOLIN HFA) 108 (90 Base) MCG/ACT inhaler, Inhale 2 puffs into the lungs every 6 (six) hours as needed for wheezing or shortness of breath., Disp: 8 g, Rfl: 2   amLODipine (NORVASC) 5 MG tablet, Take 1 tablet (5 mg total) by mouth daily., Disp: 90 tablet, Rfl: 1   fluticasone (FLONASE) 50 MCG/ACT nasal spray, Place 2 sprays into both nostrils daily., Disp: 50.3 mL, Rfl: 2   folic acid (FOLVITE) 1 MG tablet, Take 2 tablets (2 mg total) by mouth daily., Disp: 180 tablet, Rfl: 1   hydrOXYzine (ATARAX) 25 MG tablet, Take 1 tablet (25 mg total) by mouth 3 (three) times daily as needed., Disp: 270 tablet, Rfl: 1   levonorgestrel (MIRENA) 20 MCG/DAY IUD, by Intrauterine route., Disp: , Rfl:    Liraglutide -Weight Management (SAXENDA)  18 MG/3ML SOPN, Inject 3 mg into the skin daily., Disp: 15 mL, Rfl: 1   methotrexate (RHEUMATREX) 2.5 MG tablet, Take 8 tablets (20 mg total) by mouth once a week. Caution:Chemotherapy. Protect from light., Disp: 32 tablet, Rfl: 2   mupirocin ointment (BACTROBAN) 2 %, Apply 1 application topically 2 (two) times daily., Disp: 30 g, Rfl: 3   Vitamin D, Ergocalciferol, (DRISDOL) 1.25 MG (50000 UNIT) CAPS capsule, Take 1 capsule (50,000 Units total) by mouth every 7 (seven) days., Disp: 24 capsule, Rfl: 1   Allergies  Allergen Reactions   Cat Hair Extract Hives and Itching   Mixed Ragweed Other (See Comments)   Pollinex-T [Modified Tree Tyrosine Adsorbate] Other (See Comments)     Review of Systems  Constitutional: Negative.   Respiratory: Negative.    Cardiovascular:  Positive for leg swelling (bilateral  lymphedema). Negative for palpitations.  Neurological: Negative.   Psychiatric/Behavioral: Negative.       Today's Vitals   03/17/22 1522  BP: 118/78  Pulse: 89  Temp: 98 F (36.7 C)  SpO2: 97%  Weight: 209 lb 3.2 oz (94.9 kg)  Height: _0  (1.727 m)  PainSc: 0-No pain   Body mass index is 31.81 kg/m.  Wt Readings from Last 3 Encounters:  03/26/22 208 lb (94.3 kg)  03/17/22 209 lb 3.2 oz (94.9 kg)  02/02/22 212 lb (96.2 kg)    Objective:  Physical Exam Vitals reviewed.  Constitutional:      General: She is not in acute distress.    Appearance: Normal appearance. She is obese.  Cardiovascular:     Rate and Rhythm: Normal rate and regular rhythm.     Pulses: Normal pulses.     Heart sounds: Normal heart sounds. No murmur heard. Pulmonary:     Effort: Pulmonary effort is normal. No respiratory distress.     Breath sounds: Normal breath sounds. No wheezing.  Musculoskeletal:     Right lower leg: Edema (lymphedema) present.     Left lower leg: Edema (lymphedema) present.  Neurological:     General: No focal deficit present.     Mental Status: She is alert and oriented to person, place, and time.     Cranial Nerves: No cranial nerve deficit.     Motor: No weakness.  Psychiatric:        Mood and Affect: Mood normal.        Behavior: Behavior normal.        Thought Content: Thought content normal.        Judgment: Judgment normal.         Assessment And Plan:     1. Prediabetes Comments: She is unable to tolerate metformin. Discussed she is unable to get Rybelsus or Ozempic due to not being diagnosed with diabetes, verbalizes understanding - Hemoglobin A1c - BMP8+eGFR  2. Insulin resistance Comments: Unable to tolerate metformin, if she is approved for Saxenda this will help. Encouraged to limit intake of sugary foods and drinks, carbs. - Insulin, random - BMP8+eGFR  3. Class 1 obesity due to excess calories without serious comorbidity with body mass index  (BMI) of 31.0 to 31.9 in adult Comments: No Saxenda Samples she is to pick up from pharmacy once approved and call office to schedule appt for teaching - Liraglutide -Weight Management (SAXENDA) 18 MG/3ML SOPN; Inject 3 mg into the skin daily.  Dispense: 15 mL; Refill: 1  4. Lymphedema Comments: She has a pump for her legs, unable to go to  PT at this time due to transportation. discussed how this can affect her weight.     Patient was given opportunity to ask questions. Patient verbalized understanding of the plan and was able to repeat key elements of the plan. All questions were answered to their satisfaction.  Patricia Brine, FNP   I, Patricia Brine, FNP, have reviewed all documentation for this visit. The documentation on 03/17/22 for the exam, diagnosis, procedures, and orders are all accurate and complete.   IF YOU HAVE BEEN REFERRED TO A SPECIALIST, IT MAY TAKE 1-2 WEEKS TO SCHEDULE/PROCESS THE REFERRAL. IF YOU HAVE NOT HEARD FROM US/SPECIALIST IN TWO WEEKS, PLEASE GIVE [place] A CALL AT [phone].   THE PATIENT IS ENCOURAGED TO PRACTICE SOCIAL DISTANCING DUE TO THE COVID-19 PANDEMIC.

## 2022-03-18 ENCOUNTER — Encounter: Payer: Self-pay | Admitting: Nurse Practitioner

## 2022-03-18 LAB — BMP8+EGFR
BUN/Creatinine Ratio: 18 (ref 9–23)
BUN: 9 mg/dL (ref 6–24)
CO2: 23 mmol/L (ref 20–29)
Calcium: 9.3 mg/dL (ref 8.7–10.2)
Chloride: 103 mmol/L (ref 96–106)
Creatinine, Ser: 0.49 mg/dL — ABNORMAL LOW (ref 0.57–1.00)
Glucose: 150 mg/dL — ABNORMAL HIGH (ref 70–99)
Potassium: 4.3 mmol/L (ref 3.5–5.2)
Sodium: 138 mmol/L (ref 134–144)
eGFR: 119 mL/min/{1.73_m2} (ref >59)

## 2022-03-18 LAB — HEMOGLOBIN A1C
Est. average glucose Bld gHb Est-mCnc: 114 mg/dL
Hgb A1c MFr Bld: 5.6 % (ref 4.8–5.6)

## 2022-03-18 LAB — INSULIN, RANDOM: INSULIN: 115 u[IU]/mL — ABNORMAL HIGH (ref 2.6–24.9)

## 2022-03-19 ENCOUNTER — Ambulatory Visit: Payer: BC Managed Care – PPO | Admitting: Student

## 2022-03-23 DIAGNOSIS — F4310 Post-traumatic stress disorder, unspecified: Secondary | ICD-10-CM | POA: Diagnosis not present

## 2022-03-23 DIAGNOSIS — F4320 Adjustment disorder, unspecified: Secondary | ICD-10-CM | POA: Diagnosis not present

## 2022-03-24 ENCOUNTER — Encounter: Payer: Self-pay | Admitting: Nurse Practitioner

## 2022-03-25 NOTE — Progress Notes (Signed)
Synopsis: Referred for ILD by [name], FNP  Subjective:   PATIENT ID: Patricia Krause GENDER: female DOB: [birth year], MRN: [id number]  Chief Complaint  Patient presents with   Follow-up    PFT done today. Breathing is overall doing well. She has to clear her throat occ. Rarely uses albuterol inhaler.    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 Estanislado Pandy on MTX (has been on it for a couple months here but was on it primarily from 2005-2018), lymphedema, mild asthma on albuterol. Had covid-19 in August of last year - symptomatically pretty severe but not hospitalized.   She has DOE only with strenuous exertion. Been an issue over the last few years. No cough. No orthopnea.  No snoring. No PND. Occasionally sleepy during the day but attributes this to insufficient sleep, prefers to stay up late. Has to be careful when she swallows - can run into choking when eating/drinking.   Previous immunosuppression includes: MMF, plaquenil, and IVIG. No issues with these medications.  Cousin with autoimmune disease but she is unsure what it is. No family history of lung cancer.   She works from home, Writer.   Interval HPI  PFTs today are normal. No change in minimal DOE since last visit. Still getting over a cold.   Otherwise pertinent review of systems is negative.   Past Medical History:  Diagnosis Date   Asthma    Dermatolysis    Hypertension    Lymphedema    dx by vascular doctor   Pre-diabetes      Family History  Problem Relation Age of Onset   Diabetes Father    Cancer Father        prostate cancer in her 77s.   Cancer Paternal Aunt        uncertain what type of cancer   Diabetes Paternal Grandmother    Alzheimer's disease Paternal Grandfather    High Cholesterol Brother    Diabetes Son    Autism Son    Colon cancer Neg Hx    Breast cancer Neg Hx      Past Surgical History:  Procedure  Laterality Date   BREAST EXCISIONAL BIOPSY Left    BREAST SURGERY     WISDOM TOOTH EXTRACTION      Social History   Socioeconomic History   Marital status: Married    Spouse name: Not on file   Number of children: Not on file   Years of education: Not on file   Highest education level: Not on file  Occupational History   Not on file  Tobacco Use   Smoking status: Never   Smokeless tobacco: Never  Vaping Use   Vaping Use: Never used  Substance and Sexual Activity   Alcohol use: Yes    Comment: 1-2 drinks/months   Drug use: Never   Sexual activity: Yes    Birth control/protection: I.U.D.    Comment: Mirena 2020  Other Topics Concern   Not on file  Social History Narrative   Not on file   Social Determinants of Health   Financial Resource Strain: Not on file  Food Insecurity: Not on file  Transportation Needs: Not on file  Physical Activity: Not on file  Stress: Not on file  Social Connections: Not on file  Intimate Partner Violence: Not on file     Allergies  Allergen Reactions   Cat Hair Extract Hives and Itching   Mixed  Ragweed Other (See Comments)   Pollinex-T [Modified Tree Tyrosine Adsorbate] Other (See Comments)     Outpatient Medications Prior to Visit  Medication Sig Dispense Refill   albuterol (VENTOLIN HFA) 108 (90 Base) MCG/ACT inhaler Inhale 2 puffs into the lungs every 6 (six) hours as needed for wheezing or shortness of breath. 8 g 2   amLODipine (NORVASC) 5 MG tablet Take 1 tablet (5 mg total) by mouth daily. 90 tablet 1   fluticasone (FLONASE) 50 MCG/ACT nasal spray Place 2 sprays into both nostrils daily. 27.7 mL 2   folic acid (FOLVITE) 1 MG tablet Take 2 tablets (2 mg total) by mouth daily. 180 tablet 1   hydrOXYzine (ATARAX) 25 MG tablet Take 1 tablet (25 mg total) by mouth 3 (three) times daily as needed. 270 tablet 1   levonorgestrel (MIRENA) 20 MCG/DAY IUD by Intrauterine route.     Liraglutide -Weight Management (SAXENDA) 18 MG/3ML SOPN  Inject 3 mg into the skin daily. 15 mL 1   methotrexate (RHEUMATREX) 2.5 MG tablet Take 8 tablets (20 mg total) by mouth once a week. Caution:Chemotherapy. Protect from light. 32 tablet 2   mupirocin ointment (BACTROBAN) 2 % Apply 1 application topically 2 (two) times daily. 30 g 3   Vitamin D, Ergocalciferol, (DRISDOL) 1.25 MG (50000 UNIT) CAPS capsule Take 1 capsule (50,000 Units total) by mouth every 7 (seven) days. 24 capsule 1   No facility-administered medications prior to visit.       Objective:   Physical Exam:  General appearance: 46 y.o., female, NAD, conversant  Eyes: anicteric sclerae; PERRL, tracking appropriately HENT: NCAT; MMM Neck: Trachea midline; no lymphadenopathy, no JVD Lungs: CTAB, no crackles, no wheeze, with normal respiratory effort CV: RRR, no murmur  Abdomen: Soft, non-tender; non-distended, BS present  Extremities: BLE lymphedema, warm Neuro: Alert and oriented to person and place, no focal deficit     Vitals:   03/26/22 1612  BP: 110/62  Pulse: 76  Temp: 98 F (36.7 C)  TempSrc: Oral  SpO2: 96%  Weight: 208 lb (94.3 kg)  Height: '5\' 8"'$  (1.727 m)    96% on RA BMI Readings from Last 3 Encounters:  03/26/22 31.63 kg/m  03/17/22 31.81 kg/m  02/02/22 32.23 kg/m   Wt Readings from Last 3 Encounters:  03/26/22 208 lb (94.3 kg)  03/17/22 209 lb 3.2 oz (94.9 kg)  02/02/22 212 lb (96.2 kg)     CBC    Component Value Date/Time   WBC 6.7 01/01/2022 1536   RBC 3.75 (L) 01/01/2022 1536   HGB 12.1 01/01/2022 1536   HGB 13.0 08/20/2021 1558   HCT 35.8 01/01/2022 1536   HCT 37.4 08/20/2021 1558   PLT 306 01/01/2022 1536   PLT 290 08/20/2021 1558   MCV 95.5 01/01/2022 1536   MCV 93 08/20/2021 1558   MCH 32.3 01/01/2022 1536   MCHC 33.8 01/01/2022 1536   RDW 12.5 01/01/2022 1536   RDW 12.6 08/20/2021 1558   LYMPHSABS 2,526 01/01/2022 1536   MONOABS 1.0 12/28/2019 1642   EOSABS 362 01/01/2022 1536   BASOSABS 40 01/01/2022 1536     Chest Imaging: HRCT Chest 12/11/21 reviewed by me with a few small foci of lung scarring, mosaicism on expiratory CT  Pulmonary Functions Testing Results:    Latest Ref Rng & Units 03/26/2022    3:08 PM  PFT Results  FVC-Pre L 3.72  P  FVC-Predicted Pre % 106  P  FVC-Post L 3.33  P  FVC-Predicted Post % 95  P  Pre FEV1/FVC % % 70  P  Post FEV1/FCV % % 79  P  FEV1-Pre L 2.60  P  FEV1-Predicted Pre % 91  P  FEV1-Post L 2.64  P  DLCO uncorrected ml/min/mmHg 28.88  P  DLCO UNC% % 117  P  DLCO corrected ml/min/mmHg 30.16  P  DLCO COR %Predicted % 122  P  DLVA Predicted % 118  P  TLC L 6.69  P  TLC % Predicted % 118  P  RV % Predicted % 113  P    P Preliminary result      Echocardiogram:   TTE 10/17/21:  1. Left ventricular ejection fraction, by estimation, is 60 to 65%. Left  ventricular ejection fraction by 3D volume is 65 %. The left ventricle has  normal function. The left ventricle has no regional wall motion  abnormalities. Left ventricular diastolic   parameters are consistent with Grade I diastolic dysfunction (impaired  relaxation).   2. Right ventricular systolic function is normal. The right ventricular  size is normal. Tricuspid regurgitation signal is inadequate for assessing  PA pressure.   3. The mitral valve is normal in structure. Trivial mitral valve  regurgitation.   4. The aortic valve is tricuspid. Aortic valve regurgitation is not  visualized. No aortic stenosis is present.   5. The inferior vena cava is normal in size with greater than 50%  respiratory variability, suggesting right atrial pressure of 3 mmHg.       Assessment & Plan:   # Air trapping # History of mild intermittent asthma # Obstructive lung disease without BD response May all be due to asthma vs DM related bronchiolitis obliterans. Regardless symptoms are mild and she declines trial of LABA/ICS.   # DM # Scleroderma  No findings on HRCT clearly suggestive of development of  related ILD.  Plan: - spirometry in a year - look for any decline in lung function which could signal worsening asthma or bronchiolitis obliterans - declines trial of LABA/ICS  - prn albuterol   RTC 1 year with [place], MD [facility] 03/26/2022 5:44 PM

## 2022-03-26 ENCOUNTER — Encounter: Payer: Self-pay | Admitting: Student

## 2022-03-26 ENCOUNTER — Ambulatory Visit: Payer: BC Managed Care – PPO | Admitting: Student

## 2022-03-26 ENCOUNTER — Ambulatory Visit (INDEPENDENT_AMBULATORY_CARE_PROVIDER_SITE_OTHER): Payer: BC Managed Care – PPO | Admitting: Student

## 2022-03-26 VITALS — BP 110/62 | HR 76 | Temp 98.0°F | Ht 68.0 in | Wt 208.0 lb

## 2022-03-26 DIAGNOSIS — J4520 Mild intermittent asthma, uncomplicated: Secondary | ICD-10-CM | POA: Diagnosis not present

## 2022-03-26 DIAGNOSIS — R0609 Other forms of dyspnea: Secondary | ICD-10-CM

## 2022-03-26 DIAGNOSIS — J449 Chronic obstructive pulmonary disease, unspecified: Secondary | ICD-10-CM

## 2022-03-26 LAB — PULMONARY FUNCTION TEST
DL/VA % pred: 118 %
DL/VA: 5.03 ml/min/mmHg/L
DLCO cor % pred: 122 %
DLCO cor: 30.16 ml/min/mmHg
DLCO unc % pred: 117 %
DLCO unc: 28.88 ml/min/mmHg
FEF 25-75 Post: 1.57 L/sec
FEF 25-75 Pre: 1.65 L/sec
FEF2575-%Change-Post: -4 %
FEF2575-%Pred-Post: 52 %
FEF2575-%Pred-Pre: 55 %
FEV1-%Change-Post: 1 %
FEV1-%Pred-Post: 93 %
FEV1-%Pred-Pre: 91 %
FEV1-Post: 2.64 L
FEV1-Pre: 2.6 L
FEV1FVC-%Change-Post: 13 %
FEV1FVC-%Pred-Pre: 84 %
FEV6-%Change-Post: -9 %
FEV6-%Pred-Post: 97 %
FEV6-%Pred-Pre: 108 %
FEV6-Post: 3.32 L
FEV6-Pre: 3.68 L
FEV6FVC-%Change-Post: 0 %
FEV6FVC-%Pred-Post: 102 %
FEV6FVC-%Pred-Pre: 101 %
FVC-%Change-Post: -10 %
FVC-%Pred-Post: 95 %
FVC-%Pred-Pre: 106 %
FVC-Post: 3.33 L
FVC-Pre: 3.72 L
Post FEV1/FVC ratio: 79 %
Post FEV6/FVC ratio: 100 %
Pre FEV1/FVC ratio: 70 %
Pre FEV6/FVC Ratio: 99 %
RV % pred: 113 %
RV: 2.12 L
TLC % pred: 118 %
TLC: 6.69 L

## 2022-03-26 NOTE — Patient Instructions (Signed)
Full PFT Performed Today  

## 2022-03-26 NOTE — Patient Instructions (Addendum)
-   albuterol 1-2 puffs 5 to 20 minutes before strenuous exertion and as needed - see you in a year! - If worsening shortness of breath with exertion, cough, or CT concerning for scarring related to DM or scleroderma, happy to see you again in clinic to discuss before next visit

## 2022-03-26 NOTE — Progress Notes (Signed)
Full PFT Performed Today  

## 2022-03-30 NOTE — Progress Notes (Signed)
GYNECOLOGY  VISIT   HPI: 46 y.o.   Married  mixed race  female   G1P1 with No LMP recorded (lmp unknown). (Menstrual status: IUD).   here for [place] for f/u on left ovarian cyst/fibroids.     Patient had a CT scan done for cancer screening due to her hx of dermatomyositis.    Had CT scan 12/12/21 showing a 3.6 cm left ovarian cyst and IUD in uterus.  Small areas noted suggestive of being fibroids.   She is asking for a pap smear to be done at some time.   GYNECOLOGIC HISTORY: No LMP recorded (lmp unknown). (Menstrual status: IUD). Contraception: Mirena IUD 2019 Menopausal hormone therapy: n/a Last mammogram: 02/12/22-neg birads 1 Last pap smear: 5-6 years ago-WNL per pt in [place].          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[place]) 12/02/2021   Raynaud's syndrome without gangrene 12/02/2021   History of asthma 11/06/2021   Vitamin D deficiency 11/06/2021   Prediabetes 11/06/2021   Essential hypertension 11/06/2021   Hypokalemia 08/20/2021    Past Medical History:  Diagnosis Date   Asthma    Dermatolysis    Hypertension    Lymphedema    dx by vascular doctor   Pre-diabetes     Past Surgical History:  Procedure Laterality Date   BREAST EXCISIONAL BIOPSY Left    BREAST SURGERY     WISDOM TOOTH EXTRACTION      Current Outpatient Medications  Medication Sig Dispense Refill   albuterol (VENTOLIN HFA) 108 (90 Base) MCG/ACT inhaler Inhale 2 puffs into the lungs every 6 (six) hours as needed for wheezing or shortness of breath. 8 g 2   amLODipine (NORVASC) 5 MG tablet Take 1 tablet (5 mg total) by mouth daily. 90 tablet 1   fluticasone (FLONASE) 50 MCG/ACT nasal spray Place 2 sprays into both nostrils daily. 33.5 mL 2   folic acid (FOLVITE) 1 MG  tablet Take 2 tablets (2 mg total) by mouth daily. 180 tablet 1   hydrocortisone (ANUSOL-HC) 2.5 % rectal cream Place 1 Application rectally 2 (two) times daily. 30 g 1   hydrOXYzine (ATARAX) 25 MG tablet Take 1 tablet (25 mg total) by mouth 3 (three) times daily as needed. 270 tablet 1   methotrexate (RHEUMATREX) 2.5 MG tablet Take 8 tablets (20 mg total) by mouth once a week. Caution:Chemotherapy. Protect from light. 32 tablet 2   mupirocin ointment (BACTROBAN) 2 % Apply 1 application topically 2 (two) times daily. 30 g 3   Phentermine-Topiramate (QSYMIA) 3.75-23 MG CP24 Take 1 tablet by mouth daily. 14 capsule 0   Phentermine-Topiramate (QSYMIA) 7.5-46 MG CP24 Take 1 tablet by mouth daily. 30 capsule 3   Vitamin D, Ergocalciferol, (DRISDOL) 1.25 MG (50000 UNIT) CAPS capsule Take 1 capsule (50,000 Units total) by mouth every 7 (seven) days. 24 capsule 1   No current facility-administered medications for this visit.     ALLERGIES: Prunus  persica, Cat hair extract, Mixed ragweed, and Pollinex-t [modified tree tyrosine adsorbate]  Family History  Problem Relation Age of Onset   Diabetes Father    Cancer Father        prostate cancer in her 42s.   Cancer Paternal Aunt        uncertain what type of cancer   Diabetes Paternal Grandmother    Alzheimer's disease Paternal Grandfather    High Cholesterol Brother    Diabetes Son    Autism Son    Colon cancer Neg Hx    Breast cancer Neg Hx     Social History   Socioeconomic History   Marital status: Married    Spouse name: Not on file   Number of children: Not on file   Years of education: Not on file   Highest education level: Not on file  Occupational History   Not on file  Tobacco Use   Smoking status: Never   Smokeless tobacco: Never  Vaping Use   Vaping Use: Never used  Substance and Sexual Activity   Alcohol use: Yes    Comment: 1-2 drinks/months   Drug use: Never   Sexual activity: Yes    Birth control/protection:  I.U.D.    Comment: Mirena 2020  Other Topics Concern   Not on file  Social History Narrative   Not on file   Social Determinants of Health   Financial Resource Strain: Not on file  Food Insecurity: Not on file  Transportation Needs: Not on file  Physical Activity: Not on file  Stress: Not on file  Social Connections: Not on file  Intimate Partner Violence: Not on file    Review of Systems  All other systems reviewed and are negative.   PHYSICAL EXAMINATION:    BP 112/72   Pulse 89   LMP  (LMP Unknown)   SpO2 97%     General appearance: alert, cooperative and appears stated age  Pelvic US Uterus: 7.96 x 5.9 x 5.48 cm.  Fibroids, intramural and subserosal:  1.54 cm, 1.45 cm, 1.20 cm, 0.99 cm, 1.28 cm, 1.28 cm, 1.36 cm.  1.0 x 0.5 cm avascular hyperechoic area in the anterior myometrium. EMS:  not measured.  IUD in endometrial canal.  Left ovary 4.95 x 3.12 x 2.38 cm.  2 simple cysts noted, 2.74 cm and 2.65 cm.  Right ovary 2.54 x 1.39 x 1.36 cm.  No adnexal masses.  Small free fluid in left adnexa.   ASSESSMENT  Uterine fibroids.  Simple left ovarian cysts.  Mirena IUD.   PLAN  We discussed the fibroids and the simple cysts of the left ovary.  We discussed risk of ovarian torsion if cysts become large. Return for pelvic US in 8 weeks.  Will do a pap on the day of her visit prior to the ultrasound.    An After Visit Summary was printed and given to the patient.  30 min  total time was spent for this patient encounter, including preparation, face-to-face counseling with the patient, coordination of care, and documentation of the encounter.

## 2022-03-31 ENCOUNTER — Encounter: Payer: Self-pay | Admitting: Internal Medicine

## 2022-04-01 ENCOUNTER — Other Ambulatory Visit: Payer: Self-pay | Admitting: Nurse Practitioner

## 2022-04-01 DIAGNOSIS — E6609 Other obesity due to excess calories: Secondary | ICD-10-CM

## 2022-04-01 MED ORDER — QSYMIA 3.75-23 MG PO CP24: 1.0000 | ORAL_CAPSULE | Freq: Every day | ORAL | 0 refills | Status: DC

## 2022-04-01 MED ORDER — QSYMIA 7.5-46 MG PO CP24: 1.0000 | ORAL_CAPSULE | Freq: Every day | ORAL | 3 refills | Status: DC

## 2022-04-02 DIAGNOSIS — F4320 Adjustment disorder, unspecified: Secondary | ICD-10-CM | POA: Diagnosis not present

## 2022-04-02 DIAGNOSIS — F4310 Post-traumatic stress disorder, unspecified: Secondary | ICD-10-CM | POA: Diagnosis not present

## 2022-04-07 ENCOUNTER — Ambulatory Visit (AMBULATORY_SURGERY_CENTER): Payer: BC Managed Care – PPO | Admitting: Internal Medicine

## 2022-04-07 ENCOUNTER — Encounter: Payer: Self-pay | Admitting: Internal Medicine

## 2022-04-07 VITALS — BP 153/95 | HR 86 | Temp 98.0°F | Resp 11 | Ht 68.0 in | Wt 212.0 lb

## 2022-04-07 DIAGNOSIS — K648 Other hemorrhoids: Secondary | ICD-10-CM | POA: Diagnosis not present

## 2022-04-07 DIAGNOSIS — D123 Benign neoplasm of transverse colon: Secondary | ICD-10-CM

## 2022-04-07 DIAGNOSIS — K625 Hemorrhage of anus and rectum: Secondary | ICD-10-CM

## 2022-04-07 DIAGNOSIS — K635 Polyp of colon: Secondary | ICD-10-CM

## 2022-04-07 DIAGNOSIS — R1310 Dysphagia, unspecified: Secondary | ICD-10-CM

## 2022-04-07 DIAGNOSIS — M3310 Other dermatopolymyositis, organ involvement unspecified: Secondary | ICD-10-CM

## 2022-04-07 LAB — HM COLONOSCOPY

## 2022-04-07 MED ORDER — SODIUM CHLORIDE 0.9 % IV SOLN: 500.0000 mL | Freq: Once | INTRAVENOUS | Status: DC

## 2022-04-07 MED ORDER — HYDROCORTISONE (PERIANAL) 2.5 % EX CREA: 1.0000 | TOPICAL_CREAM | Freq: Two times a day (BID) | CUTANEOUS | 1 refills | Status: DC

## 2022-04-07 NOTE — Progress Notes (Signed)
Pt has limited mouth opening, and limited neck ROM. Decision was made to proceed with the colonoscopy without sedation, and schedule the EGD at a later date in the hospital setting.

## 2022-04-07 NOTE — Progress Notes (Signed)
Called to room to assist during endoscopic procedure.  Patient ID and intended procedure confirmed with present staff. Received instructions for my participation in the procedure from the performing physician.  

## 2022-04-07 NOTE — Progress Notes (Signed)
GASTROENTEROLOGY PROCEDURE H&P NOTE   Primary Care Physician: [name], FNP    Reason for Procedure:   Rectal bleeding, history of dermatomyositis  Plan:    Colonoscopy  Patient is appropriate for endoscopic procedure(s) in the ambulatory ([place]) setting.  The nature of the procedure, as well as the risks, benefits, and alternatives were carefully and thoroughly reviewed with the patient. Ample time for discussion and questions allowed. The patient understood, was satisfied, and agreed to proceed.     HPI: Patricia Krause is a 46 y.o. female who presents for colonoscopy for evaluation of rectal bleeding and history of dermatomyositis .  Patient was most recently seen in the Gastroenterology Clinic on 02/02/22.  No interval change in medical history since that appointment. Please refer to that note for full details regarding GI history and clinical presentation.   Patient was evaluated in the pre-visit area. Patient was not deemed appropriate for sedation in the [facility] due to anticipated difficult airway if intubation was required. Thus I discussed doing the procedure without sedation. The patient was agreeable to performing the colonoscopy portion of the procedure without sedation. Her EGD can be deferred to a later time with sedation in the hospital setting.  Past Medical History:  Diagnosis Date   Asthma    Dermatolysis    Hypertension    Lymphedema    dx by vascular doctor   Pre-diabetes     Past Surgical History:  Procedure Laterality Date   BREAST EXCISIONAL BIOPSY Left    BREAST SURGERY     WISDOM TOOTH EXTRACTION      Prior to Admission medications   Medication Sig Start Date End Date Taking? Authorizing Provider  amLODipine (NORVASC) 5 MG tablet Take 1 tablet (5 mg total) by mouth daily. 11/26/21  Yes [name], FNP  fluticasone (FLONASE) 50 MCG/ACT nasal spray Place 2 sprays into both nostrils daily. 11/18/21  Yes [name], FNP  folic acid (FOLVITE) 1 MG  tablet Take 2 tablets (2 mg total) by mouth daily. 12/02/21  Yes Deveshwar, Abel Presto, MD  hydrOXYzine (ATARAX) 25 MG tablet Take 1 tablet (25 mg total) by mouth 3 (three) times daily as needed. 12/15/21  Yes [name], FNP  levonorgestrel (MIRENA) 20 MCG/DAY IUD by Intrauterine route. 06/01/18  Yes [provider]  methotrexate (RHEUMATREX) 2.5 MG tablet Take 8 tablets (20 mg total) by mouth once a week. Caution:Chemotherapy. Protect from light. 01/28/22  Yes Deveshwar, Abel Presto, MD  Vitamin D, Ergocalciferol, (DRISDOL) 1.25 MG (50000 UNIT) CAPS capsule Take 1 capsule (50,000 Units total) by mouth every 7 (seven) days. 12/15/21  Yes [name], FNP  albuterol (VENTOLIN HFA) 108 (90 Base) MCG/ACT inhaler Inhale 2 puffs into the lungs every 6 (six) hours as needed for wheezing or shortness of breath. 12/15/21   [name], FNP  mupirocin ointment (BACTROBAN) 2 % Apply 1 application topically 2 (two) times daily. 11/13/21   [name], FNP  Phentermine-Topiramate (QSYMIA) 3.75-23 MG CP24 Take 1 tablet by mouth daily. Patient not taking: Reported on 04/07/2022 04/01/22   [name], FNP  Phentermine-Topiramate (QSYMIA) 7.5-46 MG CP24 Take 1 tablet by mouth daily. Patient not taking: Reported on 04/07/2022 04/01/22   [name], FNP    Current Outpatient Medications  Medication Sig Dispense Refill   amLODipine (NORVASC) 5 MG tablet Take 1 tablet (5 mg total) by mouth daily. 90 tablet 1   fluticasone (FLONASE) 50 MCG/ACT nasal spray Place 2 sprays into both nostrils daily. 18.2 mL  2   folic acid (FOLVITE) 1 MG tablet Take 2 tablets (2 mg total) by mouth daily. 180 tablet 1   hydrOXYzine (ATARAX) 25 MG tablet Take 1 tablet (25 mg total) by mouth 3 (three) times daily as needed. 270 tablet 1   levonorgestrel (MIRENA) 20 MCG/DAY IUD by Intrauterine route.     methotrexate (RHEUMATREX) 2.5 MG tablet Take 8 tablets (20 mg total) by mouth once a week. Caution:Chemotherapy. Protect from light. 32  tablet 2   Vitamin D, Ergocalciferol, (DRISDOL) 1.25 MG (50000 UNIT) CAPS capsule Take 1 capsule (50,000 Units total) by mouth every 7 (seven) days. 24 capsule 1   albuterol (VENTOLIN HFA) 108 (90 Base) MCG/ACT inhaler Inhale 2 puffs into the lungs every 6 (six) hours as needed for wheezing or shortness of breath. 8 g 2   mupirocin ointment (BACTROBAN) 2 % Apply 1 application topically 2 (two) times daily. 30 g 3   Phentermine-Topiramate (QSYMIA) 3.75-23 MG CP24 Take 1 tablet by mouth daily. (Patient not taking: Reported on 04/07/2022) 14 capsule 0   Phentermine-Topiramate (QSYMIA) 7.5-46 MG CP24 Take 1 tablet by mouth daily. (Patient not taking: Reported on 04/07/2022) 30 capsule 3   Current Facility-Administered Medications  Medication Dose Route Frequency Provider Last Rate Last Admin   0.9 %  sodium chloride infusion  500 mL Intravenous Once Sharyn Creamer, MD        Allergies as of 04/07/2022 - Review Complete 04/07/2022  Allergen Reaction Noted   Prunus persica Swelling 06/19/2015   Cat hair extract Hives and Itching 07/23/2021   Mixed ragweed Other (See Comments) 07/23/2021   Pollinex-t [modified tree tyrosine adsorbate] Other (See Comments) 07/23/2021    Family History  Problem Relation Age of Onset   Diabetes Father    Cancer Father        prostate cancer in her 65s.   Cancer Paternal Aunt        uncertain what type of cancer   Diabetes Paternal Grandmother    Alzheimer's disease Paternal Grandfather    High Cholesterol Brother    Diabetes Son    Autism Son    Colon cancer Neg Hx    Breast cancer Neg Hx     Social History   Socioeconomic History   Marital status: Married    Spouse name: Not on file   Number of children: Not on file   Years of education: Not on file   Highest education level: Not on file  Occupational History   Not on file  Tobacco Use   Smoking status: Never   Smokeless tobacco: Never  Vaping Use   Vaping Use: Never used  Substance and Sexual  Activity   Alcohol use: Yes    Comment: 1-2 drinks/months   Drug use: Never   Sexual activity: Yes    Birth control/protection: I.U.D.    Comment: Mirena 2020  Other Topics Concern   Not on file  Social History Narrative   Not on file   Social Determinants of Health   Financial Resource Strain: Not on file  Food Insecurity: Not on file  Transportation Needs: Not on file  Physical Activity: Not on file  Stress: Not on file  Social Connections: Not on file  Intimate Partner Violence: Not on file    Physical Exam: Vital signs in last 24 hours: BP 114/66 (BP Location: Right Arm, Patient Position: Sitting, Cuff Size: Normal)   Pulse 74   Temp 98 F (36.7 C) (Temporal)  Ht '5\' 8"'$  (1.727 m)   Wt 212 lb (96.2 kg)   LMP  (LMP Unknown)   SpO2 97%   BMI 32.23 kg/m  GEN: NAD EYE: Sclerae anicteric ENT: MMM CV: Non-tachycardic Pulm: No increased WOB GI: Soft NEURO:  Alert & Oriented   Christia Reading, MD [facility]   04/07/2022 3:15 PM

## 2022-04-07 NOTE — Progress Notes (Signed)
PT taken to PACU. Monitors in place. VSS. Report given to RN. 

## 2022-04-07 NOTE — Progress Notes (Signed)
Vitals-Cw  Pt's states no medical or surgical changes since previsit or office visit.  

## 2022-04-08 ENCOUNTER — Telehealth: Payer: Self-pay

## 2022-04-08 ENCOUNTER — Encounter: Payer: Self-pay | Admitting: Internal Medicine

## 2022-04-08 NOTE — Telephone Encounter (Signed)
Left message

## 2022-04-09 ENCOUNTER — Other Ambulatory Visit: Payer: Self-pay | Admitting: Nurse Practitioner

## 2022-04-09 ENCOUNTER — Ambulatory Visit (INDEPENDENT_AMBULATORY_CARE_PROVIDER_SITE_OTHER): Payer: BC Managed Care – PPO

## 2022-04-09 ENCOUNTER — Ambulatory Visit (INDEPENDENT_AMBULATORY_CARE_PROVIDER_SITE_OTHER): Payer: BC Managed Care – PPO | Admitting: Obstetrics and Gynecology

## 2022-04-09 ENCOUNTER — Encounter: Payer: Self-pay | Admitting: Obstetrics and Gynecology

## 2022-04-09 ENCOUNTER — Telehealth: Payer: Self-pay

## 2022-04-09 VITALS — BP 112/72 | HR 89

## 2022-04-09 DIAGNOSIS — D219 Benign neoplasm of connective and other soft tissue, unspecified: Secondary | ICD-10-CM

## 2022-04-09 DIAGNOSIS — N83202 Unspecified ovarian cyst, left side: Secondary | ICD-10-CM

## 2022-04-09 DIAGNOSIS — E6609 Other obesity due to excess calories: Secondary | ICD-10-CM

## 2022-04-09 MED ORDER — QSYMIA 7.5-46 MG PO CP24: 1.0000 | ORAL_CAPSULE | Freq: Every day | ORAL | 3 refills | Status: DC

## 2022-04-09 MED ORDER — QSYMIA 3.75-23 MG PO CP24: 1.0000 | ORAL_CAPSULE | Freq: Every day | ORAL | 0 refills | Status: DC

## 2022-04-09 NOTE — Telephone Encounter (Signed)
Spoke with the patient. She is unable to take the 06/04/22 date for the EGD. Case canceled.

## 2022-04-09 NOTE — Telephone Encounter (Signed)
EGD at [facility] on 06/04/22 at 7:30 am. Arrive at 6:00 am. Called the patient to discuss. No answer. Left a message asking she call me back to discuss this date.

## 2022-04-10 DIAGNOSIS — I890 Lymphedema, not elsewhere classified: Secondary | ICD-10-CM | POA: Diagnosis not present

## 2022-04-13 DIAGNOSIS — F4310 Post-traumatic stress disorder, unspecified: Secondary | ICD-10-CM | POA: Diagnosis not present

## 2022-04-13 DIAGNOSIS — F4320 Adjustment disorder, unspecified: Secondary | ICD-10-CM | POA: Diagnosis not present

## 2022-04-16 ENCOUNTER — Encounter: Payer: Self-pay | Admitting: Internal Medicine

## 2022-04-16 ENCOUNTER — Other Ambulatory Visit: Payer: Self-pay | Admitting: *Deleted

## 2022-04-16 DIAGNOSIS — M3310 Other dermatopolymyositis, organ involvement unspecified: Secondary | ICD-10-CM

## 2022-04-16 DIAGNOSIS — Z79899 Other long term (current) drug therapy: Secondary | ICD-10-CM

## 2022-04-17 LAB — COMPLETE METABOLIC PANEL WITH GFR
AG Ratio: 1.2 (calc) (ref 1.0–2.5)
ALT: 15 U/L (ref 6–29)
AST: 22 U/L (ref 10–30)
Albumin: 4.1 g/dL (ref 3.6–5.1)
Alkaline phosphatase (APISO): 46 U/L (ref 31–125)
BUN/Creatinine Ratio: 22 (calc) (ref 6–22)
BUN: 10 mg/dL (ref 7–25)
CO2: 27 mmol/L (ref 20–32)
Calcium: 9.2 mg/dL (ref 8.6–10.2)
Chloride: 103 mmol/L (ref 98–110)
Creat: 0.45 mg/dL — ABNORMAL LOW (ref 0.50–0.99)
Globulin: 3.4 g/dL (calc) (ref 1.9–3.7)
Glucose, Bld: 163 mg/dL — ABNORMAL HIGH (ref 65–99)
Potassium: 4.1 mmol/L (ref 3.5–5.3)
Sodium: 135 mmol/L (ref 135–146)
Total Bilirubin: 0.6 mg/dL (ref 0.2–1.2)
Total Protein: 7.5 g/dL (ref 6.1–8.1)
eGFR: 122 mL/min/{1.73_m2} (ref >=60)

## 2022-04-17 LAB — CBC WITH DIFFERENTIAL/PLATELET
Absolute Monocytes: 432 cells/uL (ref 200–950)
Basophils Absolute: 31 cells/uL (ref 0–200)
Basophils Relative: 0.6 %
Eosinophils Absolute: 182 cells/uL (ref 15–500)
Eosinophils Relative: 3.5 %
HCT: 35.5 % (ref 35.0–45.0)
Hemoglobin: 12.3 g/dL (ref 11.7–15.5)
Lymphs Abs: 1794 cells/uL (ref 850–3900)
MCH: 33.2 pg — ABNORMAL HIGH (ref 27.0–33.0)
MCHC: 34.6 g/dL (ref 32.0–36.0)
MCV: 95.9 fL (ref 80.0–100.0)
MPV: 9.8 fL (ref 7.5–12.5)
Monocytes Relative: 8.3 %
Neutro Abs: 2761 cells/uL (ref 1500–7800)
Neutrophils Relative %: 53.1 %
Platelets: 272 10*3/uL (ref 140–400)
RBC: 3.7 10*6/uL — ABNORMAL LOW (ref 3.80–5.10)
RDW: 12 % (ref 11.0–15.0)
Total Lymphocyte: 34.5 %
WBC: 5.2 10*3/uL (ref 3.8–10.8)

## 2022-04-17 LAB — CK: Total CK: 352 U/L — ABNORMAL HIGH (ref 29–143)

## 2022-04-17 NOTE — Progress Notes (Signed)
CBC and CMP are normal except glucose is elevated.  CK is elevated and stable.

## 2022-04-22 ENCOUNTER — Ambulatory Visit: Payer: Medicaid Other | Admitting: Rheumatology

## 2022-04-22 ENCOUNTER — Encounter: Payer: Self-pay | Admitting: Internal Medicine

## 2022-04-22 DIAGNOSIS — F4320 Adjustment disorder, unspecified: Secondary | ICD-10-CM | POA: Diagnosis not present

## 2022-04-22 DIAGNOSIS — F4310 Post-traumatic stress disorder, unspecified: Secondary | ICD-10-CM | POA: Diagnosis not present

## 2022-04-30 DIAGNOSIS — F4310 Post-traumatic stress disorder, unspecified: Secondary | ICD-10-CM | POA: Diagnosis not present

## 2022-04-30 DIAGNOSIS — F4320 Adjustment disorder, unspecified: Secondary | ICD-10-CM | POA: Diagnosis not present

## 2022-05-05 ENCOUNTER — Encounter: Payer: Self-pay | Admitting: Obstetrics and Gynecology

## 2022-05-05 ENCOUNTER — Encounter: Payer: Self-pay | Admitting: Nurse Practitioner

## 2022-05-05 NOTE — Progress Notes (Deleted)
Office Visit Note  Patient: Patricia Krause             Date of Birth: 04-01-1976           MRN: [id number]             PCP: [name], FNP Referring: [name], FNP Visit Date: 05/19/2022 Occupation: '@GUAROCC'$ @  Subjective:  No chief complaint on file.   History of Present Illness: Patricia Krause is a 46 y.o. female ***   Activities of Daily Living:  Patient reports morning stiffness for *** {minute/hour:19697}.   Patient {ACTIONS;DENIES/REPORTS:21021675::"Denies"} nocturnal pain.  Difficulty dressing/grooming: {ACTIONS;DENIES/REPORTS:21021675::"Denies"} Difficulty climbing stairs: {ACTIONS;DENIES/REPORTS:21021675::"Denies"} Difficulty getting out of chair: {ACTIONS;DENIES/REPORTS:21021675::"Denies"} Difficulty using hands for taps, buttons, cutlery, and/or writing: {ACTIONS;DENIES/REPORTS:21021675::"Denies"}  No Rheumatology ROS completed.   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   Cancer Paternal Aunt        uncertain what type of cancer   Diabetes Paternal Grandmother    Alzheimer's disease Paternal Grandfather    High Cholesterol Brother    Diabetes Son    Autism Son    Colon cancer Neg Hx    Breast cancer Neg Hx    Past Surgical History:  Procedure Laterality Date   BREAST EXCISIONAL BIOPSY Left    BREAST SURGERY     WISDOM TOOTH EXTRACTION     Social History   Social History Narrative   Not on file    Immunization History  Administered Date(s) Administered   Influenza,inj,Quad PF,6+ Mos 07/23/2021   PFIZER(Purple Top)SARS-COV-2 Vaccination 12/29/2019, 01/23/2020, 08/02/2020   Pfizer Covid-19 Vaccine Bivalent Booster 7yr & up 07/05/2021   Pneumococcal Polysaccharide-23 08/20/2021   Tdap 08/20/2021     Objective: Vital Signs: There were no vitals taken for this visit.   Physical Exam   Musculoskeletal Exam: ***  CDAI Exam: CDAI Score: -- Patient Global: --; Provider Global: -- Swollen: --; Tender: -- Joint Exam 05/19/2022   No joint exam has been documented for this visit   There is currently no information documented on the homunculus. Go to the Rheumatology activity and complete the homunculus joint exam.  Investigation: No additional findings.  Imaging: [place]vaginal Non-OB  Result Date: 04/14/2022 Indication:  Left ovarian cyst and uterine fibroids.  Mirena IUD use. Pelvic [place]terus: 7.96 x 5.9 x 5.48 cm. Fibroids, intramural and subserosal:  1.54 cm, 1.45 cm, 1.20 cm, 0.99 cm, 1.28 cm, 1.28 cm, 1.36 cm. 1.0 x 0.5 cm avascular hyperechoic area in the anterior myometrium. EMS:  not measured.  IUD in endometrial canal. Left ovary 4.95 x 3.12 x 2.38 cm.  2 simple cysts noted, 2.74 cm and 2.65 cm. Right ovary 2.54 x 1.39 x 1.36 cm. No adnexal masses. Small free fluid in left adnexa. Impression:  Multifibroid uterus.  Hyperechoic avascular area of the anterior myometrium. Two simple left  ovarian cysts.  IUD in normal position in endometrial canal.   Recent Labs: Lab Results  Component Value Date   WBC 5.2 04/16/2022   HGB 12.3 04/16/2022   PLT 272 04/16/2022   NA 135 04/16/2022   K 4.1 04/16/2022   CL 103 04/16/2022   CO2 27 04/16/2022   GLUCOSE 163 (H) 04/16/2022   BUN 10 04/16/2022   CREATININE 0.45 (L) 04/16/2022   BILITOT 0.6 04/16/2022   ALKPHOS 65 08/20/2021   AST 22 04/16/2022   ALT 15 04/16/2022   PROT 7.5 04/16/2022   ALBUMIN 4.6 08/20/2021    CALCIUM 9.2 04/16/2022   GFRAA >60 12/28/2019   QFTBGOLDPLUS NEGATIVE 11/06/2021    Speciality Comments: Dx in 2005-presented postpartum '04. Left bicep muscle bx 2005 + skin bx+. IVIG in the past.Ttd with MTX, PLQ and MMF. Off all meds since 2018  Procedures:  No procedures performed Allergies: Prunus persica, Cat hair extract, Mixed ragweed, and Pollinex-t [modified tree tyrosine adsorbate]   Assessment / Plan:     Visit Diagnoses: No diagnosis found.  Orders: No orders of the defined types were placed in this encounter.  No orders of the defined types were placed in this encounter.   Face-to-face time spent with patient was *** minutes. Greater than 50% of time was spent in counseling and coordination of care.  Follow-Up Instructions: No follow-ups on file.   Earnestine Mealing, CMA  Note - This record has been created using Editor, commissioning.  Chart creation errors have been sought, but may not always  have been located. Such creation errors do not reflect on  the standard of medical care.

## 2022-05-07 ENCOUNTER — Ambulatory Visit: Payer: Medicaid Other | Admitting: Rheumatology

## 2022-05-07 DIAGNOSIS — F4310 Post-traumatic stress disorder, unspecified: Secondary | ICD-10-CM | POA: Diagnosis not present

## 2022-05-07 DIAGNOSIS — F4320 Adjustment disorder, unspecified: Secondary | ICD-10-CM | POA: Diagnosis not present

## 2022-05-10 DIAGNOSIS — I890 Lymphedema, not elsewhere classified: Secondary | ICD-10-CM | POA: Diagnosis not present

## 2022-05-18 DIAGNOSIS — F4320 Adjustment disorder, unspecified: Secondary | ICD-10-CM | POA: Diagnosis not present

## 2022-05-18 DIAGNOSIS — F4310 Post-traumatic stress disorder, unspecified: Secondary | ICD-10-CM | POA: Diagnosis not present

## 2022-05-19 ENCOUNTER — Ambulatory Visit: Payer: BC Managed Care – PPO | Admitting: Physician Assistant

## 2022-05-19 DIAGNOSIS — R600 Localized edema: Secondary | ICD-10-CM

## 2022-05-19 DIAGNOSIS — R5383 Other fatigue: Secondary | ICD-10-CM

## 2022-05-19 DIAGNOSIS — E559 Vitamin D deficiency, unspecified: Secondary | ICD-10-CM

## 2022-05-19 DIAGNOSIS — M79642 Pain in left hand: Secondary | ICD-10-CM

## 2022-05-19 DIAGNOSIS — Z8709 Personal history of other diseases of the respiratory system: Secondary | ICD-10-CM

## 2022-05-19 DIAGNOSIS — Z79899 Other long term (current) drug therapy: Secondary | ICD-10-CM

## 2022-05-19 DIAGNOSIS — I10 Essential (primary) hypertension: Secondary | ICD-10-CM

## 2022-05-19 DIAGNOSIS — R7303 Prediabetes: Secondary | ICD-10-CM

## 2022-05-19 DIAGNOSIS — R0602 Shortness of breath: Secondary | ICD-10-CM

## 2022-05-19 DIAGNOSIS — M3310 Other dermatopolymyositis, organ involvement unspecified: Secondary | ICD-10-CM

## 2022-05-19 DIAGNOSIS — M79641 Pain in right hand: Secondary | ICD-10-CM

## 2022-05-19 DIAGNOSIS — M349 Systemic sclerosis, unspecified: Secondary | ICD-10-CM

## 2022-05-19 DIAGNOSIS — I7300 Raynaud's syndrome without gangrene: Secondary | ICD-10-CM

## 2022-05-26 ENCOUNTER — Encounter: Payer: Self-pay | Admitting: Internal Medicine

## 2022-05-26 ENCOUNTER — Telehealth: Payer: Self-pay

## 2022-05-26 NOTE — Telephone Encounter (Signed)
Patient needing an EGD in the hospital setting. Previously have offered her available dates which she declined. She requests a Monday procedure date. Called to let her know there are no Monday dates in August, September or October. Offering other dates while there are openings. Asked she respond as soon as possible.

## 2022-05-27 NOTE — Telephone Encounter (Signed)
See patient message. She asks to wait until December or January for her hospital based colonoscopy. Recall in place.

## 2022-05-28 ENCOUNTER — Other Ambulatory Visit: Payer: Self-pay | Admitting: Rheumatology

## 2022-05-28 DIAGNOSIS — M3310 Other dermatopolymyositis, organ involvement unspecified: Secondary | ICD-10-CM

## 2022-05-28 DIAGNOSIS — Z79899 Other long term (current) drug therapy: Secondary | ICD-10-CM

## 2022-05-28 DIAGNOSIS — M349 Systemic sclerosis, unspecified: Secondary | ICD-10-CM

## 2022-05-28 NOTE — Telephone Encounter (Signed)
Next Visit: 04/22/2022   Last Visit: 01/20/2022   Last Fill: 12/02/2021  DX: Adult onset dermatomyositis    Current Dose per office note 7/34/2876:  folic acid 2 mg daily  Okay to refill Folic Acid?

## 2022-06-01 ENCOUNTER — Encounter: Payer: Self-pay | Admitting: Nurse Practitioner

## 2022-06-01 ENCOUNTER — Other Ambulatory Visit: Payer: Self-pay

## 2022-06-01 MED ORDER — HYDROXYZINE HCL 25 MG PO TABS: 25.0000 mg | ORAL_TABLET | Freq: Three times a day (TID) | ORAL | 1 refills | Status: DC | PRN

## 2022-06-02 DIAGNOSIS — F4320 Adjustment disorder, unspecified: Secondary | ICD-10-CM | POA: Diagnosis not present

## 2022-06-02 DIAGNOSIS — F4310 Post-traumatic stress disorder, unspecified: Secondary | ICD-10-CM | POA: Diagnosis not present

## 2022-06-04 ENCOUNTER — Ambulatory Visit (HOSPITAL_COMMUNITY): Admit: 2022-06-04 | Payer: BC Managed Care – PPO | Admitting: Internal Medicine

## 2022-06-04 ENCOUNTER — Ambulatory Visit: Payer: BC Managed Care – PPO | Admitting: Nurse Practitioner

## 2022-06-10 ENCOUNTER — Encounter: Payer: Self-pay | Admitting: Nurse Practitioner

## 2022-06-10 DIAGNOSIS — I890 Lymphedema, not elsewhere classified: Secondary | ICD-10-CM | POA: Diagnosis not present

## 2022-06-10 DIAGNOSIS — F4310 Post-traumatic stress disorder, unspecified: Secondary | ICD-10-CM | POA: Diagnosis not present

## 2022-06-10 DIAGNOSIS — F4320 Adjustment disorder, unspecified: Secondary | ICD-10-CM | POA: Diagnosis not present

## 2022-06-11 ENCOUNTER — Other Ambulatory Visit: Payer: BC Managed Care – PPO

## 2022-06-11 ENCOUNTER — Other Ambulatory Visit: Payer: BC Managed Care – PPO | Admitting: Obstetrics and Gynecology

## 2022-06-12 ENCOUNTER — Encounter: Payer: Self-pay | Admitting: Nurse Practitioner

## 2022-06-16 ENCOUNTER — Encounter: Payer: Self-pay | Admitting: Rheumatology

## 2022-06-16 ENCOUNTER — Encounter: Payer: Self-pay | Admitting: Obstetrics and Gynecology

## 2022-06-19 DIAGNOSIS — F4320 Adjustment disorder, unspecified: Secondary | ICD-10-CM | POA: Diagnosis not present

## 2022-06-19 DIAGNOSIS — F4310 Post-traumatic stress disorder, unspecified: Secondary | ICD-10-CM | POA: Diagnosis not present

## 2022-06-19 NOTE — Progress Notes (Unsigned)
Office Visit Note  Patient: Patricia Krause             Date of Birth: [birth year]           MRN: [id number]             PCP: [name], FNP Referring: [name], FNP Visit Date: 06/23/2022 Occupation: '@GUAROCC'$ @  Subjective:  No chief complaint on file.   History of Present Illness: Patricia Krause is a 46 y.o. female ***   Activities of Daily Living:  Patient reports morning stiffness for *** {minute/hour:19697}.   Patient {ACTIONS;DENIES/REPORTS:21021675::"Denies"} nocturnal pain.  Difficulty dressing/grooming: {ACTIONS;DENIES/REPORTS:21021675::"Denies"} Difficulty climbing stairs: {ACTIONS;DENIES/REPORTS:21021675::"Denies"} Difficulty getting out of chair: {ACTIONS;DENIES/REPORTS:21021675::"Denies"} Difficulty using hands for taps, buttons, cutlery, and/or writing: {ACTIONS;DENIES/REPORTS:21021675::"Denies"}  No Rheumatology ROS completed.   PMFS History:  Patient Active Problem List   Diagnosis Date Noted  . 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 Raynaud's syndrome without gangrene 12/02/2021  . History of asthma 11/06/2021  . Vitamin D deficiency 11/06/2021  . Prediabetes 11/06/2021  . Essential hypertension 11/06/2021  . Hypokalemia 08/20/2021    Past Medical History:  Diagnosis Date  . Asthma   . Dermatolysis   . Hypertension   . Lymphedema    dx by vascular doctor  . Pre-diabetes     Family History  Problem Relation Age of Onset  . Diabetes Father   . Cancer Father        prostate cancer in her 64s.  . Cancer Paternal Aunt        uncertain what type of cancer  . Diabetes Paternal Grandmother   . Alzheimer's disease Paternal Grandfather   . High Cholesterol Brother   . Diabetes Son   . Autism Son   . Colon cancer Neg Hx   . Breast cancer Neg Hx    Past Surgical History:  Procedure Laterality Date  . BREAST EXCISIONAL BIOPSY Left   . BREAST SURGERY    . WISDOM TOOTH EXTRACTION     Social History   Social History Narrative   . Not on file   Immunization History  Administered Date(s) Administered  . Influenza,inj,Quad PF,6+ Mos 07/23/2021  . PFIZER(Purple Top)SARS-COV-2 Vaccination 12/29/2019, 01/23/2020, 08/02/2020  . Pension scheme manager 57yr & up 07/05/2021  . Pneumococcal Polysaccharide-23 08/20/2021  . Tdap 08/20/2021     Objective: Vital Signs: There were no vitals taken for this visit.   Physical Exam   Musculoskeletal Exam: ***  CDAI Exam: CDAI Score: -- Patient Global: --; Provider Global: -- Swollen: --; Tender: -- Joint Exam 06/23/2022   No joint exam has been documented for this visit   There is currently no information documented on the homunculus. Go to the Rheumatology activity and complete the homunculus joint exam.  Investigation: No additional findings.  Imaging: No results found.  Recent Labs: Lab Results  Component Value Date   WBC 5.2 04/16/2022   HGB 12.3 04/16/2022   PLT 272 04/16/2022   NA 135 04/16/2022   K 4.1 04/16/2022   CL 103 04/16/2022   CO2 27 04/16/2022   GLUCOSE 163 (H) 04/16/2022   BUN 10 04/16/2022   CREATININE 0.45 (L) 04/16/2022   BILITOT 0.6 04/16/2022   ALKPHOS 65 08/20/2021   AST 22 04/16/2022   ALT 15 04/16/2022   PROT 7.5 04/16/2022   ALBUMIN 4.6 08/20/2021   CALCIUM 9.2 04/16/2022   GFRAA >60 12/28/2019   QFTBGOLDPLUS NEGATIVE 11/06/2021    Speciality Comments: Dx in 2005-presented  postpartum '04. Left bicep muscle bx 2005 + skin bx+. IVIG in the past.Ttd with MTX, PLQ and MMF. Off all meds since 2018  Procedures:  No procedures performed Allergies: Prunus persica, Cat hair extract, Mixed ragweed, and Pollinex-t [modified tree tyrosine adsorbate]   Assessment / Plan:     Visit Diagnoses: No diagnosis found.  Orders: No orders of the defined types were placed in this encounter.  No orders of the defined types were placed in this encounter.   Face-to-face time spent with patient was *** minutes.  Greater than 50% of time was spent in counseling and coordination of care.  Follow-Up Instructions: No follow-ups on file.   Earnestine Mealing, CMA  Note - This record has been created using Editor, commissioning.  Chart creation errors have been sought, but may not always  have been located. Such creation errors do not reflect on  the standard of medical care.

## 2022-06-23 ENCOUNTER — Encounter: Payer: Self-pay | Admitting: Physician Assistant

## 2022-06-23 ENCOUNTER — Ambulatory Visit: Payer: BC Managed Care – PPO | Attending: Rheumatology | Admitting: Physician Assistant

## 2022-06-23 VITALS — BP 129/74 | HR 80 | Resp 17 | Ht 68.0 in | Wt 207.2 lb

## 2022-06-23 DIAGNOSIS — M79641 Pain in right hand: Secondary | ICD-10-CM

## 2022-06-23 DIAGNOSIS — M349 Systemic sclerosis, unspecified: Secondary | ICD-10-CM

## 2022-06-23 DIAGNOSIS — Z8709 Personal history of other diseases of the respiratory system: Secondary | ICD-10-CM

## 2022-06-23 DIAGNOSIS — E559 Vitamin D deficiency, unspecified: Secondary | ICD-10-CM

## 2022-06-23 DIAGNOSIS — R7303 Prediabetes: Secondary | ICD-10-CM

## 2022-06-23 DIAGNOSIS — I10 Essential (primary) hypertension: Secondary | ICD-10-CM

## 2022-06-23 DIAGNOSIS — Z79899 Other long term (current) drug therapy: Secondary | ICD-10-CM | POA: Diagnosis not present

## 2022-06-23 DIAGNOSIS — I7300 Raynaud's syndrome without gangrene: Secondary | ICD-10-CM

## 2022-06-23 DIAGNOSIS — M3310 Other dermatopolymyositis, organ involvement unspecified: Secondary | ICD-10-CM

## 2022-06-23 DIAGNOSIS — R5383 Other fatigue: Secondary | ICD-10-CM

## 2022-06-23 DIAGNOSIS — R0602 Shortness of breath: Secondary | ICD-10-CM

## 2022-06-23 DIAGNOSIS — M79642 Pain in left hand: Secondary | ICD-10-CM

## 2022-06-23 DIAGNOSIS — R600 Localized edema: Secondary | ICD-10-CM

## 2022-06-23 MED ORDER — METHOTREXATE 2.5 MG PO TABS: 20.0000 mg | ORAL_TABLET | ORAL | 0 refills | Status: DC

## 2022-06-23 NOTE — Patient Instructions (Signed)
Standing Labs We placed an order today for your standing lab work.   Please have your standing labs drawn in October and every 3 months    If possible, please have your labs drawn 2 weeks prior to your appointment so that the provider can discuss your results at your appointment.  Please note that you may see your imaging and lab results in MyChart before we have reviewed them. We may be awaiting multiple results to interpret others before contacting you. Please allow our office up to 72 hours to thoroughly review all of the results before contacting the office for clarification of your results.  We currently have open lab daily: Monday through Thursday from 1:30 PM-4:30 PM and Friday from 1:30 PM- 4:00 PM If possible, please come for your lab work on Monday, Thursday or Friday afternoons, as you may experience shorter wait times.   Effective August 12, 2022 the new lab hours will change to: Monday through Thursday from 1:30 PM-5:00 PM and Friday from 8:30 AM-12:00 PM If possible, please come for your lab work on Monday and Thursday afternoons, as you may experience shorter wait times.  Please be advised, all patients with office appointments requiring lab work will take precedent over walk-in lab work.    The office is located at [address], [address], [place], [place] [address] No appointment is necessary.   Labs are drawn by [facility]. Please bring your co-pay at the time of your lab draw.  You may receive a bill from [facility] for your lab work.  Please note if you are on Hydroxychloroquine and and an order has been placed for a Hydroxychloroquine level, you will need to have it drawn 4 hours or more after your last dose.  If you wish to have your labs drawn at another location, please call the office 24 hours in advance to send orders.  If you have any questions regarding directions or hours of operation,  please call [phone].   As a reminder, please drink plenty of water prior  to coming for your lab work. Thanks! .  If you have signs or symptoms of an infection or start antibiotics: First, call your PCP for workup of your infection. Hold your medication through the infection, until you complete your antibiotics, and until symptoms resolve if you take the following: Injectable medication (Actemra, Benlysta, Cimzia, Cosentyx, Enbrel, Humira, Kevzara, Orencia, Remicade, Simponi, Stelara, Taltz, Tremfya) Methotrexate Leflunomide (Arava) Mycophenolate (Cellcept) Xeljanz, Olumiant, or Rinvoq Vaccines You are taking a medication(s) that can suppress your immune system.  The following immunizations are recommended: Flu annually Covid-19  Td/Tdap (tetanus, diphtheria, pertussis) every 10 years Pneumonia (Prevnar 15 then Pneumovax 23 at least 1 year apart.  Alternatively, can take Prevnar 20 without needing additional dose) Shingrix: 2 doses from 4 weeks to 6 months apart  Please check with your PCP to make sure you are up to date.   

## 2022-06-26 DIAGNOSIS — F4320 Adjustment disorder, unspecified: Secondary | ICD-10-CM | POA: Diagnosis not present

## 2022-06-26 DIAGNOSIS — F4310 Post-traumatic stress disorder, unspecified: Secondary | ICD-10-CM | POA: Diagnosis not present

## 2022-06-29 DIAGNOSIS — F4320 Adjustment disorder, unspecified: Secondary | ICD-10-CM | POA: Diagnosis not present

## 2022-06-29 DIAGNOSIS — F4310 Post-traumatic stress disorder, unspecified: Secondary | ICD-10-CM | POA: Diagnosis not present

## 2022-07-02 DIAGNOSIS — M3390 Dermatopolymyositis, unspecified, organ involvement unspecified: Secondary | ICD-10-CM | POA: Diagnosis not present

## 2022-07-02 DIAGNOSIS — M340 Progressive systemic sclerosis: Secondary | ICD-10-CM | POA: Diagnosis not present

## 2022-07-02 DIAGNOSIS — D2371 Other benign neoplasm of skin of right lower limb, including hip: Secondary | ICD-10-CM | POA: Diagnosis not present

## 2022-07-06 ENCOUNTER — Ambulatory Visit: Payer: BC Managed Care – PPO | Admitting: Nurse Practitioner

## 2022-07-07 ENCOUNTER — Ambulatory Visit: Payer: BC Managed Care – PPO | Admitting: Nurse Practitioner

## 2022-07-07 DIAGNOSIS — F4320 Adjustment disorder, unspecified: Secondary | ICD-10-CM | POA: Diagnosis not present

## 2022-07-07 DIAGNOSIS — F4310 Post-traumatic stress disorder, unspecified: Secondary | ICD-10-CM | POA: Diagnosis not present

## 2022-07-09 ENCOUNTER — Other Ambulatory Visit: Payer: BC Managed Care – PPO

## 2022-07-09 ENCOUNTER — Other Ambulatory Visit: Payer: BC Managed Care – PPO | Admitting: Obstetrics and Gynecology

## 2022-07-14 ENCOUNTER — Ambulatory Visit: Payer: Medicaid Other | Admitting: Physician Assistant

## 2022-07-21 DIAGNOSIS — F4310 Post-traumatic stress disorder, unspecified: Secondary | ICD-10-CM | POA: Diagnosis not present

## 2022-07-21 DIAGNOSIS — F4320 Adjustment disorder, unspecified: Secondary | ICD-10-CM | POA: Diagnosis not present

## 2022-07-29 DIAGNOSIS — F4310 Post-traumatic stress disorder, unspecified: Secondary | ICD-10-CM | POA: Diagnosis not present

## 2022-07-29 DIAGNOSIS — F4320 Adjustment disorder, unspecified: Secondary | ICD-10-CM | POA: Diagnosis not present

## 2022-07-30 ENCOUNTER — Other Ambulatory Visit: Payer: BC Managed Care – PPO | Admitting: Obstetrics and Gynecology

## 2022-07-30 ENCOUNTER — Other Ambulatory Visit: Payer: BC Managed Care – PPO

## 2022-08-04 ENCOUNTER — Encounter: Payer: Self-pay | Admitting: Obstetrics and Gynecology

## 2022-08-20 ENCOUNTER — Ambulatory Visit (INDEPENDENT_AMBULATORY_CARE_PROVIDER_SITE_OTHER): Payer: BC Managed Care – PPO

## 2022-08-20 ENCOUNTER — Encounter: Payer: Self-pay | Admitting: Obstetrics and Gynecology

## 2022-08-20 ENCOUNTER — Ambulatory Visit (INDEPENDENT_AMBULATORY_CARE_PROVIDER_SITE_OTHER): Payer: BC Managed Care – PPO | Admitting: Obstetrics and Gynecology

## 2022-08-20 ENCOUNTER — Other Ambulatory Visit: Payer: Self-pay | Admitting: *Deleted

## 2022-08-20 ENCOUNTER — Other Ambulatory Visit (HOSPITAL_COMMUNITY)
Admission: RE | Admit: 2022-08-20 | Discharge: 2022-08-20 | Disposition: A | Discharge status: Home / Self Care | Payer: BC Managed Care – PPO | Source: Ambulatory Visit | Attending: Obstetrics and Gynecology | Admitting: Obstetrics and Gynecology

## 2022-08-20 VITALS — BP 122/78 | HR 80 | Ht 68.0 in | Wt 206.0 lb

## 2022-08-20 DIAGNOSIS — F4320 Adjustment disorder, unspecified: Secondary | ICD-10-CM | POA: Diagnosis not present

## 2022-08-20 DIAGNOSIS — D219 Benign neoplasm of connective and other soft tissue, unspecified: Secondary | ICD-10-CM

## 2022-08-20 DIAGNOSIS — Z124 Encounter for screening for malignant neoplasm of cervix: Secondary | ICD-10-CM | POA: Insufficient documentation

## 2022-08-20 DIAGNOSIS — F4310 Post-traumatic stress disorder, unspecified: Secondary | ICD-10-CM | POA: Diagnosis not present

## 2022-08-20 DIAGNOSIS — N83202 Unspecified ovarian cyst, left side: Secondary | ICD-10-CM

## 2022-08-20 DIAGNOSIS — N83209 Unspecified ovarian cyst, unspecified side: Secondary | ICD-10-CM | POA: Diagnosis not present

## 2022-08-20 DIAGNOSIS — Z9189 Other specified personal risk factors, not elsewhere classified: Secondary | ICD-10-CM

## 2022-08-20 NOTE — Progress Notes (Signed)
GYNECOLOGY  VISIT   HPI: 46 y.o.   Married  [place] American  female   G1P1 with No LMP recorded. (Menstrual status: IUD).   here to discuss ultrasound, pap smear.    Patient seen initially on 01/26/22 for a 3.6 cm left ovarian cyst noted on CT scan.   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  She had a pelvic US on 04/09/22 in office: Uterus: 7.96 x 5.9 x 5.48 cm.  Fibroids, intramural and subserosal:  1.54 cm, 1.45 cm, 1.20 cm, 0.99 cm, 1.28 cm, 1.28 cm, 1.36 cm.  1.0 x 0.5 cm avascular hyperechoic area in the anterior myometrium. EMS:  not measured.  IUD in endometrial canal.  Left ovary 4.95 x 3.12 x 2.38 cm.  2 simple cysts noted, 2.74 cm and 2.65 cm.  Right ovary 2.54 x 1.39 x 1.36 cm.  No adnexal masses.  Small free fluid in left adnexa.   See will see her PCP for a routine exam on Nov. 28, 2023.   GYNECOLOGIC HISTORY: No LMP recorded. (Menstrual status: IUD). Contraception:  iud, Mirena placed in 06/01/2018. Menopausal hormone therapy:  N/A Last mammogram:   02/12/22-neg birads 1  Last pap smear:   5-6 years ago-WNL per pt in [place].           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[place]) 12/02/2021   Raynaud's syndrome without gangrene 12/02/2021   History of asthma 11/06/2021   Vitamin D deficiency 11/06/2021   Prediabetes 11/06/2021   Essential hypertension 11/06/2021   Hypokalemia 08/20/2021    Past Medical History:  Diagnosis Date   Asthma    Dermatolysis    Hypertension    Lymphedema    dx by vascular doctor   Pre-diabetes     Past Surgical History:  Procedure Laterality Date   BREAST EXCISIONAL BIOPSY Left    BREAST SURGERY     COLONOSCOPY  03/2022   WISDOM TOOTH EXTRACTION      Current  Outpatient Medications  Medication Sig Dispense Refill   albuterol (VENTOLIN HFA) 108 (90 Base) MCG/ACT inhaler Inhale 2 puffs into the lungs every 6 (six) hours as needed for wheezing or shortness of breath. 8 g 2   amLODipine (NORVASC) 5 MG tablet Take 1 tablet (5 mg total) by mouth daily. 90 tablet 1   fluticasone (FLONASE) 50 MCG/ACT nasal spray Place 2 sprays into both nostrils daily. 03.5 mL 2   folic acid (FOLVITE) 1 MG tablet TAKE 2 TABLETS(2 MG) BY MOUTH DAILY 180 tablet 1   hydrocortisone (ANUSOL-HC) 2.5 % rectal cream Place 1 Application rectally 2 (two) times daily. 30 g 1   hydrOXYzine (ATARAX) 25 MG tablet Take 1 tablet (25 mg total) by mouth 3 (three) times daily as needed. 270 tablet 1   methotrexate (RHEUMATREX) 2.5 MG tablet Take 8 tablets (20 mg total) by mouth once a week. Caution:Chemotherapy. Protect from light. 96 tablet 0   mupirocin  ointment (BACTROBAN) 2 % Apply 1 application topically 2 (two) times daily. 30 g 3   Phentermine-Topiramate (QSYMIA) 3.75-23 MG CP24 Take 1 tablet by mouth daily. 14 capsule 0   Phentermine-Topiramate (QSYMIA) 7.5-46 MG CP24 Take 1 tablet by mouth daily. 30 capsule 3   Vitamin D, Ergocalciferol, (DRISDOL) 1.25 MG (50000 UNIT) CAPS capsule Take 1 capsule (50,000 Units total) by mouth every 7 (seven) days. 24 capsule 1   No current facility-administered medications for this visit.     ALLERGIES: Prunus persica, Cat hair extract, Mixed ragweed, and Pollinex-t [modified tree tyrosine adsorbate]  Family History  Problem Relation Age of Onset   Diabetes Father    Cancer Father        prostate cancer in her 8s.   Cancer Paternal Aunt        uncertain what type of cancer   Diabetes Paternal Grandmother    Alzheimer's disease Paternal Grandfather    High Cholesterol Brother    Diabetes Son    Autism Son    Colon cancer Neg Hx    Breast cancer Neg Hx     Social History   Socioeconomic History   Marital status: Married    Spouse name:  Not on file   Number of children: Not on file   Years of education: Not on file   Highest education level: Not on file  Occupational History   Not on file  Tobacco Use   Smoking status: Never    Passive exposure: Never   Smokeless tobacco: Never  Vaping Use   Vaping Use: Never used  Substance and Sexual Activity   Alcohol use: Yes    Comment: 1-2 drinks/months   Drug use: Never   Sexual activity: Yes    Birth control/protection: I.U.D.    Comment: Mirena 2020  Other Topics Concern   Not on file  Social History Narrative   Not on file   Social Determinants of Health   Financial Resource Strain: Not on file  Food Insecurity: Not on file  Transportation Needs: Not on file  Physical Activity: Not on file  Stress: Not on file  Social Connections: Not on file  Intimate Partner Violence: Not on file    Review of Systems  All other systems reviewed and are negative.   PHYSICAL EXAMINATION:    BP 122/78 (BP Location: Right Arm, Patient Position: Sitting, Cuff Size: Normal)   Pulse 80   Ht '5\' 8"'$  (1.727 m)   Wt 206 lb (93.4 kg)   BMI 31.32 kg/m     General appearance: alert, cooperative and appears stated age   Pelvic: External genitalia:  no lesions              Urethra:  normal appearing urethra with no masses, tenderness or lesions              Bartholins and Skenes: normal                 Vagina: normal appearing vagina with normal color and discharge, no lesions              Cervix: no lesions.  IUD string noted.                 Bimanual Exam:  Uterus:  normal size, contour, position, consistency, mobility, non-tender.  Exam limited by involuntary guarding.               Adnexa: no mass, fullness, tenderness  Chaperone was present for exam:  Kimalexis  Pelvic US  Uterus 8.14 x 5.65 x 4.8 cm.  Multiple fibroids:  1.88 cm, 1.61 cm,1.59 cm, 1.49 cm, 2.30 cm.  EMS 4.92 mm. IUD in normal position in endometrial canal.  Left ovary 3.47 x 2.27 x 2.30 cm.   Follicle noted.  No cyst formation.  Right ovary 4.88 x 4.16 x 3.29 cm.  3.19 cm simple right ovarian cyst. No free fluid.  ASSESSMENT  Mirena IUD.  Left ovarian cysts resolved.  Simple right ovarian cyst. Fibroids.  Stable. 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   PLAN  [place] images and report reviewed. Pap and HR HPV collected. Fu for yearly annual exam and yearly pelvic US following this.  Signs and symptoms of ovarian cancer reviewed.    An After Visit Summary was printed and given to the patient.  20 min  total time was spent for this patient encounter, including preparation, face-to-face counseling with the patient, coordination of care, and documentation of the encounter.

## 2022-08-24 ENCOUNTER — Other Ambulatory Visit: Payer: Self-pay

## 2022-08-24 ENCOUNTER — Encounter: Payer: Self-pay | Admitting: Nurse Practitioner

## 2022-08-24 DIAGNOSIS — F4310 Post-traumatic stress disorder, unspecified: Secondary | ICD-10-CM | POA: Diagnosis not present

## 2022-08-24 DIAGNOSIS — F4320 Adjustment disorder, unspecified: Secondary | ICD-10-CM | POA: Diagnosis not present

## 2022-08-24 LAB — CYTOLOGY - PAP
Comment: NEGATIVE
Diagnosis: NEGATIVE
Diagnosis: REACTIVE
High risk HPV: NEGATIVE

## 2022-08-24 MED ORDER — AMLODIPINE BESYLATE 5 MG PO TABS: 5.0000 mg | ORAL_TABLET | Freq: Every day | ORAL | 1 refills | Status: DC

## 2022-09-07 ENCOUNTER — Encounter: Payer: BC Managed Care – PPO | Admitting: Nurse Practitioner

## 2022-09-08 ENCOUNTER — Other Ambulatory Visit: Payer: Self-pay | Admitting: Nurse Practitioner

## 2022-09-08 ENCOUNTER — Encounter: Payer: Self-pay | Admitting: Nurse Practitioner

## 2022-09-08 ENCOUNTER — Ambulatory Visit (INDEPENDENT_AMBULATORY_CARE_PROVIDER_SITE_OTHER): Payer: BC Managed Care – PPO | Admitting: Nurse Practitioner

## 2022-09-08 VITALS — BP 122/88 | HR 84 | Temp 98.0°F | Ht 68.0 in | Wt 211.2 lb

## 2022-09-08 DIAGNOSIS — L930 Discoid lupus erythematosus: Secondary | ICD-10-CM

## 2022-09-08 DIAGNOSIS — I10 Essential (primary) hypertension: Secondary | ICD-10-CM | POA: Diagnosis not present

## 2022-09-08 DIAGNOSIS — Z0000 Encounter for general adult medical examination without abnormal findings: Secondary | ICD-10-CM

## 2022-09-08 DIAGNOSIS — Z79899 Other long term (current) drug therapy: Secondary | ICD-10-CM | POA: Diagnosis not present

## 2022-09-08 DIAGNOSIS — E6609 Other obesity due to excess calories: Secondary | ICD-10-CM | POA: Diagnosis not present

## 2022-09-08 DIAGNOSIS — E559 Vitamin D deficiency, unspecified: Secondary | ICD-10-CM | POA: Diagnosis not present

## 2022-09-08 DIAGNOSIS — I890 Lymphedema, not elsewhere classified: Secondary | ICD-10-CM | POA: Diagnosis not present

## 2022-09-08 DIAGNOSIS — M3310 Other dermatopolymyositis, organ involvement unspecified: Secondary | ICD-10-CM

## 2022-09-08 DIAGNOSIS — Z23 Encounter for immunization: Secondary | ICD-10-CM | POA: Diagnosis not present

## 2022-09-08 DIAGNOSIS — E88819 Insulin resistance, unspecified: Secondary | ICD-10-CM | POA: Diagnosis not present

## 2022-09-08 DIAGNOSIS — L669 Cicatricial alopecia, unspecified: Secondary | ICD-10-CM

## 2022-09-08 DIAGNOSIS — R7303 Prediabetes: Secondary | ICD-10-CM | POA: Diagnosis not present

## 2022-09-08 DIAGNOSIS — Z6832 Body mass index (BMI) 32.0-32.9, adult: Secondary | ICD-10-CM

## 2022-09-08 LAB — POCT URINALYSIS DIPSTICK
Bilirubin, UA: NEGATIVE
Blood, UA: NEGATIVE
Glucose, UA: NEGATIVE
Ketones, UA: NEGATIVE
Leukocytes, UA: NEGATIVE
Nitrite, UA: NEGATIVE
Protein, UA: NEGATIVE
Spec Grav, UA: 1.01 (ref 1.010–1.025)
Urobilinogen, UA: 0.2 E.U./dL
pH, UA: 7 (ref 5.0–8.0)

## 2022-09-08 MED ORDER — QSYMIA 11.25-69 MG PO CP24: 1.0000 | ORAL_CAPSULE | Freq: Every day | ORAL | 1 refills | Status: DC

## 2022-09-08 MED ORDER — QSYMIA 11.25-69 MG PO CP24: 1.0000 | ORAL_CAPSULE | Freq: Every day | ORAL | 0 refills | Status: DC

## 2022-09-08 NOTE — Progress Notes (Signed)
I,[name],acting as a Education administrator for [name], FNP.,have documented all relevant documentation on the behalf of [name], FNP,as directed by  [name], FNP while in the presence of [name], [place].   Subjective:     Patient ID: Patricia Krause , female    DOB: Jan 07, 1976 , 46 y.o.   MRN: [id number]   No chief complaint on file.   HPI  HM.  Patient currently has GYN, pap completed on 08/20/2022. Dr. Quincy Simmonds. She is to have an endoscopy but has been having job changes so she has had to postpone several times. They will be doing procedure at the hospital. She is not in a position to go to the lymphedema clinic. She does have a compression pump.    Wt Readings from Last 3 Encounters: 09/08/22 : 211 lb 3.2 oz (95.8 kg) 08/20/22 : 206 lb (93.4 kg) 06/23/22 : 207 lb 3.2 oz (94 kg)       Past Medical History:  Diagnosis Date   Asthma    Dermatolysis    Hypertension    Lymphedema    dx by vascular doctor   Pre-diabetes      Family History  Problem Relation Age of Onset   Diabetes Father    Cancer Father        prostate cancer in her 109s.   Cancer Paternal Aunt        uncertain what type of cancer   Diabetes Paternal Grandmother    Alzheimer's disease Paternal Grandfather    High Cholesterol Brother    Diabetes Son    Autism Son    Colon cancer Neg Hx    Breast cancer Neg Hx      Current Outpatient Medications:    albuterol (VENTOLIN HFA) 108 (90 Base) MCG/ACT inhaler, Inhale 2 puffs into the lungs every 6 (six) hours as needed for wheezing or shortness of breath., Disp: 8 g, Rfl: 2   amLODipine (NORVASC) 5 MG tablet, Take 1 tablet (5 mg total) by mouth daily., Disp: 90 tablet, Rfl: 1   fluticasone (FLONASE) 50 MCG/ACT nasal spray, Place 2 sprays into both nostrils daily., Disp: 94.8 mL, Rfl: 2   folic acid (FOLVITE) 1 MG tablet, TAKE 2 TABLETS(2 MG) BY MOUTH DAILY, Disp: 180 tablet, Rfl: 1   hydrocortisone (ANUSOL-HC) 2.5 % rectal cream, Place 1  Application rectally 2 (two) times daily., Disp: 30 g, Rfl: 1   hydrOXYzine (ATARAX) 25 MG tablet, Take 1 tablet (25 mg total) by mouth 3 (three) times daily as needed., Disp: 270 tablet, Rfl: 1   methotrexate (RHEUMATREX) 2.5 MG tablet, Take 8 tablets (20 mg total) by mouth once a week. Caution:Chemotherapy. Protect from light., Disp: 96 tablet, Rfl: 0   mupirocin ointment (BACTROBAN) 2 %, Apply 1 application topically 2 (two) times daily., Disp: 30 g, Rfl: 3   Vitamin D, Ergocalciferol, (DRISDOL) 1.25 MG (50000 UNIT) CAPS capsule, Take 1 capsule (50,000 Units total) by mouth every 7 (seven) days., Disp: 24 capsule, Rfl: 1   Phentermine-Topiramate (QSYMIA) 11.25-69 MG CP24, Take 1 tablet by mouth daily., Disp: 90 capsule, Rfl: 0   Allergies  Allergen Reactions   Prunus Persica Swelling    cheeks cheeks cheeks    Cat Hair Extract Hives and Itching   Mixed Ragweed Other (See Comments)   Pollinex-T [Modified Tree Tyrosine Adsorbate] Other (See Comments)      The patient states she has an  IUD for birth control. She had a pelvic ultrasound  and seen the IUD at that time. She has cyst but are okay per patient. She is due to have a repeat ultrasound in one year. No LMP recorded. (Menstrual status: IUD).  Negative for Dysmenorrhea and Negative for Menorrhagia. Negative for: breast discharge, breast lump(s), breast pain and breast self exam. Associated symptoms include abnormal vaginal bleeding. Pertinent negatives include abnormal bleeding (hematology), anxiety, decreased libido, depression, difficulty falling sleep, dyspareunia, history of infertility, nocturia, sexual dysfunction, sleep disturbances, urinary incontinence, urinary urgency, vaginal discharge and vaginal itching. Diet regular.  The patient states her exercise level is minimal - with youtube videos with walking and Zumba.   The patient's tobacco use is:  Social History   Tobacco Use  Smoking Status Never   Passive exposure: Never   Smokeless Tobacco Never   She has been exposed to passive smoke. The patient's alcohol use is:  Social History   Substance and Sexual Activity  Alcohol Use Yes   Comment: 1-2 drinks/months  Additional information: Last pap 08/20/2022 per patient with Dr. Quincy Simmonds, next one scheduled for 2026.    Review of Systems  Constitutional: Negative.   HENT: Negative.    Eyes: Negative.   Respiratory: Negative.    Cardiovascular: Negative.   Gastrointestinal: Negative.   Endocrine: Negative.   Genitourinary: Negative.   Musculoskeletal: Negative.   Skin: Negative.        Has dermatosiasitis  Allergic/Immunologic: Negative.   Neurological: Negative.   Hematological: Negative.   Psychiatric/Behavioral: Negative.       Today's Vitals   09/08/22 1414  BP: 122/88  Pulse: 84  Temp: 98 F (36.7 C)  SpO2: 96%  Weight: 211 lb 3.2 oz (95.8 kg)  Height: _0  (1.727 m)   Body mass index is 32.11 kg/m.   Objective:  Physical Exam Vitals reviewed.  Constitutional:      General: She is not in acute distress.    Appearance: Normal appearance. She is well-developed. She is obese.  HENT:     Head: Normocephalic and atraumatic.     Right Ear: Hearing, tympanic membrane, ear canal and external ear normal. There is no impacted cerumen.     Left Ear: Hearing, tympanic membrane, ear canal and external ear normal. There is no impacted cerumen.     Nose:     Comments: Deferred - masked    Mouth/Throat:     Comments: Deferred - masked Eyes:     General: Lids are normal.     Extraocular Movements: Extraocular movements intact.     Conjunctiva/sclera: Conjunctivae normal.     Pupils: Pupils are equal, round, and reactive to light.     Funduscopic exam:    Right eye: No papilledema.        Left eye: No papilledema.  Neck:     Thyroid: No thyroid mass.     Vascular: No carotid bruit.  Cardiovascular:     Rate and Rhythm: Normal rate and regular rhythm.     Pulses: Normal pulses.     Heart  sounds: Normal heart sounds. No murmur heard. Pulmonary:     Effort: Pulmonary effort is normal. No respiratory distress.     Breath sounds: Normal breath sounds. No wheezing.  Chest:     Chest wall: No mass.  Breasts:    Tanner Score is 5.     Right: Normal. No mass or tenderness.     Left: Normal. No mass or tenderness.  Abdominal:     General: Abdomen is flat.  Bowel sounds are normal. There is no distension.     Palpations: Abdomen is soft.     Tenderness: There is no abdominal tenderness.  Genitourinary:    Rectum: Guaiac result negative.  Musculoskeletal:        General: No swelling. Normal range of motion.     Cervical back: Full passive range of motion without pain, normal range of motion and neck supple.     Right lower leg: Edema (lymphedema) present.     Left lower leg: Edema (lymphedema) present.  Lymphadenopathy:     Upper Body:     Right upper body: No supraclavicular, axillary or pectoral adenopathy.     Left upper body: No supraclavicular, axillary or pectoral adenopathy.  Skin:    General: Skin is warm and dry.     Capillary Refill: Capillary refill takes less than 2 seconds.  Neurological:     General: No focal deficit present.     Mental Status: She is alert and oriented to person, place, and time.     Cranial Nerves: No cranial nerve deficit.     Sensory: No sensory deficit.     Motor: No weakness.  Psychiatric:        Mood and Affect: Mood normal.        Behavior: Behavior normal.        Thought Content: Thought content normal.        Judgment: Judgment normal.         Assessment And Plan:     1. Encounter for general health examination Behavior modifications discussed and diet history reviewed.   Pt will continue to exercise regularly and modify diet with low GI, plant based foods and decrease intake of processed foods.  Recommend intake of daily multivitamin, Vitamin D, and calcium.  Recommend mammogram and colonoscopy for preventive screenings,  as well as recommend immunizations that include influenza, TDAP, and Shingles  2. Class 1 obesity due to excess calories without serious comorbidity with body mass index (BMI) of 32.0 to 32.9 in adult Comments: Will increase Qsymia she is getting from local pharmacy - Lipid panel - Phentermine-Topiramate (QSYMIA) 11.25-69 MG CP24; Take 1 tablet by mouth daily.  Dispense: 90 capsule; Refill: 0  3. Essential hypertension Comments: Blood pressure is fairly controlled, encouraged to limit intake of high salt foods.  EKG done with left atrial enlargement.  4. Prediabetes Comments: She is unable to tolerate metformin. Insurance did not cover rybelsus due to not having diabetes. - POCT Urinalysis Dipstick (81002) - Microalbumin / creatinine urine ratio - EKG 12-Lead - Flu Vaccine QUAD 6+ mos PF IM (Fluarix Quad PF) - Hemoglobin A1c - CMP14+EGFR  5. Insulin resistance - Hemoglobin A1c - CMP14+EGFR - Insulin, random  6. Lymphedema Comments: Discussed how this can affect her weight being elevated.  7. Other long term (current) drug therapy - CBC  8. Vitamin D deficiency - VITAMIN D 25 Hydroxy (Vit-D Deficiency, Fractures)  9. 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 Adult onset dermatomyositis [facility]) Comments: Continue f/u with Dermatology   Patient was given opportunity to ask questions. Patient verbalized understanding of the plan and was able to repeat key elements of the plan. All questions were answered to their satisfaction.   [name], FNP   I, [name], FNP, have reviewed all documentation for this visit. The documentation on 09/08/22 for the exam, diagnosis, procedures, and orders are all accurate and complete.  THE PATIENT IS ENCOURAGED TO PRACTICE SOCIAL DISTANCING DUE TO THE COVID-19 PANDEMIC.   

## 2022-09-09 ENCOUNTER — Telehealth: Payer: Self-pay

## 2022-09-09 LAB — HEMOGLOBIN A1C
Est. average glucose Bld gHb Est-mCnc: 131 mg/dL
Hgb A1c MFr Bld: 6.2 % — ABNORMAL HIGH (ref 4.8–5.6)

## 2022-09-09 LAB — CMP14+EGFR
ALT: 20 IU/L (ref 0–32)
AST: 27 IU/L (ref 0–40)
Albumin/Globulin Ratio: 1.3 (ref 1.2–2.2)
Albumin: 4.4 g/dL (ref 3.9–4.9)
Alkaline Phosphatase: 71 IU/L (ref 44–121)
BUN/Creatinine Ratio: 15 (ref 9–23)
BUN: 8 mg/dL (ref 6–24)
Bilirubin Total: 0.4 mg/dL (ref 0.0–1.2)
CO2: 24 mmol/L (ref 20–29)
Calcium: 9.5 mg/dL (ref 8.7–10.2)
Chloride: 101 mmol/L (ref 96–106)
Creatinine, Ser: 0.53 mg/dL — ABNORMAL LOW (ref 0.57–1.00)
Globulin, Total: 3.5 g/dL (ref 1.5–4.5)
Glucose: 183 mg/dL — ABNORMAL HIGH (ref 70–99)
Potassium: 4 mmol/L (ref 3.5–5.2)
Sodium: 137 mmol/L (ref 134–144)
Total Protein: 7.9 g/dL (ref 6.0–8.5)
eGFR: 117 mL/min/{1.73_m2} (ref >59)

## 2022-09-09 LAB — VITAMIN D 25 HYDROXY (VIT D DEFICIENCY, FRACTURES): Vit D, 25-Hydroxy: 42.5 ng/mL (ref 30.0–100.0)

## 2022-09-09 LAB — CBC
Hematocrit: 36.5 % (ref 34.0–46.6)
Hemoglobin: 13.1 g/dL (ref 11.1–15.9)
MCH: 34 pg — ABNORMAL HIGH (ref 26.6–33.0)
MCHC: 35.9 g/dL — ABNORMAL HIGH (ref 31.5–35.7)
MCV: 95 fL (ref 79–97)
Platelets: 291 10*3/uL (ref 150–450)
RBC: 3.85 x10E6/uL (ref 3.77–5.28)
RDW: 11.3 % — ABNORMAL LOW (ref 11.7–15.4)
WBC: 6 10*3/uL (ref 3.4–10.8)

## 2022-09-09 LAB — LIPID PANEL
Chol/HDL Ratio: 4.1 ratio (ref 0.0–4.4)
Cholesterol, Total: 155 mg/dL (ref 100–199)
HDL: 38 mg/dL — ABNORMAL LOW (ref >39)
LDL Chol Calc (NIH): 92 mg/dL (ref 0–99)
Triglycerides: 138 mg/dL (ref 0–149)
VLDL Cholesterol Cal: 25 mg/dL (ref 5–40)

## 2022-09-09 LAB — INSULIN, RANDOM: INSULIN: 105 u[IU]/mL — ABNORMAL HIGH (ref 2.6–24.9)

## 2022-09-09 NOTE — Telephone Encounter (Signed)
Spoke with the patient about the hospital EGD she is scheduled for on 11/02/22.  The patient has had to reschedule this twice because she did not have a care partner.  She is now in new employment. She asks to be canceled at this time. She will have to build up her time off and reschedule when her new employer will allow her to take time. She states she will call us.

## 2022-09-10 LAB — MICROALBUMIN / CREATININE URINE RATIO
Creatinine, Urine: 16.5 mg/dL
Microalb/Creat Ratio: 61 mg/g creat — ABNORMAL HIGH (ref 0–29)
Microalbumin, Urine: 10 ug/mL

## 2022-09-11 DIAGNOSIS — F4320 Adjustment disorder, unspecified: Secondary | ICD-10-CM | POA: Diagnosis not present

## 2022-09-11 DIAGNOSIS — F4310 Post-traumatic stress disorder, unspecified: Secondary | ICD-10-CM | POA: Diagnosis not present

## 2022-09-18 DIAGNOSIS — F4310 Post-traumatic stress disorder, unspecified: Secondary | ICD-10-CM | POA: Diagnosis not present

## 2022-09-18 DIAGNOSIS — F4320 Adjustment disorder, unspecified: Secondary | ICD-10-CM | POA: Diagnosis not present

## 2022-09-23 ENCOUNTER — Telehealth: Payer: Self-pay

## 2022-09-23 ENCOUNTER — Ambulatory Visit: Payer: BC Managed Care – PPO | Admitting: Physician Assistant

## 2022-09-23 NOTE — Telephone Encounter (Signed)
Left pt message. Form ready for pick up, left up front.

## 2022-09-24 DIAGNOSIS — F4320 Adjustment disorder, unspecified: Secondary | ICD-10-CM | POA: Diagnosis not present

## 2022-09-24 DIAGNOSIS — F4310 Post-traumatic stress disorder, unspecified: Secondary | ICD-10-CM | POA: Diagnosis not present

## 2022-09-27 ENCOUNTER — Encounter: Payer: Self-pay | Admitting: Nurse Practitioner

## 2022-09-28 ENCOUNTER — Other Ambulatory Visit: Payer: Self-pay | Admitting: Nurse Practitioner

## 2022-09-28 DIAGNOSIS — N289 Disorder of kidney and ureter, unspecified: Secondary | ICD-10-CM

## 2022-09-28 DIAGNOSIS — F4320 Adjustment disorder, unspecified: Secondary | ICD-10-CM | POA: Diagnosis not present

## 2022-09-28 DIAGNOSIS — F4310 Post-traumatic stress disorder, unspecified: Secondary | ICD-10-CM | POA: Diagnosis not present

## 2022-10-06 ENCOUNTER — Encounter: Payer: Self-pay | Admitting: Rheumatology

## 2022-10-09 ENCOUNTER — Other Ambulatory Visit: Payer: Self-pay | Admitting: *Deleted

## 2022-10-09 DIAGNOSIS — F4320 Adjustment disorder, unspecified: Secondary | ICD-10-CM | POA: Diagnosis not present

## 2022-10-09 DIAGNOSIS — F4310 Post-traumatic stress disorder, unspecified: Secondary | ICD-10-CM | POA: Diagnosis not present

## 2022-10-09 DIAGNOSIS — Z79899 Other long term (current) drug therapy: Secondary | ICD-10-CM | POA: Diagnosis not present

## 2022-10-09 NOTE — Addendum Note (Signed)
Addended by: [name] F on: 10/09/2022 01:20 PM   Modules accepted: Level of Service

## 2022-10-10 LAB — COMPLETE METABOLIC PANEL WITH GFR
AG Ratio: 1.1 (calc) (ref 1.0–2.5)
ALT: 19 U/L (ref 6–29)
AST: 23 U/L (ref 10–30)
Albumin: 4.2 g/dL (ref 3.6–5.1)
Alkaline phosphatase (APISO): 67 U/L (ref 31–125)
BUN/Creatinine Ratio: 36 (calc) — ABNORMAL HIGH (ref 6–22)
BUN: 17 mg/dL (ref 7–25)
CO2: 24 mmol/L (ref 20–32)
Calcium: 9.3 mg/dL (ref 8.6–10.2)
Chloride: 105 mmol/L (ref 98–110)
Creat: 0.47 mg/dL — ABNORMAL LOW (ref 0.50–0.99)
Globulin: 3.8 g/dL (calc) — ABNORMAL HIGH (ref 1.9–3.7)
Glucose, Bld: 139 mg/dL — ABNORMAL HIGH (ref 65–99)
Potassium: 4 mmol/L (ref 3.5–5.3)
Sodium: 137 mmol/L (ref 135–146)
Total Bilirubin: 0.3 mg/dL (ref 0.2–1.2)
Total Protein: 8 g/dL (ref 6.1–8.1)
eGFR: 120 mL/min/{1.73_m2} (ref >=60)

## 2022-10-10 LAB — CBC WITH DIFFERENTIAL/PLATELET
Absolute Monocytes: 704 cells/uL (ref 200–950)
Basophils Absolute: 48 cells/uL (ref 0–200)
Basophils Relative: 0.7 %
Eosinophils Absolute: 359 cells/uL (ref 15–500)
Eosinophils Relative: 5.2 %
HCT: 35.5 % (ref 35.0–45.0)
Hemoglobin: 12.3 g/dL (ref 11.7–15.5)
Lymphs Abs: 2029 cells/uL (ref 850–3900)
MCH: 33.3 pg — ABNORMAL HIGH (ref 27.0–33.0)
MCHC: 34.6 g/dL (ref 32.0–36.0)
MCV: 96.2 fL (ref 80.0–100.0)
MPV: 9.8 fL (ref 7.5–12.5)
Monocytes Relative: 10.2 %
Neutro Abs: 3761 cells/uL (ref 1500–7800)
Neutrophils Relative %: 54.5 %
Platelets: 284 10*3/uL (ref 140–400)
RBC: 3.69 10*6/uL — ABNORMAL LOW (ref 3.80–5.10)
RDW: 12.3 % (ref 11.0–15.0)
Total Lymphocyte: 29.4 %
WBC: 6.9 10*3/uL (ref 3.8–10.8)

## 2022-10-12 NOTE — Progress Notes (Signed)
CBC and CMP are stable.  Glucose is mildly elevated, probably not a fasting sample.

## 2022-10-15 DIAGNOSIS — F4320 Adjustment disorder, unspecified: Secondary | ICD-10-CM | POA: Diagnosis not present

## 2022-10-15 DIAGNOSIS — F4310 Post-traumatic stress disorder, unspecified: Secondary | ICD-10-CM | POA: Diagnosis not present

## 2022-10-18 IMAGING — MG MM DIGITAL SCREENING BILAT W/ TOMO AND CAD
6 of 10 series · 6 of 30 positions shown · non-contrast
Comparison: None available.

CLINICAL DATA: Screening.

EXAM:
DIGITAL SCREENING BILATERAL MAMMOGRAM WITH TOMOSYNTHESIS AND CAD
TECHNIQUE: Bilateral screening digital craniocaudal and mediolateral oblique
mammograms were obtained. Bilateral screening digital breast
tomosynthesis was performed. The images were evaluated with
computer-aided detection.

[R CC synth-2D]
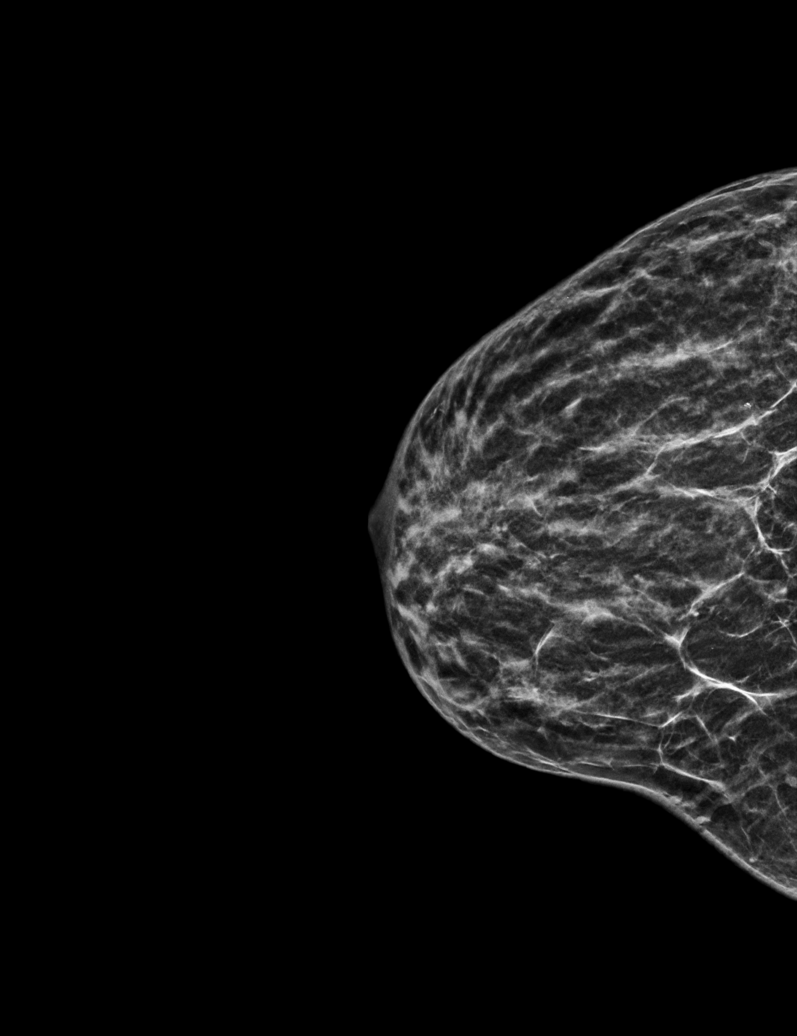

[L MLO synth-2D]
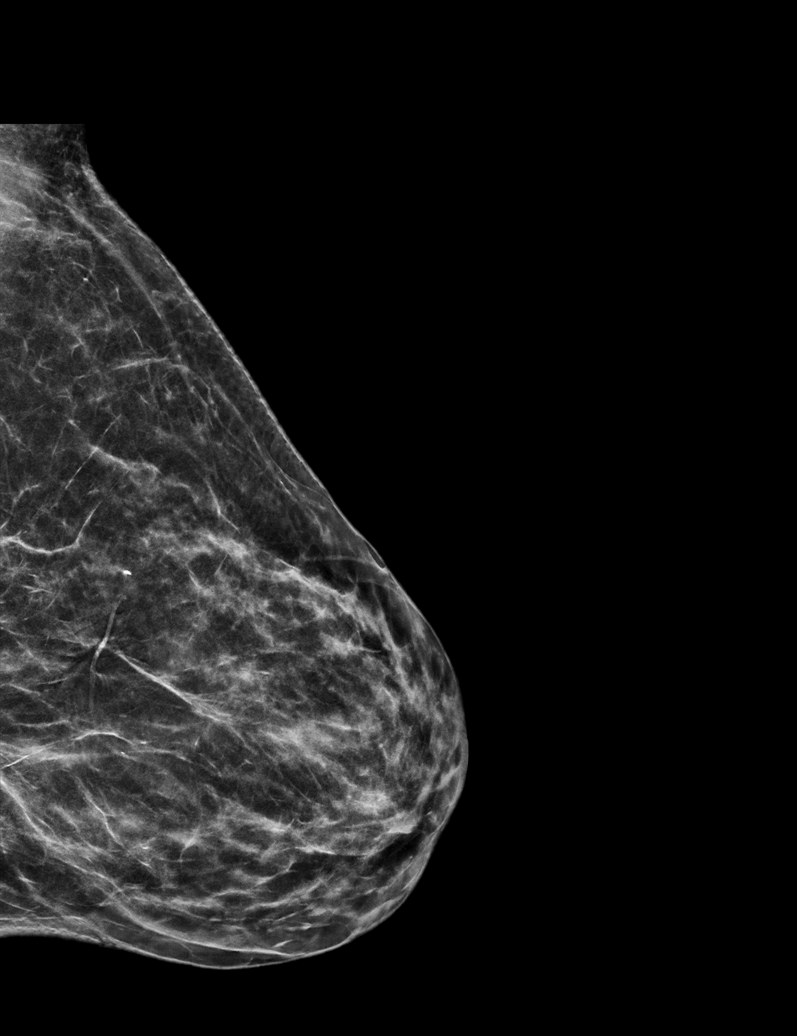

[L CC synth-2D]
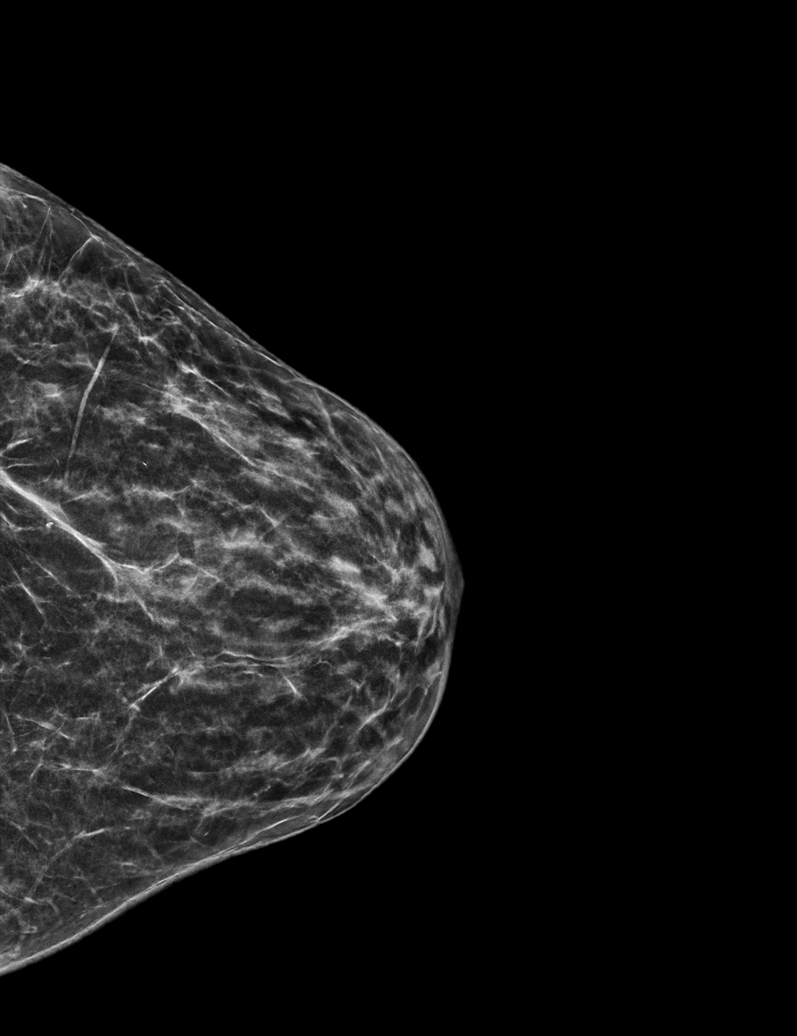

[R MLO synth-2D (1 of 2)]
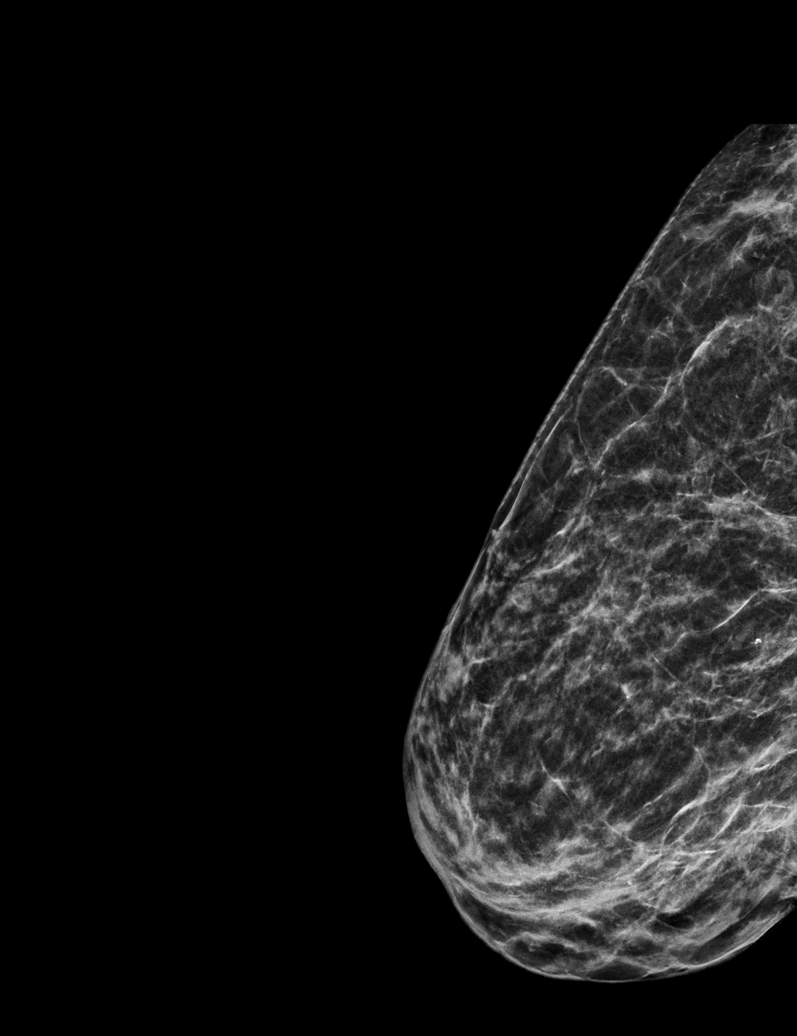

[R MLO synth-2D (2 of 2)]
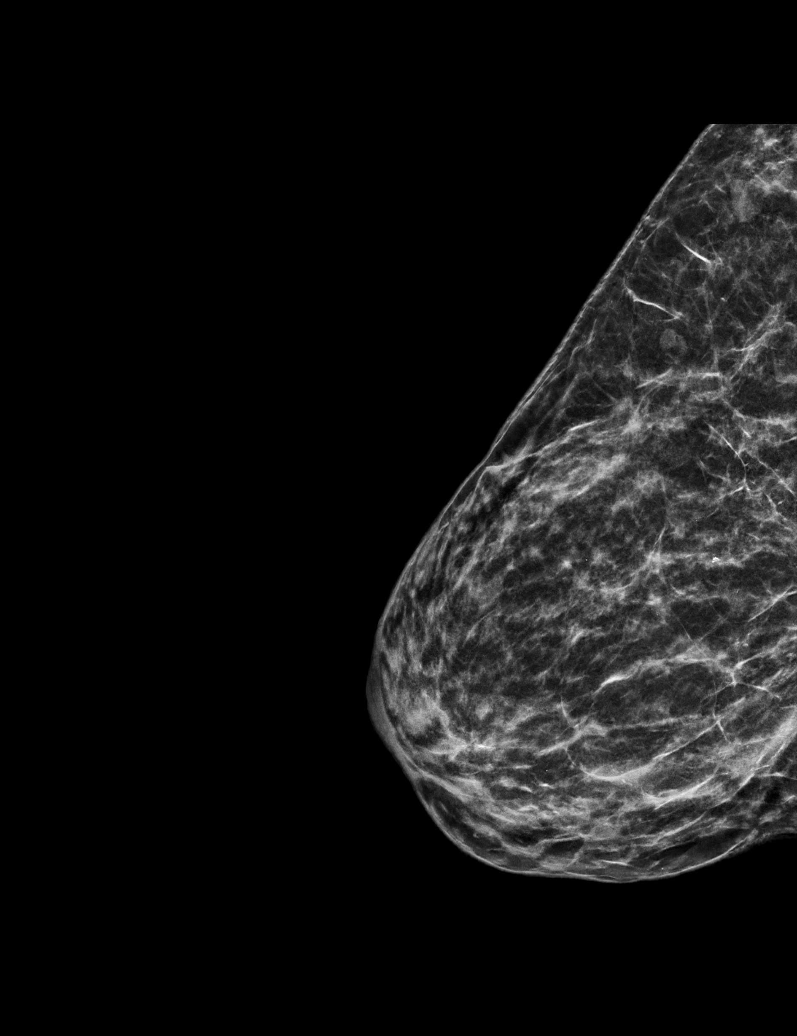

[R MLO tomo · tomo slice 19/38.0]
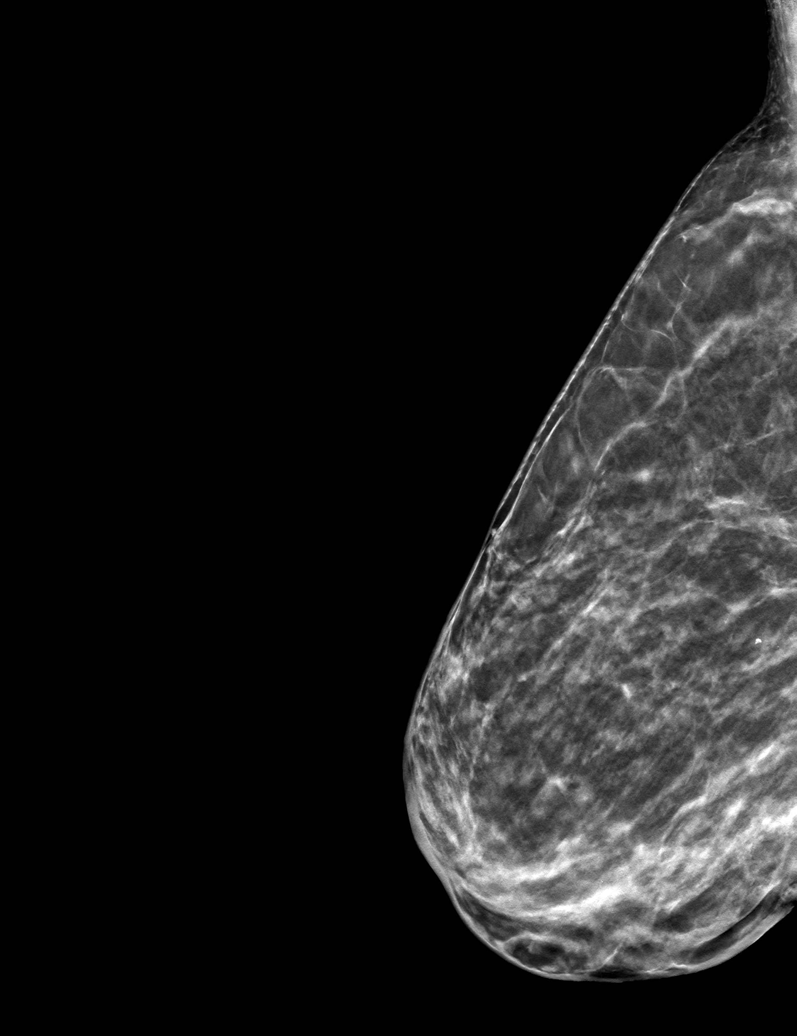

[6 of 30 positions shown; findings below may reference images not displayed]

ACR Breast Density Category c: The breast tissue is heterogeneously
dense, which may obscure small masses
FINDINGS: There are no findings suspicious for malignancy.
IMPRESSION: No mammographic evidence of malignancy. A result letter of this
screening mammogram will be mailed directly to the patient.

RECOMMENDATION:
Screening mammogram in one year. (Code:A7-O-PCM)

BI-RADS CATEGORY  1: Negative.

## 2022-10-22 DIAGNOSIS — F4310 Post-traumatic stress disorder, unspecified: Secondary | ICD-10-CM | POA: Diagnosis not present

## 2022-10-22 DIAGNOSIS — F4320 Adjustment disorder, unspecified: Secondary | ICD-10-CM | POA: Diagnosis not present

## 2022-10-23 ENCOUNTER — Other Ambulatory Visit: Payer: Self-pay | Admitting: Physician Assistant

## 2022-10-23 ENCOUNTER — Encounter: Payer: Self-pay | Admitting: Internal Medicine

## 2022-10-23 ENCOUNTER — Encounter: Payer: Self-pay | Admitting: Nurse Practitioner

## 2022-10-23 NOTE — Telephone Encounter (Signed)
Next Visit: 11/06/2022  Last Visit: 06/23/2022  Last Fill: 06/23/2022  DX: Adult onset dermatomyositis   Current Dose per office note 06/23/2022: Methotrexate 8 tablets by mouth once weekly   Labs: 10/09/2022 CBC and CMP are stable.  Glucose is mildly elevated, probably not a fasting sample.   Okay to refill MTX?

## 2022-10-23 NOTE — Progress Notes (Signed)
Office Visit Note  Patient: Patricia Krause             Date of Birth: 03-May-1976           MRN: [id number]             PCP: [name], FNP Referring: [name], FNP Visit Date: 11/06/2022 Occupation: '@GUAROCC'$ @  Subjective:  Medication monitoring   History of Present Illness: Patricia Krause is a 47 y.o. female with history of dermatomyositis and scleroderma.  She is taking methotrexate 8 tablets by mouth once weekly and folic acid 2 mg daily.  She has been tolerating methotrexate without any side effects or missed doses.  She denies any signs or symptoms of a dermatomyositis flare.  She has not noticed any new or worsening symptoms secondary to scleroderma.  Patient reports that she has been under increased stress related to work.  She states that ideally she would like to continue to work from home but has not found a good fit for work.  She continues to talk with her therapist once a week and is involved with her church community which has been helpful. She states that since she is able to work from home her symptoms of Raynaud's have been well-controlled.  She typically keeps her home temperature at 75 degrees.  She denies any new digital ulcerations or signs of gangrene.  She has not noticed any new rashes or skin changes.  She denies any increased muscle weakness.  She has not had any new or worsening pulmonary symptoms.  PFTs were updated in July 2023.  She does not currently have a follow-up scheduled with pulmonary or cardiology at this time but plans on scheduling follow-up visits.  She states that she has been following up with gastroenterology as recommended.  She had a colonoscopy on 04/07/2022 and will be having an upcoming endoscopy.  She had to postpone her last endoscopy due to changes with her work life.  She denies any dysphagia at this time.  She has reflux intermittently which she feels is dietary related.  She denies any constipation. She denies any joint pain or joint  swelling at this time.  Lymphedema in bilateral LE is unchanged.  She denies any recent or recurrent infections.   She is planning on receiving the covid-19 booster today.      Activities of Daily Living:  Patient reports morning stiffness for 3 minutes.   Patient Denies nocturnal pain.  Difficulty dressing/grooming: Reports Difficulty climbing stairs: Reports Difficulty getting out of chair: Denies Difficulty using hands for taps, buttons, cutlery, and/or writing: Reports  Review of Systems  Constitutional:  Positive for fatigue.  HENT:  Negative for mouth sores and mouth dryness.   Eyes:  Negative for dryness.  Respiratory:  Negative for shortness of breath.   Cardiovascular:  Negative for chest pain and palpitations.  Gastrointestinal:  Positive for diarrhea. Negative for blood in stool and constipation.  Endocrine: Positive for increased urination.  Genitourinary:  Negative for involuntary urination.  Musculoskeletal:  Positive for gait problem, myalgias, muscle weakness, morning stiffness, muscle tenderness and myalgias. Negative for joint pain, joint pain and joint swelling.  Skin:  Positive for sensitivity to sunlight. Negative for color change, rash and hair loss.  Allergic/Immunologic: Negative for susceptible to infections.  Neurological:  Negative for dizziness and headaches.  Hematological:  Negative for swollen glands.  Psychiatric/Behavioral:  Positive for depressed mood. Negative for sleep disturbance. The patient is not nervous/anxious.  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   Cancer Paternal Aunt        uncertain what type of cancer   Diabetes Paternal Grandmother    Alzheimer's disease Paternal Grandfather    High Cholesterol Brother    Diabetes Son    Autism Son    Colon cancer Neg Hx    Breast cancer Neg Hx    Past Surgical History:  Procedure Laterality Date   BREAST EXCISIONAL BIOPSY Left    BREAST SURGERY     COLONOSCOPY  03/2022   WISDOM TOOTH EXTRACTION     Social History   Social History Narrative   Not on file   Immunization History  Administered Date(s) Administered   Influenza,inj,Quad PF,6+ Mos 07/23/2021, 09/08/2022   PFIZER(Purple Top)SARS-COV-2 Vaccination 12/29/2019, 01/23/2020, 08/02/2020   Pfizer Covid-19 Vaccine Bivalent Booster 13yr & up 07/05/2021   Pneumococcal Polysaccharide-23 08/20/2021   Tdap 08/20/2021     Objective: Vital Signs: BP (!) 156/70 (BP Location: Left Arm, Patient Position: Sitting, Cuff Size: Small)   Pulse 85   Resp 15   Ht '5\' 8"'$  (1.727 m)   Wt 210 lb (95.3 kg)   BMI 31.93 kg/m    Physical Exam Vitals and nursing note reviewed.  Constitutional:      Appearance: She is well-developed.  HENT:     Head: Normocephalic and atraumatic.  Eyes:     Conjunctiva/sclera: Conjunctivae normal.  Cardiovascular:     Rate and Rhythm: Normal rate and regular rhythm.     Heart sounds: Normal heart sounds.  Pulmonary:     Effort: Pulmonary effort is normal.     Breath sounds: Normal breath sounds.  Abdominal:     General: Bowel sounds are normal.     Palpations: Abdomen is soft.  Musculoskeletal:     Cervical back: Normal range of motion.     Right lower leg: Edema present.     Left lower leg: Edema present.  Skin:    General: Skin is warm and dry.     Capillary Refill: Capillary refill  takes 2 to 3 seconds.     Comments: Poikiloderma noted.  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  No cyanosis or signs of gangrene noted.     Neurological:     Mental Status: She is alert and oriented to person, place, and time.  Psychiatric:        Behavior: Behavior normal.         Musculoskeletal Exam: C-spine has good range of motion with no discomfort at this time.  Shoulder joint abduction to about 120 degrees.  Elbow joint contractures noted but no tenderness or synovitis.  Wrist joints have good range of motion with no tenderness or synovitis.  Incomplete fist formation and contractures of PIP and DIP joints.  Loss of digital tuft noted.  Hip joints have good range of motion with no groin pain.  Knee joints have good range of motion with no warmth or effusion.  Lymphedema noted in bilateral lower extremities.  CDAI Exam: CDAI Score: -- Patient Global: --; Provider Global: -- Swollen: --; Tender: -- Joint Exam 11/06/2022   No joint exam has been documented for this visit   There is currently no information documented on the homunculus. Go to the Rheumatology activity and complete the homunculus joint exam.  Investigation: No additional findings.  Imaging: No results found.  Recent Labs: Lab Results  Component Value Date   WBC 6.9 10/09/2022   HGB 12.3 10/09/2022   PLT 284 10/09/2022   NA 137 10/09/2022   K 4.0 10/09/2022   CL 105 10/09/2022   CO2 24 10/09/2022   GLUCOSE 139 (H) 10/09/2022   BUN 17 10/09/2022   CREATININE 0.47 (L) 10/09/2022   BILITOT 0.3 10/09/2022   ALKPHOS 71 09/08/2022   AST 23 10/09/2022   ALT 19 10/09/2022   PROT 8.0 10/09/2022   ALBUMIN 4.4 09/08/2022   CALCIUM 9.3 10/09/2022   GFRAA >60 12/28/2019   QFTBGOLDPLUS NEGATIVE 11/06/2021    Speciality Comments: Dx in 2005-presented postpartum '04. Left bicep muscle bx 2005 + skin bx+. IVIG in the past.Ttd with MTX, PLQ and  MMF. 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 Left bicep muscle bx 2005 + skin bx+. IVIG, muscle wasting, poikiloderma, sclerodactyly, calcinosis, capillary changes: She has not had any signs or symptoms of a dermatomyositis flare since her last office visit.  She is not currently experiencing any increased muscular weakness.  No dysphagia.  No new rashes or skin lesions noted.  She remains on methotrexate 8 tablets by mouth once weekly along with folic acid 2 mg daily.  She continues to tolerate methotrexate without any side effects and has not missed any doses recently.  No recent or recurrent infections.  No signs of inflammatory arthritis at this time. Patient has been trying to keep up with recommended cancer screenings.  She had a colonoscopy performed on 04/07/2022.  She had will be scheduling an upcoming endoscopy as well.   CK was 352 on 04/16/2022.  CK will be checked today.  Recommend continuing to check CK every 3 months.   She will remain on methotrexate as prescribed.  She was advised to notify us if she develops signs or symptoms of a flare.  She will follow up in 3 months or sooner if needed.   - Plan: CK, CK  High risk medication use - Methotrexate 8 tablets by mouth once weekly and folic acid 2 mg daily-gap in therapy from 2018 until restarting in February 2023. CBC and CMP updated on 10/09/22.  Orders for CBC and CMP were released today while the patient was in the office.  She will continue to require updated lab work every 3 months along with updated CK every 3 months. No recent or recurrent infections. Discussed the importance of holding methotrexate if she develops signs or symptoms of an infection and to resume once the infection has  completely cleared. Patient is planning on getting the COVID-19 booster today.  Advised patient  to hold methotrexate for 1 week after receiving the booster.  - Plan: CBC with Differential/Platelet, COMPLETE METABOLIC PANEL WITH GFR  Scleroderma ([place]) - Serology was negative for scleroderma, although her clinical features are consistent with scleroderma:  Sclerodactyly appears to be unchanged.  No signs of inflammatory arthritis. Contractures of PIP and DIP joints.  She remains on methotrexate 8 tablets by mouth once weekly along with folic acid 2 mg daily.   She has not had any symptoms of Raynaud's recently.  She remains on amlodipine 5 mg 1 tablet daily.  No cyanosis or signs of gangrene were noted today.  She keeps her warm temperature at 75 degrees and has been working from home which has been helpful.   Patient has been following up with gastroenterology as recommended.  She had a colonoscopy on 04/07/2022 and will be scheduling an endoscopy.  She has intermittent symptoms of GERD typically exacerbated by dietary choices.  She has not had any symptoms of constipation.  No dysphagia recently. Advised to call to schedule appointment with pulmonology and cardiology for routine follow-up to screen for ILD and pulmonary hypertension.  PFTs were performed on 03/26/2022 and the echocardiogram was performed on 10/17/2021.  She is not currently experiencing any new or worsening pulmonary symptoms. Blood pressure was elevated today in the office at 156/70.  Advised patient to monitor blood pressure closely given the increased risk for renal crisis in patients with scleroderma. She was advised to notify us if she develops any new or worsening symptoms.  CBC, CMP, and UA will be checked today.  She will remain on methotrexate as prescribed. - Plan: Urinalysis, Routine w reflex microscopic, CBC with Differential/Platelet, COMPLETE METABOLIC PANEL WITH GFR  Raynaud's syndrome without gangrene: Not currently  active.  She remains on amlodipine 5 mg 1 tablet daily.  Her symptoms of Raynaud's phenomenon have been well-controlled given that she has been able to work from home.  She typically keeps her home thermostat set to 75 degrees which has been helpful.  Loss of digital tufts noted.  No cyanosis or signs of gangrene were noted.  Her fingertips and hands are warm to touch and capillary refill about 2 to 3 seconds today.  Discussed the importance of avoiding triggers as well as keeping her core body temperature warm and wearing gloves and socks especially she plans on being outdoors.  She was advised to notify us if she develops any new or worsening symptoms.  Shortness of breath: She is not currently experiencing any new or worsening pulmonary symptoms.  PFTs performed on 03/26/2022.  Advised to call to schedule a routine follow-up at [facility] pulmonary. Echocardiogram performed on 10/17/2021.  Advised to call cardiology to schedule routine follow-up for pulmonary hypertension screening.  Pain in both hands - Unremarkable x-rays of both hands on 11/06/21.  No erosive changes noted.  Pedal edema - Severe bilateral LE lymphedema noted. Establishing care at a lymphedema clinic.  Essential hypertension: Blood pressure was 156/70 today in the office.  Advised to monitor blood pressure closely especially given the increased risk for renal crisis in patients with scleroderma.  Vitamin D deficiency: She is taking vitamin D 50,000 units once weekly.  Other medical conditions are listed as follows:  History of asthma  Other fatigue: Stable.   Prediabetes  Orders: Orders Placed This Encounter  Procedures   CK   Urinalysis, Routine w reflex microscopic   CK   CBC with Differential/Platelet   COMPLETE METABOLIC PANEL WITH GFR  No orders of the defined types were placed in this encounter.   Follow-Up Instructions: Return in about 3 months (around 02/05/2023) for Dermatomyositis, Scleroderma.   Ofilia Neas, PA-C  Note - This record has been created using Dragon software.  Chart creation errors have been sought, but may not always  have been located. Such creation errors do not reflect on  the standard of medical care.

## 2022-10-26 DIAGNOSIS — F4320 Adjustment disorder, unspecified: Secondary | ICD-10-CM | POA: Diagnosis not present

## 2022-10-26 DIAGNOSIS — F4310 Post-traumatic stress disorder, unspecified: Secondary | ICD-10-CM | POA: Diagnosis not present

## 2022-10-27 NOTE — Addendum Note (Signed)
Addended by: Azalee Course T on: 10/27/2022 08:17 AM   Modules accepted: Orders

## 2022-11-02 ENCOUNTER — Encounter (HOSPITAL_COMMUNITY): Payer: Self-pay

## 2022-11-02 ENCOUNTER — Ambulatory Visit (HOSPITAL_COMMUNITY): Admit: 2022-11-02 | Payer: BC Managed Care – PPO | Admitting: Internal Medicine

## 2022-11-02 DIAGNOSIS — F4320 Adjustment disorder, unspecified: Secondary | ICD-10-CM | POA: Diagnosis not present

## 2022-11-02 DIAGNOSIS — F4310 Post-traumatic stress disorder, unspecified: Secondary | ICD-10-CM | POA: Diagnosis not present

## 2022-11-02 SURGERY — ESOPHAGOGASTRODUODENOSCOPY (EGD) WITH PROPOFOL
Anesthesia: Monitor Anesthesia Care

## 2022-11-06 ENCOUNTER — Ambulatory Visit: Payer: BC Managed Care – PPO | Attending: Physician Assistant | Admitting: Physician Assistant

## 2022-11-06 ENCOUNTER — Encounter: Payer: Self-pay | Admitting: Physician Assistant

## 2022-11-06 VITALS — BP 156/70 | HR 85 | Resp 15 | Ht 68.0 in | Wt 210.0 lb

## 2022-11-06 DIAGNOSIS — M79641 Pain in right hand: Secondary | ICD-10-CM

## 2022-11-06 DIAGNOSIS — Z8709 Personal history of other diseases of the respiratory system: Secondary | ICD-10-CM

## 2022-11-06 DIAGNOSIS — I10 Essential (primary) hypertension: Secondary | ICD-10-CM

## 2022-11-06 DIAGNOSIS — M349 Systemic sclerosis, unspecified: Secondary | ICD-10-CM

## 2022-11-06 DIAGNOSIS — M79642 Pain in left hand: Secondary | ICD-10-CM

## 2022-11-06 DIAGNOSIS — I7300 Raynaud's syndrome without gangrene: Secondary | ICD-10-CM

## 2022-11-06 DIAGNOSIS — E559 Vitamin D deficiency, unspecified: Secondary | ICD-10-CM

## 2022-11-06 DIAGNOSIS — R0602 Shortness of breath: Secondary | ICD-10-CM

## 2022-11-06 DIAGNOSIS — M3310 Other dermatopolymyositis, organ involvement unspecified: Secondary | ICD-10-CM

## 2022-11-06 DIAGNOSIS — R7303 Prediabetes: Secondary | ICD-10-CM

## 2022-11-06 DIAGNOSIS — Z79899 Other long term (current) drug therapy: Secondary | ICD-10-CM | POA: Diagnosis not present

## 2022-11-06 DIAGNOSIS — R600 Localized edema: Secondary | ICD-10-CM

## 2022-11-06 DIAGNOSIS — R5383 Other fatigue: Secondary | ICD-10-CM

## 2022-11-07 LAB — CBC WITH DIFFERENTIAL/PLATELET
Absolute Monocytes: 625 cells/uL (ref 200–950)
Basophils Absolute: 47 cells/uL (ref 0–200)
Basophils Relative: 0.8 %
Eosinophils Absolute: 319 cells/uL (ref 15–500)
Eosinophils Relative: 5.4 %
HCT: 36.4 % (ref 35.0–45.0)
Hemoglobin: 12.4 g/dL (ref 11.7–15.5)
Lymphs Abs: 2466 cells/uL (ref 850–3900)
MCH: 32.4 pg (ref 27.0–33.0)
MCHC: 34.1 g/dL (ref 32.0–36.0)
MCV: 95 fL (ref 80.0–100.0)
MPV: 9.9 fL (ref 7.5–12.5)
Monocytes Relative: 10.6 %
Neutro Abs: 2443 cells/uL (ref 1500–7800)
Neutrophils Relative %: 41.4 %
Platelets: 307 10*3/uL (ref 140–400)
RBC: 3.83 10*6/uL (ref 3.80–5.10)
RDW: 12.4 % (ref 11.0–15.0)
Total Lymphocyte: 41.8 %
WBC: 5.9 10*3/uL (ref 3.8–10.8)

## 2022-11-07 LAB — COMPLETE METABOLIC PANEL WITH GFR
AG Ratio: 1.1 (calc) (ref 1.0–2.5)
ALT: 23 U/L (ref 6–29)
AST: 30 U/L (ref 10–30)
Albumin: 4.2 g/dL (ref 3.6–5.1)
Alkaline phosphatase (APISO): 63 U/L (ref 31–125)
BUN: 14 mg/dL (ref 7–25)
CO2: 21 mmol/L (ref 20–32)
Calcium: 9.1 mg/dL (ref 8.6–10.2)
Chloride: 107 mmol/L (ref 98–110)
Creat: 0.5 mg/dL (ref 0.50–0.99)
Globulin: 3.7 g/dL (calc) (ref 1.9–3.7)
Glucose, Bld: 118 mg/dL — ABNORMAL HIGH (ref 65–99)
Potassium: 4.1 mmol/L (ref 3.5–5.3)
Sodium: 137 mmol/L (ref 135–146)
Total Bilirubin: 0.3 mg/dL (ref 0.2–1.2)
Total Protein: 7.9 g/dL (ref 6.1–8.1)
eGFR: 119 mL/min/{1.73_m2} (ref >=60)

## 2022-11-07 LAB — URINALYSIS, ROUTINE W REFLEX MICROSCOPIC
Bacteria, UA: NONE SEEN /HPF
Bilirubin Urine: NEGATIVE
Glucose, UA: NEGATIVE
Hgb urine dipstick: NEGATIVE
Hyaline Cast: NONE SEEN /LPF
Ketones, ur: NEGATIVE
Nitrite: NEGATIVE
Protein, ur: NEGATIVE
RBC / HPF: NONE SEEN /HPF (ref 0–2)
Specific Gravity, Urine: 1.011 (ref 1.001–1.035)
pH: 5.5 (ref 5.0–8.0)

## 2022-11-07 LAB — MICROSCOPIC MESSAGE

## 2022-11-07 LAB — CK: Total CK: 376 U/L — ABNORMAL HIGH (ref 29–143)

## 2022-11-09 ENCOUNTER — Encounter: Payer: Self-pay | Admitting: Interventional Cardiology

## 2022-11-09 ENCOUNTER — Encounter: Payer: Self-pay | Admitting: Student

## 2022-11-09 DIAGNOSIS — J449 Chronic obstructive pulmonary disease, unspecified: Secondary | ICD-10-CM

## 2022-11-09 DIAGNOSIS — J4520 Mild intermittent asthma, uncomplicated: Secondary | ICD-10-CM

## 2022-11-09 NOTE — Progress Notes (Signed)
CBC WNL.  Glucose is 118. Rest of CMP WNL.  CK remains elevated but is stable.  CK will continue to be checked every 3 months.   UA revealed trace leukocytes.  Negative for nitrites and bacteria.  If she develops signs or symptoms of a UTI she should follow up with her PCP for further evaluation.

## 2022-11-12 DIAGNOSIS — F4310 Post-traumatic stress disorder, unspecified: Secondary | ICD-10-CM | POA: Diagnosis not present

## 2022-11-12 DIAGNOSIS — F4320 Adjustment disorder, unspecified: Secondary | ICD-10-CM | POA: Diagnosis not present

## 2022-11-19 DIAGNOSIS — F4310 Post-traumatic stress disorder, unspecified: Secondary | ICD-10-CM | POA: Diagnosis not present

## 2022-11-19 DIAGNOSIS — F4320 Adjustment disorder, unspecified: Secondary | ICD-10-CM | POA: Diagnosis not present

## 2022-11-19 NOTE — Telephone Encounter (Signed)
Absolutely.

## 2022-11-20 NOTE — Telephone Encounter (Signed)
Pft order placed. Tried to call pt to get appts scheduled but was unable to reach. Left detailed message for pt to call office to get appts scheduled.

## 2022-11-28 ENCOUNTER — Other Ambulatory Visit: Payer: Self-pay | Admitting: Physician Assistant

## 2022-11-28 DIAGNOSIS — Z79899 Other long term (current) drug therapy: Secondary | ICD-10-CM

## 2022-11-28 DIAGNOSIS — M3310 Other dermatopolymyositis, organ involvement unspecified: Secondary | ICD-10-CM

## 2022-11-28 DIAGNOSIS — M349 Systemic sclerosis, unspecified: Secondary | ICD-10-CM

## 2022-11-30 ENCOUNTER — Encounter: Payer: Self-pay | Admitting: Nurse Practitioner

## 2022-11-30 ENCOUNTER — Other Ambulatory Visit: Payer: Self-pay | Admitting: Nurse Practitioner

## 2022-11-30 MED ORDER — HYDROXYZINE HCL 25 MG PO TABS: 25.0000 mg | ORAL_TABLET | Freq: Three times a day (TID) | ORAL | 1 refills | Status: DC | PRN

## 2022-11-30 NOTE — Telephone Encounter (Signed)
Next Visit: 02/04/2023  Last Visit: 11/06/2022  Last Fill: 05/28/2022  Dx: Adult onset dermatomyositis   Current Dose per office note on [id number]: folic acid 2 mg daily   Okay to refill Folic Acid?

## 2022-12-03 DIAGNOSIS — F4320 Adjustment disorder, unspecified: Secondary | ICD-10-CM | POA: Diagnosis not present

## 2022-12-03 DIAGNOSIS — F4310 Post-traumatic stress disorder, unspecified: Secondary | ICD-10-CM | POA: Diagnosis not present

## 2022-12-04 ENCOUNTER — Ambulatory Visit: Payer: BC Managed Care – PPO | Admitting: Internal Medicine

## 2022-12-08 DIAGNOSIS — F4310 Post-traumatic stress disorder, unspecified: Secondary | ICD-10-CM | POA: Diagnosis not present

## 2022-12-08 DIAGNOSIS — F4320 Adjustment disorder, unspecified: Secondary | ICD-10-CM | POA: Diagnosis not present

## 2022-12-16 DIAGNOSIS — F4320 Adjustment disorder, unspecified: Secondary | ICD-10-CM | POA: Diagnosis not present

## 2022-12-16 DIAGNOSIS — F4310 Post-traumatic stress disorder, unspecified: Secondary | ICD-10-CM | POA: Diagnosis not present

## 2022-12-29 DIAGNOSIS — F4310 Post-traumatic stress disorder, unspecified: Secondary | ICD-10-CM | POA: Diagnosis not present

## 2022-12-29 DIAGNOSIS — F4320 Adjustment disorder, unspecified: Secondary | ICD-10-CM | POA: Diagnosis not present

## 2022-12-31 ENCOUNTER — Other Ambulatory Visit: Payer: Self-pay | Admitting: Nurse Practitioner

## 2022-12-31 DIAGNOSIS — Z0000 Encounter for general adult medical examination without abnormal findings: Secondary | ICD-10-CM

## 2023-01-04 DIAGNOSIS — F4320 Adjustment disorder, unspecified: Secondary | ICD-10-CM | POA: Diagnosis not present

## 2023-01-04 DIAGNOSIS — F4310 Post-traumatic stress disorder, unspecified: Secondary | ICD-10-CM | POA: Diagnosis not present

## 2023-01-05 ENCOUNTER — Encounter: Payer: Self-pay | Admitting: Internal Medicine

## 2023-01-05 ENCOUNTER — Ambulatory Visit (INDEPENDENT_AMBULATORY_CARE_PROVIDER_SITE_OTHER): Payer: BC Managed Care – PPO | Admitting: Internal Medicine

## 2023-01-05 VITALS — BP 100/60 | HR 80 | Ht 68.0 in | Wt 206.0 lb

## 2023-01-05 DIAGNOSIS — T884XXD Failed or difficult intubation, subsequent encounter: Secondary | ICD-10-CM

## 2023-01-05 DIAGNOSIS — R1310 Dysphagia, unspecified: Secondary | ICD-10-CM | POA: Diagnosis not present

## 2023-01-05 NOTE — Patient Instructions (Signed)
   If your blood pressure at your visit was 140/90 or greater, please contact your primary care physician to follow up on this.    If you are age 45 or older, your body mass index should be between 23-30. Your Body mass index is 31.32 kg/m. If this is out of the aforementioned range listed, please consider follow up with your Primary Care Provider.  If you are age 77 or younger, your body mass index should be between 19-25. Your Body mass index is 31.32 kg/m. If this is out of the aformentioned range listed, please consider follow up with your Primary Care Provider.   The [facility] providers would like to encourage you to use [facility] to communicate with providers for non-urgent requests or questions.  Due to long hold times on the telephone, sending your provider a message by [facility] may be a faster and more efficient way to get a response.  Please allow 48 business hours for a response.  Please remember that this is for non-urgent requests.    Thank you for entrusting me with your care and for choosing [facility], Dr. Christia Reading

## 2023-01-05 NOTE — Progress Notes (Signed)
Chief Complaint: Dysphagia  HPI : 47 year old female with history of dermatomyositis, scleroderma, DM, asthma, HFpEF presents for follow up of dysphagia  Interval History: She is about the same as in the past. She still has dysphagia and has to eat slowly. Dysphagia has been stable over time. She does think that dry foods may worsen her dysphagia, but she can eat soups without issues. She is figuring out what job to take in order to make sure that she can make her appointments and make ends meet. She had to reschedule her EGD several times because she was worried about how she would pay for her procedure. Her weight has decreased slightly with a few lbs over the last few months. Her bowel habits have been okay. Her rectal bleeding has improved. She has been trying to eat more fiber.   Wt Readings from Last 3 Encounters:  01/05/23 206 lb (93.4 kg)  11/06/22 210 lb (95.3 kg)  09/08/22 211 lb 3.2 oz (95.8 kg)   Current Outpatient Medications  Medication Sig Dispense Refill   albuterol (VENTOLIN HFA) 108 (90 Base) MCG/ACT inhaler Inhale 2 puffs into the lungs every 6 (six) hours as needed for wheezing or shortness of breath. 8 g 2   amLODipine (NORVASC) 5 MG tablet Take 1 tablet (5 mg total) by mouth daily. 90 tablet 1   chlorhexidine (PERIDEX) 0.12 % solution Use as directed 5 mLs in the mouth or throat daily.     fluticasone (FLONASE) 50 MCG/ACT nasal spray Place 2 sprays into both nostrils daily. 123XX123 mL 2   folic acid (FOLVITE) 1 MG tablet TAKE 2 TABLETS(2 MG) BY MOUTH DAILY 180 tablet 3   hydrocortisone (ANUSOL-HC) 2.5 % rectal cream Place 1 Application rectally 2 (two) times daily. 30 g 1   hydrOXYzine (ATARAX) 25 MG tablet Take 1 tablet (25 mg total) by mouth 3 (three) times daily as needed. 270 tablet 1   methotrexate (RHEUMATREX) 2.5 MG tablet TAKE 8 TABLETS ONCE WEEKLY 96 tablet 0   mupirocin ointment (BACTROBAN) 2 % Apply 1 application topically 2 (two) times daily. 30 g 3    Phentermine-Topiramate (QSYMIA) 11.25-69 MG CP24 Take 1 tablet by mouth daily. 90 capsule 0   Vitamin D, Ergocalciferol, (DRISDOL) 1.25 MG (50000 UNIT) CAPS capsule Take 1 capsule (50,000 Units total) by mouth every 7 (seven) days. 24 capsule 1   No current facility-administered medications for this visit.   Physical Exam: BP 100/60 (BP Location: Left Arm, Patient Position: Sitting, Cuff Size: Normal)   Pulse 80   Ht 5\' 8"  (1.727 m)   Wt 206 lb (93.4 kg)   BMI 31.32 kg/m  Constitutional: Pleasant,well-developed, female in no acute distress. HEENT: Normocephalic and atraumatic. Conjunctivae are normal. No scleral icterus. Cardiovascular: Normal rate, regular rhythm.  Pulmonary/chest: Effort normal and breath sounds normal. No wheezing, rales or rhonchi. Abdominal: Soft, nondistended, mildly distended. Bowel sounds active throughout. There are no masses palpable. No hepatomegaly. Extremities: Prominent lymphedema in both legs Neurological: Alert and oriented to person place and time. Skin: Skin is warm and dry. No rashes noted. Psychiatric: Normal mood and affect. Behavior is normal.  Labs 12/2021: CBC unremarkable. CMP unremarkable.  Labs 01/2022: TSH nml  Labs 10/2022: CBC and CMP unremarkable.  CT chest 11/29/21: IMPRESSION: 1. There are some very mild fibrotic changes in the lungs, which are nonspecific in appearance, at this time categorized as most compatible with an alternative diagnosis (not usual interstitial pneumonia) per current ATS guidelines.  Given the unusual distribution, these are favored to represent areas of mild post infectious or inflammatory scarring. However, if there is persistent clinical concern for interstitial lung disease, repeat high-resolution chest CT should be considered in 12 months to assess for temporal changes in the appearance of the lung parenchyma. 2. Moderate air trapping indicative of small airways disease.  CT A/P w/contrast  12/12/21: IMPRESSION: No acute findings in the abdomen or pelvis. 3.6 cm left ovarian cyst. No follow-up imaging recommended. Note: This recommendation does not apply to premenarchal patients and to those with increased risk (genetic, family history, elevated tumor markers or other high-risk factors) of ovarian cancer. Reference: JACR 2020 Feb; 17(2):248-254 Low-density areas in the uterus, likely fibroids.  Colonoscopy 04/07/22:  Path: Surgical [P], colon, transverse, polyp (1) - POLYPOID COLONIC MUCOSA WITH MILD HYPERPLASTIC CHANGES - NEGATIVE FOR DYSPLASIA - MULTIPLE STEP SECTIONS WERE EXAMINED  ASSESSMENT AND PLAN: Dysphagia Dermatomyositis Difficult airway Patients for follow up of dysphagia, which has been stable over time. She was labelled with a difficult airway before her last colonoscopy so she had to get her colonoscopy unsedated. We attempted to reschedule her EGD to be done in the hospital, but she has had financial difficulties so she has been putting off the EGD procedure. Per patient preference, she would like to delay her EGD to 6 months from now so that she can hopefully pay off her deductible first. Thus will plan recall for that time.  - Per patient preference, will place recall for hospital EGD in 6 months  Christia Reading, MD  I spent 31 minutes of time, including in depth chart review, independent review of results as outlined above, communicating results with the patient directly, face-to-face time with the patient, coordinating care, and ordering studies and medications as appropriate, and documentation.

## 2023-01-12 ENCOUNTER — Other Ambulatory Visit: Payer: Self-pay | Admitting: *Deleted

## 2023-01-12 MED ORDER — METHOTREXATE SODIUM 2.5 MG PO TABS: ORAL_TABLET | ORAL | 0 refills | Status: DC

## 2023-01-12 NOTE — Telephone Encounter (Signed)
Last Fill: 10/23/2022  Labs: 11/06/2022 CBC WNL. Glucose is 118. Rest of CMP WNL. CK remains elevated but is stable. CK will continue to be checked every 3 months.   UA revealed trace leukocytes.  Negative for nitrites and bacteria.  If she develops signs or symptoms of a UTI she should follow up with her PCP for further evaluation.  Next Visit: 02/04/2023  Last Visit: 11/06/2022  DX: Adult onset dermatomyositis   Current Dose per office note 11/06/2022: Methotrexate 8 tablets by mouth once weekly   Okay to refill Methotrexate?

## 2023-01-13 DIAGNOSIS — F4320 Adjustment disorder, unspecified: Secondary | ICD-10-CM | POA: Diagnosis not present

## 2023-01-13 DIAGNOSIS — F4310 Post-traumatic stress disorder, unspecified: Secondary | ICD-10-CM | POA: Diagnosis not present

## 2023-01-20 DIAGNOSIS — F4320 Adjustment disorder, unspecified: Secondary | ICD-10-CM | POA: Diagnosis not present

## 2023-01-20 DIAGNOSIS — F4310 Post-traumatic stress disorder, unspecified: Secondary | ICD-10-CM | POA: Diagnosis not present

## 2023-01-22 NOTE — Progress Notes (Deleted)
Office Visit Note  Patient: Patricia Krause             Date of Birth: 1975/11/22           MRN: [id number]             PCP: Arnette Felts, FNP Referring: Arnette Felts, FNP Visit Date: 02/04/2023 Occupation: @GUAROCC @  Subjective:  No chief complaint on file.   History of Present Illness: Patricia Krause is a 47 y.o. female ***     Activities of Daily Living:  Patient reports morning stiffness for *** {minute/hour:19697}.   Patient {ACTIONS;DENIES/REPORTS:21021675::"Denies"} nocturnal pain.  Difficulty dressing/grooming: {ACTIONS;DENIES/REPORTS:21021675::"Denies"} Difficulty climbing stairs: {ACTIONS;DENIES/REPORTS:21021675::"Denies"} Difficulty getting out of chair: {ACTIONS;DENIES/REPORTS:21021675::"Denies"} Difficulty using hands for taps, buttons, cutlery, and/or writing: {ACTIONS;DENIES/REPORTS:21021675::"Denies"}  No Rheumatology ROS completed.   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   Cancer Paternal Aunt        uncertain what type of cancer   Diabetes Paternal Grandmother    Alzheimer's disease Paternal Grandfather    High Cholesterol Brother    Diabetes Son    Autism Son    Colon cancer Neg Hx    Breast cancer Neg Hx    Past Surgical History:  Procedure Laterality Date   BREAST EXCISIONAL BIOPSY Left    BREAST SURGERY     COLONOSCOPY  03/2022   WISDOM TOOTH  EXTRACTION     Social History   Social History Narrative   Not on file   Immunization History  Administered Date(s) Administered   Influenza,inj,Quad PF,6+ Mos 07/23/2021, 09/08/2022   PFIZER(Purple Top)SARS-COV-2 Vaccination 12/29/2019, 01/23/2020, 08/02/2020   Pfizer Covid-19 Vaccine Bivalent Booster 98yrs & up 07/05/2021   Pneumococcal Polysaccharide-23 08/20/2021   Tdap 08/20/2021     Objective: Vital Signs: There were no vitals taken for this visit.   Physical Exam   Musculoskeletal Exam: ***  CDAI Exam: CDAI Score: -- Patient Global: --; Provider Global: -- Swollen: --; Tender: -- Joint Exam 02/04/2023   No joint exam has been documented for this visit   There is currently no information documented on the homunculus. Go to the Rheumatology activity and complete the homunculus joint exam.  Investigation: No additional findings.  Imaging: No results found.  Recent Labs: Lab Results  Component Value Date   WBC 5.9 11/06/2022   HGB 12.4 11/06/2022   PLT 307 11/06/2022   NA 137 11/06/2022   K 4.1 11/06/2022   CL 107 11/06/2022   CO2 21 11/06/2022   GLUCOSE 118 (H) 11/06/2022   BUN 14 11/06/2022   CREATININE 0.50 11/06/2022   BILITOT 0.3 11/06/2022   ALKPHOS 71 09/08/2022   AST 30 11/06/2022   ALT 23 11/06/2022   PROT 7.9 11/06/2022   ALBUMIN 4.4 09/08/2022   CALCIUM 9.1 11/06/2022  GFRAA >60 12/28/2019   QFTBGOLDPLUS NEGATIVE 11/06/2021    Speciality Comments: Dx in 2005-presented postpartum '04. Left bicep muscle bx 2005 + skin bx+. IVIG in the past.Ttd with MTX, PLQ and MMF. Off all meds since 2018  Procedures:  No procedures performed Allergies: Prunus persica, Cat hair extract, Mixed ragweed, and Pollinex-t [modified tree tyrosine adsorbate]   Assessment / Plan:     Visit Diagnoses: No diagnosis found.  Orders: No orders of the defined types were placed in this encounter.  No orders of the defined types were placed in this  encounter.   Face-to-face time spent with patient was *** minutes. Greater than 50% of time was spent in counseling and coordination of care.  Follow-Up Instructions: No follow-ups on file.   Ellen Henri, CMA  Note - This record has been created using Animal nutritionist.  Chart creation errors have been sought, but may not always  have been located. Such creation errors do not reflect on  the standard of medical care.

## 2023-02-02 ENCOUNTER — Ambulatory Visit: Payer: BC Managed Care – PPO | Admitting: Physician Assistant

## 2023-02-04 ENCOUNTER — Ambulatory Visit: Payer: BC Managed Care – PPO | Admitting: Rheumatology

## 2023-02-04 DIAGNOSIS — M349 Systemic sclerosis, unspecified: Secondary | ICD-10-CM

## 2023-02-04 DIAGNOSIS — R7303 Prediabetes: Secondary | ICD-10-CM

## 2023-02-04 DIAGNOSIS — I7300 Raynaud's syndrome without gangrene: Secondary | ICD-10-CM

## 2023-02-04 DIAGNOSIS — Z79899 Other long term (current) drug therapy: Secondary | ICD-10-CM

## 2023-02-04 DIAGNOSIS — R0602 Shortness of breath: Secondary | ICD-10-CM

## 2023-02-04 DIAGNOSIS — R5383 Other fatigue: Secondary | ICD-10-CM

## 2023-02-04 DIAGNOSIS — M79641 Pain in right hand: Secondary | ICD-10-CM

## 2023-02-04 DIAGNOSIS — I10 Essential (primary) hypertension: Secondary | ICD-10-CM

## 2023-02-04 DIAGNOSIS — R600 Localized edema: Secondary | ICD-10-CM

## 2023-02-04 DIAGNOSIS — E559 Vitamin D deficiency, unspecified: Secondary | ICD-10-CM

## 2023-02-04 DIAGNOSIS — M3310 Other dermatopolymyositis, organ involvement unspecified: Secondary | ICD-10-CM

## 2023-02-04 DIAGNOSIS — Z8709 Personal history of other diseases of the respiratory system: Secondary | ICD-10-CM

## 2023-02-04 NOTE — Progress Notes (Signed)
Office Visit Note  Patient: Patricia Krause             Date of Birth: 1976/10/04           MRN: [id number]             PCP: Arnette Felts, FNP Referring: Arnette Felts, FNP Visit Date: 02/09/2023 Occupation: @GUAROCC @  Subjective:  Medication management  History of Present Illness: Patricia Krause is a 47 y.o. female with history of dermatomyositis and scleroderma overlap.  Patricia Krause denies any increased skin tightness.  Raynauds symptoms are tolerable currently.  Patricia Krause denies any increased muscular weakness or tenderness.  Patricia Krause denies any increased shortness of breath.  Patricia Krause has an appointment coming up with the pulmonologist.  Cardiology appointment is pending.  Patricia Krause had a colonoscopy last year.  Patricia Krause states Patricia Krause has to schedule endoscopy as the previous endoscopy was canceled.  Reflux symptoms are intermittent and not bothersome.  Patricia Krause denies any joint swelling.  Patricia Krause continues to take methotrexate 8 tablets p.o. weekly along with folic acid 2 mg p.o. daily.  Patricia Krause had no interruption in the treatment.    Activities of Daily Living:  Patient reports morning stiffness for 30 minutes.   Patient Denies nocturnal pain.  Difficulty dressing/grooming: Reports Difficulty climbing stairs: Reports Difficulty getting out of chair: Denies Difficulty using hands for taps, buttons, cutlery, and/or writing: Reports  Review of Systems  Constitutional:  Positive for fatigue.  HENT:  Positive for mouth dryness. Negative for mouth sores.   Eyes:  Negative for dryness.  Respiratory:  Negative for shortness of breath.   Cardiovascular:  Negative for chest pain and palpitations.  Gastrointestinal:  Negative for blood in stool, constipation and diarrhea.  Endocrine: Negative for increased urination.  Genitourinary:  Negative for involuntary urination.  Musculoskeletal:  Positive for myalgias, muscle weakness, morning stiffness, muscle tenderness and myalgias. Negative for joint pain, gait problem, joint pain and  joint swelling.  Skin:  Positive for color change and sensitivity to sunlight. Negative for rash and hair loss.  Allergic/Immunologic: Negative for susceptible to infections.  Neurological:  Positive for headaches. Negative for dizziness.  Hematological:  Negative for swollen glands.  Psychiatric/Behavioral:  Negative for depressed mood and sleep disturbance. The patient is not nervous/anxious.     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   Cancer Paternal Aunt        uncertain what type of cancer   Diabetes Paternal Grandmother    Alzheimer's disease Paternal Grandfather    High Cholesterol Brother    Diabetes Son    Autism Son    Colon cancer Neg Hx    Breast cancer Neg Hx    Past Surgical History:  Procedure Laterality Date   BREAST EXCISIONAL BIOPSY Left    BREAST  SURGERY     COLONOSCOPY  03/2022   WISDOM TOOTH EXTRACTION     Social History   Social History Narrative   Not on file   Immunization History  Administered Date(s) Administered   Influenza,inj,Quad PF,6+ Mos 07/23/2021, 09/08/2022   PFIZER(Purple Top)SARS-COV-2 Vaccination 12/29/2019, 01/23/2020, 08/02/2020   Pfizer Covid-19 Vaccine Bivalent Booster 50yrs & up 07/05/2021   Pneumococcal Polysaccharide-23 08/20/2021   Tdap 08/20/2021     Objective: Vital Signs: BP 124/79 (BP Location: Left Arm, Patient Position:  Sitting, Cuff Size: Normal)   Pulse 71   Ht 5\' 8"  (1.727 m)   Wt 203 lb 12.8 oz (92.4 kg)   BMI 30.99 kg/m    Physical Exam Vitals and nursing note reviewed.  Constitutional:      Appearance: Patricia Krause is well-developed.  HENT:     Head: Normocephalic and atraumatic.  Eyes:     Conjunctiva/sclera: Conjunctivae normal.  Cardiovascular:     Rate and Rhythm: Normal rate and regular rhythm.     Heart sounds: Normal heart sounds.  Pulmonary:     Effort: Pulmonary effort is normal.     Breath sounds: Normal breath sounds.  Abdominal:     General: Bowel sounds are normal.     Palpations: Abdomen is soft.  Musculoskeletal:     Cervical back: Normal range of motion.  Lymphadenopathy:     Cervical: No cervical adenopathy.  Skin:    General: Skin is warm and dry.     Capillary Refill: Capillary refill takes 2 to 3 seconds.     Comments: Nailbed capillary changes with dilated blood vessels was noted.  Sclerodactyly was noted.  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  Poikiloderma was noted.  Neurological:     Mental Status: Patricia Krause is alert and oriented to person, place, and time.  Psychiatric:        Behavior: Behavior normal.      Musculoskeletal Exam: Patricia Krause had limited lateral rotation of the cervical spine.  Significant thoracic kyphosis was noted.  There was no tenderness over thoracic or lumbar spine.  Shoulder joint abduction was limited to about 140 degrees bilaterally.  Patricia Krause had bilateral elbow joint contractures with no synovitis.  Patricia Krause had good range of motion of her wrist joints with no MCP PIP or DIP synovitis.  Patricia Krause had incomplete fist formation due to skin tightness.  Loss of digital tuft was noted without any digital ulceration.  Hip joints were in good range of motion.  Knee joints were in good range of motion.  Bilateral lymphedema was noted.  CDAI Exam: CDAI Score: -- Patient Global: --; Provider Global: -- Swollen: --; Tender: -- Joint Exam 02/09/2023   No joint exam  has been documented for this visit   There is currently no information documented on the homunculus. Go to the Rheumatology activity and complete the homunculus joint exam.  Investigation: No additional findings.  Imaging: No results found.  Recent Labs: Lab Results  Component Value Date   WBC 5.9 11/06/2022   HGB 12.4 11/06/2022   PLT 307 11/06/2022   NA 137 11/06/2022   K 4.1 11/06/2022   CL 107 11/06/2022   CO2 21 11/06/2022   GLUCOSE 118 (H) 11/06/2022   BUN 14 11/06/2022   CREATININE 0.50 11/06/2022   BILITOT 0.3 11/06/2022   ALKPHOS 71 09/08/2022   AST 30 11/06/2022   ALT 23 11/06/2022   PROT 7.9 11/06/2022   ALBUMIN 4.4 09/08/2022   CALCIUM 9.1 11/06/2022  GFRAA >60 12/28/2019   QFTBGOLDPLUS NEGATIVE 11/06/2021    Speciality Comments: Dx in 2005-presented postpartum '04. Left bicep muscle bx 2005 + skin bx+. IVIG in the past.Ttd with MTX, PLQ and MMF. Off all meds since 2018  Procedures:  No procedures performed Allergies: Prunus persica, Cat hair extract, Mixed ragweed, and Pollinex-t [modified tree tyrosine adsorbate]   Assessment / Plan:     Visit Diagnoses: Adult onset dermatomyositis (HCC) - Dx in 2005-presented postpartum '04. Left bicep muscle bx 2005 + skin bx+. IVIG, muscle wasting, poikiloderma, sclerodactyly, calcinosis, capillary changes: Patient denies any increased muscular weakness or tenderness.  Patricia Krause had no difficulty getting out of the chair.  Patricia Krause denies any shortness of breath, dysphagia or dysphonia.  Patricia Krause states Raynauds symptoms are mild now.  No digital ulcers were noted.  Patricia Krause has not noticed any progression of sclerodactyly or skin tightness all over.  Colonoscopy was last year.  Endoscopy is pending.  Patient will schedule endoscopy.  Need for rescheduling endoscopy was emphasized.  Increased risk of malignancy with dermatomyositis was discussed.  Patient states Patricia Krause gets mammogram and Pap smear on a regular basis.  High risk medication use -  Methotrexate 8 tablets by mouth once weekly and folic acid 2 mg daily-gap in therapy from 2018 until restarting in February 2023.  November 06, 2022 CBC and CMP were normal.  CK was 376.  I will check labs today including CK and sedimentation rate.  Information on immunization was placed in the AVS.  Patricia Krause was advised to hold methotrexate if Patricia Krause develops an infection.  Scleroderma (HCC) - Serology was negative for scleroderma, although her clinical features are consistent with scleroderma: Sclerodactyly, Telengectesia, Raynaud's, gastroesophageal reflux.  Patient states reflux symptoms are mild.  Endoscopy is pending.  Patricia Krause has an appointment coming up with the pulmonologist.  Patricia Krause will schedule appointment with the cardiologist and gastroenterologist.  Raynaud's syndrome without gangrene -Raynauds symptoms are mild.  No digital ulcers were noted..  Patricia Krause is on amlodipine 5 mg 1 tablet daily.  Shortness of breath - PFTs performed on 03/26/2022.  Patient will follow-up with the pulmonologist.  Echocardiogram is pending with the cardiology appointment.  Pain in both hands -Patricia Krause continues to have some stiffness due to sclerodactyly.  No synovitis was noted.  Unremarkable x-rays of both hands on 11/06/21.  No erosive changes noted.  Pedal edema - Severe bilateral LE lymphedema noted. Establishing care at a lymphedema clinic.  Essential hypertension-blood pressure was well at 124/79.  Vitamin D deficiency-Patricia Krause is on vitamin D 50,000 units weekly.  Vitamin D was 42.5 on September 08, 2022.  Other medical problems are listed as follows:  History of asthma  Other fatigue-patient is currently not working.  Patricia Krause is looking for part-time opportunities due to her health issues.  Prediabetes  Orders: Orders Placed This Encounter  Procedures   CBC with Differential/Platelet   COMPLETE METABOLIC PANEL WITH GFR   CK   Sedimentation rate   No orders of the defined types were placed in this  encounter.    Follow-Up Instructions: Return in about 3 months (around 05/11/2023) for Dermatomyositis.   Pollyann Savoy, MD  Note - This record has been created using Animal nutritionist.  Chart creation errors have been sought, but may not always  have been located. Such creation errors do not reflect on  the standard of medical care.

## 2023-02-05 DIAGNOSIS — F4320 Adjustment disorder, unspecified: Secondary | ICD-10-CM | POA: Diagnosis not present

## 2023-02-05 DIAGNOSIS — F4310 Post-traumatic stress disorder, unspecified: Secondary | ICD-10-CM | POA: Diagnosis not present

## 2023-02-09 ENCOUNTER — Ambulatory Visit: Payer: BC Managed Care – PPO | Attending: Physician Assistant | Admitting: Rheumatology

## 2023-02-09 ENCOUNTER — Encounter: Payer: Self-pay | Admitting: Rheumatology

## 2023-02-09 VITALS — BP 124/79 | HR 71 | Ht 68.0 in | Wt 203.8 lb

## 2023-02-09 DIAGNOSIS — Z8709 Personal history of other diseases of the respiratory system: Secondary | ICD-10-CM

## 2023-02-09 DIAGNOSIS — E559 Vitamin D deficiency, unspecified: Secondary | ICD-10-CM

## 2023-02-09 DIAGNOSIS — R0602 Shortness of breath: Secondary | ICD-10-CM

## 2023-02-09 DIAGNOSIS — M3310 Other dermatopolymyositis, organ involvement unspecified: Secondary | ICD-10-CM

## 2023-02-09 DIAGNOSIS — M349 Systemic sclerosis, unspecified: Secondary | ICD-10-CM | POA: Diagnosis not present

## 2023-02-09 DIAGNOSIS — M79641 Pain in right hand: Secondary | ICD-10-CM

## 2023-02-09 DIAGNOSIS — Z79899 Other long term (current) drug therapy: Secondary | ICD-10-CM

## 2023-02-09 DIAGNOSIS — R7303 Prediabetes: Secondary | ICD-10-CM

## 2023-02-09 DIAGNOSIS — I7300 Raynaud's syndrome without gangrene: Secondary | ICD-10-CM

## 2023-02-09 DIAGNOSIS — R5383 Other fatigue: Secondary | ICD-10-CM

## 2023-02-09 DIAGNOSIS — I10 Essential (primary) hypertension: Secondary | ICD-10-CM

## 2023-02-09 DIAGNOSIS — R600 Localized edema: Secondary | ICD-10-CM

## 2023-02-09 DIAGNOSIS — M79642 Pain in left hand: Secondary | ICD-10-CM

## 2023-02-09 NOTE — Patient Instructions (Signed)
Standing Labs We placed an order today for your standing lab work.   Please have your standing labs drawn in July and every 3 months  Please have your labs drawn 2 weeks prior to your appointment so that the provider can discuss your lab results at your appointment, if possible.  Please note that you may see your imaging and lab results in MyChart before we have reviewed them. We will contact you once all results are reviewed. Please allow our office up to 72 hours to thoroughly review all of the results before contacting the office for clarification of your results.  WALK-IN LAB HOURS  Monday through Thursday from 8:00 am -12:30 pm and 1:00 pm-5:00 pm and Friday from 8:00 am-12:00 pm.  Patients with office visits requiring labs will be seen before walk-in labs.  You may encounter longer than normal wait times. Please allow additional time. Wait times may be shorter on  Monday and Thursday afternoons.  We do not book appointments for walk-in labs. We appreciate your patience and understanding with our staff.   Labs are drawn by [facility]. Please bring your co-pay at the time of your lab draw.  You may receive a bill from [facility] for your lab work.  Please note if you are on Hydroxychloroquine and and an order has been placed for a Hydroxychloroquine level,  you will need to have it drawn 4 hours or more after your last dose.  If you wish to have your labs drawn at another location, please call the office 24 hours in advance so we can fax the orders.  The office is located at [address], [address], [place], [place] [address]   If you have any questions regarding directions or hours of operation,  please call [phone].   As a reminder, please drink plenty of water prior to coming for your lab work. Thanks!   Vaccines You are taking a medication(s) that can suppress your immune system.  The following immunizations are recommended: Flu annually Covid-19  Td/Tdap (tetanus, diphtheria,  pertussis) every 10 years Pneumonia (Prevnar 15 then Pneumovax 23 at least 1 year apart.  Alternatively, can take Prevnar 20 without needing additional dose) Shingrix: 2 doses from 4 weeks to 6 months apart  Please check with your PCP to make sure you are up to date.   If you have signs or symptoms of an infection or start antibiotics: First, call your PCP for workup of your infection. Hold your medication through the infection, until you complete your antibiotics, and until symptoms resolve if you take the following: Injectable medication (Actemra, Benlysta, Cimzia, Cosentyx, Enbrel, Humira, Kevzara, Orencia, Remicade, Simponi, Stelara, Taltz, Tremfya) Methotrexate Leflunomide (Arava) Mycophenolate (Cellcept) Xeljanz, Olumiant, or Rinvoq  

## 2023-02-10 LAB — SEDIMENTATION RATE: Sed Rate: 19 mm/h (ref 0–20)

## 2023-02-10 LAB — COMPLETE METABOLIC PANEL WITH GFR
AG Ratio: 1.3 (calc) (ref 1.0–2.5)
ALT: 17 U/L (ref 6–29)
AST: 22 U/L (ref 10–35)
Albumin: 4.2 g/dL (ref 3.6–5.1)
Alkaline phosphatase (APISO): 47 U/L (ref 31–125)
BUN/Creatinine Ratio: 32 (calc) — ABNORMAL HIGH (ref 6–22)
BUN: 13 mg/dL (ref 7–25)
CO2: 28 mmol/L (ref 20–32)
Calcium: 9.4 mg/dL (ref 8.6–10.2)
Chloride: 104 mmol/L (ref 98–110)
Creat: 0.41 mg/dL — ABNORMAL LOW (ref 0.50–0.99)
Globulin: 3.3 g/dL (calc) (ref 1.9–3.7)
Glucose, Bld: 162 mg/dL — ABNORMAL HIGH (ref 65–99)
Potassium: 3.7 mmol/L (ref 3.5–5.3)
Sodium: 138 mmol/L (ref 135–146)
Total Bilirubin: 0.5 mg/dL (ref 0.2–1.2)
Total Protein: 7.5 g/dL (ref 6.1–8.1)
eGFR: 124 mL/min/{1.73_m2} (ref >=60)

## 2023-02-10 LAB — CBC WITH DIFFERENTIAL/PLATELET
Absolute Monocytes: 432 cells/uL (ref 200–950)
Basophils Absolute: 21 cells/uL (ref 0–200)
Basophils Relative: 0.4 %
Eosinophils Absolute: 192 cells/uL (ref 15–500)
Eosinophils Relative: 3.7 %
HCT: 34.3 % — ABNORMAL LOW (ref 35.0–45.0)
Hemoglobin: 12 g/dL (ref 11.7–15.5)
Lymphs Abs: 1851 cells/uL (ref 850–3900)
MCH: 33.3 pg — ABNORMAL HIGH (ref 27.0–33.0)
MCHC: 35 g/dL (ref 32.0–36.0)
MCV: 95.3 fL (ref 80.0–100.0)
MPV: 9.7 fL (ref 7.5–12.5)
Monocytes Relative: 8.3 %
Neutro Abs: 2704 cells/uL (ref 1500–7800)
Neutrophils Relative %: 52 %
Platelets: 265 10*3/uL (ref 140–400)
RBC: 3.6 10*6/uL — ABNORMAL LOW (ref 3.80–5.10)
RDW: 11.8 % (ref 11.0–15.0)
Total Lymphocyte: 35.6 %
WBC: 5.2 10*3/uL (ref 3.8–10.8)

## 2023-02-10 LAB — CK: Total CK: 296 U/L — ABNORMAL HIGH (ref 29–143)

## 2023-02-10 NOTE — Progress Notes (Signed)
Sed rate normal, CK296 and stable, CMP shows elevated glucose at 162 CBC is stable.  Please notify patient of elevated glucose and forward results to her PCP.

## 2023-02-11 ENCOUNTER — Encounter: Payer: Self-pay | Admitting: Nurse Practitioner

## 2023-02-11 DIAGNOSIS — F4310 Post-traumatic stress disorder, unspecified: Secondary | ICD-10-CM | POA: Diagnosis not present

## 2023-02-11 DIAGNOSIS — F4320 Adjustment disorder, unspecified: Secondary | ICD-10-CM | POA: Diagnosis not present

## 2023-02-18 ENCOUNTER — Ambulatory Visit
Admission: RE | Admit: 2023-02-18 | Discharge: 2023-02-18 | Disposition: A | Discharge status: Home / Self Care | Payer: BC Managed Care – PPO | Source: Ambulatory Visit | Attending: Nurse Practitioner | Admitting: Nurse Practitioner

## 2023-02-18 DIAGNOSIS — Z1231 Encounter for screening mammogram for malignant neoplasm of breast: Secondary | ICD-10-CM | POA: Diagnosis not present

## 2023-02-18 DIAGNOSIS — Z0000 Encounter for general adult medical examination without abnormal findings: Secondary | ICD-10-CM

## 2023-02-19 DIAGNOSIS — F4320 Adjustment disorder, unspecified: Secondary | ICD-10-CM | POA: Diagnosis not present

## 2023-02-19 DIAGNOSIS — F4310 Post-traumatic stress disorder, unspecified: Secondary | ICD-10-CM | POA: Diagnosis not present

## 2023-02-24 ENCOUNTER — Ambulatory Visit: Payer: Self-pay | Admitting: Nurse Practitioner

## 2023-02-25 ENCOUNTER — Encounter: Payer: Self-pay | Admitting: Nurse Practitioner

## 2023-02-25 ENCOUNTER — Ambulatory Visit (INDEPENDENT_AMBULATORY_CARE_PROVIDER_SITE_OTHER): Payer: BC Managed Care – PPO | Admitting: Nurse Practitioner

## 2023-02-25 VITALS — BP 120/60 | HR 88 | Temp 98.6°F | Ht 66.0 in | Wt 197.6 lb

## 2023-02-25 DIAGNOSIS — L930 Discoid lupus erythematosus: Secondary | ICD-10-CM

## 2023-02-25 DIAGNOSIS — E786 Lipoprotein deficiency: Secondary | ICD-10-CM | POA: Diagnosis not present

## 2023-02-25 DIAGNOSIS — Z6831 Body mass index (BMI) 31.0-31.9, adult: Secondary | ICD-10-CM

## 2023-02-25 DIAGNOSIS — I10 Essential (primary) hypertension: Secondary | ICD-10-CM | POA: Diagnosis not present

## 2023-02-25 DIAGNOSIS — I890 Lymphedema, not elsewhere classified: Secondary | ICD-10-CM

## 2023-02-25 DIAGNOSIS — E6609 Other obesity due to excess calories: Secondary | ICD-10-CM

## 2023-02-25 DIAGNOSIS — R7303 Prediabetes: Secondary | ICD-10-CM | POA: Diagnosis not present

## 2023-02-25 DIAGNOSIS — E66811 Obesity, class 1: Secondary | ICD-10-CM | POA: Insufficient documentation

## 2023-02-25 DIAGNOSIS — E559 Vitamin D deficiency, unspecified: Secondary | ICD-10-CM | POA: Diagnosis not present

## 2023-02-25 NOTE — Progress Notes (Signed)
Hershal Coria Martin,acting as a Neurosurgeon for Arnette Felts, FNP.,have documented all relevant documentation on the behalf of Arnette Felts, FNP,as directed by  Arnette Felts, FNP while in the presence of Arnette Felts, FNP.    Subjective:     Patient ID: Patricia Krause , female    DOB: Apr 12, 1976 , 47 y.o.   MRN: [id number]   Chief Complaint  Patient presents with   Hypertension   Diabetes    HPI  Patient presents for a DM and BP check. Patient reports compliance with medications and has no other concerns today. 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 She has seen her Rheumatologist and had mammogram. She is due to see her Dentist soon. She is awaiting an appt with Pulmonology  Wt Readings from Last 3 Encounters: 02/25/23 : 197 lb 9.6 oz (89.6 kg) 02/09/23 : 203 lb 12.8 oz (92.4 kg) 01/05/23 : 206 lb (93.4 kg)       Past Medical History:  Diagnosis Date   Asthma    Dermatolysis    Hypertension    Lymphedema    dx by vascular doctor   Pre-diabetes      Family History  Problem Relation Age of Onset   Diabetes Father    Cancer Father        prostate cancer in her 58s.   Cancer Paternal Aunt        uncertain what type of cancer   Diabetes Paternal Grandmother    Alzheimer's disease Paternal Grandfather    High Cholesterol Brother    Diabetes Son    Autism Son    Colon cancer Neg Hx    Breast cancer Neg Hx      Current Outpatient Medications:    albuterol (VENTOLIN HFA) 108 (90 Base) MCG/ACT inhaler, Inhale 2 puffs into the lungs every 6 (six) hours as needed for wheezing or shortness of breath., Disp: 8 g, Rfl: 2   chlorhexidine (PERIDEX) 0.12 % solution, Use as directed 5 mLs in the mouth or throat daily., Disp: , Rfl:    fluticasone (FLONASE) 50 MCG/ACT nasal spray, Place 2 sprays into both nostrils daily., Disp: 18.2 mL, Rfl: 2   folic acid (FOLVITE) 1 MG tablet, TAKE 2 TABLETS(2 MG) BY MOUTH DAILY, Disp: 180 tablet, Rfl: 3   hydrocortisone  (ANUSOL-HC) 2.5 % rectal cream, Place 1 Application rectally 2 (two) times daily., Disp: 30 g, Rfl: 1   hydrOXYzine (ATARAX) 25 MG tablet, Take 1 tablet (25 mg total) by mouth 3 (three) times daily as needed., Disp: 270 tablet, Rfl: 1   methotrexate (RHEUMATREX) 2.5 MG tablet, TAKE 8 TABLETS ONCE WEEKLY, Disp: 96 tablet, Rfl: 0   mupirocin ointment (BACTROBAN) 2 %, Apply 1 application topically 2 (two) times daily., Disp: 30 g, Rfl: 3   Phentermine-Topiramate (QSYMIA) 11.25-69 MG CP24, Take 1 tablet by mouth daily., Disp: 90 capsule, Rfl: 0   Vitamin D, Ergocalciferol, (DRISDOL) 1.25 MG (50000 UNIT) CAPS capsule, Take 1 capsule (50,000 Units total) by mouth every 7 (seven) days., Disp: 24 capsule, Rfl: 1   amLODipine (NORVASC) 5 MG tablet, Take 1 tablet (5 mg total) by mouth daily., Disp: 90 tablet, Rfl: 1   Allergies  Allergen Reactions   Prunus Persica Swelling    Peaches, plums, nectarines.    Cat Hair Extract Hives and Itching   Mixed Ragweed Other (See Comments)   Pollinex-T [Modified Tree Tyrosine Adsorbate] Other (See Comments)     Review of  Systems  Constitutional: Negative.   Eyes: Negative.   Respiratory: Negative.    Cardiovascular: Negative.   Endocrine: Negative for polydipsia, polyphagia and polyuria.  Musculoskeletal: Negative.   Skin: Negative.   Neurological: Negative.   Psychiatric/Behavioral: Negative.       Today's Vitals   02/25/23 1552  BP: 120/60  Pulse: 88  Temp: 98.6 F (37 C)  Weight: 197 lb 9.6 oz (89.6 kg)  Height: 5\' 6"  (1.676 m)  PainSc: 0-No pain   Body mass index is 31.89 kg/m.  The 10-year ASCVD risk score (Arnett DK, et al., 2019) is: 1.2%   Values used to calculate the score:     Age: 28 years     Sex: Female     Is Non-Hispanic African American: No     Diabetic: No     Tobacco smoker: No     Systolic Blood Pressure: 120 mmHg     Is BP treated: Yes     HDL Cholesterol: 41 mg/dL     Total Cholesterol: 172 mg/dL  Objective:   Physical Exam Vitals reviewed.  Constitutional:      General: She is not in acute distress.    Appearance: Normal appearance. She is obese.  Cardiovascular:     Rate and Rhythm: Normal rate and regular rhythm.     Pulses: Normal pulses.     Heart sounds: Normal heart sounds. No murmur heard. Pulmonary:     Effort: Pulmonary effort is normal. No respiratory distress.     Breath sounds: Normal breath sounds. No wheezing.  Musculoskeletal:     Right lower leg: Edema (lymphedema) present.     Left lower leg: Edema (lymphedema) present.  Neurological:     General: No focal deficit present.     Mental Status: She is alert and oriented to person, place, and time.     Cranial Nerves: No cranial nerve deficit.     Motor: No weakness.  Psychiatric:        Mood and Affect: Mood normal.        Behavior: Behavior normal.        Thought Content: Thought content normal.        Judgment: Judgment normal.         Assessment And Plan:     1. Essential hypertension Comments: Blood pressure is well-controlled.  Continue current medications  2. Prediabetes Comments: Hemoglobin A1c has been stable.  Will check heme, A1c today.  Continue focusing on diet low in sugar and carbs. - Hemoglobin A1c  3. Vitamin D deficiency Will check vitamin D level and supplement as needed.    Also encouraged to spend 15 minutes in the sun daily.  - VITAMIN D 25 Hydroxy (Vit-D Deficiency, Fractures)  4. Low HDL (under 40) - Lipid panel  5. Lymphedema Comments: No significant changes  6. Discoid lupus Comments: Continue f/u with Rheumatology  7. Class 1 obesity due to excess calories without serious comorbidity with body mass index (BMI) of 31.0 to 31.9 in adult She is encouraged to strive for BMI less than 30 to decrease cardiac risk. Advised to aim for at least 150 minutes of exercise per week.    Return for keep next appt as scheduled .   Patient was given opportunity to ask questions. Patient  verbalized understanding of the plan and was able to repeat key elements of the plan. All questions were answered to their satisfaction.  Arnette Felts, FNP   I, Arnette Felts, FNP, have  reviewed all documentation for this visit. The documentation on 02/25/23 for the exam, diagnosis, procedures, and orders are all accurate and complete.   IF YOU HAVE BEEN REFERRED TO A SPECIALIST, IT MAY TAKE 1-2 WEEKS TO SCHEDULE/PROCESS THE REFERRAL. IF YOU HAVE NOT HEARD FROM US/SPECIALIST IN TWO WEEKS, PLEASE GIVE [place] A CALL AT [phone].   THE PATIENT IS ENCOURAGED TO PRACTICE SOCIAL DISTANCING DUE TO THE COVID-19 PANDEMIC.

## 2023-02-25 NOTE — Patient Instructions (Signed)
Hypertension, Adult ?Hypertension is another name for high blood pressure. High blood pressure forces your heart to work harder to pump blood. This can cause problems over time. ?There are two numbers in a blood pressure reading. There is a top number (systolic) over a bottom number (diastolic). It is best to have a blood pressure that is below 120/80. ?What are the causes? ?The cause of this condition is not known. Some other conditions can lead to high blood pressure. ?What increases the risk? ?Some lifestyle factors can make you more likely to develop high blood pressure: ?Smoking. ?Not getting enough exercise or physical activity. ?Being overweight. ?Having too much fat, sugar, calories, or salt (sodium) in your diet. ?Drinking too much alcohol. ?Other risk factors include: ?Having any of these conditions: ?Heart disease. ?Diabetes. ?High cholesterol. ?Kidney disease. ?Obstructive sleep apnea. ?Having a family history of high blood pressure and high cholesterol. ?Age. The risk increases with age. ?Stress. ?What are the signs or symptoms? ?High blood pressure may not cause symptoms. Very high blood pressure (hypertensive crisis) may cause: ?Headache. ?Fast or uneven heartbeats (palpitations). ?Shortness of breath. ?Nosebleed. ?Vomiting or feeling like you may vomit (nauseous). ?Changes in how you see. ?Very bad chest pain. ?Feeling dizzy. ?Seizures. ?How is this treated? ?This condition is treated by making healthy lifestyle changes, such as: ?Eating healthy foods. ?Exercising more. ?Drinking less alcohol. ?Your doctor may prescribe medicine if lifestyle changes do not help enough and if: ?Your top number is above 130. ?Your bottom number is above 80. ?Your personal target blood pressure may vary. ?Follow these instructions at home: ?Eating and drinking ? ?If told, follow the DASH eating plan. To follow this plan: ?Fill one half of your plate at each meal with fruits and vegetables. ?Fill one fourth of your plate  at each meal with whole grains. Whole grains include whole-wheat pasta, brown rice, and whole-grain bread. ?Eat or drink low-fat dairy products, such as skim milk or low-fat yogurt. ?Fill one fourth of your plate at each meal with low-fat (lean) proteins. Low-fat proteins include fish, chicken without skin, eggs, beans, and tofu. ?Avoid fatty meat, cured and processed meat, or chicken with skin. ?Avoid pre-made or processed food. ?Limit the amount of salt in your diet to less than 1,500 mg each day. ?Do not drink alcohol if: ?Your doctor tells you not to drink. ?You are pregnant, may be pregnant, or are planning to become pregnant. ?If you drink alcohol: ?Limit how much you have to: ?0-1 drink a day for women. ?0-2 drinks a day for men. ?Know how much alcohol is in your drink. In the [place] one drink equals one 12 oz bottle of beer (355 mL), one 5 oz glass of wine (148 mL), or one 1? oz glass of hard liquor (44 mL). ?Lifestyle ? ?Work with your doctor to stay at a healthy weight or to lose weight. Ask your doctor what the best weight is for you. ?Get at least 30 minutes of exercise that causes your heart to beat faster (aerobic exercise) most days of the week. This may include walking, swimming, or biking. ?Get at least 30 minutes of exercise that strengthens your muscles (resistance exercise) at least 3 days a week. This may include lifting weights or doing Pilates. ?Do not smoke or use any products that contain nicotine or tobacco. If you need help quitting, ask your doctor. ?Check your blood pressure at home as told by your doctor. ?Keep all follow-up visits. ?Medicines ?Take over-the-counter and prescription medicines   only as told by your doctor. Follow directions carefully. ?Do not skip doses of blood pressure medicine. The medicine does not work as well if you skip doses. Skipping doses also puts you at risk for problems. ?Ask your doctor about side effects or reactions to medicines that you should watch  for. ?Contact a doctor if: ?You think you are having a reaction to the medicine you are taking. ?You have headaches that keep coming back. ?You feel dizzy. ?You have swelling in your ankles. ?You have trouble with your vision. ?Get help right away if: ?You get a very bad headache. ?You start to feel mixed up (confused). ?You feel weak or numb. ?You feel faint. ?You have very bad pain in your: ?Chest. ?Belly (abdomen). ?You vomit more than once. ?You have trouble breathing. ?These symptoms may be an emergency. Get help right away. Call 911. ?Do not wait to see if the symptoms will go away. ?Do not drive yourself to the hospital. ?Summary ?Hypertension is another name for high blood pressure. ?High blood pressure forces your heart to work harder to pump blood. ?For most people, a normal blood pressure is less than 120/80. ?Making healthy choices can help lower blood pressure. If your blood pressure does not get lower with healthy choices, you may need to take medicine. ?This information is not intended to replace advice given to you by your health care provider. Make sure you discuss any questions you have with your health care provider. ?Document Revised: 07/17/2021 Document Reviewed: 07/17/2021 ?Elsevier Patient Education ? 2023 Elsevier Inc. ? ?

## 2023-02-26 ENCOUNTER — Encounter: Payer: Self-pay | Admitting: Rheumatology

## 2023-02-26 ENCOUNTER — Other Ambulatory Visit: Payer: Self-pay

## 2023-02-26 LAB — HEMOGLOBIN A1C
Est. average glucose Bld gHb Est-mCnc: 117 mg/dL
Hgb A1c MFr Bld: 5.7 % — ABNORMAL HIGH (ref 4.8–5.6)

## 2023-02-26 LAB — LIPID PANEL
Chol/HDL Ratio: 4.2 ratio (ref 0.0–4.4)
Cholesterol, Total: 172 mg/dL (ref 100–199)
HDL: 41 mg/dL (ref >39)
LDL Chol Calc (NIH): 108 mg/dL — ABNORMAL HIGH (ref 0–99)
Triglycerides: 128 mg/dL (ref 0–149)
VLDL Cholesterol Cal: 23 mg/dL (ref 5–40)

## 2023-02-26 LAB — VITAMIN D 25 HYDROXY (VIT D DEFICIENCY, FRACTURES): Vit D, 25-Hydroxy: 37 ng/mL (ref 30.0–100.0)

## 2023-02-26 MED ORDER — AMLODIPINE BESYLATE 5 MG PO TABS: 5.0000 mg | ORAL_TABLET | Freq: Every day | ORAL | 1 refills | Status: DC

## 2023-03-03 DIAGNOSIS — F4310 Post-traumatic stress disorder, unspecified: Secondary | ICD-10-CM | POA: Diagnosis not present

## 2023-03-03 DIAGNOSIS — F4320 Adjustment disorder, unspecified: Secondary | ICD-10-CM | POA: Diagnosis not present

## 2023-03-12 DIAGNOSIS — F4310 Post-traumatic stress disorder, unspecified: Secondary | ICD-10-CM | POA: Diagnosis not present

## 2023-03-12 DIAGNOSIS — F4320 Adjustment disorder, unspecified: Secondary | ICD-10-CM | POA: Diagnosis not present

## 2023-03-19 DIAGNOSIS — F4310 Post-traumatic stress disorder, unspecified: Secondary | ICD-10-CM | POA: Diagnosis not present

## 2023-03-19 DIAGNOSIS — F4320 Adjustment disorder, unspecified: Secondary | ICD-10-CM | POA: Diagnosis not present

## 2023-03-23 DIAGNOSIS — F4320 Adjustment disorder, unspecified: Secondary | ICD-10-CM | POA: Diagnosis not present

## 2023-03-23 DIAGNOSIS — F4310 Post-traumatic stress disorder, unspecified: Secondary | ICD-10-CM | POA: Diagnosis not present

## 2023-03-24 ENCOUNTER — Encounter: Payer: Self-pay | Admitting: Student

## 2023-04-01 DIAGNOSIS — F4320 Adjustment disorder, unspecified: Secondary | ICD-10-CM | POA: Diagnosis not present

## 2023-04-01 DIAGNOSIS — F4310 Post-traumatic stress disorder, unspecified: Secondary | ICD-10-CM | POA: Diagnosis not present

## 2023-04-08 DIAGNOSIS — F4310 Post-traumatic stress disorder, unspecified: Secondary | ICD-10-CM | POA: Diagnosis not present

## 2023-04-08 DIAGNOSIS — F4320 Adjustment disorder, unspecified: Secondary | ICD-10-CM | POA: Diagnosis not present

## 2023-04-12 ENCOUNTER — Encounter: Payer: Self-pay | Admitting: Rheumatology

## 2023-04-12 ENCOUNTER — Encounter: Payer: Self-pay | Admitting: Nurse Practitioner

## 2023-04-12 ENCOUNTER — Other Ambulatory Visit: Payer: Self-pay

## 2023-04-12 DIAGNOSIS — M349 Systemic sclerosis, unspecified: Secondary | ICD-10-CM

## 2023-04-12 DIAGNOSIS — M3310 Other dermatopolymyositis, organ involvement unspecified: Secondary | ICD-10-CM

## 2023-04-12 DIAGNOSIS — Z79899 Other long term (current) drug therapy: Secondary | ICD-10-CM

## 2023-04-12 MED ORDER — HYDROXYZINE HCL 25 MG PO TABS: 25.0000 mg | ORAL_TABLET | Freq: Three times a day (TID) | ORAL | 1 refills | Status: DC | PRN

## 2023-04-12 MED ORDER — AMLODIPINE BESYLATE 5 MG PO TABS: 5.0000 mg | ORAL_TABLET | Freq: Every day | ORAL | 1 refills | Status: DC

## 2023-04-12 MED ORDER — FOLIC ACID 1 MG PO TABS
ORAL_TABLET | ORAL | 3 refills | Status: DC
Start: 2023-04-12 — End: 2024-06-27

## 2023-04-12 MED ORDER — METHOTREXATE SODIUM 2.5 MG PO TABS: ORAL_TABLET | ORAL | 0 refills | Status: DC

## 2023-04-12 MED ORDER — VITAMIN D (ERGOCALCIFEROL) 1.25 MG (50000 UNIT) PO CAPS: 50000.0000 [IU] | ORAL_CAPSULE | ORAL | 1 refills | Status: DC

## 2023-04-14 DIAGNOSIS — F4310 Post-traumatic stress disorder, unspecified: Secondary | ICD-10-CM | POA: Diagnosis not present

## 2023-04-14 DIAGNOSIS — F4320 Adjustment disorder, unspecified: Secondary | ICD-10-CM | POA: Diagnosis not present

## 2023-04-19 DIAGNOSIS — F4320 Adjustment disorder, unspecified: Secondary | ICD-10-CM | POA: Diagnosis not present

## 2023-04-19 DIAGNOSIS — F4310 Post-traumatic stress disorder, unspecified: Secondary | ICD-10-CM | POA: Diagnosis not present

## 2023-04-27 NOTE — Progress Notes (Unsigned)
Office Visit Note  Patient: Patricia Krause             Date of Birth: [birth year]           MRN: [id number]             PCP: Arnette Felts, FNP Referring: Arnette Felts, FNP Visit Date: 05/11/2023 Occupation: @GUAROCC @  Subjective:  Medication monitoring   History of Present Illness: Patricia Krause is a 47 y.o. female with history of dermatomyositis and scleroderma.  Patient remains on Methotrexate 8 tablets by mouth once weekly and folic acid 2 mg daily-gap in therapy from 2018 until restarting in February 2023.  She is tolerating methotrexate without any side effects.  She denies any increased muscular weakness.  She has had infrequent symptoms of Raynaud's phenomenon with the warmer weather temperatures.  She denies any joint swelling at this time.  She has not missed any doses of methotrexate recently.  She denies any dysphagia or any other new or worsening gastrointestinal symptoms.  Patient is planning on having an updated endoscopy at the end of the year.  She denies any increased shortness of breath or any new pulmonary symptoms.  Patient has upcoming appointment with cardiology on 06/10/2023 and is planning to proceed with an echocardiogram.  She also has a follow-up visit scheduled with pulmonology on 06/15/2023.   Activities of Daily Living:  Patient reports morning stiffness for 10 minutes.   Patient Denies nocturnal pain.  Difficulty dressing/grooming: Reports Difficulty climbing stairs: Reports Difficulty getting out of chair: Denies Difficulty using hands for taps, buttons, cutlery, and/or writing: Reports  Review of Systems  Constitutional:  Positive for fatigue.  HENT:  Negative for mouth sores and mouth dryness.   Eyes:  Negative for dryness.  Respiratory:  Negative for shortness of breath.   Cardiovascular:  Negative for chest pain and palpitations.  Gastrointestinal:  Negative for blood in stool, constipation and diarrhea.  Endocrine: Positive for increased  urination.  Genitourinary:  Negative for involuntary urination.  Musculoskeletal:  Positive for gait problem, muscle weakness and morning stiffness. Negative for joint pain, joint pain, joint swelling, myalgias, muscle tenderness and myalgias.  Skin:  Positive for sensitivity to sunlight. Negative for color change, rash and hair loss.  Allergic/Immunologic: Negative for susceptible to infections.  Neurological:  Positive for headaches. Negative for dizziness.  Hematological:  Negative for swollen glands.  Psychiatric/Behavioral:  Positive for depressed mood. Negative for sleep disturbance. The patient is nervous/anxious.     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   Cancer Paternal Aunt  uncertain what type of cancer   Diabetes Paternal Grandmother    Alzheimer's disease Paternal Grandfather    High Cholesterol Brother    Diabetes Son    Autism Son    Colon cancer Neg Hx    Breast cancer Neg Hx    Past Surgical History:  Procedure Laterality Date   BREAST EXCISIONAL BIOPSY Left    BREAST SURGERY     COLONOSCOPY  03/2022   WISDOM TOOTH EXTRACTION     Social History   Social History Narrative    Not on file   Immunization History  Administered Date(s) Administered   DT (Pediatric) 05/07/1991   DTP [birth year]78, 03/26/1978, 09/20/1978, 10/21/1979, 03/30/1985   Hep B, Unspecified 04/20/1996, 05/22/1996, 12/22/1996   Influenza Split 11/27/2010, 09/10/2011   Influenza,inj,Quad PF,6+ Mos 06/29/2013, 10/15/2014, 08/29/2015, 08/18/2016, 07/23/2021, 09/08/2022   Influenza-Unspecified 08/18/2005, 08/11/2006, 08/29/2007, 07/23/2009   MMR 05/30/1979, 07/07/1989, 06/10/1990   OPV 03/26/1978, 06/08/1978, 09/20/1978, 03/30/1985   PFIZER(Purple Top)SARS-COV-2 Vaccination 12/29/2019, 01/23/2020, 08/02/2020   PPD Test 05/30/1979, 04/19/1998   Pfizer Covid-19 Vaccine Bivalent Booster 30yrs & up 07/05/2021   Pneumococcal Polysaccharide-23 10/15/2014, 08/20/2021   Tdap 09/10/2011, 08/20/2021     Objective: Vital Signs: BP 121/74 (BP Location: Left Arm, Patient Position: Sitting, Cuff Size: Normal)   Pulse 80   Resp 15   Ht 5\' 8"  (1.727 m)   Wt 202 lb (91.6 kg)   BMI 30.71 kg/m    Physical Exam Vitals and nursing note reviewed.  Constitutional:      Appearance: She is well-developed.  HENT:     Head: Normocephalic and atraumatic.  Eyes:     Conjunctiva/sclera: Conjunctivae normal.  Cardiovascular:     Rate and Rhythm: Normal rate and regular rhythm.     Heart sounds: Normal heart sounds.  Pulmonary:     Effort: Pulmonary effort is normal.     Breath sounds: Normal breath sounds.  Abdominal:     General: Bowel sounds are normal.     Palpations: Abdomen is soft.  Musculoskeletal:     Cervical back: Normal range of motion.  Skin:    General: Skin is warm and dry.     Capillary Refill: Capillary refill takes 2 to 3 seconds.     Comments: Nailbed capillary changes with dilated blood vessels was noted.  Sclerodactyly was noted.  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  Poikiloderma was noted.  Neurological:     Mental Status: She is alert and oriented to person, place,  and time.  Psychiatric:        Behavior: Behavior normal.      Musculoskeletal Exam: C-spine has limited range of motion with lateral rotation.  Thoracic kyphosis noted.  Shoulder joint abduction limited to about 120 degrees bilaterally.  Bilateral elbow joint flexion contractures.  Wrist joints have good range of motion with no tenderness or synovitis.  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  Loss of digital tufts but no digital ulcerations noted. Knee joints have good ROM with no warmth or effusion. Severe lymphedema noted in bilateral lower extremities.  CDAI Exam: CDAI Score: -- Patient Global: --; Provider Global: -- Swollen: --; Tender: -- Joint Exam 05/11/2023   No joint exam has been documented for this visit   There is currently no information documented on the homunculus. Go to the Rheumatology activity and complete the homunculus joint exam.  Investigation: No additional findings.  Imaging: No results found.  Recent Labs: Lab Results  Component Value Date  WBC 5.2 02/09/2023   HGB 12.0 02/09/2023   PLT 265 02/09/2023   NA 138 02/09/2023   K 3.7 02/09/2023   CL 104 02/09/2023   CO2 28 02/09/2023   GLUCOSE 162 (H) 02/09/2023   BUN 13 02/09/2023   CREATININE 0.41 (L) 02/09/2023   BILITOT 0.5 02/09/2023   ALKPHOS 71 09/08/2022   AST 22 02/09/2023   ALT 17 02/09/2023   PROT 7.5 02/09/2023   ALBUMIN 4.4 09/08/2022   CALCIUM 9.4 02/09/2023   GFRAA >60 12/28/2019   QFTBGOLDPLUS NEGATIVE 11/06/2021    Speciality Comments: Dx in 2005-presented postpartum '04. Left bicep muscle bx 2005 + skin bx+. IVIG in the past.Ttd with MTX, PLQ and MMF. Off all meds since 2018  Procedures:  No procedures performed Allergies: Prunus persica, Cat hair extract, Mixed ragweed, and Pollinex-t [modified tree tyrosine adsorbate]     Assessment / Plan:     Visit Diagnoses: Adult onset dermatomyositis (HCC) - Dx in 2005-presented postpartum '04. Left bicep muscle bx 2005  + skin bx+. IVIG, muscle wasting, poikiloderma, sclerodactyly, calcinosis, capillary changes: She has not had any signs or symptoms of a dermatomyositis flare.  CK was 296 on 02/09/23-continues to trend down.  No muscular weakness noted on examination today.  She is clinically doing well taking methotrexate 8 tablets by mouth once weekly along with folic acid 2 mg daily.  She has not missed any doses of methotrexate recently.  Discussed the increased risk for malignancy in patients with dermatomyositis.  Discussed the importance of remaining up-to-date with all age related cancer screenings.  She has a follow-up visit scheduled with gynecology in December 2024--continues to have years and mammograms performed.  Colonoscopy updated on 04/07/2022. Patient will remain on methotrexate and folic acid as prescribed. CK will be rechecked today.  She was advised to notify us if she develops any signs or symptoms of a flare. - Plan: CK  High risk medication use - Methotrexate 8 tablets by mouth once weekly and folic acid 2 mg daily-gap in therapy from 2018 until restarting in February 2023.  CBC and CMP updated on 02/09/23. Orders for CBC and CMP released today. Her next lab work will be due at the end of October and every 3 months.  No recent or recurrent infections.  Discussed the importance of holding methotrexate if she develops signs or symptoms of an infection and to resume once the infection has completely cleared.  - Plan: COMPLETE METABOLIC PANEL WITH GFR, CBC with Differential/Platelet, CBC with Differential/Platelet, COMPLETE METABOLIC PANEL WITH GFR  Scleroderma (HCC) - Serology was negative for scleroderma, although her clinical features are consistent with scleroderma: She has not noticed any progression of sclerodactyly.  She has not had any frequent symptoms of Raynaud's phenomenon.  No digital ulcerations noted.  She remains on amlodipine 5 mg 1 tablet daily. Blood pressure was 121/74 today in the  office.  Discussed the importance of close blood pressure monitoring as well as the increased risk for developing renal crisis. She has not been experiencing any dysphagia or new gastrointestinal symptoms.  Planning to proceed with an endoscopy at the end of the year. She is not experiencing any new pulmonary symptoms.  She has a follow-up scheduled with pulmonology on 06/15/2023.  She is also scheduled to see the cardiologist on 06/10/2023 and is planning to proceed with an updated echocardiogram.  Raynaud's syndrome without gangrene -She remains on amlodipine 5 mg 1 tablet daily.  Shortness of breath - PFTs performed on  03/26/2022. No new or worsening pulmonary symptoms. Scheduled to see pulmonology on 06/15/2023.  Pain in both hands - Unremarkable x-rays of both hands on 11/06/21.  No erosive changes noted.  No synovitis noted on exam.   Other medical conditions are listed as follows:   Pedal edema - Severe bilateral LE lymphedema noted. Establishing care at a lymphedema clinic.  Essential hypertension: Blood pressure is 121/74 today in the office  History of asthma  Vitamin D deficiency: She is taking vitamin D 50,000 units once weekly.   Other fatigue: Stable.   Prediabetes  Orders: Orders Placed This Encounter  Procedures   COMPLETE METABOLIC PANEL WITH GFR   CBC with Differential/Platelet   CK   CBC with Differential/Platelet   COMPLETE METABOLIC PANEL WITH GFR   No orders of the defined types were placed in this encounter.   Follow-Up Instructions: Return in about 3 months (around 08/11/2023).   Gearldine Bienenstock, PA-C  Note - This record has been created using Dragon software.  Chart creation errors have been sought, but may not always  have been located. Such creation errors do not reflect on  the standard of medical care.

## 2023-04-30 DIAGNOSIS — F4310 Post-traumatic stress disorder, unspecified: Secondary | ICD-10-CM | POA: Diagnosis not present

## 2023-04-30 DIAGNOSIS — F4320 Adjustment disorder, unspecified: Secondary | ICD-10-CM | POA: Diagnosis not present

## 2023-05-11 ENCOUNTER — Ambulatory Visit: Payer: BC Managed Care – PPO | Attending: Physician Assistant | Admitting: Physician Assistant

## 2023-05-11 ENCOUNTER — Encounter: Payer: Self-pay | Admitting: Physician Assistant

## 2023-05-11 VITALS — BP 121/74 | HR 80 | Resp 15 | Ht 68.0 in | Wt 202.0 lb

## 2023-05-11 DIAGNOSIS — M79641 Pain in right hand: Secondary | ICD-10-CM

## 2023-05-11 DIAGNOSIS — M349 Systemic sclerosis, unspecified: Secondary | ICD-10-CM | POA: Diagnosis not present

## 2023-05-11 DIAGNOSIS — R0602 Shortness of breath: Secondary | ICD-10-CM | POA: Diagnosis not present

## 2023-05-11 DIAGNOSIS — M79642 Pain in left hand: Secondary | ICD-10-CM

## 2023-05-11 DIAGNOSIS — R7303 Prediabetes: Secondary | ICD-10-CM | POA: Diagnosis not present

## 2023-05-11 DIAGNOSIS — R5383 Other fatigue: Secondary | ICD-10-CM

## 2023-05-11 DIAGNOSIS — I7300 Raynaud's syndrome without gangrene: Secondary | ICD-10-CM

## 2023-05-11 DIAGNOSIS — Z79899 Other long term (current) drug therapy: Secondary | ICD-10-CM

## 2023-05-11 DIAGNOSIS — Z8709 Personal history of other diseases of the respiratory system: Secondary | ICD-10-CM | POA: Diagnosis not present

## 2023-05-11 DIAGNOSIS — M3310 Other dermatopolymyositis, organ involvement unspecified: Secondary | ICD-10-CM | POA: Diagnosis not present

## 2023-05-11 DIAGNOSIS — E559 Vitamin D deficiency, unspecified: Secondary | ICD-10-CM

## 2023-05-11 DIAGNOSIS — I10 Essential (primary) hypertension: Secondary | ICD-10-CM | POA: Diagnosis not present

## 2023-05-11 DIAGNOSIS — R600 Localized edema: Secondary | ICD-10-CM

## 2023-05-11 LAB — CBC WITH DIFFERENTIAL/PLATELET
Absolute Monocytes: 498 cells/uL (ref 200–950)
Basophils Absolute: 30 cells/uL (ref 0–200)
Basophils Relative: 0.5 %
Eosinophils Absolute: 318 cells/uL (ref 15–500)
Eosinophils Relative: 5.3 %
HCT: 37 % (ref 35.0–45.0)
Hemoglobin: 12.5 g/dL (ref 11.7–15.5)
Lymphs Abs: 1746 cells/uL (ref 850–3900)
MCH: 33 pg (ref 27.0–33.0)
MCHC: 33.8 g/dL (ref 32.0–36.0)
MCV: 97.6 fL (ref 80.0–100.0)
MPV: 10 fL (ref 7.5–12.5)
Monocytes Relative: 8.3 %
Neutro Abs: 3408 cells/uL (ref 1500–7800)
Neutrophils Relative %: 56.8 %
Platelets: 297 10*3/uL (ref 140–400)
RBC: 3.79 10*6/uL — ABNORMAL LOW (ref 3.80–5.10)
RDW: 12.1 % (ref 11.0–15.0)
Total Lymphocyte: 29.1 %
WBC: 6 10*3/uL (ref 3.8–10.8)

## 2023-05-12 DIAGNOSIS — F4310 Post-traumatic stress disorder, unspecified: Secondary | ICD-10-CM | POA: Diagnosis not present

## 2023-05-12 DIAGNOSIS — F4320 Adjustment disorder, unspecified: Secondary | ICD-10-CM | POA: Diagnosis not present

## 2023-05-12 NOTE — Progress Notes (Signed)
Glucose is 208. Creatinine is borderline low. Rest of CMP WNL.  RBC count is low but stable. Rest of CBC WNL. CK is elevated-417. Recommend rechecking in 1 month.

## 2023-05-13 ENCOUNTER — Encounter: Payer: Self-pay | Admitting: *Deleted

## 2023-05-19 DIAGNOSIS — F4320 Adjustment disorder, unspecified: Secondary | ICD-10-CM | POA: Diagnosis not present

## 2023-05-19 DIAGNOSIS — F4310 Post-traumatic stress disorder, unspecified: Secondary | ICD-10-CM | POA: Diagnosis not present

## 2023-05-25 DIAGNOSIS — F4310 Post-traumatic stress disorder, unspecified: Secondary | ICD-10-CM | POA: Diagnosis not present

## 2023-05-25 DIAGNOSIS — F4320 Adjustment disorder, unspecified: Secondary | ICD-10-CM | POA: Diagnosis not present

## 2023-06-01 DIAGNOSIS — F4320 Adjustment disorder, unspecified: Secondary | ICD-10-CM | POA: Diagnosis not present

## 2023-06-01 DIAGNOSIS — F4310 Post-traumatic stress disorder, unspecified: Secondary | ICD-10-CM | POA: Diagnosis not present

## 2023-06-09 DIAGNOSIS — F4320 Adjustment disorder, unspecified: Secondary | ICD-10-CM | POA: Diagnosis not present

## 2023-06-09 DIAGNOSIS — F4310 Post-traumatic stress disorder, unspecified: Secondary | ICD-10-CM | POA: Diagnosis not present

## 2023-06-09 NOTE — Progress Notes (Unsigned)
Cardiology Office Note   Date:  06/10/2023   ID:  Patricia Krause, DOB 06-11-1976, MRN [id number]  PCP:  Arnette Felts, FNP    No chief complaint on file.  DOE  [employer] Readings from Last 3 Encounters:  06/10/23 203 lb 12.8 oz (92.4 kg)  05/11/23 202 lb (91.6 kg)  02/25/23 197 lb 9.6 oz (89.6 kg)       History of Present Illness: Patricia Krause is a 47 y.o. female   who is being seen today for the evaluation of leg edema at the request of Patricia Ivory, NP   She moved from [place] in 2018.  She had leg ultrasounds and echocardiogram.  She thinks the heart eval was negative.    She was diagnosed with dermatomyositis in 2005.  She has had swelling in her legs for years.  It has progressively been more severe.    She has not tolerated compression stockings in the past.   January 2023 echo showed: "Normal LV function, normal valvular function.  Normal RV function.  No evidence of pulmonary hypertension. "  Still using compression pump for legs.   Denies : Chest pain. Dizziness. Nitroglycerin use. Orthopnea. Palpitations. Paroxysmal nocturnal dyspnea. Shortness of breath. Syncope.    Past Medical History:  Diagnosis Date   Asthma    Dermatolysis    Hypertension    Lymphedema    dx by vascular doctor   Pre-diabetes     Past Surgical History:  Procedure Laterality Date   BREAST EXCISIONAL BIOPSY Left    BREAST SURGERY     COLONOSCOPY  03/2022   WISDOM TOOTH EXTRACTION       Current Outpatient Medications  Medication Sig Dispense Refill   albuterol (VENTOLIN HFA) 108 (90 Base) MCG/ACT inhaler Inhale 2 puffs into the lungs every 6 (six) hours as needed for wheezing or shortness of breath. 8 g 2   amLODipine (NORVASC) 5 MG tablet Take 1 tablet (5 mg total) by mouth daily. 90 tablet 1   chlorhexidine (PERIDEX) 0.12 % solution Use as directed 5 mLs in the mouth or throat daily.     fluticasone (FLONASE) 50 MCG/ACT nasal spray Place 2 sprays into both  nostrils daily. 18.2 mL 2   folic acid (FOLVITE) 1 MG tablet TAKE 2 TABLETS(2 MG) BY MOUTH DAILY 180 tablet 3   hydrocortisone (ANUSOL-HC) 2.5 % rectal cream Place 1 Application rectally 2 (two) times daily. 30 g 1   hydrOXYzine (ATARAX) 25 MG tablet Take 1 tablet (25 mg total) by mouth 3 (three) times daily as needed. 270 tablet 1   methotrexate (RHEUMATREX) 2.5 MG tablet TAKE 8 TABLETS ONCE WEEKLY 96 tablet 0   mupirocin ointment (BACTROBAN) 2 % Apply 1 application topically 2 (two) times daily. 30 g 3   Phentermine-Topiramate (QSYMIA) 11.25-69 MG CP24 Take 1 tablet by mouth daily. 90 capsule 0   Vitamin D, Ergocalciferol, (DRISDOL) 1.25 MG (50000 UNIT) CAPS capsule Take 1 capsule (50,000 Units total) by mouth every 7 (seven) days. 24 capsule 1   No current facility-administered medications for this visit.    Allergies:   Prunus persica, Cat hair extract, Mixed ragweed, and Pollinex-t [modified tree tyrosine adsorbate]    Social History:  The patient  reports that she has never smoked. She has never been exposed to tobacco smoke. She has never used smokeless tobacco. She reports current alcohol use. She reports that she does not use drugs.   Family History:  The patient's family  history includes Alzheimer's disease in her paternal grandfather; Autism in her son; Cancer in her father and paternal aunt; Diabetes in her father, paternal grandmother, and son; High Cholesterol in her brother.    ROS:  Please see the history of present illness.   Otherwise, review of systems are positive for leg swelling.   All other systems are reviewed and negative.    PHYSICAL EXAM: VS:  BP 120/62   Pulse 87   Ht 5\' 8"  (1.727 m)   Wt 203 lb 12.8 oz (92.4 kg)   SpO2 98%   BMI 30.99 kg/m  , BMI Body mass index is 30.99 kg/m. GEN: Well nourished, well developed, in no acute distress HEENT: normal Neck: no JVD, carotid bruits, or masses Cardiac: RRR; no murmurs, rubs, or gallops,; bilateral leg edema   Respiratory:  clear to auscultation bilaterally, normal work of breathing GI: soft, nontender, nondistended, + BS MS: no deformity or atrophy Skin: warm and dry, no rash Neuro:  Strength and sensation are intact Psych: euthymic mood, full affect   EKG:   The ekg ordered today demonstrates NSR, bifascicular block   Recent Labs: 05/11/2023: ALT 16; BUN 9; Creat 0.45; Hemoglobin 12.5; Platelets 297; Potassium 3.9; Sodium 136   Lipid Panel    Component Value Date/Time   CHOL 172 02/25/2023 1656   TRIG 128 02/25/2023 1656   HDL 41 02/25/2023 1656   CHOLHDL 4.2 02/25/2023 1656   LDLCALC 108 (H) 02/25/2023 1656     Other studies Reviewed: Additional studies/ records that were reviewed today with results demonstrating: prior echo reviewed. LDL 108   ASSESSMENT AND PLAN:  Leg edema: Thought to be from lymphedema in the past. Dyspnea on exertion: Normal echo in 2023. Following with rheum and pulmonary.   Current medicines are reviewed at length with the patient today.  The patient concerns regarding her medicines were addressed.  The following changes have been made:  No change  Labs/ tests ordered today include:   Orders Placed This Encounter  Procedures   EKG 12-Lead    Recommend 150 minutes/week of aerobic exercise Low fat, low carb, high fiber diet recommended  Disposition:   FU in 1 year   Signed, Lance Muss, MD  06/10/2023 3:55 PM    [facility] [address] [place], [place], [place]  [address] Phone: [phone]; Fax: [phone]

## 2023-06-10 ENCOUNTER — Encounter: Payer: Self-pay | Admitting: Interventional Cardiology

## 2023-06-10 ENCOUNTER — Ambulatory Visit: Payer: BC Managed Care – PPO | Attending: Interventional Cardiology | Admitting: Interventional Cardiology

## 2023-06-10 VITALS — BP 120/62 | HR 87 | Ht 68.0 in | Wt 203.8 lb

## 2023-06-10 DIAGNOSIS — R600 Localized edema: Secondary | ICD-10-CM

## 2023-06-10 DIAGNOSIS — I890 Lymphedema, not elsewhere classified: Secondary | ICD-10-CM

## 2023-06-10 DIAGNOSIS — R9431 Abnormal electrocardiogram [ECG] [EKG]: Secondary | ICD-10-CM

## 2023-06-10 DIAGNOSIS — R0602 Shortness of breath: Secondary | ICD-10-CM

## 2023-06-10 NOTE — Patient Instructions (Signed)
Medication Instructions:  Your physician recommends that you continue on your current medications as directed. Please refer to the Current Medication list given to you today.  *If you need a refill on your cardiac medications before your next appointment, please call your pharmacy*  Lab Work: None ordered today.  Testing/Procedures: None ordered today.  Follow-Up: At [facility], you and your health needs are our priority.  As part of our continuing mission to provide you with exceptional heart care, we have created designated Provider Care Teams.  These Care Teams include your primary Cardiologist (physician) and Advanced Practice Providers (APPs -  Physician Assistants and Nurse Practitioners) who all work together to provide you with the care you need, when you need it.  Your next appointment:   12 month(s)  The format for your next appointment:   In Person  Provider:   Weston Brass, MD {

## 2023-06-15 ENCOUNTER — Ambulatory Visit (INDEPENDENT_AMBULATORY_CARE_PROVIDER_SITE_OTHER): Payer: BC Managed Care – PPO | Admitting: Pulmonary Disease

## 2023-06-15 ENCOUNTER — Encounter: Payer: Self-pay | Admitting: Primary Care

## 2023-06-15 ENCOUNTER — Ambulatory Visit (INDEPENDENT_AMBULATORY_CARE_PROVIDER_SITE_OTHER): Payer: BC Managed Care – PPO | Admitting: Primary Care

## 2023-06-15 VITALS — BP 120/60 | HR 81 | Temp 98.1°F | Ht 68.0 in | Wt 202.4 lb

## 2023-06-15 DIAGNOSIS — J449 Chronic obstructive pulmonary disease, unspecified: Secondary | ICD-10-CM

## 2023-06-15 DIAGNOSIS — J4520 Mild intermittent asthma, uncomplicated: Secondary | ICD-10-CM

## 2023-06-15 DIAGNOSIS — I890 Lymphedema, not elsewhere classified: Secondary | ICD-10-CM

## 2023-06-15 DIAGNOSIS — M349 Systemic sclerosis, unspecified: Secondary | ICD-10-CM | POA: Diagnosis not present

## 2023-06-15 DIAGNOSIS — M3310 Other dermatopolymyositis, organ involvement unspecified: Secondary | ICD-10-CM

## 2023-06-15 LAB — PULMONARY FUNCTION TEST
DL/VA % pred: 126 %
DL/VA: 5.36 ml/min/mmHg/L
DLCO cor % pred: 115 %
DLCO cor: 28.25 ml/min/mmHg
DLCO unc % pred: 112 %
DLCO unc: 27.44 ml/min/mmHg
FEF 25-75 Post: 1.06 L/sec
FEF 25-75 Pre: 1.7 L/sec
FEF2575-%Change-Post: -37 %
FEF2575-%Pred-Post: 33 %
FEF2575-%Pred-Pre: 53 %
FEV1-%Change-Post: -7 %
FEV1-%Pred-Post: 71 %
FEV1-%Pred-Pre: 77 %
FEV1-Post: 2.35 L
FEV1-Pre: 2.55 L
FEV1FVC-%Change-Post: 10 %
FEV1FVC-%Pred-Pre: 87 %
FEV6-%Change-Post: -15 %
FEV6-%Pred-Post: 74 %
FEV6-%Pred-Pre: 88 %
FEV6-Post: 3 L
FEV6-Pre: 3.57 L
FEV6FVC-%Change-Post: 0 %
FEV6FVC-%Pred-Post: 102 %
FEV6FVC-%Pred-Pre: 102 %
FVC-%Change-Post: -16 %
FVC-%Pred-Post: 72 %
FVC-%Pred-Pre: 86 %
FVC-Post: 3 L
FVC-Pre: 3.58 L
Post FEV1/FVC ratio: 78 %
Post FEV6/FVC ratio: 100 %
Pre FEV1/FVC ratio: 71 %
Pre FEV6/FVC Ratio: 100 %
RV % pred: 94 %
RV: 1.79 L
TLC % pred: 98 %
TLC: 5.56 L

## 2023-06-15 NOTE — Assessment & Plan Note (Addendum)
-   Respiratory wise she is minimally symptomatic, however, there has been a decline in her FEV1 when compared to pulmonary function testing from last year.  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  Due to change in lung function recommend repeating HRCT imaging to assess for temporal changes. FU in 6 months with Dr. Isaiah Serge.

## 2023-06-15 NOTE — Progress Notes (Signed)
Full PFT performed today. °

## 2023-06-15 NOTE — Patient Instructions (Signed)
Full PFT performed today. °

## 2023-06-15 NOTE — Assessment & Plan Note (Signed)
-   Patient has significant lower extremity lymphedema. Uses compression boots once daily for an hour. She may reconsider advanced treatment down the road.

## 2023-06-15 NOTE — Assessment & Plan Note (Signed)
-   Mild asthma with air trapping. Not on maintenance BD regimen. Rare SABA need.

## 2023-06-15 NOTE — Progress Notes (Signed)
@Patient  ID: Patricia Krause, female    DOB: [birth year], 47 y.o.   MRN: [id number]  Chief Complaint  Patient presents with   Follow-up    Referring provider: Arnette Felts, FNP  HPI: 47 year old female. Past medical history significant for hypertension, Raynaud's syndrome, asthma, discoid lupus, dermatomyositis, scleroderma, obesity.   06/15/2023  Patient presents today for annual follow-up. Former patient of Dr. Charleen Kirks. She is followed for mild asthma with air trapping and scleroderma. She is doing well respiratory wise. No trouble breathing. She experience mild dyspnea with exercise or heavy. Denies cough, sputum production, wheezing. Rarely uses Albuterol rescue inhaler. She has chronic sinus congestion/rhinitis. Nasal sprays do help when using. Pulmonary function testing today showed mild-moderate obstruction, increased diffusion capacity. FEV1 has declined from last year. She remains for the most part asymptomatic.   She has significant lower extremity lymphedema. She elected not to follow through with more intense therapy which involved expensive equipment and daily leg wraps. She is using leg compression machine at home for 1 hour daily. She may re-consider when she has more time.   Pulmonary function testing  06/15/23 >> FVC 3.00 (72%), FEV1 2.35 (71%), FEV1 78, DLCO 28.25 (115%)  Chest Imaging: HRCT Chest 12/11/21 with a few small foci of lung scarring, mosaicism on expiratory CT  Allergies  Allergen Reactions   Prunus Persica Swelling    Peaches, plums, nectarines.    Cat Hair Extract Hives and Itching   Mixed Ragweed Other (See Comments)   Pollinex-T [Modified Tree Tyrosine Adsorbate] Other (See Comments)    Immunization History  Administered Date(s) Administered   DT (Pediatric) 05/07/1991   DTP 04-01-1978, 03/26/1978, 09/20/1978, 10/21/1979, 03/30/1985   Hep B, Unspecified 04/20/1996, 05/22/1996, 12/22/1996   Influenza Split 11/27/2010, 09/10/2011   Influenza,inj,Quad  PF,6+ Mos 06/29/2013, 10/15/2014, 08/29/2015, 08/18/2016, 07/23/2021, 09/08/2022   Influenza-Unspecified 08/18/2005, 08/11/2006, 08/29/2007, 07/23/2009   MMR 05/30/1979, 07/07/1989, 06/10/1990   OPV 03/26/1978, 06/08/1978, 09/20/1978, 03/30/1985   PFIZER(Purple Top)SARS-COV-2 Vaccination 12/29/2019, 01/23/2020, 08/02/2020   PPD Test 05/30/1979, 04/19/1998   Pfizer Covid-19 Vaccine Bivalent Booster 77yrs & up 07/05/2021   Pneumococcal Polysaccharide-23 10/15/2014, 08/20/2021   Tdap 09/10/2011, 08/20/2021    Past Medical History:  Diagnosis Date   Asthma    Dermatolysis    Hypertension    Lymphedema    dx by vascular doctor   Pre-diabetes     Tobacco History: Social History   Tobacco Use  Smoking Status Never   Passive exposure: Never  Smokeless Tobacco Never   Counseling given: Not Answered   Outpatient Medications Prior to Visit  Medication Sig Dispense Refill   albuterol (VENTOLIN HFA) 108 (90 Base) MCG/ACT inhaler Inhale 2 puffs into the lungs every 6 (six) hours as needed for wheezing or shortness of breath. 8 g 2   amLODipine (NORVASC) 5 MG tablet Take 1 tablet (5 mg total) by mouth daily. 90 tablet 1   chlorhexidine (PERIDEX) 0.12 % solution Use as directed 5 mLs in the mouth or throat daily.     fluticasone (FLONASE) 50 MCG/ACT nasal spray Place 2 sprays into both nostrils daily. 18.2 mL 2   folic acid (FOLVITE) 1 MG tablet TAKE 2 TABLETS(2 MG) BY MOUTH DAILY 180 tablet 3   hydrocortisone (ANUSOL-HC) 2.5 % rectal cream Place 1 Application rectally 2 (two) times daily. 30 g 1   hydrOXYzine (ATARAX) 25 MG tablet Take 1 tablet (25 mg total) by mouth 3 (three) times daily as needed. 270 tablet 1   methotrexate (RHEUMATREX)  2.5 MG tablet TAKE 8 TABLETS ONCE WEEKLY 96 tablet 0   mupirocin ointment (BACTROBAN) 2 % Apply 1 application topically 2 (two) times daily. 30 g 3   Phentermine-Topiramate (QSYMIA) 11.25-69 MG CP24 Take 1 tablet by mouth daily. 90 capsule 0   Vitamin  D, Ergocalciferol, (DRISDOL) 1.25 MG (50000 UNIT) CAPS capsule Take 1 capsule (50,000 Units total) by mouth every 7 (seven) days. 24 capsule 1   No facility-administered medications prior to visit.    Review of Systems  Review of Systems  Constitutional: Negative.   Respiratory: Negative.  Negative for cough, chest tightness, shortness of breath and wheezing.        DOE  Cardiovascular:  Positive for leg swelling.    Physical Exam  BP 120/60 (BP Location: Left Arm, Patient Position: Sitting, Cuff Size: Normal)   Pulse 81   Temp 98.1 F (36.7 C) (Oral)   Ht 5\' 8"  (1.727 m)   Wt 202 lb 6.4 oz (91.8 kg)   SpO2 99%   BMI 30.77 kg/m  Physical Exam Constitutional:      Appearance: Normal appearance.  HENT:     Head: Normocephalic and atraumatic.  Cardiovascular:     Rate and Rhythm: Normal rate and regular rhythm.     Heart sounds: Murmur heard.  Pulmonary:     Effort: Pulmonary effort is normal.     Breath sounds: Normal breath sounds.     Comments: CTA Musculoskeletal:     Right lower leg: Edema present.     Left lower leg: Edema present.     Comments: Lymphedema  Neurological:     General: No focal deficit present.     Mental Status: She is alert and oriented to person, place, and time. Mental status is at baseline.  Psychiatric:        Mood and Affect: Mood normal.        Behavior: Behavior normal.        Thought Content: Thought content normal.        Judgment: Judgment normal.      Lab Results:  CBC    Component Value Date/Time   WBC 6.0 05/11/2023 1551   RBC 3.79 (L) 05/11/2023 1551   HGB 12.5 05/11/2023 1551   HGB 13.1 09/08/2022 1528   HCT 37.0 05/11/2023 1551   HCT 36.5 09/08/2022 1528   PLT 297 05/11/2023 1551   PLT 291 09/08/2022 1528   MCV 97.6 05/11/2023 1551   MCV 95 09/08/2022 1528   MCH 33.0 05/11/2023 1551   MCHC 33.8 05/11/2023 1551   RDW 12.1 05/11/2023 1551   RDW 11.3 (L) 09/08/2022 1528   LYMPHSABS 1,746 05/11/2023 1551   MONOABS  1.0 12/28/2019 1642   EOSABS 318 05/11/2023 1551   BASOSABS 30 05/11/2023 1551    BMET    Component Value Date/Time   NA 136 05/11/2023 1551   NA 137 09/08/2022 1528   K 3.9 05/11/2023 1551   CL 100 05/11/2023 1551   CO2 28 05/11/2023 1551   GLUCOSE 208 (H) 05/11/2023 1551   BUN 9 05/11/2023 1551   BUN 8 09/08/2022 1528   CREATININE 0.45 (L) 05/11/2023 1551   CALCIUM 9.6 05/11/2023 1551   GFRNONAA >60 12/28/2019 1642   GFRAA >60 12/28/2019 1642    BNP No results found for: "BNP"  ProBNP No results found for: "PROBNP"  Imaging: No results found.   Assessment & Plan:   Scleroderma (HCC) - Respiratory wise she is minimally symptomatic, however,  there has been a decline in her FEV1 when compared to pulmonary function testing from last year.  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  Due to change in lung function recommend repeating HRCT imaging to assess for temporal changes. FU in 6 months with Dr. Isaiah Serge.   Mild intermittent asthma without complication - Mild asthma with air trapping. Not on maintenance BD regimen. Rare SABA need.   Lymphedema - Patient has significant lower extremity lymphedema. Uses compression boots once daily for an hour. She may reconsider advanced treatment down the road.   Adult onset dermatomyositis (HCC) - Follows with rheumatology - Maintained on methotrexate 8mg  once a week    Glenford Bayley, NP 06/15/2023

## 2023-06-15 NOTE — Patient Instructions (Addendum)
-   Slight decline in lung function but clinically appears stable - We will get an updated CT chest to ensure there is not progression of underlying fibrosis/scarring  - Continue Albuterol 2 puffs every 4-6 hours as needed for shortness of breath/wheezing   Orders: - HRCT (ordered)  Follow-up: - 6 months with Dr. Isaiah Serge (new patient- 30 mins)

## 2023-06-15 NOTE — Assessment & Plan Note (Signed)
-   Follows with rheumatology - Maintained on methotrexate 8mg  once a week

## 2023-06-16 DIAGNOSIS — F4320 Adjustment disorder, unspecified: Secondary | ICD-10-CM | POA: Diagnosis not present

## 2023-06-16 DIAGNOSIS — F4310 Post-traumatic stress disorder, unspecified: Secondary | ICD-10-CM | POA: Diagnosis not present

## 2023-06-16 NOTE — Progress Notes (Signed)
Reviewed and agree with assessment/plan.   Coralyn Helling, MD [facility] Pulmonary/Critical Care 06/16/2023, 8:26 AM Pager:  [phone]

## 2023-06-17 ENCOUNTER — Telehealth: Payer: Self-pay | Admitting: Rheumatology

## 2023-06-17 ENCOUNTER — Ambulatory Visit (HOSPITAL_COMMUNITY): Payer: BC Managed Care – PPO

## 2023-06-17 NOTE — Telephone Encounter (Signed)
Patient called stating there is a problem with Dr. Fatima Sanger credentialing with [employer].  Patient states she has been in contact with the [facility] billing department regarding her bill for DOS 02/09/23 with Dr. Corliss Skains.  Patient states she has an appointment scheduled for 08/12/23, but will cancel if Dr. Corliss Skains is not credentialed with [employer] by that date.

## 2023-06-21 DIAGNOSIS — F4310 Post-traumatic stress disorder, unspecified: Secondary | ICD-10-CM | POA: Diagnosis not present

## 2023-06-21 DIAGNOSIS — F4320 Adjustment disorder, unspecified: Secondary | ICD-10-CM | POA: Diagnosis not present

## 2023-06-22 ENCOUNTER — Ambulatory Visit (HOSPITAL_COMMUNITY)
Admission: RE | Admit: 2023-06-22 | Discharge: 2023-06-22 | Disposition: A | Discharge status: Home / Self Care | Payer: BC Managed Care – PPO | Source: Ambulatory Visit | Attending: Primary Care | Admitting: Primary Care

## 2023-06-22 DIAGNOSIS — J849 Interstitial pulmonary disease, unspecified: Secondary | ICD-10-CM | POA: Diagnosis not present

## 2023-06-22 DIAGNOSIS — J449 Chronic obstructive pulmonary disease, unspecified: Secondary | ICD-10-CM | POA: Insufficient documentation

## 2023-06-22 DIAGNOSIS — R918 Other nonspecific abnormal finding of lung field: Secondary | ICD-10-CM | POA: Diagnosis not present

## 2023-06-22 DIAGNOSIS — M3310 Other dermatopolymyositis, organ involvement unspecified: Secondary | ICD-10-CM | POA: Insufficient documentation

## 2023-06-29 DIAGNOSIS — F4320 Adjustment disorder, unspecified: Secondary | ICD-10-CM | POA: Diagnosis not present

## 2023-06-29 DIAGNOSIS — F4310 Post-traumatic stress disorder, unspecified: Secondary | ICD-10-CM | POA: Diagnosis not present

## 2023-06-30 ENCOUNTER — Other Ambulatory Visit: Payer: Self-pay | Admitting: Nurse Practitioner

## 2023-07-06 NOTE — Progress Notes (Signed)
She did have some scattered muscular calcification may be due to Dermatomyositis- please follow-up with either PCP if having rash or muscle weakness

## 2023-07-06 NOTE — Progress Notes (Signed)
Please let patient know CT chest showed some scarring to her lungs, no evidence of interstitial lung disease (pulmonary fibrosis).  Evidence of air trapping which is indicative of small airway disease which she can see with either asthma. She was minimally symptomatic, no change to plan or additional imaging needed

## 2023-07-12 DIAGNOSIS — F4310 Post-traumatic stress disorder, unspecified: Secondary | ICD-10-CM | POA: Diagnosis not present

## 2023-07-12 DIAGNOSIS — F4320 Adjustment disorder, unspecified: Secondary | ICD-10-CM | POA: Diagnosis not present

## 2023-07-22 DIAGNOSIS — F4310 Post-traumatic stress disorder, unspecified: Secondary | ICD-10-CM | POA: Diagnosis not present

## 2023-07-22 DIAGNOSIS — F4320 Adjustment disorder, unspecified: Secondary | ICD-10-CM | POA: Diagnosis not present

## 2023-07-26 DIAGNOSIS — F4320 Adjustment disorder, unspecified: Secondary | ICD-10-CM | POA: Diagnosis not present

## 2023-07-26 DIAGNOSIS — F4310 Post-traumatic stress disorder, unspecified: Secondary | ICD-10-CM | POA: Diagnosis not present

## 2023-07-29 NOTE — Progress Notes (Deleted)
Office Visit Note  Patient: Patricia Krause             Date of Birth: 20-Jun-1976           MRN: [id number]             PCP: Arnette Felts, FNP Referring: Arnette Felts, FNP Visit Date: 08/12/2023 Occupation: @GUAROCC @  Subjective:  No chief complaint on file.   History of Present Illness: Patricia Krause is a 47 y.o. female ***     Activities of Daily Living:  Patient reports morning stiffness for *** {minute/hour:19697}.   Patient {ACTIONS;DENIES/REPORTS:21021675::"Denies"} nocturnal pain.  Difficulty dressing/grooming: {ACTIONS;DENIES/REPORTS:21021675::"Denies"} Difficulty climbing stairs: {ACTIONS;DENIES/REPORTS:21021675::"Denies"} Difficulty getting out of chair: {ACTIONS;DENIES/REPORTS:21021675::"Denies"} Difficulty using hands for taps, buttons, cutlery, and/or writing: {ACTIONS;DENIES/REPORTS:21021675::"Denies"}  No Rheumatology ROS completed.   PMFS History:  Patient Active Problem List   Diagnosis Date Noted   Scleroderma (HCC) 06/15/2023   Mild intermittent asthma without complication 06/15/2023   Low HDL (under 40) 02/25/2023   Lymphedema 02/25/2023   Class 1 obesity due to excess calories without serious comorbidity with body mass index (BMI) of 31.0 to 31.9 in adult 02/25/2023   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   Raynaud's syndrome without gangrene 12/02/2021   History of asthma 11/06/2021   Vitamin D deficiency 11/06/2021   Prediabetes 11/06/2021   Essential hypertension 11/06/2021   Hypokalemia 08/20/2021   Scarring alopecia 08/18/2016   Discoid lupus 08/13/2015    Past Medical History:  Diagnosis Date   Asthma    Dermatolysis    Hypertension    Lymphedema    dx by vascular doctor   Pre-diabetes     Family History  Problem Relation Age of Onset   Diabetes Father    Cancer Father        prostate cancer in her 36s.   Cancer Paternal Aunt        uncertain what type of cancer   Diabetes Paternal Grandmother    Alzheimer's  disease Paternal Grandfather    High Cholesterol Brother    Diabetes Son    Autism Son    Colon cancer Neg Hx    Breast cancer Neg Hx    Past Surgical History:  Procedure Laterality Date   BREAST EXCISIONAL BIOPSY Left    BREAST SURGERY     COLONOSCOPY  03/2022   WISDOM TOOTH EXTRACTION     Social History   Social History Narrative   Not on file   Immunization History  Administered Date(s) Administered   DT (Pediatric) 05/07/1991   DTP 08/13/78, 03/26/1978, 09/20/1978, 10/21/1979, 03/30/1985   Hep B, Unspecified 04/20/1996, 05/22/1996, 12/22/1996   Influenza Split 11/27/2010, 09/10/2011   Influenza,inj,Quad PF,6+ Mos 06/29/2013, 10/15/2014, 08/29/2015, 08/18/2016, 07/23/2021, 09/08/2022   Influenza-Unspecified 08/18/2005, 08/11/2006, 08/29/2007, 07/23/2009   MMR 05/30/1979, 07/07/1989, 06/10/1990   OPV 03/26/1978, 06/08/1978, 09/20/1978, 03/30/1985   PFIZER(Purple Top)SARS-COV-2 Vaccination 12/29/2019, 01/23/2020, 08/02/2020   PPD Test 05/30/1979, 04/19/1998   Pfizer Covid-19 Vaccine Bivalent Booster 85yrs & up 07/05/2021   Pneumococcal Polysaccharide-23 10/15/2014, 08/20/2021   Tdap 09/10/2011, 08/20/2021     Objective: Vital Signs: There were no vitals taken for this visit.   Physical Exam   Musculoskeletal Exam: ***  CDAI Exam: CDAI Score: -- Patient Global: --; Provider Global: -- Swollen: --; Tender: -- Joint Exam 08/12/2023   No joint exam has been documented for this visit   There is currently no information documented on the homunculus. Go to the Rheumatology activity and complete the homunculus joint  exam.  Investigation: No additional findings.  Imaging: No results found.  Recent Labs: Lab Results  Component Value Date   WBC 6.0 05/11/2023   HGB 12.5 05/11/2023   PLT 297 05/11/2023   NA 136 05/11/2023   K 3.9 05/11/2023   CL 100 05/11/2023   CO2 28 05/11/2023   GLUCOSE 208 (H) 05/11/2023   BUN 9 05/11/2023   CREATININE 0.45 (L)  05/11/2023   BILITOT 0.5 05/11/2023   ALKPHOS 71 09/08/2022   AST 24 05/11/2023   ALT 16 05/11/2023   PROT 8.0 05/11/2023   ALBUMIN 4.4 09/08/2022   CALCIUM 9.6 05/11/2023   GFRAA >60 12/28/2019   QFTBGOLDPLUS NEGATIVE 11/06/2021    Speciality Comments: Dx in 2005-presented postpartum '04. Left bicep muscle bx 2005 + skin bx+. IVIG in the past.Ttd with MTX, PLQ and MMF. Off all meds since 2018  Procedures:  No procedures performed Allergies: Prunus persica, Cat hair extract, Mixed ragweed, and Pollinex-t [modified tree tyrosine adsorbate]   Assessment / Plan:     Visit Diagnoses: No diagnosis found.  Orders: No orders of the defined types were placed in this encounter.  No orders of the defined types were placed in this encounter.   Face-to-face time spent with patient was *** minutes. Greater than 50% of time was spent in counseling and coordination of care.  Follow-Up Instructions: No follow-ups on file.   Ellen Henri, CMA  Note - This record has been created using Animal nutritionist.  Chart creation errors have been sought, but may not always  have been located. Such creation errors do not reflect on  the standard of medical care.

## 2023-08-04 DIAGNOSIS — F4320 Adjustment disorder, unspecified: Secondary | ICD-10-CM | POA: Diagnosis not present

## 2023-08-04 DIAGNOSIS — F4310 Post-traumatic stress disorder, unspecified: Secondary | ICD-10-CM | POA: Diagnosis not present

## 2023-08-09 DIAGNOSIS — F4320 Adjustment disorder, unspecified: Secondary | ICD-10-CM | POA: Diagnosis not present

## 2023-08-09 DIAGNOSIS — F4310 Post-traumatic stress disorder, unspecified: Secondary | ICD-10-CM | POA: Diagnosis not present

## 2023-08-12 ENCOUNTER — Ambulatory Visit: Payer: BC Managed Care – PPO | Admitting: Physician Assistant

## 2023-08-12 DIAGNOSIS — R0602 Shortness of breath: Secondary | ICD-10-CM

## 2023-08-12 DIAGNOSIS — R7303 Prediabetes: Secondary | ICD-10-CM

## 2023-08-12 DIAGNOSIS — Z8709 Personal history of other diseases of the respiratory system: Secondary | ICD-10-CM

## 2023-08-12 DIAGNOSIS — R600 Localized edema: Secondary | ICD-10-CM

## 2023-08-12 DIAGNOSIS — M3310 Other dermatopolymyositis, organ involvement unspecified: Secondary | ICD-10-CM

## 2023-08-12 DIAGNOSIS — I10 Essential (primary) hypertension: Secondary | ICD-10-CM

## 2023-08-12 DIAGNOSIS — M79641 Pain in right hand: Secondary | ICD-10-CM

## 2023-08-12 DIAGNOSIS — Z79899 Other long term (current) drug therapy: Secondary | ICD-10-CM

## 2023-08-12 DIAGNOSIS — I7300 Raynaud's syndrome without gangrene: Secondary | ICD-10-CM

## 2023-08-12 DIAGNOSIS — R5383 Other fatigue: Secondary | ICD-10-CM

## 2023-08-12 DIAGNOSIS — E559 Vitamin D deficiency, unspecified: Secondary | ICD-10-CM

## 2023-08-12 DIAGNOSIS — M349 Systemic sclerosis, unspecified: Secondary | ICD-10-CM

## 2023-08-24 DIAGNOSIS — F4310 Post-traumatic stress disorder, unspecified: Secondary | ICD-10-CM | POA: Diagnosis not present

## 2023-08-24 DIAGNOSIS — F4320 Adjustment disorder, unspecified: Secondary | ICD-10-CM | POA: Diagnosis not present

## 2023-08-31 DIAGNOSIS — F4320 Adjustment disorder, unspecified: Secondary | ICD-10-CM | POA: Diagnosis not present

## 2023-08-31 DIAGNOSIS — F4310 Post-traumatic stress disorder, unspecified: Secondary | ICD-10-CM | POA: Diagnosis not present

## 2023-09-06 DIAGNOSIS — F4310 Post-traumatic stress disorder, unspecified: Secondary | ICD-10-CM | POA: Diagnosis not present

## 2023-09-06 DIAGNOSIS — F4320 Adjustment disorder, unspecified: Secondary | ICD-10-CM | POA: Diagnosis not present

## 2023-09-07 NOTE — Progress Notes (Unsigned)
47 y.o. G1P1 Married [place] American female here for annual exam.    PCP: Arnette Felts, FNP   No LMP recorded. (Menstrual status: IUD).           Sexually active: Yes.    The current method of family planning is IUD--Mirena 2020.    Menopausal hormone therapy:  n/a Exercising: {yes no:314532}  {types:19826} Smoker:  no  OB History  Gravida Para Term Preterm AB Living  1 1       1   SAB IAB Ectopic Multiple Live Births               # Outcome Date GA Lbr Len/2nd Weight Sex Type Anes PTL Lv  1 Para              HEALTH MAINTENANCE: Last 2 paps:  08/20/22 neg: HR HPV neg History of abnormal Pap or positive HPV:  {YES NO:22349} Mammogram:   02/18/23 Breast Density Cat C, BI-RADS CAT 1 neg Colonoscopy:  04/07/22 Bone Density:  n/a  Result  n/a   Immunization History  Administered Date(s) Administered   DT (Pediatric) 05/07/1991   DTP 10/23/77, 03/26/1978, 09/20/1978, 10/21/1979, 03/30/1985   Hep B, Unspecified 04/20/1996, 05/22/1996, 12/22/1996   Influenza Split 11/27/2010, 09/10/2011   Influenza,inj,Quad PF,6+ Mos 06/29/2013, 10/15/2014, 08/29/2015, 08/18/2016, 07/23/2021, 09/08/2022   Influenza-Unspecified 08/18/2005, 08/11/2006, 08/29/2007, 07/23/2009   MMR 05/30/1979, 07/07/1989, 06/10/1990   OPV 03/26/1978, 06/08/1978, 09/20/1978, 03/30/1985   PFIZER(Purple Top)SARS-COV-2 Vaccination 12/29/2019, 01/23/2020, 08/02/2020   PPD Test 05/30/1979, 04/19/1998   Pfizer Covid-19 Vaccine Bivalent Booster 66yrs & up 07/05/2021   Pneumococcal Polysaccharide-23 10/15/2014, 08/20/2021   Tdap 09/10/2011, 08/20/2021      reports that she has never smoked. She has never been exposed to tobacco smoke. She has never used smokeless tobacco. She reports current alcohol use. She reports that she does not use drugs.  Past Medical History:  Diagnosis Date   Asthma    Dermatolysis    Hypertension    Lymphedema    dx by vascular doctor   Pre-diabetes     Past Surgical History:   Procedure Laterality Date   BREAST EXCISIONAL BIOPSY Left    BREAST SURGERY     COLONOSCOPY  03/2022   WISDOM TOOTH EXTRACTION      Current Outpatient Medications  Medication Sig Dispense Refill   albuterol (VENTOLIN HFA) 108 (90 Base) MCG/ACT inhaler Inhale 2 puffs into the lungs every 6 (six) hours as needed for wheezing or shortness of breath. 8 g 2   amLODipine (NORVASC) 5 MG tablet Take 1 tablet (5 mg total) by mouth daily. 90 tablet 1   chlorhexidine (PERIDEX) 0.12 % solution Use as directed 5 mLs in the mouth or throat daily.     fluticasone (FLONASE) 50 MCG/ACT nasal spray Place 2 sprays into both nostrils daily. 18.2 mL 2   folic acid (FOLVITE) 1 MG tablet TAKE 2 TABLETS(2 MG) BY MOUTH DAILY 180 tablet 3   hydrocortisone (ANUSOL-HC) 2.5 % rectal cream Place 1 Application rectally 2 (two) times daily. 30 g 1   hydrOXYzine (ATARAX) 25 MG tablet Take 1 tablet (25 mg total) by mouth 3 (three) times daily as needed. 270 tablet 1   methotrexate (RHEUMATREX) 2.5 MG tablet TAKE 8 TABLETS BY MOUTH ONCE WEEKLY 96 tablet 0   mupirocin ointment (BACTROBAN) 2 % Apply 1 application topically 2 (two) times daily. 30 g 3   Phentermine-Topiramate (QSYMIA) 11.25-69 MG CP24 Take 1 tablet by mouth daily. 90 capsule  0   Vitamin D, Ergocalciferol, (DRISDOL) 1.25 MG (50000 UNIT) CAPS capsule Take 1 capsule (50,000 Units total) by mouth every 7 (seven) days. 24 capsule 1   No current facility-administered medications for this visit.    ALLERGIES: Prunus persica, Cat hair extract, Mixed ragweed, and Pollinex-t [modified tree tyrosine adsorbate]  Family History  Problem Relation Age of Onset   Diabetes Father    Cancer Father        prostate cancer in her 35s.   Cancer Paternal Aunt        uncertain what type of cancer   Diabetes Paternal Grandmother    Alzheimer's disease Paternal Grandfather    High Cholesterol Brother    Diabetes Son    Autism Son    Colon cancer Neg Hx    Breast cancer  Neg Hx     Review of Systems  PHYSICAL EXAM:  There were no vitals taken for this visit.    General appearance: alert, cooperative and appears stated age Head: normocephalic, without obvious abnormality, atraumatic Neck: no adenopathy, supple, symmetrical, trachea midline and thyroid normal to inspection and palpation Lungs: clear to auscultation bilaterally Breasts: normal appearance, no masses or tenderness, No nipple retraction or dimpling, No nipple discharge or bleeding, No axillary adenopathy Heart: regular rate and rhythm Abdomen: soft, non-tender; no masses, no organomegaly Extremities: extremities normal, atraumatic, no cyanosis or edema Skin: skin color, texture, turgor normal. No rashes or lesions Lymph nodes: cervical, supraclavicular, and axillary nodes normal. Neurologic: grossly normal  Pelvic: External genitalia:  no lesions              No abnormal inguinal nodes palpated.              Urethra:  normal appearing urethra with no masses, tenderness or lesions              Bartholins and Skenes: normal                 Vagina: normal appearing vagina with normal color and discharge, no lesions              Cervix: no lesions              Pap taken: {yes no:314532} Bimanual Exam:  Uterus:  normal size, contour, position, consistency, mobility, non-tender              Adnexa: no mass, fullness, tenderness              Rectal exam: {yes no:314532}.  Confirms.              Anus:  normal sphincter tone, no lesions  Chaperone was present for exam:  {BSCHAPERONE:31226::"Lynton Crescenzo F, CMA"}  ASSESSMENT: Well woman visit with gynecologic exam  ***  PLAN: Mammogram screening discussed. Self breast awareness reviewed. Pap and HRV collected:  {yes no:314532} Guidelines for Calcium, Vitamin D, regular exercise program including cardiovascular and weight bearing exercise. Medication refills:  *** {LABS (Optional):23779} Follow up:  ***    Additional counseling given.  {yes  [id number]. ***  total time was spent for this patient encounter, including preparation, face-to-face counseling with the patient, coordination of care, and documentation of the encounter in addition to doing the well woman visit with gynecologic exam.

## 2023-09-14 ENCOUNTER — Ambulatory Visit (INDEPENDENT_AMBULATORY_CARE_PROVIDER_SITE_OTHER): Payer: BC Managed Care – PPO | Admitting: Nurse Practitioner

## 2023-09-14 ENCOUNTER — Encounter: Payer: Self-pay | Admitting: Nurse Practitioner

## 2023-09-14 VITALS — BP 124/70 | HR 79 | Temp 98.8°F | Ht 68.0 in | Wt 205.8 lb

## 2023-09-14 DIAGNOSIS — R7303 Prediabetes: Secondary | ICD-10-CM

## 2023-09-14 DIAGNOSIS — E66811 Obesity, class 1: Secondary | ICD-10-CM

## 2023-09-14 DIAGNOSIS — E786 Lipoprotein deficiency: Secondary | ICD-10-CM | POA: Diagnosis not present

## 2023-09-14 DIAGNOSIS — M3310 Other dermatopolymyositis, organ involvement unspecified: Secondary | ICD-10-CM

## 2023-09-14 DIAGNOSIS — Z0000 Encounter for general adult medical examination without abnormal findings: Secondary | ICD-10-CM | POA: Insufficient documentation

## 2023-09-14 DIAGNOSIS — Z6831 Body mass index (BMI) 31.0-31.9, adult: Secondary | ICD-10-CM

## 2023-09-14 DIAGNOSIS — I10 Essential (primary) hypertension: Secondary | ICD-10-CM | POA: Diagnosis not present

## 2023-09-14 DIAGNOSIS — E6609 Other obesity due to excess calories: Secondary | ICD-10-CM

## 2023-09-14 DIAGNOSIS — L930 Discoid lupus erythematosus: Secondary | ICD-10-CM

## 2023-09-14 DIAGNOSIS — Z79899 Other long term (current) drug therapy: Secondary | ICD-10-CM | POA: Diagnosis not present

## 2023-09-14 DIAGNOSIS — E559 Vitamin D deficiency, unspecified: Secondary | ICD-10-CM

## 2023-09-14 NOTE — Assessment & Plan Note (Signed)
Will check lipid panel. Continue low fat diet

## 2023-09-14 NOTE — Patient Instructions (Signed)
Health Maintenance  Topic Date Due   COVID-19 Vaccine (7 - 2023-24 season) 08/21/2023   Flu Shot  01/10/2024*   Pap with HPV screening  08/21/2027   DTaP/Tdap/Td vaccine (8 - Td or Tdap) 08/21/2031   Colon Cancer Screening  04/07/2032   Hepatitis C Screening  Completed   HIV Screening  Completed   HPV Vaccine  Aged Out  *Topic was postponed. The date shown is not the original due date.

## 2023-09-14 NOTE — Assessment & Plan Note (Signed)
Continue follow-up with Rheumatology.

## 2023-09-14 NOTE — Assessment & Plan Note (Signed)
Continue f/u with Dermatology.

## 2023-09-14 NOTE — Assessment & Plan Note (Signed)
Behavior modifications discussed and diet history reviewed.   Pt will continue to exercise regularly and modify diet with low GI, plant based foods and decrease intake of processed foods.  Recommend intake of daily multivitamin, Vitamin D, and calcium.  Recommend mammogram and colonoscopy for preventive screenings, as well as recommend immunizations that include influenza, TDAP. Will call [facility] for her covid vaccine Health Maintenance reviewed - urine microalbumin ordered.

## 2023-09-14 NOTE — Assessment & Plan Note (Signed)
Will check vitamin D level and supplement as needed.    Also encouraged to spend 15 minutes in the sun daily.   

## 2023-09-14 NOTE — Assessment & Plan Note (Signed)
Continue focusing on healthy diet and regular exercise as tolerated

## 2023-09-14 NOTE — Assessment & Plan Note (Signed)
 She is encouraged to strive for BMI less than 30 to decrease cardiac risk. Advised to aim for at least 150 minutes of exercise per week.

## 2023-09-14 NOTE — Assessment & Plan Note (Signed)
Blood pressure is controlled, continue current medications

## 2023-09-14 NOTE — Progress Notes (Signed)
I,Jameka J Llittleton, CMA,acting as a Neurosurgeon for [employer], FNP.,have documented all relevant documentation on the behalf of Arnette Felts, FNP,as directed by  Arnette Felts, FNP while in the presence of Arnette Felts, FNP.  Subjective:    Patient ID: Patricia Krause , female    DOB: 09-19-1976 , 47 y.o.   MRN: [id number]  Chief Complaint  Patient presents with   Annual Exam    HPI  Patient presents today for a physical. Patient reports compliance with her meds. Patient denies having any headaches, chest pain or sob. Patient doesn't have any questions or concerns at this time.  She has been to the cardiologist and pulmonologist. She has a Rheumatology appt next week and her GYN appt next week.        Past Medical History:  Diagnosis Date   Asthma    Dermatolysis    Hypertension    Lymphedema    dx by vascular doctor   Pre-diabetes      Family History  Problem Relation Age of Onset   Diabetes Father    Cancer Father        prostate cancer in her 70s.   Cancer Paternal Aunt        uncertain what type of cancer   Diabetes Paternal Grandmother    Alzheimer's disease Paternal Grandfather    High Cholesterol Brother    Diabetes Son    Autism Son    Colon cancer Neg Hx    Breast cancer Neg Hx      Current Outpatient Medications:    albuterol (VENTOLIN HFA) 108 (90 Base) MCG/ACT inhaler, Inhale 2 puffs into the lungs every 6 (six) hours as needed for wheezing or shortness of breath., Disp: 8 g, Rfl: 2   amLODipine (NORVASC) 5 MG tablet, Take 1 tablet (5 mg total) by mouth daily., Disp: 90 tablet, Rfl: 1   chlorhexidine (PERIDEX) 0.12 % solution, Use as directed 5 mLs in the mouth or throat daily., Disp: , Rfl:    fluticasone (FLONASE) 50 MCG/ACT nasal spray, Place 2 sprays into both nostrils daily., Disp: 18.2 mL, Rfl: 2   folic acid (FOLVITE) 1 MG tablet, TAKE 2 TABLETS(2 MG) BY MOUTH DAILY, Disp: 180 tablet, Rfl: 3   hydrocortisone (ANUSOL-HC) 2.5 % rectal cream, Place 1  Application rectally 2 (two) times daily., Disp: 30 g, Rfl: 1   hydrOXYzine (ATARAX) 25 MG tablet, Take 1 tablet (25 mg total) by mouth 3 (three) times daily as needed., Disp: 270 tablet, Rfl: 1   methotrexate (RHEUMATREX) 2.5 MG tablet, TAKE 8 TABLETS BY MOUTH ONCE WEEKLY, Disp: 96 tablet, Rfl: 0   mupirocin ointment (BACTROBAN) 2 %, Apply 1 application topically 2 (two) times daily., Disp: 30 g, Rfl: 3   Vitamin D, Ergocalciferol, (DRISDOL) 1.25 MG (50000 UNIT) CAPS capsule, Take 1 capsule (50,000 Units total) by mouth every 7 (seven) days., Disp: 24 capsule, Rfl: 1   Allergies  Allergen Reactions   Prunus Persica Swelling    Peaches, plums, nectarines.    Cat Hair Extract Hives and Itching   Mixed Ragweed Other (See Comments)   Pollinex-T [Modified Tree Tyrosine Adsorbate] Other (See Comments)      The patient states she uses IUD for birth control. No LMP recorded. (Menstrual status: IUD).. Negative for Dysmenorrhea and Negative for Menorrhagia. Negative for: breast discharge, breast lump(s), breast pain and breast self exam. Associated symptoms include abnormal vaginal bleeding. Pertinent negatives include abnormal bleeding (hematology), anxiety, decreased libido, depression, difficulty  falling sleep, dyspareunia, history of infertility, nocturia, sexual dysfunction, sleep disturbances, urinary incontinence, urinary urgency, vaginal discharge and vaginal itching. Diet regular; states "continuous work in progress especially with the holidays".  She is still not able to afford going to the lymphedema clinic. [place] [employer]  The patient states her exercise level is minimal with youtube videos with walking or dancing videos - 5 days a week.  The patient's tobacco use is:  Social History   Tobacco Use  Smoking Status Never   Passive exposure: Never  Smokeless Tobacco Never  . She has been exposed to passive smoke. The patient's alcohol use  is:  Social History   Substance and Sexual Activity  Alcohol Use Yes   Comment: 1-2 drinks/months  Additional information: Last pap 08/20/2022, next one scheduled for 08/20/2025.    Review of Systems  Constitutional: Negative.   HENT: Negative.    Eyes: Negative.   Respiratory: Negative.    Cardiovascular:  Positive for leg swelling (lymphedema).  Gastrointestinal: Negative.   Endocrine: Negative.   Genitourinary: Negative.   Musculoskeletal: Negative.   Skin: Negative.   Allergic/Immunologic: Negative.   Neurological: Negative.   Hematological: Negative.   Psychiatric/Behavioral: Negative.       Today's Vitals   09/14/23 1450  BP: 124/70  Pulse: 79  Temp: 98.8 F (37.1 C)  Weight: 205 lb 12.8 oz (93.4 kg)  Height: 5\' 8"  (1.727 m)  PainSc: 0-No pain   Body mass index is 31.29 kg/m.  Wt Readings from Last 3 Encounters:  09/14/23 205 lb 12.8 oz (93.4 kg)  06/15/23 202 lb 6.4 oz (91.8 kg)  06/10/23 203 lb 12.8 oz (92.4 kg)     Objective:  Physical Exam Vitals reviewed.  Constitutional:      General: She is not in acute distress.    Appearance: Normal appearance. She is well-developed. She is obese.  HENT:     Head: Normocephalic and atraumatic.     Right Ear: Hearing, tympanic membrane, ear canal and external ear normal. There is no impacted cerumen.     Left Ear: Hearing, tympanic membrane, ear canal and external ear normal. There is no impacted cerumen.     Nose: Nose normal.     Mouth/Throat:     Mouth: Mucous membranes are moist.  Eyes:     General: Lids are normal.     Extraocular Movements: Extraocular movements intact.     Conjunctiva/sclera: Conjunctivae normal.     Pupils: Pupils are equal, round, and reactive to light.     Funduscopic exam:    Right eye: No papilledema.        Left eye: No papilledema.  Neck:     Thyroid: No thyroid mass.     Vascular: No carotid bruit.  Cardiovascular:     Rate and Rhythm: Normal rate and regular rhythm.      Pulses: Normal pulses.     Heart sounds: Normal heart sounds. No murmur heard. Pulmonary:     Effort: Pulmonary effort is normal. No respiratory distress.     Breath sounds: Normal breath sounds. No wheezing.  Chest:     Chest wall: No mass.  Breasts:    Tanner Score is 5.     Right: Normal. No mass or tenderness.     Left: Normal. No mass or tenderness.  Abdominal:     General: Abdomen is flat. Bowel sounds are normal. There is no distension.  Palpations: Abdomen is soft.     Tenderness: There is no abdominal tenderness.  Genitourinary:    Rectum: Guaiac result negative.  Musculoskeletal:        General: No swelling. Normal range of motion.     Cervical back: Full passive range of motion without pain, normal range of motion and neck supple.     Right lower leg: Edema (lymphedema) present.     Left lower leg: Edema (lymphedema) present.  Lymphadenopathy:     Upper Body:     Right upper body: No supraclavicular, axillary or pectoral adenopathy.     Left upper body: No supraclavicular, axillary or pectoral adenopathy.  Skin:    General: Skin is warm and dry.     Capillary Refill: Capillary refill takes less than 2 seconds.  Neurological:     General: No focal deficit present.     Mental Status: She is alert and oriented to person, place, and time.     Cranial Nerves: No cranial nerve deficit.     Sensory: No sensory deficit.     Motor: No weakness.  Psychiatric:        Mood and Affect: Mood normal.        Behavior: Behavior normal.        Thought Content: Thought content normal.        Judgment: Judgment normal.      Assessment And Plan:     Encounter for general health examination Assessment & Plan: Behavior modifications discussed and diet history reviewed.   Pt will continue to exercise regularly and modify diet with low GI, plant based foods and decrease intake of processed foods.  Recommend intake of daily multivitamin, Vitamin D, and calcium.  Recommend  mammogram and colonoscopy for preventive screenings, as well as recommend immunizations that include influenza, TDAP. Will call [facility] for her covid vaccine Health Maintenance reviewed - urine microalbumin ordered.    Class 1 obesity due to excess calories without serious comorbidity with body mass index (BMI) of 31.0 to 31.9 in adult Assessment & Plan: She is encouraged to strive for BMI less than 30 to decrease cardiac risk. Advised to aim for at least 150 minutes of exercise per week.    Prediabetes Assessment & Plan: Continue focusing on healthy diet and regular exercise as tolerated  Orders: -     Hemoglobin A1c  Essential hypertension Assessment & Plan: Blood pressure is controlled, continue current medications  Orders: -     POCT urinalysis dipstick -     Microalbumin / creatinine urine ratio -     CMP14+EGFR  Low HDL (under 40) Assessment & Plan: Will check lipid panel. Continue low fat diet  Orders: -     Lipid panel  Vitamin D deficiency Assessment & Plan: Will check vitamin D level and supplement as needed.    Also encouraged to spend 15 minutes in the sun daily.     Discoid lupus Assessment & Plan: Continue follow up with Rheumatology   Other long term (current) drug therapy -     CBC  Adult onset dermatomyositis (HCC) Assessment & Plan: Continue f/u with Dermatology.     Return for 1 year physical; 6 month prediabetes. Patient was given opportunity to ask questions. Patient verbalized understanding of the plan and was able to repeat key elements of the plan. All questions were answered to their satisfaction.   Arnette Felts, FNP  I, Arnette Felts, FNP, have reviewed all documentation for this visit. The  documentation on 09/22/23 for the exam, diagnosis, procedures, and orders are all accurate and complete.

## 2023-09-15 DIAGNOSIS — F4320 Adjustment disorder, unspecified: Secondary | ICD-10-CM | POA: Diagnosis not present

## 2023-09-15 DIAGNOSIS — F4310 Post-traumatic stress disorder, unspecified: Secondary | ICD-10-CM | POA: Diagnosis not present

## 2023-09-15 LAB — CBC
Hematocrit: 36.6 % (ref 34.0–46.6)
Hemoglobin: 12.6 g/dL (ref 11.1–15.9)
MCH: 33.3 pg — ABNORMAL HIGH (ref 26.6–33.0)
MCHC: 34.4 g/dL (ref 31.5–35.7)
MCV: 97 fL (ref 79–97)
Platelets: 282 10*3/uL (ref 150–450)
RBC: 3.78 x10E6/uL (ref 3.77–5.28)
RDW: 12 % (ref 11.7–15.4)
WBC: 6.2 10*3/uL (ref 3.4–10.8)

## 2023-09-15 LAB — CMP14+EGFR
ALT: 22 [IU]/L (ref 0–32)
AST: 31 [IU]/L (ref 0–40)
Albumin: 4.2 g/dL (ref 3.9–4.9)
Alkaline Phosphatase: 62 [IU]/L (ref 44–121)
BUN/Creatinine Ratio: 17 (ref 9–23)
BUN: 8 mg/dL (ref 6–24)
Bilirubin Total: 0.5 mg/dL (ref 0.0–1.2)
CO2: 22 mmol/L (ref 20–29)
Calcium: 9.1 mg/dL (ref 8.7–10.2)
Chloride: 102 mmol/L (ref 96–106)
Creatinine, Ser: 0.47 mg/dL — ABNORMAL LOW (ref 0.57–1.00)
Globulin, Total: 3.3 g/dL (ref 1.5–4.5)
Glucose: 189 mg/dL — ABNORMAL HIGH (ref 70–99)
Potassium: 4 mmol/L (ref 3.5–5.2)
Sodium: 138 mmol/L (ref 134–144)
Total Protein: 7.5 g/dL (ref 6.0–8.5)
eGFR: 120 mL/min/{1.73_m2} (ref >59)

## 2023-09-15 LAB — POCT URINALYSIS DIPSTICK
Blood, UA: NEGATIVE
Glucose, UA: NEGATIVE
Ketones, UA: NEGATIVE
Leukocytes, UA: NEGATIVE
Nitrite, UA: NEGATIVE
Protein, UA: POSITIVE — AB
Spec Grav, UA: >=1.03 — AB (ref 1.010–1.025)
Urobilinogen, UA: 0.2 U/dL
pH, UA: 5.5 (ref 5.0–8.0)

## 2023-09-15 LAB — LIPID PANEL
Chol/HDL Ratio: 4.1 {ratio} (ref 0.0–4.4)
Cholesterol, Total: 167 mg/dL (ref 100–199)
HDL: 41 mg/dL (ref >39)
LDL Chol Calc (NIH): 98 mg/dL (ref 0–99)
Triglycerides: 157 mg/dL — ABNORMAL HIGH (ref 0–149)
VLDL Cholesterol Cal: 28 mg/dL (ref 5–40)

## 2023-09-15 LAB — MICROALBUMIN / CREATININE URINE RATIO
Creatinine, Urine: 226.1 mg/dL
Microalb/Creat Ratio: 49 mg/g{creat} — ABNORMAL HIGH (ref 0–29)
Microalbumin, Urine: 109.7 ug/mL

## 2023-09-15 LAB — HEMOGLOBIN A1C
Est. average glucose Bld gHb Est-mCnc: 134 mg/dL
Hgb A1c MFr Bld: 6.3 % — ABNORMAL HIGH (ref 4.8–5.6)

## 2023-09-20 ENCOUNTER — Other Ambulatory Visit: Payer: Self-pay | Admitting: Nurse Practitioner

## 2023-09-21 ENCOUNTER — Ambulatory Visit: Payer: BC Managed Care – PPO | Admitting: Obstetrics and Gynecology

## 2023-09-22 ENCOUNTER — Encounter: Payer: Self-pay | Admitting: Nurse Practitioner

## 2023-09-22 ENCOUNTER — Other Ambulatory Visit: Payer: Self-pay

## 2023-09-22 ENCOUNTER — Other Ambulatory Visit: Payer: Self-pay | Admitting: Nurse Practitioner

## 2023-09-22 DIAGNOSIS — F4310 Post-traumatic stress disorder, unspecified: Secondary | ICD-10-CM | POA: Diagnosis not present

## 2023-09-22 DIAGNOSIS — F4320 Adjustment disorder, unspecified: Secondary | ICD-10-CM | POA: Diagnosis not present

## 2023-09-22 MED ORDER — AMLODIPINE BESYLATE 5 MG PO TABS: 5.0000 mg | ORAL_TABLET | Freq: Every day | ORAL | 1 refills | Status: DC

## 2023-09-22 NOTE — Progress Notes (Unsigned)
Office Visit Note  Patient: Patricia Krause             Date of Birth: 15-Aug-1976           MRN: [id number]             PCP: Arnette Felts, FNP Referring: Arnette Felts, FNP Visit Date: 09/23/2023 Occupation: @GUAROCC @  Subjective:  Medication monitoring   History of Present Illness: Patricia Krause is a 47 y.o. female with history of dermatomyositis.  She remains on Methotrexate 8 tablets by mouth once weekly and folic acid 2 mg daily.  She is tolerating methotrexate without any side effects and has not had any recent gaps in therapy.  She denies any increased muscular weakness.  She has not had any increased joint pain or joint swelling.  She denies any new or worsening pulmonary symptoms.  She continues to have symptoms of Raynaud's phenomenon.  She has been using space heaters in the home and has likely been able to continue to work from home which has been helpful.  Patient was evaluated by cardiology at the end of August and by pulmonology at the beginning of September 2024.  She missed her gynecology appointment this month which has been rescheduled to April 2024.    Activities of Daily Living:  Patient reports morning stiffness for 5-10 minutes.   Patient Denies nocturnal pain.  Difficulty dressing/grooming: Reports Difficulty climbing stairs: Denies Difficulty getting out of chair: Denies Difficulty using hands for taps, buttons, cutlery, and/or writing: Reports  Review of Systems  Constitutional:  Positive for fatigue.  HENT:  Negative for mouth sores and mouth dryness.   Eyes:  Negative for pain and dryness.  Respiratory:  Negative for shortness of breath.   Cardiovascular:  Negative for chest pain and palpitations.  Gastrointestinal:  Negative for blood in stool, constipation and diarrhea.  Endocrine: Negative for increased urination.  Genitourinary:  Negative for involuntary urination.  Musculoskeletal:  Positive for myalgias, muscle weakness, morning stiffness,  muscle tenderness and myalgias. Negative for joint pain, gait problem, joint pain and joint swelling.  Skin:  Positive for sensitivity to sunlight. Negative for color change and rash.  Allergic/Immunologic: Negative for susceptible to infections.  Neurological:  Positive for headaches. Negative for dizziness.  Hematological:  Negative for swollen glands.  Psychiatric/Behavioral:  Negative for depressed mood and sleep disturbance. The patient is not nervous/anxious.     PMFS History:  Patient Active Problem List   Diagnosis Date Noted   Encounter for general health examination 09/14/2023   Scleroderma (HCC) 06/15/2023   Mild intermittent asthma without complication 06/15/2023   Low HDL (under 40) 02/25/2023   Lymphedema 02/25/2023   Class 1 obesity due to excess calories without serious comorbidity with body mass index (BMI) of 31.0 to 31.9 in adult 02/25/2023   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   Raynaud's syndrome without gangrene 12/02/2021   History of asthma 11/06/2021   Vitamin D deficiency 11/06/2021   Prediabetes 11/06/2021   Essential hypertension 11/06/2021   Scarring alopecia 08/18/2016   Discoid lupus 08/13/2015    Past Medical History:  Diagnosis Date   Asthma    Dermatolysis    Hypertension    Lymphedema    dx by vascular doctor   Pre-diabetes     Family History  Problem Relation Age of Onset   Diabetes Father    Cancer Father        prostate cancer in her 49s.   Cancer Paternal Aunt  uncertain what type of cancer   Diabetes Paternal Grandmother    Alzheimer's disease Paternal Grandfather    High Cholesterol Brother    Diabetes Son    Autism Son    Colon cancer Neg Hx    Breast cancer Neg Hx    Past Surgical History:  Procedure Laterality Date   BREAST EXCISIONAL BIOPSY Left    BREAST SURGERY     COLONOSCOPY  03/2022   WISDOM TOOTH EXTRACTION     Social History   Social History Narrative   Not on file   Immunization History   Administered Date(s) Administered   DT (Pediatric) 05/07/1991   DTP 04-29-1978, 03/26/1978, 09/20/1978, 10/21/1979, 03/30/1985   Hep B, Unspecified 04/20/1996, 05/22/1996, 12/22/1996   Influenza Split 11/27/2010, 09/10/2011   Influenza,inj,Quad PF,6+ Mos 06/29/2013, 10/15/2014, 08/29/2015, 08/18/2016, 07/23/2021, 09/08/2022   Influenza-Unspecified 08/18/2005, 08/11/2006, 08/29/2007, 07/23/2009   MMR 05/30/1979, 07/07/1989, 06/10/1990   OPV 03/26/1978, 06/08/1978, 09/20/1978, 03/30/1985   PFIZER(Purple Top)SARS-COV-2 Vaccination 12/29/2019, 01/23/2020, 08/02/2020, 11/06/2022, 06/26/2023   PPD Test 05/30/1979, 04/19/1998   Pfizer Covid-19 Vaccine Bivalent Booster 52yrs & up 07/05/2021   Pneumococcal Polysaccharide-23 10/15/2014, 08/20/2021   Tdap 09/10/2011, 08/20/2021     Objective: Vital Signs: BP 117/76 (BP Location: Left Arm, Patient Position: Sitting, Cuff Size: Normal)   Pulse 80   Ht 5\' 8"  (1.727 m)   Wt 205 lb 6.4 oz (93.2 kg)   BMI 31.23 kg/m    Physical Exam Vitals and nursing note reviewed.  Constitutional:      Appearance: She is well-developed.  HENT:     Head: Normocephalic and atraumatic.  Eyes:     Conjunctiva/sclera: Conjunctivae normal.  Cardiovascular:     Rate and Rhythm: Normal rate and regular rhythm.     Heart sounds: Normal heart sounds.  Pulmonary:     Effort: Pulmonary effort is normal.     Breath sounds: Normal breath sounds.  Abdominal:     General: Bowel sounds are normal.     Palpations: Abdomen is soft.  Musculoskeletal:     Cervical back: Normal range of motion.  Skin:    General: Skin is warm and dry.     Capillary Refill: Capillary refill takes 2 to 3 seconds.     Comments: Nailbed capillary changes with dilated blood vessels was noted.  Sclerodactyly was noted.  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  Poikiloderma was noted.   Neurological:     Mental Status: She is alert and oriented to person, place, and time.   Psychiatric:        Behavior: Behavior normal.      Musculoskeletal Exam: C-spine has limited range of motion without rotation.  Thoracic kyphosis noted.  Shoulder joint abduction to about 120 degrees bilaterally.  Flexion contractures in both elbows noted.  Wrist joints have good range of motion with no tenderness or joint swelling.  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  Loss of digital tufts noted.  No digital ulcerations.  No calcinosis cutis noted.  Hip joints have good range of motion with no groin pain.  Knee joints have good range of motion with no warmth or effusion.  Severe lymphedema noted in bilateral lower extremities.  CDAI Exam: CDAI Score: -- Patient Global: --; Provider Global: -- Swollen: --; Tender: -- Joint Exam 09/23/2023   No joint exam has been documented for this visit   There is currently no information documented on the homunculus. Go to the Rheumatology activity and complete the homunculus  joint exam.  Investigation: No additional findings.  Imaging: No results found.  Recent Labs: Lab Results  Component Value Date   WBC 6.2 09/14/2023   HGB 12.6 09/14/2023   PLT 282 09/14/2023   NA 138 09/14/2023   K 4.0 09/14/2023   CL 102 09/14/2023   CO2 22 09/14/2023   GLUCOSE 189 (H) 09/14/2023   BUN 8 09/14/2023   CREATININE 0.47 (L) 09/14/2023   BILITOT 0.5 09/14/2023   ALKPHOS 62 09/14/2023   AST 31 09/14/2023   ALT 22 09/14/2023   PROT 7.5 09/14/2023   ALBUMIN 4.2 09/14/2023   CALCIUM 9.1 09/14/2023   GFRAA >60 12/28/2019   QFTBGOLDPLUS NEGATIVE 11/06/2021    Speciality Comments: Dx in 2005-presented postpartum '04. Left bicep muscle bx 2005 + skin bx+. IVIG in the past.Ttd with MTX, PLQ and MMF. Off all meds since 2018  Procedures:  No procedures performed Allergies: Prunus persica, Cat hair extract, Mixed ragweed, and Pollinex-t [modified tree tyrosine adsorbate]      Assessment / Plan:     Visit Diagnoses: Adult onset  dermatomyositis (HCC) - Dx in 2005-presented postpartum '04. Left bicep muscle bx 2005 + skin bx+. IVIG, muscle wasting, poikiloderma, sclerodactyly, calcinosis, capillary changes: She has not had any signs or symptoms of a myositis flare since her last office visit.  She has not noticed any increased muscular weakness or deconditioning.  She has not had any increased joint pain or inflammation.  No synovitis was noted on examination today.  She was able to rise from a seated position without difficulty.  Patient remains on methotrexate 8 tablets by mouth once weekly and folic acid 2 mg daily. CK was 417 on 05/11/23.  Plan to check CK today. Discussed the importance of keeping up with age-related cancer screenings given history of dermatomyositis.  Colonoscopy updated on 04/07/2022. Upcoming appointment scheduled with gynecology on 01/11/2024. She will notify us if she develops signs or symptoms of a flare.  She will follow-up in the office in 4 months or sooner if needed.  - Plan: CK  High risk medication use - Methotrexate 8 tablets by mouth once weekly and folic acid 2 mg daily-gap in therapy from 2018 until restarting in February 2023. CBC and CMP updated on 09/14/23. Her next lab work will be due in March and every 3 months.  Discussed the importance of holding methotrexate if she develops signs or symptoms of an infection and to resume once the infection has completely cleared.   Patient received the annual flu shot and updated COVID-vaccine in September 2024.  Scleroderma (HCC) - Serology was negative for scleroderma, although her clinical features are consistent with scleroderma: Patient has not noticed any progression in symptoms.  Sclerodactyly appears to be unchanged.  She continues to have symptoms of Raynaud's phenomenon and has been using a space heater at home as well as gloves if outdoors.  She remains on amlodipine 5 mg 1 tablet daily.  No digital ulcerations or signs of calcinosis cutis noted  today. Reviewed office visit note with pulmonologist on 06/15/2023-minimally symptomatic.  Updated PFTs on 06/15/2023-- decline in FEV1 compared to PFTs from June 2023.  Chest CT from February 2023 revealed mild fibrotic changes in the lung.  High-resolution chest CT ordered on 06/22/2023: No evidence of interstitial lung disease.  Air-trapping is indicative of small airway disease. No changes recommended since the patient is minimally symptomatic.  She has not noticed any new or worsening pulmonary symptoms.  Patient was evaluated by  cardiology--Dr. Eldridge Dace on 06/10/2023--no changes made and recommended at follow-up in 1 year-reviewed OV note. Previous echo January 2023: Normal LV function, normal valvular function.  Normal RV function. No evidence of pulmonary hypertension.  No repeat echocardiogram recommended by cardiology at this time. Blood pressure was rechecked while the patient was in the office and her blood pressure returned to normal: 117/76 today.  Discussed the importance of close blood pressure monitoring due to the concern for renal crisis.  She is taking amlodipine 5 mg 1 tablet daily.  Encouraged patient to try to avoid the use of prednisone.  No signs of inflammatory arthritis noted today.  She remains on methotrexate 8 tablets by mouth once weekly and folic acid 2 mg daily. She will notify us if she develops any new or worsening symptoms.  She will follow-up in the office in 4 months or sooner if needed.  Raynaud's syndrome without gangrene: She continues to have intermittent symptoms of Raynaud's phenomenon.  Capillary refill 2 to 3 seconds.  No signs of calcinosis cutis or digital ulcerations noted.  She is taking amlodipine 5 mg 1 tablet daily. She will notify us if she develops any new or worsening symptoms.  Shortness of breath -No new or worsening pulmonary symptoms.  High-resolution chest CT updated on 06/22/2023 did not reveal any findings consistent with ILD.  Pain in both  hands: She has no joint tenderness or synovitis on examination today.  Other medical conditions are listed as follows:  Pedal edema: Patient has severe lymphedema involving bilateral lower extremities.  Essential hypertension: Blood pressure was 117/76 today in the office.  History of asthma  Vitamin D deficiency: She is taking vitamin D 50,000 units once weekly.  Other fatigue  Prediabetes  Bilateral swelling of feet and ankles: Significant lymphedema involving bilateral lower extremities.  Orders: Orders Placed This Encounter  Procedures   CK   No orders of the defined types were placed in this encounter.  Follow-Up Instructions: Return in about 4 months (around 01/22/2024) for Dermtomyositis.   Gearldine Bienenstock, PA-C  Note - This record has been created using Dragon software.  Chart creation errors have been sought, but may not always  have been located. Such creation errors do not reflect on  the standard of medical care.

## 2023-09-23 ENCOUNTER — Other Ambulatory Visit: Payer: Self-pay

## 2023-09-23 ENCOUNTER — Ambulatory Visit: Payer: BC Managed Care – PPO | Attending: Physician Assistant | Admitting: Physician Assistant

## 2023-09-23 ENCOUNTER — Encounter: Payer: Self-pay | Admitting: Physician Assistant

## 2023-09-23 VITALS — BP 117/76 | HR 80 | Ht 68.0 in | Wt 205.4 lb

## 2023-09-23 DIAGNOSIS — M79641 Pain in right hand: Secondary | ICD-10-CM

## 2023-09-23 DIAGNOSIS — R0602 Shortness of breath: Secondary | ICD-10-CM | POA: Diagnosis not present

## 2023-09-23 DIAGNOSIS — I10 Essential (primary) hypertension: Secondary | ICD-10-CM

## 2023-09-23 DIAGNOSIS — R5383 Other fatigue: Secondary | ICD-10-CM | POA: Diagnosis not present

## 2023-09-23 DIAGNOSIS — Z79899 Other long term (current) drug therapy: Secondary | ICD-10-CM | POA: Diagnosis not present

## 2023-09-23 DIAGNOSIS — R600 Localized edema: Secondary | ICD-10-CM | POA: Diagnosis not present

## 2023-09-23 DIAGNOSIS — M79642 Pain in left hand: Secondary | ICD-10-CM

## 2023-09-23 DIAGNOSIS — M25471 Effusion, right ankle: Secondary | ICD-10-CM

## 2023-09-23 DIAGNOSIS — R7303 Prediabetes: Secondary | ICD-10-CM | POA: Diagnosis not present

## 2023-09-23 DIAGNOSIS — M25475 Effusion, left foot: Secondary | ICD-10-CM

## 2023-09-23 DIAGNOSIS — Z8709 Personal history of other diseases of the respiratory system: Secondary | ICD-10-CM

## 2023-09-23 DIAGNOSIS — E559 Vitamin D deficiency, unspecified: Secondary | ICD-10-CM

## 2023-09-23 DIAGNOSIS — I7300 Raynaud's syndrome without gangrene: Secondary | ICD-10-CM | POA: Diagnosis not present

## 2023-09-23 DIAGNOSIS — M25472 Effusion, left ankle: Secondary | ICD-10-CM

## 2023-09-23 DIAGNOSIS — M3310 Other dermatopolymyositis, organ involvement unspecified: Secondary | ICD-10-CM | POA: Diagnosis not present

## 2023-09-23 DIAGNOSIS — M349 Systemic sclerosis, unspecified: Secondary | ICD-10-CM | POA: Diagnosis not present

## 2023-09-23 DIAGNOSIS — M25474 Effusion, right foot: Secondary | ICD-10-CM

## 2023-09-23 MED ORDER — METHOTREXATE SODIUM 2.5 MG PO TABS: ORAL_TABLET | ORAL | 0 refills | Status: DC

## 2023-09-23 NOTE — Patient Instructions (Signed)
 Standing Labs We placed an order today for your standing lab work.   Please have your standing labs drawn in march and every 3 months   Please have your labs drawn 2 weeks prior to your appointment so that the provider can discuss your lab results at your appointment, if possible.  Please note that you may see your imaging and lab results in MyChart before we have reviewed them. We will contact you once all results are reviewed. Please allow our office up to 72 hours to thoroughly review all of the results before contacting the office for clarification of your results.  WALK-IN LAB HOURS  Monday through Thursday from 8:00 am -12:30 pm and 1:00 pm-5:00 pm and Friday from 8:00 am-12:00 pm.  Patients with office visits requiring labs will be seen before walk-in labs.  You may encounter longer than normal wait times. Please allow additional time. Wait times may be shorter on  Monday and Thursday afternoons.  We do not book appointments for walk-in labs. We appreciate your patience and understanding with our staff.   Labs are drawn by [facility]. Please bring your co-pay at the time of your lab draw.  You may receive a bill from [facility] for your lab work.  Please note if you are on Hydroxychloroquine and and an order has been placed for a Hydroxychloroquine level,  you will need to have it drawn 4 hours or more after your last dose.  If you wish to have your labs drawn at another location, please call the office 24 hours in advance so we can fax the orders.  The office is located at [address], [address], [place], [place] [address]   If you have any questions regarding directions or hours of operation,  please call [phone].   As a reminder, please drink plenty of water prior to coming for your lab work. Thanks!

## 2023-09-24 LAB — CK: Total CK: 377 U/L — ABNORMAL HIGH (ref 29–143)

## 2023-09-24 NOTE — Progress Notes (Signed)
CK remains elevated but has improved slightly.  Please notify the patient.

## 2023-09-30 ENCOUNTER — Other Ambulatory Visit: Payer: BC Managed Care – PPO

## 2023-09-30 DIAGNOSIS — N289 Disorder of kidney and ureter, unspecified: Secondary | ICD-10-CM | POA: Diagnosis not present

## 2023-10-01 ENCOUNTER — Other Ambulatory Visit: Payer: Self-pay

## 2023-10-01 DIAGNOSIS — F4310 Post-traumatic stress disorder, unspecified: Secondary | ICD-10-CM | POA: Diagnosis not present

## 2023-10-01 DIAGNOSIS — F4320 Adjustment disorder, unspecified: Secondary | ICD-10-CM | POA: Diagnosis not present

## 2023-10-01 LAB — BMP8+EGFR
BUN/Creatinine Ratio: 20 (ref 9–23)
BUN: 9 mg/dL (ref 6–24)
CO2: 24 mmol/L (ref 20–29)
Calcium: 9.4 mg/dL (ref 8.7–10.2)
Chloride: 100 mmol/L (ref 96–106)
Creatinine, Ser: 0.46 mg/dL — ABNORMAL LOW (ref 0.57–1.00)
Glucose: 140 mg/dL — ABNORMAL HIGH (ref 70–99)
Potassium: 4.2 mmol/L (ref 3.5–5.2)
Sodium: 137 mmol/L (ref 134–144)
eGFR: 120 mL/min/{1.73_m2} (ref >59)

## 2023-10-14 DIAGNOSIS — F4310 Post-traumatic stress disorder, unspecified: Secondary | ICD-10-CM | POA: Diagnosis not present

## 2023-10-14 DIAGNOSIS — F4320 Adjustment disorder, unspecified: Secondary | ICD-10-CM | POA: Diagnosis not present

## 2023-10-21 DIAGNOSIS — F4310 Post-traumatic stress disorder, unspecified: Secondary | ICD-10-CM | POA: Diagnosis not present

## 2023-10-21 DIAGNOSIS — F4320 Adjustment disorder, unspecified: Secondary | ICD-10-CM | POA: Diagnosis not present

## 2023-10-27 DIAGNOSIS — F4320 Adjustment disorder, unspecified: Secondary | ICD-10-CM | POA: Diagnosis not present

## 2023-10-27 DIAGNOSIS — F4310 Post-traumatic stress disorder, unspecified: Secondary | ICD-10-CM | POA: Diagnosis not present

## 2023-11-03 DIAGNOSIS — F4310 Post-traumatic stress disorder, unspecified: Secondary | ICD-10-CM | POA: Diagnosis not present

## 2023-11-03 DIAGNOSIS — F4320 Adjustment disorder, unspecified: Secondary | ICD-10-CM | POA: Diagnosis not present

## 2023-11-10 DIAGNOSIS — F4320 Adjustment disorder, unspecified: Secondary | ICD-10-CM | POA: Diagnosis not present

## 2023-11-10 DIAGNOSIS — F4310 Post-traumatic stress disorder, unspecified: Secondary | ICD-10-CM | POA: Diagnosis not present

## 2023-11-17 DIAGNOSIS — F4310 Post-traumatic stress disorder, unspecified: Secondary | ICD-10-CM | POA: Diagnosis not present

## 2023-11-17 DIAGNOSIS — F4320 Adjustment disorder, unspecified: Secondary | ICD-10-CM | POA: Diagnosis not present

## 2023-11-24 DIAGNOSIS — F4320 Adjustment disorder, unspecified: Secondary | ICD-10-CM | POA: Diagnosis not present

## 2023-11-24 DIAGNOSIS — F4310 Post-traumatic stress disorder, unspecified: Secondary | ICD-10-CM | POA: Diagnosis not present

## 2023-12-02 DIAGNOSIS — F4320 Adjustment disorder, unspecified: Secondary | ICD-10-CM | POA: Diagnosis not present

## 2023-12-02 DIAGNOSIS — F4310 Post-traumatic stress disorder, unspecified: Secondary | ICD-10-CM | POA: Diagnosis not present

## 2023-12-06 ENCOUNTER — Encounter: Payer: Self-pay | Admitting: Nurse Practitioner

## 2023-12-06 ENCOUNTER — Other Ambulatory Visit: Payer: Self-pay

## 2023-12-06 MED ORDER — MUPIROCIN 2 % EX OINT: 1.0000 | TOPICAL_OINTMENT | Freq: Two times a day (BID) | CUTANEOUS | 3 refills | Status: AC

## 2023-12-08 DIAGNOSIS — F4310 Post-traumatic stress disorder, unspecified: Secondary | ICD-10-CM | POA: Diagnosis not present

## 2023-12-08 DIAGNOSIS — F4320 Adjustment disorder, unspecified: Secondary | ICD-10-CM | POA: Diagnosis not present

## 2023-12-15 DIAGNOSIS — F4320 Adjustment disorder, unspecified: Secondary | ICD-10-CM | POA: Diagnosis not present

## 2023-12-15 DIAGNOSIS — F4310 Post-traumatic stress disorder, unspecified: Secondary | ICD-10-CM | POA: Diagnosis not present

## 2023-12-22 ENCOUNTER — Other Ambulatory Visit: Payer: Self-pay | Admitting: Nurse Practitioner

## 2023-12-23 DIAGNOSIS — F4310 Post-traumatic stress disorder, unspecified: Secondary | ICD-10-CM | POA: Diagnosis not present

## 2023-12-23 DIAGNOSIS — F4320 Adjustment disorder, unspecified: Secondary | ICD-10-CM | POA: Diagnosis not present

## 2023-12-28 NOTE — Progress Notes (Signed)
 48 y.o. G1P1 Married [place] American female here for annual exam.    Has a tiny bit of bleeding randomly.   Currently has a cold.   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  Her wife cleans professionally.  There are 10 people living at their home right now.   PCP: Arnette Felts, FNP   No LMP recorded. (Menstrual status: IUD).           Sexually active: Yes.    The current method of family planning is IUD--Mirena 2019.    Menopausal hormone therapy:  IUD Exercising: Yes.     Youtube workouts/ walking as well Smoker:  no  OB History  Gravida Para Term Preterm AB Living  1 1    1   SAB IAB Ectopic Multiple Live Births          # Outcome Date GA Lbr Len/2nd Weight Sex Type Anes PTL Lv  1 Para              HEALTH MAINTENANCE: Last 2 paps:  08/20/22 neg: HR HPV neg History of abnormal Pap or positive HPV:  yes.  May have had an abnormal pap or two.  No colposcopy or treatment.  Mammogram:   02/18/23 Breast Density Cat C, BI-RADS CAT 1 neg.  Is scheduled for August.  Colonoscopy:  04/07/22 - due in 2033.  Bone Density:  n/a  Result  n/a   Immunization History  Administered Date(s) Administered   DT (Pediatric) 05/07/1991   DTP 1978/02/15, 03/26/1978, 09/20/1978, 10/21/1979, 03/30/1985   Hep B, Unspecified 04/20/1996, 05/22/1996, 12/22/1996   Influenza Split 11/27/2010, 09/10/2011   Influenza,inj,Quad PF,6+ Mos 06/29/2013, 10/15/2014, 08/29/2015, 08/18/2016, 07/23/2021, 09/08/2022   Influenza-Unspecified 08/18/2005, 08/11/2006, 08/29/2007, 07/23/2009   MMR 05/30/1979, 07/07/1989, 06/10/1990   OPV 03/26/1978, 06/08/1978, 09/20/1978, 03/30/1985   PFIZER(Purple Top)SARS-COV-2 Vaccination 12/29/2019, 01/23/2020, 08/02/2020, 11/06/2022, 06/26/2023   PPD Test 05/30/1979, 04/19/1998   Pfizer Covid-19 Vaccine Bivalent Booster 98yrs & up 07/05/2021   Pneumococcal Polysaccharide-23 10/15/2014, 08/20/2021   Tdap 09/10/2011, 08/20/2021      reports that she has never smoked. She  has never been exposed to tobacco smoke. She has never used smokeless tobacco. She reports current alcohol use. She reports that she does not use drugs.  Past Medical History:  Diagnosis Date   Asthma    Dermatolysis    Hypertension    Lymphedema    dx by vascular doctor   Pre-diabetes     Past Surgical History:  Procedure Laterality Date   BREAST EXCISIONAL BIOPSY Left    BREAST SURGERY     COLONOSCOPY  03/2022   WISDOM TOOTH EXTRACTION      Current Outpatient Medications  Medication Sig Dispense Refill   albuterol (VENTOLIN HFA) 108 (90 Base) MCG/ACT inhaler Inhale 2 puffs into the lungs every 6 (six) hours as needed for wheezing or shortness of breath. 8 g 2   amLODipine (NORVASC) 5 MG tablet Take 1 tablet (5 mg total) by mouth daily. 90 tablet 1   chlorhexidine (PERIDEX) 0.12 % solution Use as directed 5 mLs in the mouth or throat daily.     fluticasone (FLONASE) 50 MCG/ACT nasal spray Place 2 sprays into both nostrils daily. 18.2 mL 2   folic acid (FOLVITE) 1 MG tablet TAKE 2 TABLETS(2 MG) BY MOUTH DAILY 180 tablet 3   hydrocortisone (ANUSOL-HC) 2.5 % rectal cream Place 1 Application rectally 2 (two) times daily. 30 g 1   hydrOXYzine (ATARAX) 25 MG tablet Take 1  tablet (25 mg total) by mouth 3 (three) times daily as needed. 270 tablet 1   methotrexate (RHEUMATREX) 2.5 MG tablet TAKE 8 TABLETS BY MOUTH ONE TIME PER WEEK 96 tablet 0   mupirocin ointment (BACTROBAN) 2 % Apply 1 Application topically 2 (two) times daily. 30 g 3   Vitamin D, Ergocalciferol, (DRISDOL) 1.25 MG (50000 UNIT) CAPS capsule Take 1 capsule (50,000 Units total) by mouth every 7 (seven) days. 24 capsule 1   No current facility-administered medications for this visit.    ALLERGIES: Prunus persica, Cat dander, Mixed ragweed, and Pollinex-t [modified tree tyrosine adsorbate]  Family History  Problem Relation Age of Onset   Diabetes Father    Cancer Father        prostate cancer in her 92s.   Cancer  Paternal Aunt        uncertain what type of cancer   Diabetes Paternal Grandmother    Alzheimer's disease Paternal Grandfather    High Cholesterol Brother    Diabetes Son    Autism Son    Colon cancer Neg Hx    Breast cancer Neg Hx     Review of Systems  All other systems reviewed and are negative.   PHYSICAL EXAM:  BP 124/76 (BP Location: Left Arm, Patient Position: Sitting, Cuff Size: Small)   Pulse 81   Ht 5\' 9"  (1.753 m)   Wt 210 lb (95.3 kg)   SpO2 97%   BMI 31.01 kg/m     General appearance: alert, cooperative and appears stated age Head: normocephalic, without obvious abnormality, atraumatic Neck: no adenopathy, supple, symmetrical, trachea midline and thyroid normal to inspection and palpation Lungs: clear to auscultation bilaterally Breasts: normal appearance, no masses or tenderness, No nipple retraction or dimpling, No nipple discharge or bleeding, No axillary adenopathy Heart: regular rate and rhythm Abdomen: distended, non-tender; no masses, no organomegaly Extremities:  lower extremity edema.  Skin:  patches of hypopigmentation of skin.  Lymph nodes: cervical, supraclavicular, and axillary nodes normal. Neurologic: grossly normal  Pelvic: External genitalia:  no lesions              No abnormal inguinal nodes palpated.              Urethra:  normal appearing urethra with no masses, tenderness or lesions              Bartholins and Skenes: normal                 Vagina: normal appearing vagina with normal color and discharge, no lesions              Cervix: no lesions.  IUD strings noted.               Pap taken: No. Bimanual Exam:  exam is limited by increased tone of abdominal wall.  Uterus:  normal size, contour, position, consistency, mobility, non-tender              Adnexa: no mass, fullness, tenderness              Rectal exam: Yes.  .  Confirms.              Anus:  normal sphincter tone, no lesions  Chaperone was present for exam:  Warren Lacy,  CMA  ASSESSMENT: Well woman visit with gynecologic exam. Mirena IUD placed 06/01/18. Fibroids.  Hx ovarian cysts.   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  Lymphedema.  PHQ-9: 0  PLAN: Mammogram screening discussed. Self breast awareness reviewed. Pap and HRV collected:  no.  Due in 2028.  Guidelines for Calcium, Vitamin D, regular exercise program including cardiovascular and weight bearing exercise. Medication refills:  NA. Return for pelvic ultrasound.  Follow up:  yearly and prn.

## 2023-12-31 DIAGNOSIS — F4320 Adjustment disorder, unspecified: Secondary | ICD-10-CM | POA: Diagnosis not present

## 2023-12-31 DIAGNOSIS — F4310 Post-traumatic stress disorder, unspecified: Secondary | ICD-10-CM | POA: Diagnosis not present

## 2024-01-04 ENCOUNTER — Encounter: Payer: Self-pay | Admitting: Obstetrics and Gynecology

## 2024-01-06 ENCOUNTER — Other Ambulatory Visit: Payer: Self-pay | Admitting: Nurse Practitioner

## 2024-01-07 ENCOUNTER — Other Ambulatory Visit: Payer: Self-pay | Admitting: Obstetrics and Gynecology

## 2024-01-07 DIAGNOSIS — F4320 Adjustment disorder, unspecified: Secondary | ICD-10-CM | POA: Diagnosis not present

## 2024-01-07 DIAGNOSIS — F4310 Post-traumatic stress disorder, unspecified: Secondary | ICD-10-CM | POA: Diagnosis not present

## 2024-01-07 DIAGNOSIS — Z1231 Encounter for screening mammogram for malignant neoplasm of breast: Secondary | ICD-10-CM

## 2024-01-11 ENCOUNTER — Encounter: Payer: Self-pay | Admitting: Obstetrics and Gynecology

## 2024-01-11 ENCOUNTER — Ambulatory Visit (INDEPENDENT_AMBULATORY_CARE_PROVIDER_SITE_OTHER): Payer: BC Managed Care – PPO | Admitting: Obstetrics and Gynecology

## 2024-01-11 VITALS — BP 124/76 | HR 81 | Ht 69.0 in | Wt 210.0 lb

## 2024-01-11 DIAGNOSIS — D219 Benign neoplasm of connective and other soft tissue, unspecified: Secondary | ICD-10-CM

## 2024-01-11 DIAGNOSIS — Z1331 Encounter for screening for depression: Secondary | ICD-10-CM

## 2024-01-11 DIAGNOSIS — Z8742 Personal history of other diseases of the female genital tract: Secondary | ICD-10-CM | POA: Diagnosis not present

## 2024-01-11 DIAGNOSIS — Z01419 Encounter for gynecological examination (general) (routine) without abnormal findings: Secondary | ICD-10-CM | POA: Diagnosis not present

## 2024-01-11 NOTE — Patient Instructions (Signed)

## 2024-01-12 DIAGNOSIS — F4320 Adjustment disorder, unspecified: Secondary | ICD-10-CM | POA: Diagnosis not present

## 2024-01-12 DIAGNOSIS — F4310 Post-traumatic stress disorder, unspecified: Secondary | ICD-10-CM | POA: Diagnosis not present

## 2024-01-25 ENCOUNTER — Ambulatory Visit: Payer: BC Managed Care – PPO | Admitting: Physician Assistant

## 2024-01-25 DIAGNOSIS — F4320 Adjustment disorder, unspecified: Secondary | ICD-10-CM | POA: Diagnosis not present

## 2024-01-25 DIAGNOSIS — F4310 Post-traumatic stress disorder, unspecified: Secondary | ICD-10-CM | POA: Diagnosis not present

## 2024-02-03 DIAGNOSIS — F4320 Adjustment disorder, unspecified: Secondary | ICD-10-CM | POA: Diagnosis not present

## 2024-02-07 ENCOUNTER — Encounter: Payer: Self-pay | Admitting: Obstetrics and Gynecology

## 2024-02-07 DIAGNOSIS — F4320 Adjustment disorder, unspecified: Secondary | ICD-10-CM | POA: Diagnosis not present

## 2024-02-07 DIAGNOSIS — F4310 Post-traumatic stress disorder, unspecified: Secondary | ICD-10-CM | POA: Diagnosis not present

## 2024-02-07 NOTE — Progress Notes (Unsigned)
 GYNECOLOGY  VISIT   HPI: 48 y.o.   Married  [place] female   G1P1 with No LMP recorded. (Menstrual status: IUD).   here for: PUS for h/o ovarian cysts.   Has dermatomyositis and increased risk of ovarian cancer.   GYNECOLOGIC HISTORY: No LMP recorded. (Menstrual status: IUD). Contraception: IUD-Mirena 2019 Menopausal hormone therapy: n/a Last 2 paps: 08/20/2022-WNL, HPV- neg History of abnormal Pap or positive HPV: yes. May have had an abnormal pap or two. No colposcopy or treatment.  Mammogram: 02/18/2023-WNL, Cat C, scheduled for 05/23/2024        OB History     Gravida  1   Para  1   Term      Preterm      AB      Living  1      SAB      IAB      Ectopic      Multiple      Live Births                 Patient Active Problem List   Diagnosis Date Noted   Encounter for general health examination 09/14/2023   Scleroderma (HCC) 06/15/2023   Mild intermittent asthma without complication 06/15/2023   Low HDL (under 40) 02/25/2023   Lymphedema 02/25/2023   Class 1 obesity due to excess calories without serious comorbidity with body mass index (BMI) of 31.0 to 31.9 in adult 02/25/2023   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   Raynaud's syndrome without gangrene 12/02/2021   History of asthma 11/06/2021   Vitamin D  deficiency 11/06/2021   Prediabetes 11/06/2021   Essential hypertension 11/06/2021   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 8 g 2   amLODipine  (NORVASC ) 5 MG tablet Take 1 tablet (5 mg total) by mouth daily. 90 tablet 1   ammonium lactate (AMLACTIN) 12 % cream Apply topically.     chlorhexidine (PERIDEX) 0.12 % solution Use as directed 5 mLs in the mouth or throat daily.     fluticasone  (FLONASE ) 50 MCG/ACT nasal spray Place 2 sprays into both nostrils daily. 18.2 mL 2   folic acid  (FOLVITE ) 1 MG tablet TAKE 2 TABLETS(2 MG) BY MOUTH DAILY 180 tablet 3   hydrocortisone  (ANUSOL -HC) 2.5 % rectal cream Place 1 Application rectally 2 (two) times daily. 30 g 1   hydrOXYzine  (ATARAX ) 25 MG tablet Take 1 tablet (25 mg total) by mouth 3 (three) times daily as needed. 270 tablet 1   methotrexate  (RHEUMATREX) 2.5 MG tablet TAKE 8 TABLETS BY MOUTH ONE TIME PER WEEK 96 tablet 0   mupirocin  ointment (BACTROBAN ) 2 % Apply 1 Application topically 2 (two) times daily. 30 g 3   Vitamin D , Ergocalciferol , (DRISDOL ) 1.25 MG (50000 UNIT) CAPS capsule Take 1 capsule (50,000 Units total) by mouth every 7 (seven)  days. 24 capsule 1   No current facility-administered medications for this visit.     ALLERGIES: Prunus persica, Cat dander, Mixed ragweed, and Pollinex-t [modified tree tyrosine adsorbate]  Family History  Problem Relation Age of Onset   Diabetes Father    Cancer Father        prostate cancer in her 61s.   Cancer Paternal Aunt        uncertain what type of cancer   Diabetes Paternal Grandmother    Alzheimer's disease Paternal Grandfather    High Cholesterol Brother    Diabetes Son    Autism Son    Colon cancer Neg Hx    Breast cancer Neg Hx     Social History   Socioeconomic History   Marital status: Married    Spouse name: Not on file   Number of children: 1   Years of education: Not on file   Highest education level: Bachelor's degree (e.g., BA, AB, BS)  Occupational History   Not on file  Tobacco Use   Smoking status: Never    Passive exposure: Never   Smokeless  tobacco: Never  Vaping Use   Vaping status: Never Used  Substance and Sexual Activity   Alcohol use: Yes    Comment: 1-2 drinks/months   Drug use: Never   Sexual activity: Yes    Birth control/protection: I.U.D.    Comment: Mirena (06/01/2018)  Other Topics Concern   Not on file  Social History Narrative   Not on file   Social Drivers of Health   Financial Resource Strain: High Risk (02/21/2023)   Overall Financial Resource Strain (CARDIA)    Difficulty of Paying Living Expenses: Hard  Food Insecurity: Food Insecurity Present (02/21/2023)   Hunger Vital Sign    Worried About Running Out of Food in the Last Year: Sometimes true    Ran Out of Food in the Last Year: Sometimes true  Transportation Needs: No Transportation Needs (02/21/2023)   PRAPARE - Administrator, Civil Service (Medical): No    Lack of Transportation (Non-Medical): No  Physical Activity: Insufficiently Active (02/21/2023)   Exercise Vital Sign    Days of Exercise per Week: 5 days    Minutes of Exercise per Session: 20 min  Stress: No Stress Concern Present (02/21/2023)   [employer] of Occupational Health - Occupational Stress Questionnaire    Feeling of Stress : Only a little  Social Connections: Moderately Integrated (02/21/2023)   Social Connection and Isolation Panel [NHANES]    Frequency of Communication with Friends and Family: Once a week    Frequency of Social Gatherings with Friends and Family: Once a week    Attends Religious Services: More than 4 times per year    Active Member of [employer] or Organizations: Yes    Attends Engineer, structural: More than 4 times per year    Marital Status: Married  Catering manager Violence: Not At Risk (06/14/2018)   Received from [facility]   Humiliation, Afraid, Rape, and Kick questionnaire    Fear of Current or Ex-Partner: No    Emotionally Abused: No    Physically Abused: No    Sexually Abused: No    Review of  Systems  All other systems reviewed and are negative.   PHYSICAL EXAMINATION:   BP 110/78   Pulse 80   SpO2 96%     General appearance: alert, cooperative and appears stated age   Pelvic US   Uterus 10.96 x 5.42 x 5.58 cm.  Multiple fibroids - subserosal and intramural:  1.76 cm, 2.09 cm, 1.52 cm, 2.73 cm, 1.26 cm.  EMS 3.61 mm.  IUD located in normal position in endometrial canal.  Left ovary 3.56 x 2.55 x 3.04 cm.  Follicles noted.  Right ovary 4.91 x 4.52 x 3.90 cm.   37 x 29 mm simple avascular cyst.  21 x 16 mm complex cyst.  Follicle also noted.  No adnexal masses.  Small amount of cul de sac fluid.   ASSESSMENT:  Complex right ovarian cyst.  Simple right ovarian cyst.  Fibroid uterus.  At increased risk of ovarian cancer.   PLAN:  Pelvic US  images and report reviewed.  Fibroids and ovarian cysts discussed.  Signs and symptoms of ovarian cancer reviewed.  FU US  in 6 weeks to recheck complex right ovarian cyst.   23 min  total time was spent for this patient encounter, including preparation, face-to-face counseling with the patient, coordination of care, and documentation of the encounter.

## 2024-02-08 ENCOUNTER — Encounter: Payer: Self-pay | Admitting: Obstetrics and Gynecology

## 2024-02-08 ENCOUNTER — Ambulatory Visit (INDEPENDENT_AMBULATORY_CARE_PROVIDER_SITE_OTHER)

## 2024-02-08 ENCOUNTER — Ambulatory Visit: Admitting: Obstetrics and Gynecology

## 2024-02-08 VITALS — BP 110/78 | HR 80

## 2024-02-08 DIAGNOSIS — N83201 Unspecified ovarian cyst, right side: Secondary | ICD-10-CM | POA: Diagnosis not present

## 2024-02-08 DIAGNOSIS — D219 Benign neoplasm of connective and other soft tissue, unspecified: Secondary | ICD-10-CM | POA: Diagnosis not present

## 2024-02-08 DIAGNOSIS — Z8742 Personal history of other diseases of the female genital tract: Secondary | ICD-10-CM

## 2024-02-08 NOTE — Patient Instructions (Signed)
 Ovarian Cyst  An ovarian cyst is a fluid-filled sac that forms on an ovary. The ovaries are small organs that produce eggs in females. Many types of cysts can form on the ovaries. Most of these cysts go away on their own and are not cancer. Some cysts need treatment. What are the causes? Ovarian cysts may be caused by: Chemical or hormone changes in your body during your normal monthly period. Polycystic ovarian syndrome (PCOS). This is a common problem of the hormones. Endometriosis. The tissue that lines the inside of the uterus grows outside of the uterus. If it grows on your ovaries, it can form cysts. Pregnancy. Your body makes more hormones than normal to support the pregnancy. This can form cysts. Infection. Infection in your uterus can spread to your ovaries and can form cysts. What increases the risk? You are more likely to get this condition if: You take medicines to help you get pregnant. You have family members who have ovarian cysts. What are the signs or symptoms? Many ovarian cysts do not cause symptoms. If you have symptoms, they may include: Pain or pressure around the pelvis. The pelvis is the area between the hip bones. Pain in the lower belly. Pain during sex. Swelling in the lower belly. Periods that don't come at the same time each month. Pain during a period. How is this diagnosed? These cysts are found during a routine exam of the pelvis. You may have other tests to find out more about the cysts. Tests include ultrasound, CT scan, MRI, and blood tests. How is this treated? Many ovarian cysts go away on their own without treatment. When treatment is needed, it may include: Medicines to help treat pain. A procedure to drain the cyst. Surgery to take out the cyst. Hormones or birth control pills. Surgery to take out the ovary. Follow these instructions at home: Take all medicines only as told by your health care provider. If told, get Pap tests and regular exams  of the pelvis. Do not smoke, vape, or use nicotine or tobacco. Return to your normal activities when your provider says that it's safe. Keep all follow-up visits. Your provider may want to check your cyst for 2-3 months to see if it changes. If you're in menopause, it's important to have your cyst checked closely because females in this age group have a higher rate of cancer of the ovaries. Contact a health care provider if: You have any new symptoms. Your symptoms get worse. Get help right away if: You have really bad pain in the belly or pelvis, and the pain comes on all of a sudden. You have heavy bleeding that soaks through more than one pad every hour. This information is not intended to replace advice given to you by your health care provider. Make sure you discuss any questions you have with your health care provider. Document Revised: 07/01/2023 Document Reviewed: 01/26/2023 Elsevier Patient Education  2024 Elsevier Inc.  Fibroids in the Uterus: What to Know  Fibroids are growths (tumors) in the uterus. Fibroids are not cancer. Most people with this condition do not need treatment. Sometimes, fibroids can make it hard to get pregnant. If this happens, you may need surgery to take out the fibroids. What are the causes? The cause of this condition is not known. What increases the risk? You're in your 30s or 40s and have not stopped having a period. You have family members who have had fibroids. You're of African American descent. You started  your period at age 84 or younger. You've not given birth. You're overweight or you're obese. You eat a diet low in fruits, vegetables, and vitamin D . What are the signs or symptoms? Heavy bleeding during your period. Bleeding between periods. Pain in the area between your hips (pelvis). Pain during sex. Needing to pee right away or peeing more often than usual. Not being able to have children. Not being able to stay pregnant  (miscarriage). Many people do not have symptoms. How is this treated? Treatment may include: Medicines to help with pain, such as aspirin or ibuprofen. Hormone treatment. This may be given as a pill, in a shot, or with a type of birth control device called an IUD. Surgery. This may be done to: Take out the fibroids. This may be done if you want to become pregnant. Take out the uterus. Stop the blood flow to the fibroids. Procedures to shrink the fibroids. This may be done with: Heat and radio energy. Ultrasound waves. Follow these instructions at home: Medicines Take your medicines only as told. You can lose a lot of iron because of heavy bleeding from your period. Ask your doctor if you should: Take iron pills. Eat more foods that have iron in them, such as dark green, leafy vegetables. Managing pain  Put heat on your back or belly as told. Use the heat source that your doctor recommends, such as a moist heat pack or a heating pad. Do this as often as told. Put a towel between your skin and the heat source. Leave the heat on for 20-30 minutes. If your skin turns red, take off the heat right away to prevent burns. The risk of burns is higher if you can't feel pain, heat, or cold. General instructions Tell your doctor about any changes in your period, such as: Heavy bleeding that needs a change of tampons or pads more than normal. A change in how many days your period lasts. A change in symptoms that come with your period. This might be cramps in your belly or pain in your back. Keep all follow-up visits. Your doctor needs to check your fibroids for any changes. Contact a doctor if: You have pain that does not get better with medicine or heat. This may include pain or cramps in: The area between your hip bones. Your back. Your belly. You have new bleeding between your periods. You have more bleeding during or between your periods. You feel very tired or weak. You feel  dizzy. Get help right away if: You faint. You have pain in the area between your hip bones that gets worse. You have bleeding that soaks a tampon or pad in 30 minutes or less. This information is not intended to replace advice given to you by your health care provider. Make sure you discuss any questions you have with your health care provider. Document Revised: 03/30/2023 Document Reviewed: 03/30/2023 Elsevier Patient Education  2024 [employer].

## 2024-02-15 NOTE — Progress Notes (Signed)
 Office Visit Note  Patient: Patricia Krause             Date of Birth: 08/05/1976           MRN: [id number]             PCP: Susanna Epley, FNP Referring: Susanna Epley, FNP Visit Date: 02/29/2024 Occupation: @GUAROCC @  Subjective:  Medication monitoring   History of Present Illness: Patricia Krause is a 48 y.o. female with a past medical history of dermatomyositis and scleroderma.  Patient remains on Methotrexate  8 tablets by mouth once weekly and folic acid  2 mg daily.  She is tolerating methotrexate  without any side effects.  Patient states that through the winter months in spring she has had recurrent infections which she attributes to having young kids in the home.  Patient states that she has often times forgotten to hold her methotrexate  while recovering from the infection so she has actually not had frequent gaps in therapy.  Patient denies any new or worsening symptoms since her last office visit.  She denies any signs or symptoms of a flare.  She denies any increased muscular weakness.  She has not noticed any increase skin tightness or thickening.  Her symptoms of Raynaud's phenomenon have improved with warmer weather temperatures.  She denies any new rashes.  She denies any oral or nasal ulcerations.  She denies any new or worsening pulmonary symptoms.  She denies any dysphagia or increased reflux.  Patient has occasional discomfort in her knee joints but denies any warmth or swelling.  Patient reports that she was recently evaluated by her gynecologist.  Patient states that she will require a repeat ultrasound in June 2025 due to a suspicious cyst in her uterus.  Patient states that she is scheduled for a mammogram in August 2025.  She is also scheduled to see the dentist in July 2025.   Activities of Daily Living:  Patient reports morning stiffness for 5-10 minutes.   Patient Denies nocturnal pain.  Difficulty dressing/grooming: Reports Difficulty climbing stairs:  Reports Difficulty getting out of chair: Denies Difficulty using hands for taps, buttons, cutlery, and/or writing: Reports  Review of Systems  Constitutional:  Positive for fatigue.  HENT:  Positive for mouth dryness. Negative for mouth sores.   Eyes:  Negative for dryness.  Respiratory:  Negative for shortness of breath.   Cardiovascular:  Negative for chest pain and palpitations.  Gastrointestinal:  Positive for diarrhea. Negative for blood in stool and constipation.  Endocrine: Positive for increased urination.  Genitourinary:  Negative for involuntary urination.  Musculoskeletal:  Positive for gait problem, myalgias, muscle weakness, morning stiffness, muscle tenderness and myalgias. Negative for joint pain, joint pain and joint swelling.  Skin:  Positive for sensitivity to sunlight. Negative for color change, rash and hair loss.  Allergic/Immunologic: Positive for susceptible to infections.  Neurological:  Positive for headaches. Negative for dizziness.  Hematological:  Positive for swollen glands.  Psychiatric/Behavioral:  Negative for depressed mood and sleep disturbance. The patient is not nervous/anxious.     PMFS History:  Patient Active Problem List   Diagnosis Date Noted   Encounter for general health examination 09/14/2023   Scleroderma (HCC) 06/15/2023   Mild intermittent asthma without complication 06/15/2023   Low HDL (under 40) 02/25/2023   Lymphedema 02/25/2023   Class 1 obesity due to excess calories without serious comorbidity with body mass index (BMI) of 31.0 to 31.9 in adult 02/25/2023   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  Raynaud's syndrome without gangrene 12/02/2021   History of asthma 11/06/2021   Vitamin D  deficiency 11/06/2021   Prediabetes 11/06/2021   Essential hypertension 11/06/2021   Scarring alopecia 08/18/2016   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   Cancer Paternal Aunt        uncertain what type of cancer   Diabetes Paternal Grandmother    Alzheimer's disease Paternal Grandfather    High Cholesterol Brother    Diabetes Son    Autism Son    Colon cancer Neg Hx    Breast cancer Neg Hx    Past Surgical History:  Procedure Laterality Date   BREAST EXCISIONAL BIOPSY Left    BREAST SURGERY     COLONOSCOPY  03/2022   WISDOM TOOTH EXTRACTION     Social History   Social History Narrative   Not on file   Immunization History  Administered Date(s) Administered   DT (Pediatric) 05/07/1991   DTP 1976-02-28, 03/26/1978, 09/20/1978, 10/21/1979, 03/30/1985   Hep B, Unspecified 04/20/1996, 05/22/1996, 12/22/1996   Influenza Split 11/27/2010, 09/10/2011   Influenza,inj,Quad PF,6+ Mos 06/29/2013, 10/15/2014, 08/29/2015, 08/18/2016, 07/23/2021, 09/08/2022   Influenza-Unspecified 08/18/2005, 08/11/2006, 08/29/2007, 07/23/2009   MMR 05/30/1979, 07/07/1989, 06/10/1990   OPV 03/26/1978, 06/08/1978, 09/20/1978, 03/30/1985   PFIZER(Purple Top)SARS-COV-2 Vaccination 12/29/2019, 01/23/2020, 08/02/2020, 11/06/2022, 06/26/2023   PPD Test 05/30/1979, 04/19/1998   Pfizer Covid-19 Vaccine Bivalent Booster 23yrs & up 07/05/2021   Pneumococcal Polysaccharide-23 10/15/2014, 08/20/2021   Tdap 09/10/2011, 08/20/2021     Objective: Vital Signs: BP 123/73 (BP Location: Left Arm, Patient Position: Sitting, Cuff Size: Normal)   Pulse 80   Resp 16   Ht 5\' 8"  (1.727 m)   Wt 215 lb (97.5 kg)   BMI 32.69 kg/m    Physical Exam Vitals and nursing note reviewed.  Constitutional:      Appearance: She is well-developed.  HENT:     Head: Normocephalic and atraumatic.  Eyes:     Conjunctiva/sclera: Conjunctivae normal.  Cardiovascular:     Rate and Rhythm: Normal rate and  regular rhythm.     Heart sounds: Normal heart sounds.  Pulmonary:     Effort: Pulmonary effort is normal.     Breath sounds: Normal breath sounds.  Abdominal:     General: Bowel sounds are normal.     Palpations: Abdomen is soft.  Musculoskeletal:     Cervical back: Normal range of motion.  Lymphadenopathy:     Cervical: No cervical adenopathy.  Skin:    General: Skin is warm and dry.     Capillary Refill: Capillary refill takes 2 to 3 seconds.     Comments: Nailbed capillary changes with dilated blood vessels was noted.  Sclerodactyly was noted.  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  Poikiloderma was noted.    Neurological:     Mental Status: She is alert and oriented to person, place, and time.  Psychiatric:        Behavior: Behavior normal.      Musculoskeletal Exam: C-spine has limited range of motion with lateral rotation.  Thoracic kyphosis noted.  Limited shoulder abduction to about  120 degrees bilaterally.  Flexion contractures of both elbows noted.  No tenderness or inflammation along the wrist joints or MCP joints.  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  Loss of digital tufts noted.  No digital ulcerations noted.  No calcinosis cutis noted.  Hip joints have good range of motion with no groin pain.  Knee joints have good range of motion no warmth or effusion.  Severe lymphedema noted in bilateral lower extremities.  CDAI Exam: CDAI Score: -- Patient Global: --; Provider Global: -- Swollen: --; Tender: -- Joint Exam 02/29/2024   No joint exam has been documented for this visit   There is currently no information documented on the homunculus. Go to the Rheumatology activity and complete the homunculus joint exam.  Investigation: No additional findings.  Imaging: US  PELVIC COMPLETE WITH TRANSVAGINAL Result Date: 02/09/2024 Indication:  History of multiple fibroids.  At increased risk of ovarian cancer. Findings: Pelvic US  Uterus 10.96 x 5.42 x  5.58 cm. Multiple fibroids - subserosal and intramural:  1.76 cm, 2.09 cm, 1.52 cm, 2.73 cm, 1.26 cm. EMS 3.61 mm. IUD located in normal position in endometrial canal. Left ovary 3.56 x 2.55 x 3.04 cm.  Follicles noted. Right ovary 4.91 x 4.52 x 3.90 cm.   37 x 29 mm simple avascular cyst.  21 x 16 mm complex cyst.  Follicle also noted. No adnexal masses. Small amount of cul de sac fluid. Impression:  Fibroid uterus. Small complex right ovarian cyst. Simple right ovarian cyst.     Recent Labs: Lab Results  Component Value Date   WBC 6.2 09/14/2023   HGB 12.6 09/14/2023   PLT 282 09/14/2023   NA 137 09/30/2023   K 4.2 09/30/2023   CL 100 09/30/2023   CO2 24 09/30/2023   GLUCOSE 140 (H) 09/30/2023   BUN 9 09/30/2023   CREATININE 0.46 (L) 09/30/2023   BILITOT 0.5 09/14/2023   ALKPHOS 62 09/14/2023   AST 31 09/14/2023   ALT 22 09/14/2023   PROT 7.5 09/14/2023   ALBUMIN 4.2 09/14/2023   CALCIUM 9.4 09/30/2023   GFRAA >60 12/28/2019   QFTBGOLDPLUS NEGATIVE 11/06/2021    Speciality Comments: Dx in 2005-presented postpartum '04. Left bicep muscle bx 2005 + skin bx+. IVIG in the past.Ttd with MTX, PLQ and MMF. Off all meds since 2018  Procedures:  No procedures performed Allergies: Prunus persica, Cat dander, Mixed ragweed, and Pollinex-t [modified tree tyrosine adsorbate]     Assessment / Plan:     Visit Diagnoses: Adult onset dermatomyositis (HCC) - Dx in 2005-presented postpartum '04. Left bicep muscle bx 2005 + skin bx+. IVIG, muscle wasting, poikiloderma, sclerodactyly, calcinosis, capillary changes: She has not had any signs or symptoms of a dermatomyositis flare.  No muscular weakness at this time.  No difficulty when rising from a seated position. No new rashes.  Discussed the importance of avoiding UV exposure. She has clinically been doing well taking methotrexate  8 tablets by mouth once weekly and folic acid  2 mg daily.  No recent gaps in therapy. No synovitis was noted on  examination today. No new or worsening pulmonary symptoms.  High-resolution chest CT negative for signs of ILD in September 2024. Discussed increased risk for malignancy in patients with dermatomyositis.  Discussed the importance of remaining up-to-date with age-related cancer screenings.  Scheduled for mammogram in August 2025.  Scheduled for pelvic ultrasound in June 2025. She was advised to notify us  if she develops signs or symptoms of a dermatomyositis flare.  Plan to update CK today.  No medication changes will be made at this time.  She was advised to notify us  if she develops any signs or symptoms of a flare.  She will follow-up in the office in 4 months or sooner if needed. - Plan: CK  High risk medication use - Methotrexate  8 tablets by mouth once weekly and folic acid  2 mg daily-gap in therapy from 2018 until restarting in February 2023.  CBC and CMP updated on 09/14/23.  Orders for CBC and CMP released today.  Her next Abrol be due in August and every 3 months to monitor for drug toxicity. Recurrent infections through the winter months in early spring due to exposure to young kids at home.  Discussed the importance of holding methotrexate  if she develops signs or symptoms of an infection and to resume once the infection has completely cleared.   - Plan: CBC with Differential/Platelet, Comprehensive metabolic panel with GFR  Scleroderma (HCC) - Serology was negative for scleroderma, although her clinical features are consistent with scleroderma:   Infrequent symptoms of Raynaud's phenomenon. No increased dysphagia or GERD.   Blood pressure was well-controlled today in the office.  Discussed the importance of close blood pressure monitoring due to risk of renal crisis in patients with scleroderma. She is not experiencing any new or worsening pulmonary symptoms.  High-resolution chest CT did not reveal any evidence of ILD on 06/22/2023. Echocardiogram normal in 2023-evaluated by cardiology on  06/10/2023. Encouraged the patient to call pulmonologist and cardiologist to schedule routine follow-up in early fall 2025. Plan to update the following lab work today. She will remain on methotrexate  as prescribed.  She was advised to notify us  if she develops any new or worsening symptoms.- Plan: CBC with Differential/Platelet, Comprehensive metabolic panel with GFR, Urinalysis, Routine w reflex microscopic  Raynaud's syndrome without gangrene: She has had less frequent symptoms of Raynaud's phenomenon with warmer weather temperatures.  Shortness of breath -Not currently symptomatic.  No new or worsening pulmonary symptoms.   High resolution chest CT 06/22/23: no evidence of ILD.  PFTs updated on 06/15/23.  Cardiology visit 06/10/23--Dr. Jacquelynn Matter.  Echocardiogram normal in 2023.  Patient advised to call pulmonologist and cardiologist to schedule routine follow-up visits in early fall 2025.  Pain in both hands: She has no joint tenderness or synovitis on examination today.  Complete fist formation noted bilaterally.  Other medical conditions are listed as follows:   Pedal edema: Lymphedema involving bilateral lower extremities.  Essential hypertension: BP 123/73 today in the office.  Discussed the importance of close blood pressure monitoring.  Discussed the increased risk of renal crisis in patients with scleroderma.  History of asthma  Vitamin D  deficiency  Prediabetes  Other fatigue  Bilateral swelling of feet and ankles: Lymphedema involving bilateral lower extremities  Orders: Orders Placed This Encounter  Procedures   CBC with Differential/Platelet   Comprehensive metabolic panel with GFR   CK   Urinalysis, Routine w reflex microscopic   Meds ordered this encounter  Medications   methotrexate  (RHEUMATREX) 2.5 MG tablet    Sig: TAKE 8 TABLETS BY MOUTH ONE TIME PER WEEK    Dispense:  96 tablet    Refill:  0    Follow-Up Instructions: Return in about 4 months (around  07/01/2024) for Scleroderma, Dermatomyositis.   Romayne Clubs, PA-C  Note - This record has been created using Dragon software.  Chart creation errors have been sought, but may not always  have been located.  Such creation errors do not reflect on  the standard of medical care.

## 2024-02-23 ENCOUNTER — Other Ambulatory Visit: Payer: Self-pay | Admitting: Nurse Practitioner

## 2024-02-23 DIAGNOSIS — F4310 Post-traumatic stress disorder, unspecified: Secondary | ICD-10-CM | POA: Diagnosis not present

## 2024-02-23 DIAGNOSIS — F4320 Adjustment disorder, unspecified: Secondary | ICD-10-CM | POA: Diagnosis not present

## 2024-02-29 ENCOUNTER — Encounter: Payer: Self-pay | Admitting: Physician Assistant

## 2024-02-29 ENCOUNTER — Ambulatory Visit: Attending: Physician Assistant | Admitting: Physician Assistant

## 2024-02-29 VITALS — BP 123/73 | HR 80 | Resp 16 | Ht 68.0 in | Wt 215.0 lb

## 2024-02-29 DIAGNOSIS — M79641 Pain in right hand: Secondary | ICD-10-CM

## 2024-02-29 DIAGNOSIS — E559 Vitamin D deficiency, unspecified: Secondary | ICD-10-CM | POA: Diagnosis not present

## 2024-02-29 DIAGNOSIS — M349 Systemic sclerosis, unspecified: Secondary | ICD-10-CM

## 2024-02-29 DIAGNOSIS — R7303 Prediabetes: Secondary | ICD-10-CM

## 2024-02-29 DIAGNOSIS — M3310 Other dermatopolymyositis, organ involvement unspecified: Secondary | ICD-10-CM

## 2024-02-29 DIAGNOSIS — I7300 Raynaud's syndrome without gangrene: Secondary | ICD-10-CM | POA: Diagnosis not present

## 2024-02-29 DIAGNOSIS — M25471 Effusion, right ankle: Secondary | ICD-10-CM

## 2024-02-29 DIAGNOSIS — R0602 Shortness of breath: Secondary | ICD-10-CM

## 2024-02-29 DIAGNOSIS — Z8709 Personal history of other diseases of the respiratory system: Secondary | ICD-10-CM

## 2024-02-29 DIAGNOSIS — R600 Localized edema: Secondary | ICD-10-CM

## 2024-02-29 DIAGNOSIS — M25475 Effusion, left foot: Secondary | ICD-10-CM

## 2024-02-29 DIAGNOSIS — M79642 Pain in left hand: Secondary | ICD-10-CM

## 2024-02-29 DIAGNOSIS — R5383 Other fatigue: Secondary | ICD-10-CM | POA: Diagnosis not present

## 2024-02-29 DIAGNOSIS — Z79899 Other long term (current) drug therapy: Secondary | ICD-10-CM

## 2024-02-29 DIAGNOSIS — M25472 Effusion, left ankle: Secondary | ICD-10-CM

## 2024-02-29 DIAGNOSIS — I10 Essential (primary) hypertension: Secondary | ICD-10-CM | POA: Diagnosis not present

## 2024-02-29 DIAGNOSIS — M25474 Effusion, right foot: Secondary | ICD-10-CM

## 2024-02-29 MED ORDER — METHOTREXATE SODIUM 2.5 MG PO TABS: ORAL_TABLET | ORAL | 0 refills | Status: DC

## 2024-02-29 NOTE — Patient Instructions (Signed)
 ILD clinic-Dr. Mannam or Dr. Bertrum Brodie

## 2024-03-01 ENCOUNTER — Telehealth: Payer: Self-pay | Admitting: Cardiology

## 2024-03-01 ENCOUNTER — Ambulatory Visit: Payer: Self-pay | Admitting: Physician Assistant

## 2024-03-01 ENCOUNTER — Telehealth (HOSPITAL_BASED_OUTPATIENT_CLINIC_OR_DEPARTMENT_OTHER): Payer: Self-pay

## 2024-03-01 ENCOUNTER — Encounter: Payer: Self-pay | Admitting: Obstetrics and Gynecology

## 2024-03-01 DIAGNOSIS — R899 Unspecified abnormal finding in specimens from other organs, systems and tissues: Secondary | ICD-10-CM

## 2024-03-01 DIAGNOSIS — J449 Chronic obstructive pulmonary disease, unspecified: Secondary | ICD-10-CM

## 2024-03-01 NOTE — Telephone Encounter (Signed)
 On reviewing her chart, she will probably not need an echocardiogram. Hence okay to just see her in the office first.

## 2024-03-01 NOTE — Telephone Encounter (Signed)
 Patient identification verified by 2 forms.   Called and spoke to patient  Patient states:  -She would get an echo and EKG on the same day.  -Pt has several appointments and it makes it easier to schedule things together.              Interventions/Plan: -Pt advised echos are not usually done on the same day as appointments. However I can get a message to the provider about an Echo prior to her 9/2 appt.   Reviewed ED warning signs/precautions  Patient agrees with plan, no questions at this time

## 2024-03-01 NOTE — Progress Notes (Signed)
 CBC stable.  Glucose is 177. Calcium is elevated-10.3---please clarify if she has been taking a calcium or vitamin D  supplement?  ALT is borderline elevated-30-avoid the use of tylenol and alcohol use.   CK remains elevated-447-slightly higher than last time--she is not experiencing symptoms of a flare but recommend rechecking CK in 3 months.   UA does not appear to be a clean catch.  Can urine protein creatinine ratio be added?

## 2024-03-01 NOTE — Telephone Encounter (Signed)
 Patient wants to get orders for an echocardiogram or EKG prior to upcoming visit.

## 2024-03-01 NOTE — Telephone Encounter (Signed)
 Copied from CRM [id number]. Topic: Clinical - Request for Lab/Test Order >> Mar 01, 2024  8:30 AM Margarette Shawl wrote: Reason for CRM:   Pt contacted clinic to schedule FU visit for ILD. Scheduled pt for 08/28 at 4 PM w/ Mannam. Pt reports she typically gets a PFT performed prior to her visit. Reviewed chart and no order has been placed.   Pt requested PFT order be placed if possible and clinic reach out to schedule, so that she can have testing and visit same day. She relies on wife for transportation to appts, and would prefer to have all done in one day, instead of returning to clinic on another day for testing.   CB#  [phone]

## 2024-03-02 NOTE — Telephone Encounter (Signed)
 Left message to call office

## 2024-03-02 NOTE — Telephone Encounter (Signed)
 Pt returned call, made aware and voiced understanding

## 2024-03-04 LAB — MICROSCOPIC MESSAGE

## 2024-03-04 LAB — URINALYSIS, ROUTINE W REFLEX MICROSCOPIC
Bilirubin Urine: NEGATIVE
Glucose, UA: NEGATIVE
Hgb urine dipstick: NEGATIVE
Hyaline Cast: NONE SEEN /LPF
Leukocytes,Ua: NEGATIVE
Nitrite: NEGATIVE
RBC / HPF: NONE SEEN /HPF (ref 0–2)
Specific Gravity, Urine: 1.035 (ref 1.001–1.035)
pH: 5.5 (ref 5.0–8.0)

## 2024-03-04 LAB — PROTEIN / CREATININE RATIO, URINE
Creatinine, Urine: 179 mg/dL (ref 20–275)
Protein/Creat Ratio: 218 mg/g{creat} — ABNORMAL HIGH (ref 24–184)
Protein/Creatinine Ratio: 0.218 mg/mg{creat} — ABNORMAL HIGH (ref 0.024–0.184)
Total Protein, Urine: 39 mg/dL — ABNORMAL HIGH (ref 5–24)

## 2024-03-04 LAB — COMPREHENSIVE METABOLIC PANEL WITH GFR
AG Ratio: 1.2 (calc) (ref 1.0–2.5)
ALT: 30 U/L — ABNORMAL HIGH (ref 6–29)
AST: 32 U/L (ref 10–35)
Albumin: 4.3 g/dL (ref 3.6–5.1)
Alkaline phosphatase (APISO): 61 U/L (ref 31–125)
BUN: 13 mg/dL (ref 7–25)
CO2: 27 mmol/L (ref 20–32)
Calcium: 10.3 mg/dL — ABNORMAL HIGH (ref 8.6–10.2)
Chloride: 100 mmol/L (ref 98–110)
Creat: 0.51 mg/dL (ref 0.50–0.99)
Globulin: 3.6 g/dL (ref 1.9–3.7)
Glucose, Bld: 177 mg/dL — ABNORMAL HIGH (ref 65–99)
Potassium: 4.3 mmol/L (ref 3.5–5.3)
Sodium: 135 mmol/L (ref 135–146)
Total Bilirubin: 0.4 mg/dL (ref 0.2–1.2)
Total Protein: 7.9 g/dL (ref 6.1–8.1)
eGFR: 117 mL/min/{1.73_m2} (ref >=60)

## 2024-03-04 LAB — CBC WITH DIFFERENTIAL/PLATELET
Absolute Lymphocytes: 2380 {cells}/uL (ref 850–3900)
Absolute Monocytes: 1015 {cells}/uL — ABNORMAL HIGH (ref 200–950)
Basophils Absolute: 29 {cells}/uL (ref 0–200)
Basophils Relative: 0.4 %
Eosinophils Absolute: 387 {cells}/uL (ref 15–500)
Eosinophils Relative: 5.3 %
HCT: 38.2 % (ref 35.0–45.0)
Hemoglobin: 13 g/dL (ref 11.7–15.5)
MCH: 33.5 pg — ABNORMAL HIGH (ref 27.0–33.0)
MCHC: 34 g/dL (ref 32.0–36.0)
MCV: 98.5 fL (ref 80.0–100.0)
MPV: 9.9 fL (ref 7.5–12.5)
Monocytes Relative: 13.9 %
Neutro Abs: 3489 {cells}/uL (ref 1500–7800)
Neutrophils Relative %: 47.8 %
Platelets: 289 10*3/uL (ref 140–400)
RBC: 3.88 10*6/uL (ref 3.80–5.10)
RDW: 12.2 % (ref 11.0–15.0)
Total Lymphocyte: 32.6 %
WBC: 7.3 10*3/uL (ref 3.8–10.8)

## 2024-03-04 LAB — TEST AUTHORIZATION

## 2024-03-04 LAB — CK: Total CK: 447 U/L — ABNORMAL HIGH (ref 20–239)

## 2024-03-07 NOTE — Progress Notes (Signed)
 Urine protein creatinine ratio is elevated--Please clarify if the patient has seen a nephrologist in the past?  If not-please place referral

## 2024-03-08 DIAGNOSIS — F4320 Adjustment disorder, unspecified: Secondary | ICD-10-CM | POA: Diagnosis not present

## 2024-03-08 DIAGNOSIS — F4310 Post-traumatic stress disorder, unspecified: Secondary | ICD-10-CM | POA: Diagnosis not present

## 2024-03-13 DIAGNOSIS — F4320 Adjustment disorder, unspecified: Secondary | ICD-10-CM | POA: Diagnosis not present

## 2024-03-13 DIAGNOSIS — F4310 Post-traumatic stress disorder, unspecified: Secondary | ICD-10-CM | POA: Diagnosis not present

## 2024-03-14 ENCOUNTER — Encounter: Payer: Self-pay | Admitting: Nurse Practitioner

## 2024-03-14 ENCOUNTER — Ambulatory Visit (INDEPENDENT_AMBULATORY_CARE_PROVIDER_SITE_OTHER): Payer: BC Managed Care – PPO | Admitting: Nurse Practitioner

## 2024-03-14 VITALS — BP 116/70 | HR 91 | Temp 98.1°F | Ht 68.0 in | Wt 219.0 lb

## 2024-03-14 DIAGNOSIS — R809 Proteinuria, unspecified: Secondary | ICD-10-CM | POA: Diagnosis not present

## 2024-03-14 DIAGNOSIS — R7303 Prediabetes: Secondary | ICD-10-CM

## 2024-03-14 DIAGNOSIS — E66811 Obesity, class 1: Secondary | ICD-10-CM

## 2024-03-14 DIAGNOSIS — Z6833 Body mass index (BMI) 33.0-33.9, adult: Secondary | ICD-10-CM | POA: Diagnosis not present

## 2024-03-14 DIAGNOSIS — I890 Lymphedema, not elsewhere classified: Secondary | ICD-10-CM

## 2024-03-14 DIAGNOSIS — Z23 Encounter for immunization: Secondary | ICD-10-CM | POA: Diagnosis not present

## 2024-03-14 DIAGNOSIS — I10 Essential (primary) hypertension: Secondary | ICD-10-CM | POA: Diagnosis not present

## 2024-03-14 DIAGNOSIS — E786 Lipoprotein deficiency: Secondary | ICD-10-CM | POA: Diagnosis not present

## 2024-03-14 NOTE — Assessment & Plan Note (Signed)
 HgbA1C recheck today, last 6.3.

## 2024-03-14 NOTE — Assessment & Plan Note (Signed)
 Using compression machine daily for one hour

## 2024-03-14 NOTE — Progress Notes (Signed)
 I,Jameka J Llittleton, CMA,acting as a Neurosurgeon for [employer], FNP.,have documented all relevant documentation on the behalf of Susanna Epley, FNP,as directed by  Susanna Epley, FNP while in the presence of Susanna Epley, FNP.  Subjective:  Patient ID: Patricia Krause , female    DOB: [birth year] , 48 y.o.   MRN: [id number]  Chief Complaint  Patient presents with   Prediabetes    Patient presents for a DM and BP check. Patient reports compliance with medications and has no other concerns today.    Hypertension    HPI  Patient presents for predm follow up.  No recent polydipsia, polyphagia, polyuria.    Patient has concerns about protein in urine found by .  Has been referred to nephrology.  Seeing GYN and has follow up for US  for uterine fibroids and cyst found on CT.   Diet has been balanced, she is avoiding sugars and carbs, eats protein when possible. Endorse snacking but is more cautious.  Feels that her weight is up because of lymphedema, not actual weight gain.  Uses leg compression device daily at home for one hour.  Exercises for 10-30 minutes daily using Youtube videos that she can physically perform in between working from home.  Drinks water with all meals, but feels that this could be increased.        Past Medical History:  Diagnosis Date   Asthma    Dermatolysis    Hypertension    Lymphedema    dx by vascular doctor   Pre-diabetes      Family History  Problem Relation Age of Onset   Diabetes Father    Cancer Father        prostate cancer in her 62s.   Cancer Paternal Aunt        uncertain what type of cancer   Diabetes Paternal Grandmother    Alzheimer's disease Paternal Grandfather    High Cholesterol Brother    Diabetes Son    Autism Son    Colon cancer Neg Hx    Breast cancer Neg Hx      Current Outpatient Medications:    albuterol  (VENTOLIN  HFA) 108 (90 Base) MCG/ACT inhaler, Inhale 2 puffs into the lungs every 6 (six) hours as needed for wheezing or  shortness of breath., Disp: 8 g, Rfl: 2   amLODipine  (NORVASC ) 5 MG tablet, Take 1 tablet (5 mg total) by mouth daily., Disp: 90 tablet, Rfl: 1   chlorhexidine (PERIDEX) 0.12 % solution, Use as directed 5 mLs in the mouth or throat daily., Disp: , Rfl:    fluticasone  (FLONASE ) 50 MCG/ACT nasal spray, Place 2 sprays into both nostrils daily., Disp: 18.2 mL, Rfl: 2   folic acid  (FOLVITE ) 1 MG tablet, TAKE 2 TABLETS(2 MG) BY MOUTH DAILY, Disp: 180 tablet, Rfl: 3   hydrocortisone  (ANUSOL -HC) 2.5 % rectal cream, Place 1 Application rectally 2 (two) times daily., Disp: 30 g, Rfl: 1   hydrOXYzine  (ATARAX ) 25 MG tablet, TAKE 1 TABLET BY MOUTH THREE TIMES A DAY AS NEEDED, Disp: 270 tablet, Rfl: 1   methotrexate  (RHEUMATREX) 2.5 MG tablet, TAKE 8 TABLETS BY MOUTH ONE TIME PER WEEK, Disp: 96 tablet, Rfl: 0   mupirocin  ointment (BACTROBAN ) 2 %, Apply 1 Application topically 2 (two) times daily., Disp: 30 g, Rfl: 3   SODIUM FLUORIDE, DENTAL RINSE, 0.2 % SOLN, Take by mouth as directed., Disp: , Rfl:    Vitamin D , Ergocalciferol , (DRISDOL ) 1.25 MG (50000 UNIT) CAPS capsule, TAKE 1  CAPSULE (50,000 UNITS TOTAL) BY MOUTH EVERY 7 (SEVEN) DAYS, Disp: 12 capsule, Rfl: 3   ammonium lactate (AMLACTIN) 12 % cream, Apply topically. (Patient not taking: Reported on 03/14/2024), Disp: , Rfl:    Allergies  Allergen Reactions   Prunus Persica Swelling    Peaches, plums, nectarines.    Cat Dander Hives and Itching   Mixed Ragweed Other (See Comments)   Pollinex-T [Modified Tree Tyrosine Adsorbate] Other (See Comments)     Review of Systems  Constitutional:  Negative for chills, fatigue, fever and unexpected weight change.  HENT:  Negative for congestion.   Respiratory:  Negative for cough, shortness of breath and wheezing.   Cardiovascular:  Negative for chest pain, palpitations and leg swelling.  Gastrointestinal:  Negative for constipation, diarrhea and nausea.  Endocrine: Negative for polydipsia, polyphagia and  polyuria.  Neurological:  Negative for dizziness, weakness and headaches.     Today's Vitals   03/14/24 1508  BP: 116/70  Pulse: 91  Temp: 98.1 F (36.7 C)  TempSrc: Oral  Weight: 219 lb (99.3 kg)  Height: 5\' 8"  (1.727 m)   Body mass index is 33.3 kg/m.  Wt Readings from Last 3 Encounters:  03/14/24 219 lb (99.3 kg)  02/29/24 215 lb (97.5 kg)  01/11/24 210 lb (95.3 kg)      Objective:  Physical Exam Constitutional:      Appearance: Normal appearance.  HENT:     Head: Normocephalic and atraumatic.     Right Ear: External ear normal.     Left Ear: External ear normal.     Nose: Nose normal.  Eyes:     Pupils: Pupils are equal, round, and reactive to light.  Neck:     Vascular: No carotid bruit.  Cardiovascular:     Rate and Rhythm: Normal rate and regular rhythm.     Pulses: Normal pulses.     Heart sounds: Normal heart sounds.  Pulmonary:     Effort: Pulmonary effort is normal.     Breath sounds: Normal breath sounds.  Musculoskeletal:     Right lower leg: Edema (lymphedema) present.     Left lower leg: Edema (lymphedema) present.  Skin:    General: Skin is warm and dry.     Capillary Refill: Capillary refill takes less than 2 seconds.  Neurological:     General: No focal deficit present.     Mental Status: She is alert and oriented to person, place, and time.  Psychiatric:        Mood and Affect: Mood normal.        Behavior: Behavior normal.        Thought Content: Thought content normal.        Judgment: Judgment normal.         Assessment And Plan:  Prediabetes Assessment & Plan: HgbA1C recheck today, last 6.3.    Orders: -     Hemoglobin A1c  Essential hypertension Assessment & Plan: Blood pressure is controlled, continue current medications   Low HDL (under 40) Assessment & Plan: Will check lipid panel. Continue low fat diet  Orders: -     Lipid panel  Need for pneumococcal 20-valent conjugate vaccination Assessment & Plan: Given  in office  Orders: -     Pneumococcal conjugate vaccine 20-valent  Class 1 obesity with body mass index (BMI) of 33.0 to 33.9 in adult, unspecified obesity type, unspecified whether serious comorbidity present Assessment & Plan: Encouraged to continue exercise regularly as tolerated.  Proteinuria, unspecified type Assessment & Plan: Will check parathyroid level due to hyperproteinuria  Orders: -     PTH, intact and calcium -     Protein electrophoresis, serum  Lymphedema Assessment & Plan: Using compression machine daily for one hour     Return for phys in December.  Patient was given opportunity to ask questions. Patient verbalized understanding of the plan and was able to repeat key elements of the plan. All questions were answered to their satisfaction.   I have reviewed this encounter including the documentation in this note and/or discussed this patient with Mickael Alamo FNP Student. I am certifying that I agree with the content of this note as the primary care nurse practitioner.  Susanna Epley, DNP, FNP-BC  I, Susanna Epley, FNP, have reviewed all documentation for this visit. The documentation on 03/14/24 for the exam, diagnosis, procedures, and orders are all accurate and complete.    IF YOU HAVE BEEN REFERRED TO A SPECIALIST, IT MAY TAKE 1-2 WEEKS TO SCHEDULE/PROCESS THE REFERRAL. IF YOU HAVE NOT HEARD FROM US /SPECIALIST IN TWO WEEKS, PLEASE GIVE US  A CALL AT [phone].

## 2024-03-15 LAB — PROTEIN ELECTROPHORESIS, SERUM
A/G Ratio: 0.9 (ref 0.7–1.7)
Albumin ELP: 3.5 g/dL (ref 2.9–4.4)
Alpha 1: 0.2 g/dL (ref 0.0–0.4)
Alpha 2: 0.7 g/dL (ref 0.4–1.0)
Beta: 1.2 g/dL (ref 0.7–1.3)
Gamma Globulin: 2 g/dL — ABNORMAL HIGH (ref 0.4–1.8)
Globulin, Total: 4.1 g/dL — ABNORMAL HIGH (ref 2.2–3.9)
Total Protein: 7.6 g/dL (ref 6.0–8.5)

## 2024-03-15 LAB — LIPID PANEL
Chol/HDL Ratio: 4.4 ratio (ref 0.0–4.4)
Cholesterol, Total: 155 mg/dL (ref 100–199)
HDL: 35 mg/dL — ABNORMAL LOW (ref >39)
LDL Chol Calc (NIH): 84 mg/dL (ref 0–99)
Triglycerides: 212 mg/dL — ABNORMAL HIGH (ref 0–149)
VLDL Cholesterol Cal: 36 mg/dL (ref 5–40)

## 2024-03-15 LAB — HEMOGLOBIN A1C
Est. average glucose Bld gHb Est-mCnc: 163 mg/dL
Hgb A1c MFr Bld: 7.3 % — ABNORMAL HIGH (ref 4.8–5.6)

## 2024-03-15 LAB — PTH, INTACT AND CALCIUM
Calcium: 9.8 mg/dL (ref 8.7–10.2)
PTH: 16 pg/mL (ref 15–65)

## 2024-03-18 ENCOUNTER — Other Ambulatory Visit: Payer: Self-pay | Admitting: Nurse Practitioner

## 2024-03-19 ENCOUNTER — Ambulatory Visit: Payer: Self-pay | Admitting: Nurse Practitioner

## 2024-03-19 DIAGNOSIS — Z23 Encounter for immunization: Secondary | ICD-10-CM | POA: Insufficient documentation

## 2024-03-19 DIAGNOSIS — E66811 Obesity, class 1: Secondary | ICD-10-CM | POA: Insufficient documentation

## 2024-03-19 DIAGNOSIS — R809 Proteinuria, unspecified: Secondary | ICD-10-CM | POA: Insufficient documentation

## 2024-03-19 NOTE — Assessment & Plan Note (Signed)
Given in office

## 2024-03-19 NOTE — Assessment & Plan Note (Signed)
 Blood pressure is controlled, continue current medications

## 2024-03-19 NOTE — Assessment & Plan Note (Signed)
 Encouraged to continue exercise regularly as tolerated.

## 2024-03-19 NOTE — Assessment & Plan Note (Signed)
 Will check parathyroid level due to hyperproteinuria

## 2024-03-19 NOTE — Assessment & Plan Note (Signed)
 Will check lipid panel. Continue low fat diet

## 2024-03-21 ENCOUNTER — Ambulatory Visit

## 2024-03-21 ENCOUNTER — Encounter: Payer: Self-pay | Admitting: Obstetrics and Gynecology

## 2024-03-21 ENCOUNTER — Ambulatory Visit (INDEPENDENT_AMBULATORY_CARE_PROVIDER_SITE_OTHER): Admitting: Obstetrics and Gynecology

## 2024-03-21 VITALS — BP 124/80 | HR 88

## 2024-03-21 DIAGNOSIS — D219 Benign neoplasm of connective and other soft tissue, unspecified: Secondary | ICD-10-CM

## 2024-03-21 DIAGNOSIS — Z8742 Personal history of other diseases of the female genital tract: Secondary | ICD-10-CM

## 2024-03-21 DIAGNOSIS — N83201 Unspecified ovarian cyst, right side: Secondary | ICD-10-CM

## 2024-03-21 NOTE — Progress Notes (Signed)
 GYNECOLOGY  VISIT   HPI: 48 y.o.   Married  [place] female   G1P1 with No LMP recorded. (Menstrual status: IUD).   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    Also has fibroids.   Has Mirena IUD.  Pelvic US  02/08/24: Pelvic US  Uterus 10.96 x 5.42 x 5.58 cm.  Multiple fibroids - subserosal and intramural:  1.76 cm, 2.09 cm, 1.52 cm, 2.73 cm, 1.26 cm.  EMS 3.61 mm.  IUD located in normal position in endometrial canal.  Left ovary 3.56 x 2.55 x 3.04 cm.  Follicles noted.  Right ovary 4.91 x 4.52 x 3.90 cm.   37 x 29 mm simple avascular cyst.  21 x 16 mm complex cyst.  Follicle also noted.  No adnexal masses.  Small amount of cul de sac fluid.   GYNECOLOGIC HISTORY: No LMP recorded. (Menstrual status: IUD). Contraception:  IUD  Menopausal hormone therapy:  n/a Last 2 paps:  08/20/22 neg HR HPV neg History of abnormal Pap or positive HPV:  no Mammogram:  02/18/23 Breast Density Cat C, BIRADS Cat 1 neg        OB History     Gravida  1   Para  1   Term      Preterm      AB      Living  1      SAB      IAB      Ectopic      Multiple      Live Births                 Patient Active Problem List   Diagnosis Date Noted   Need for pneumococcal 20-valent conjugate vaccination 03/19/2024   Class 1 obesity with body mass index (BMI) of 33.0 to 33.9 in adult 03/19/2024   Proteinuria 03/19/2024   Encounter for general health examination 09/14/2023   Scleroderma (HCC) 06/15/2023   Mild intermittent asthma without complication 06/15/2023   Low HDL (under 40) 02/25/2023   Lymphedema 02/25/2023   Class 1 obesity due to excess calories without serious comorbidity with body mass index (BMI) of 31.0 to 31.9 in adult 02/25/2023   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   Raynaud's syndrome without gangrene 12/02/2021   History of asthma 11/06/2021   Vitamin D  deficiency  11/06/2021   Prediabetes 11/06/2021   Essential hypertension 11/06/2021   Scarring alopecia 08/18/2016   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 8 g 2   amLODipine  (NORVASC ) 5 MG tablet Take 1 tablet (5 mg total) by mouth daily. 90 tablet 1   fluticasone  (FLONASE ) 50 MCG/ACT nasal spray Place 2 sprays into both nostrils daily. 18.2 mL 2   folic acid  (FOLVITE ) 1 MG tablet TAKE 2 TABLETS(2 MG) BY MOUTH  DAILY 180 tablet 3   hydrOXYzine  (ATARAX ) 25 MG tablet TAKE 1 TABLET BY MOUTH THREE TIMES A DAY AS NEEDED 270 tablet 1   methotrexate  (RHEUMATREX) 2.5 MG tablet TAKE 8 TABLETS BY MOUTH ONE TIME PER WEEK 96 tablet 0   mupirocin  ointment (BACTROBAN ) 2 % Apply 1 Application topically 2 (two) times daily. 30 g 3   SODIUM FLUORIDE, DENTAL RINSE, 0.2 % SOLN Take by mouth as directed.     Vitamin D , Ergocalciferol , (DRISDOL ) 1.25 MG (50000 UNIT) CAPS capsule TAKE 1 CAPSULE (50,000 UNITS TOTAL) BY MOUTH EVERY 7 (SEVEN) DAYS 12 capsule 3   ammonium lactate (AMLACTIN) 12 % cream Apply topically. (Patient not taking: Reported on 03/21/2024)     chlorhexidine (PERIDEX) 0.12 % solution Use as directed 5 mLs in the mouth or throat daily. (Patient not taking: Reported on 03/21/2024)     hydrocortisone  (ANUSOL -HC) 2.5 % rectal cream Place 1 Application rectally 2 (two) times daily. (Patient not taking: Reported on 03/21/2024) 30 g 1   No current facility-administered medications for this visit.      ALLERGIES: Prunus persica, Cat dander, Mixed ragweed, and Pollinex-t [modified tree tyrosine adsorbate]  Family History  Problem Relation Age of Onset   Diabetes Father    Cancer Father        prostate cancer in her 86s.   Cancer Paternal Aunt        uncertain what type of cancer   Diabetes Paternal Grandmother    Alzheimer's disease Paternal Grandfather    High Cholesterol Brother    Diabetes Son    Autism Son    Colon cancer Neg Hx    Breast cancer Neg Hx     Social History   Socioeconomic History   Marital status: Married    Spouse name: Not on file   Number of children: 1   Years of education: Not on file   Highest education level: Bachelor's degree (e.g., BA, AB, BS)  Occupational History   Not on file  Tobacco Use   Smoking status: Never    Passive exposure: Never   Smokeless tobacco: Never  Vaping Use   Vaping status: Never Used  Substance and Sexual Activity   Alcohol use: Yes    Comment: 1-2 drinks/months   Drug use: Never   Sexual activity: Yes    Birth control/protection: I.U.D.    Comment: Mirena (06/01/2018)  Other Topics Concern   Not on file  Social History Narrative   Not on file   Social Drivers of Health   Financial Resource Strain: Low Risk  (03/10/2024)   Overall Financial Resource Strain (CARDIA)    Difficulty of Paying Living Expenses: Not very hard  Food Insecurity: No Food Insecurity (03/10/2024)   Hunger Vital Sign    Worried About Running Out of Food in the Last Year: Never true    Ran Out of Food in the Last Year: Never true  Transportation Needs: No Transportation Needs (03/10/2024)   PRAPARE - Administrator, Civil Service (Medical): No    Lack of Transportation (Non-Medical): No  Physical Activity: Insufficiently Active (03/10/2024)   Exercise Vital Sign    Days of Exercise per Week: 5 days    Minutes of Exercise per Session: 10 min  Stress: Stress Concern Present (03/10/2024)   [employer] of Occupational  Health - Occupational Stress Questionnaire    Feeling of Stress : To some extent  Social Connections: Moderately Integrated (03/10/2024)   Social Connection  and Isolation Panel [NHANES]    Frequency of Communication with Friends and Family: Once a week    Frequency of Social Gatherings with Friends and Family: Once a week    Attends Religious Services: More than 4 times per year    Active Member of [employer] or Organizations: Yes    Attends Engineer, structural: More than 4 times per year    Marital Status: Married  Catering manager Violence: Not At Risk (06/14/2018)   [facility], [facility]   Humiliation, Afraid, Rape, and Kick questionnaire    Fear of Current or Ex-Partner: No    Emotionally Abused: No    Physically Abused: No    Sexually Abused: No    Review of Systems  All other systems reviewed and are negative.   PHYSICAL EXAMINATION:   BP 124/80 (BP Location: Left Arm, Patient Position: Sitting)   Pulse 88   SpO2 96%     General appearance: alert, cooperative and appears stated age  Pelvic US  Uterus 9.36 x 7.10 cm x 4.86 cm.  Multiple fibroids noted.  IUD in normal position in endometrial canal.  Left ovary 4.90 x 2.63 x 3.07 cm.  3.2 x 2.7 cm simple cyst.  Right ovary 3.12 x 2.24 x 2.04 cm.  Follicles present.  Ovarian cysts have resolved.  No adnexal masses.  No free fluid.   ASSESSMENT:  Complex right ovarian cyst, resolved. Simple right ovarian cyst, resolved.  Simple left ovarian cyst present.  Fibroid uterus.  At increased risk of ovarian cancer due to dermatomyositis.  IUD in normal position.   PLAN:  Pelvic images and report reviewed.  Reassurance regarding appearance of the ovaries. Plan for annual exam in April 2026.  Will do routine annual pelvic US .  Call for pelvic pressure, abnormal bleeding, or increase in girth of abdomen.   19 min  total time was spent for this patient encounter, including preparation,  face-to-face counseling with the patient, coordination of care, and documentation of the encounter.

## 2024-03-28 ENCOUNTER — Telehealth: Payer: Self-pay

## 2024-03-28 ENCOUNTER — Other Ambulatory Visit: Payer: Self-pay

## 2024-03-28 DIAGNOSIS — E119 Type 2 diabetes mellitus without complications: Secondary | ICD-10-CM

## 2024-03-28 MED ORDER — RYBELSUS 3 MG PO TABS: 1.0000 | ORAL_TABLET | Freq: Every day | ORAL | 2 refills | Status: DC

## 2024-03-28 NOTE — Telephone Encounter (Signed)
Rx benefits verified YL,RMA

## 2024-04-03 ENCOUNTER — Telehealth: Payer: Self-pay

## 2024-04-03 ENCOUNTER — Other Ambulatory Visit: Payer: Self-pay

## 2024-04-03 MED ORDER — EMPAGLIFLOZIN 10 MG PO TABS: 10.0000 mg | ORAL_TABLET | Freq: Every day | ORAL | 2 refills | Status: DC

## 2024-04-03 NOTE — Telephone Encounter (Signed)
Rx benefits verified YL,RMA

## 2024-04-04 DIAGNOSIS — F4310 Post-traumatic stress disorder, unspecified: Secondary | ICD-10-CM | POA: Diagnosis not present

## 2024-04-04 DIAGNOSIS — F4320 Adjustment disorder, unspecified: Secondary | ICD-10-CM | POA: Diagnosis not present

## 2024-04-10 DIAGNOSIS — F4320 Adjustment disorder, unspecified: Secondary | ICD-10-CM | POA: Diagnosis not present

## 2024-04-10 DIAGNOSIS — F4310 Post-traumatic stress disorder, unspecified: Secondary | ICD-10-CM | POA: Diagnosis not present

## 2024-04-19 DIAGNOSIS — F4320 Adjustment disorder, unspecified: Secondary | ICD-10-CM | POA: Diagnosis not present

## 2024-04-19 DIAGNOSIS — F4310 Post-traumatic stress disorder, unspecified: Secondary | ICD-10-CM | POA: Diagnosis not present

## 2024-04-26 ENCOUNTER — Other Ambulatory Visit: Payer: Self-pay | Admitting: Nurse Practitioner

## 2024-04-26 DIAGNOSIS — F4310 Post-traumatic stress disorder, unspecified: Secondary | ICD-10-CM | POA: Diagnosis not present

## 2024-04-26 DIAGNOSIS — F4320 Adjustment disorder, unspecified: Secondary | ICD-10-CM | POA: Diagnosis not present

## 2024-05-03 DIAGNOSIS — F4310 Post-traumatic stress disorder, unspecified: Secondary | ICD-10-CM | POA: Diagnosis not present

## 2024-05-03 DIAGNOSIS — F4320 Adjustment disorder, unspecified: Secondary | ICD-10-CM | POA: Diagnosis not present

## 2024-05-09 DIAGNOSIS — R809 Proteinuria, unspecified: Secondary | ICD-10-CM | POA: Diagnosis not present

## 2024-05-09 DIAGNOSIS — M3313 Other dermatomyositis without myopathy: Secondary | ICD-10-CM | POA: Diagnosis not present

## 2024-05-09 DIAGNOSIS — I10 Essential (primary) hypertension: Secondary | ICD-10-CM | POA: Diagnosis not present

## 2024-05-09 DIAGNOSIS — E559 Vitamin D deficiency, unspecified: Secondary | ICD-10-CM | POA: Diagnosis not present

## 2024-05-09 LAB — LAB REPORT - SCANNED: EGFR: 116

## 2024-05-11 DIAGNOSIS — F4310 Post-traumatic stress disorder, unspecified: Secondary | ICD-10-CM | POA: Diagnosis not present

## 2024-05-11 DIAGNOSIS — F4320 Adjustment disorder, unspecified: Secondary | ICD-10-CM | POA: Diagnosis not present

## 2024-05-15 ENCOUNTER — Other Ambulatory Visit: Payer: Self-pay | Admitting: Nephrology

## 2024-05-15 ENCOUNTER — Encounter: Payer: Self-pay | Admitting: Nephrology

## 2024-05-15 DIAGNOSIS — R809 Proteinuria, unspecified: Secondary | ICD-10-CM

## 2024-05-15 DIAGNOSIS — M3310 Other dermatopolymyositis, organ involvement unspecified: Secondary | ICD-10-CM

## 2024-05-15 NOTE — Telephone Encounter (Signed)
 Recommend returning to have CK rechecked.    Also recommend evaluation by a spine specialist

## 2024-05-15 NOTE — Telephone Encounter (Signed)
 CK will assess for a myositis flare.    If the pain is radiating from her back she will need to see a spine specialist for further evaluation.

## 2024-05-16 ENCOUNTER — Inpatient Hospital Stay
Admission: RE | Admit: 2024-05-16 | Discharge: 2024-05-16 | Discharge status: Home / Self Care | Source: Ambulatory Visit | Attending: Nephrology | Admitting: Nephrology

## 2024-05-16 ENCOUNTER — Telehealth: Payer: Self-pay | Admitting: *Deleted

## 2024-05-16 ENCOUNTER — Other Ambulatory Visit: Payer: Self-pay | Admitting: *Deleted

## 2024-05-16 DIAGNOSIS — M3310 Other dermatopolymyositis, organ involvement unspecified: Secondary | ICD-10-CM

## 2024-05-16 DIAGNOSIS — R809 Proteinuria, unspecified: Secondary | ICD-10-CM | POA: Diagnosis not present

## 2024-05-16 DIAGNOSIS — M545 Low back pain, unspecified: Secondary | ICD-10-CM

## 2024-05-16 NOTE — Telephone Encounter (Signed)
Referral placed per Taylor Dale, PA-C 

## 2024-05-17 ENCOUNTER — Ambulatory Visit: Payer: Self-pay | Admitting: Physician Assistant

## 2024-05-17 DIAGNOSIS — F4310 Post-traumatic stress disorder, unspecified: Secondary | ICD-10-CM | POA: Diagnosis not present

## 2024-05-17 DIAGNOSIS — F4320 Adjustment disorder, unspecified: Secondary | ICD-10-CM | POA: Diagnosis not present

## 2024-05-17 LAB — CK: Total CK: 628 U/L — ABNORMAL HIGH (ref 20–239)

## 2024-05-17 NOTE — Progress Notes (Signed)
 CK is slightly more elevated than her baseline--628. Reviewed with Dr. Weber rechecking CK in 2-3 weeks.   Recommend evaluation by spine specialist still.

## 2024-05-21 ENCOUNTER — Other Ambulatory Visit: Payer: Self-pay | Admitting: Nurse Practitioner

## 2024-05-23 ENCOUNTER — Ambulatory Visit
Admission: RE | Admit: 2024-05-23 | Discharge: 2024-05-23 | Disposition: A | Discharge status: Home / Self Care | Source: Ambulatory Visit | Attending: Obstetrics and Gynecology | Admitting: Obstetrics and Gynecology

## 2024-05-23 DIAGNOSIS — Z1231 Encounter for screening mammogram for malignant neoplasm of breast: Secondary | ICD-10-CM | POA: Diagnosis not present

## 2024-05-24 DIAGNOSIS — F4310 Post-traumatic stress disorder, unspecified: Secondary | ICD-10-CM | POA: Diagnosis not present

## 2024-05-24 DIAGNOSIS — F4320 Adjustment disorder, unspecified: Secondary | ICD-10-CM | POA: Diagnosis not present

## 2024-05-25 ENCOUNTER — Ambulatory Visit: Payer: Self-pay | Admitting: Obstetrics and Gynecology

## 2024-05-28 NOTE — Telephone Encounter (Signed)
 Can we get PFTs on the same day as her clinic visit this month?

## 2024-05-29 ENCOUNTER — Encounter: Payer: Self-pay | Admitting: Nurse Practitioner

## 2024-05-29 ENCOUNTER — Encounter: Payer: Self-pay | Admitting: Obstetrics and Gynecology

## 2024-05-29 ENCOUNTER — Telehealth (INDEPENDENT_AMBULATORY_CARE_PROVIDER_SITE_OTHER): Admitting: Nurse Practitioner

## 2024-05-29 DIAGNOSIS — R771 Abnormality of globulin: Secondary | ICD-10-CM | POA: Diagnosis not present

## 2024-05-29 DIAGNOSIS — R21 Rash and other nonspecific skin eruption: Secondary | ICD-10-CM

## 2024-05-29 DIAGNOSIS — M3310 Other dermatopolymyositis, organ involvement unspecified: Secondary | ICD-10-CM

## 2024-05-29 MED ORDER — FLUCONAZOLE 150 MG PO TABS: ORAL_TABLET | ORAL | 0 refills | Status: DC

## 2024-05-29 MED ORDER — NYSTATIN 100000 UNIT/GM EX CREA: 1.0000 | TOPICAL_CREAM | Freq: Two times a day (BID) | CUTANEOUS | 1 refills | Status: DC

## 2024-05-29 NOTE — Telephone Encounter (Signed)
 There is no opening soonest there is  is the 27th at 4:00 will that be okay

## 2024-05-29 NOTE — Telephone Encounter (Signed)
Patient's appointment has been canceled

## 2024-05-29 NOTE — Progress Notes (Addendum)
 Virtual Visit via Video Note  Patricia Krause, CMA,acting as a scribe for Patricia Ada, FNP.,have documented all relevant documentation on the behalf of Patricia Ada, FNP,as directed by  Patricia Ada, FNP while in the presence of Patricia Ada, FNP.  I connected with Patricia Krause on 05/29/2024 at  2:20 PM EDT by a video enabled telemedicine application and verified that I am speaking with the correct person using two identifiers.  Patient Location: Home Provider Location: Office/Clinic  I discussed the limitations, risks, security, and privacy concerns of performing an evaluation and management service by video and the availability of in person appointments. I also discussed with the patient that there may be a patient responsible charge related to this service. The patient expressed understanding and agreed to proceed.  Subjective: PCP: Krause Gaines, FNP  Chief Complaint  Patient presents with   Rash    Patient presents today for a rash on her inner thighs. Patient reports she it usually comes and goes but this time never gotten better. Patient reports she has used OTC medications with no relief. She reports when she pees it does burn her thigh. She reports it is a little itchy.    She has been having a rash to the inner thighs. She admits to not drinking enough      ROS: Per HPI  Current Outpatient Medications:    albuterol  (VENTOLIN  HFA) 108 (90 Base) MCG/ACT inhaler, Inhale 2 puffs into the lungs every 6 (six) hours as needed for wheezing or shortness of breath., Disp: 8 g, Rfl: 2   amLODipine  (NORVASC ) 5 MG tablet, TAKE 1 TABLET (5 MG TOTAL) BY MOUTH DAILY., Disp: 90 tablet, Rfl: 1   fluconazole  (DIFLUCAN ) 150 MG tablet, Take one tablet by mouth at the onset and repeat in 5 days. (Patient not taking: Reported on 06/08/2024), Disp: 2 tablet, Rfl: 0   fluticasone  (FLONASE ) 50 MCG/ACT nasal spray, Place 2 sprays into both nostrils daily. (Patient not taking: Reported on  06/08/2024), Disp: 18.2 mL, Rfl: 2   folic acid  (FOLVITE ) 1 MG tablet, TAKE 2 TABLETS(2 MG) BY MOUTH DAILY, Disp: 180 tablet, Rfl: 3   hydrOXYzine  (ATARAX ) 25 MG tablet, TAKE 1 TABLET BY MOUTH THREE TIMES A DAY AS NEEDED, Disp: 270 tablet, Rfl: 1   JARDIANCE  10 MG TABS tablet, TAKE 1 TABLET BY MOUTH DAILY BEFORE BREAKFAST., Disp: 90 tablet, Rfl: 1   methotrexate  (RHEUMATREX) 2.5 MG tablet, TAKE 8 TABLETS BY MOUTH ONE TIME PER WEEK, Disp: 96 tablet, Rfl: 0   mupirocin  ointment (BACTROBAN ) 2 %, Apply 1 Application topically 2 (two) times daily. (Patient taking differently: Apply 1 Application topically 2 (two) times daily. PRN), Disp: 30 g, Rfl: 3   nystatin  cream (MYCOSTATIN ), Apply 1 Application topically 2 (two) times daily., Disp: 30 g, Rfl: 1   SODIUM FLUORIDE, DENTAL RINSE, 0.2 % SOLN, Take by mouth as directed., Disp: , Rfl:    Vitamin D , Ergocalciferol , (DRISDOL ) 1.25 MG (50000 UNIT) CAPS capsule, TAKE 1 CAPSULE (50,000 UNITS TOTAL) BY MOUTH EVERY 7 (SEVEN) DAYS, Disp: 12 capsule, Rfl: 3   ammonium lactate (AMLACTIN) 12 % cream, Apply topically. (Patient not taking: Reported on 06/08/2024), Disp: , Rfl:    chlorhexidine (PERIDEX) 0.12 % solution, Use as directed 5 mLs in the mouth or throat daily. (Patient not taking: Reported on 06/08/2024), Disp: , Rfl:    hydrocortisone  (ANUSOL -HC) 2.5 % rectal cream, Place 1 Application rectally 2 (two) times daily. (Patient not taking: Reported on 06/08/2024),  Disp: 30 g, Rfl: 1  Observations/Objective: There were no vitals filed for this visit. Physical Exam Vitals and nursing note reviewed.  Constitutional:      Appearance: Normal appearance.  HENT:     Head: Normocephalic and atraumatic.     Right Ear: External ear normal.     Left Ear: External ear normal.     Nose: Nose normal.  Eyes:     Pupils: Pupils are equal, round, and reactive to light.  Neck:     Vascular: No carotid bruit.  Cardiovascular:     Rate and Rhythm: Normal rate and regular  rhythm.     Pulses: Normal pulses.     Heart sounds: Normal heart sounds.  Pulmonary:     Effort: Pulmonary effort is normal.     Breath sounds: Normal breath sounds.  Musculoskeletal:     Right lower leg: No edema.     Left lower leg: No edema.  Skin:    General: Skin is warm and dry.     Capillary Refill: Capillary refill takes less than 2 seconds.  Neurological:     General: No focal deficit present.     Mental Status: She is alert and oriented to person, place, and time.  Psychiatric:        Mood and Affect: Mood normal.        Behavior: Behavior normal.        Thought Content: Thought content normal.        Judgment: Judgment normal.      Assessment and Plan: Rash Assessment & Plan: She has erythematous and dry areas to her groin bilaterally. Appears excoriated.   Orders: -     Nystatin ; Apply 1 Application topically 2 (two) times daily.  Dispense: 30 g; Refill: 1 -     Fluconazole ; Take one tablet by mouth at the onset and repeat in 5 days. (Patient not taking: Reported on 06/08/2024)  Dispense: 2 tablet; Refill: 0  Abnormal gamma globulin level Assessment & Plan: Gamma globulin was elevated in June. Will refer to oncology for further evaluation  Orders: -     Ambulatory referral to Hematology / Oncology  Adult onset dermatomyositis [facility]) -     Ambulatory referral to Hematology / Oncology    Follow Up Instructions: No follow-ups on file.   I discussed the assessment and treatment plan with the patient. The patient was provided an opportunity to ask questions, and all were answered. The patient agreed with the plan and demonstrated an understanding of the instructions.   The patient was advised to call back or seek an in-person evaluation if the symptoms worsen or if the condition fails to improve as anticipated.  The above assessment and management plan was discussed with the patient. The patient verbalized understanding of and has agreed to the management  plan.   Patricia Patricia Ada, FNP, have reviewed all documentation for this visit. The documentation on 05/29/24 for the exam, diagnosis, procedures, and orders are all accurate and complete.

## 2024-05-30 ENCOUNTER — Other Ambulatory Visit: Payer: Self-pay | Admitting: *Deleted

## 2024-05-30 DIAGNOSIS — M3310 Other dermatopolymyositis, organ involvement unspecified: Secondary | ICD-10-CM | POA: Diagnosis not present

## 2024-05-30 DIAGNOSIS — M79606 Pain in leg, unspecified: Secondary | ICD-10-CM | POA: Diagnosis not present

## 2024-05-30 DIAGNOSIS — Z6832 Body mass index (BMI) 32.0-32.9, adult: Secondary | ICD-10-CM | POA: Diagnosis not present

## 2024-05-30 NOTE — Addendum Note (Signed)
 Addended byBETHA FRIES, Sajad Glander A on: 05/30/2024 04:20 PM   Modules accepted: Orders

## 2024-05-30 NOTE — Telephone Encounter (Signed)
 Yes.  PFTs on that day are fine.  Please schedule

## 2024-05-30 NOTE — Telephone Encounter (Signed)
 Pt pft has been scheduled 8/26 per patient preference. Nfn  Order has been placed.

## 2024-05-31 ENCOUNTER — Ambulatory Visit: Payer: Self-pay | Admitting: Rheumatology

## 2024-05-31 ENCOUNTER — Ambulatory Visit: Payer: Self-pay | Admitting: Nurse Practitioner

## 2024-05-31 DIAGNOSIS — F4310 Post-traumatic stress disorder, unspecified: Secondary | ICD-10-CM | POA: Diagnosis not present

## 2024-05-31 DIAGNOSIS — F4320 Adjustment disorder, unspecified: Secondary | ICD-10-CM | POA: Diagnosis not present

## 2024-05-31 LAB — CK: Total CK: 726 U/L — ABNORMAL HIGH (ref 20–239)

## 2024-05-31 NOTE — Progress Notes (Signed)
 CK remains elevated.  If patient is asymptomatic we will see her at the follow-up visit next week.  If she is having any symptoms please schedule an earlier appointment.

## 2024-06-01 ENCOUNTER — Ambulatory Visit: Admitting: Physician Assistant

## 2024-06-05 ENCOUNTER — Ambulatory Visit: Admitting: Obstetrics and Gynecology

## 2024-06-06 ENCOUNTER — Ambulatory Visit (INDEPENDENT_AMBULATORY_CARE_PROVIDER_SITE_OTHER): Admitting: Pulmonary Disease

## 2024-06-06 DIAGNOSIS — J449 Chronic obstructive pulmonary disease, unspecified: Secondary | ICD-10-CM | POA: Diagnosis not present

## 2024-06-06 LAB — PULMONARY FUNCTION TEST
DL/VA % pred: 118 %
DL/VA: 5.01 ml/min/mmHg/L
DLCO unc % pred: 105 %
DLCO unc: 25.81 ml/min/mmHg
FEF 25-75 Post: 2.18 L/s
FEF 25-75 Pre: 1.42 L/s
FEF2575-%Change-Post: 53 %
FEF2575-%Pred-Post: 69 %
FEF2575-%Pred-Pre: 45 %
FEV1-%Change-Post: 11 %
FEV1-%Pred-Post: 80 %
FEV1-%Pred-Pre: 72 %
FEV1-Post: 2.66 L
FEV1-Pre: 2.38 L
FEV1FVC-%Change-Post: 11 %
FEV1FVC-%Pred-Pre: 84 %
FEV6-%Change-Post: 0 %
FEV6-%Pred-Post: 85 %
FEV6-%Pred-Pre: 85 %
FEV6-Post: 3.45 L
FEV6-Pre: 3.42 L
FEV6FVC-%Change-Post: 1 %
FEV6FVC-%Pred-Post: 102 %
FEV6FVC-%Pred-Pre: 100 %
FVC-%Change-Post: 0 %
FVC-%Pred-Post: 84 %
FVC-%Pred-Pre: 84 %
FVC-Post: 3.46 L
FVC-Pre: 3.48 L
Post FEV1/FVC ratio: 77 %
Post FEV6/FVC ratio: 100 %
Pre FEV1/FVC ratio: 69 %
Pre FEV6/FVC Ratio: 98 %
RV % pred: 126 %
RV: 2.41 L
TLC % pred: 107 %
TLC: 6.08 L

## 2024-06-06 NOTE — Progress Notes (Signed)
 Full pft performed today

## 2024-06-06 NOTE — Patient Instructions (Signed)
 Full pft performed today

## 2024-06-08 ENCOUNTER — Ambulatory Visit: Admitting: Pulmonary Disease

## 2024-06-08 ENCOUNTER — Encounter: Payer: Self-pay | Admitting: Interventional Cardiology

## 2024-06-08 ENCOUNTER — Encounter: Payer: Self-pay | Admitting: Pulmonary Disease

## 2024-06-08 ENCOUNTER — Encounter: Payer: Self-pay | Admitting: Cardiology

## 2024-06-08 ENCOUNTER — Encounter: Payer: Self-pay | Admitting: Nurse Practitioner

## 2024-06-08 VITALS — BP 122/84 | HR 86 | Temp 99.1°F | Ht 68.0 in | Wt 212.0 lb

## 2024-06-08 DIAGNOSIS — J849 Interstitial pulmonary disease, unspecified: Secondary | ICD-10-CM

## 2024-06-08 DIAGNOSIS — M3310 Other dermatopolymyositis, organ involvement unspecified: Secondary | ICD-10-CM | POA: Diagnosis not present

## 2024-06-08 DIAGNOSIS — J4520 Mild intermittent asthma, uncomplicated: Secondary | ICD-10-CM | POA: Diagnosis not present

## 2024-06-08 DIAGNOSIS — M349 Systemic sclerosis, unspecified: Secondary | ICD-10-CM | POA: Diagnosis not present

## 2024-06-08 DIAGNOSIS — R21 Rash and other nonspecific skin eruption: Secondary | ICD-10-CM | POA: Insufficient documentation

## 2024-06-08 DIAGNOSIS — R771 Abnormality of globulin: Secondary | ICD-10-CM | POA: Insufficient documentation

## 2024-06-08 NOTE — Assessment & Plan Note (Signed)
 Gamma globulin was elevated in June. Will refer to oncology for further evaluation

## 2024-06-08 NOTE — Assessment & Plan Note (Signed)
 She has erythematous and dry areas to her groin bilaterally. Appears excoriated.

## 2024-06-08 NOTE — Progress Notes (Addendum)
 Patricia Krause    [id number]    [birth year]  Primary Care Physician:Moore, Gaines, FNP  Referring Physician: Georgina Gaines, FNP [address] [place],  [place] [address]  Chief complaint: Follow-up for scleroderma, dermatomyositis, mild asthma  HPI: 48 y.o. who  has a past medical history of Allergy, Anxiety, Asthma, Depression, Dermatolysis, GERD (gastroesophageal reflux disease), Hypertension, Lymphedema, Neuromuscular disorder (HCC), and Pre-diabetes.  Discussed the use of AI scribe software for clinical note transcription with the patient, who gave verbal consent to proceed.  History of Present Illness Patricia Krause is a 48 year old female with dermatomyositis and scleroderma who presents for pulmonary evaluation.  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 - Dermatomyositis diagnosed in 2005 - Scleroderma diagnosed after dermatomyositis - Methotrexate  therapy initiated in 2005, with interruption from 2018 to 2021 due to lack of healthcare access - Methotrexate  resumed in 2022 under rheumatology care - No worsening of symptoms during methotrexate  interruption - Prednisone used in the past during disease flares, not used recently  Asthma - Mild asthma with rare symptoms - Albuterol  inhaler used as needed, typically only during upper respiratory infections or allergies - Asthma well-controlled  Chronic lymphedema and edema - Chronic lymphedema and swelling attributed to underlying medical conditions  Cardiac evaluation - Echocardiogram performed in 2023   Relevant Pulmonary history: Pets: Dog 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  No feather pillows or comforters No h/o chemo/XRT/amiodarone/macrodantin/MTX  No exposure to asbestos, silica or other organic allergens  Smoking history: Never smoker Travel history: Originally from [place] .  In [place]  since 2018.  No significant  recent travel Family history: No family history of lung disease   Outpatient Encounter Medications as of 06/08/2024  Medication Sig   albuterol  (VENTOLIN  HFA) 108 (90 Base) MCG/ACT inhaler Inhale 2 puffs into the lungs every 6 (six) hours as needed for wheezing or shortness of breath.   amLODipine  (NORVASC ) 5 MG tablet TAKE 1 TABLET (5 MG TOTAL) BY MOUTH DAILY.   folic acid  (FOLVITE ) 1 MG tablet TAKE 2 TABLETS(2 MG) BY MOUTH DAILY   JARDIANCE  10 MG TABS tablet TAKE 1 TABLET BY MOUTH DAILY BEFORE BREAKFAST.   methotrexate  (RHEUMATREX) 2.5 MG tablet TAKE 8 TABLETS BY MOUTH ONE TIME PER WEEK   mupirocin  ointment (BACTROBAN ) 2 % Apply 1 Application topically 2 (two) times daily. (Patient taking differently: Apply 1 Application topically 2 (two) times daily. PRN)   nystatin  cream (MYCOSTATIN ) Apply 1 Application topically 2 (two) times daily.   SODIUM FLUORIDE, DENTAL RINSE, 0.2 % SOLN Take by mouth as directed.   Vitamin D , Ergocalciferol , (DRISDOL ) 1.25 MG (50000 UNIT) CAPS capsule TAKE 1 CAPSULE (50,000 UNITS TOTAL) BY MOUTH EVERY 7 (SEVEN) DAYS   ammonium lactate (AMLACTIN) 12 % cream Apply topically. (Patient not taking: Reported on 06/08/2024)   chlorhexidine (PERIDEX) 0.12 % solution Use as directed 5 mLs in the mouth or throat daily. (Patient not taking: Reported on 06/08/2024)   fluconazole  (DIFLUCAN ) 150 MG tablet Take one tablet by mouth at the onset and repeat in 5 days. (Patient not taking: Reported on 06/08/2024)   fluticasone  (FLONASE ) 50 MCG/ACT nasal spray Place 2 sprays into both nostrils daily. (Patient not taking: Reported on 06/08/2024)   hydrocortisone  (ANUSOL -HC) 2.5 % rectal cream Place 1 Application rectally 2 (two) times daily. (Patient not taking: Reported on 06/08/2024)   hydrOXYzine  (ATARAX ) 25 MG tablet TAKE 1 TABLET BY  MOUTH THREE TIMES A DAY AS NEEDED   Semaglutide  (RYBELSUS ) 3 MG TABS Take 1 tablet (3 mg total) by mouth daily at 6 (six) AM. (Patient not taking: Reported on  06/08/2024)   No facility-administered encounter medications on file as of 06/08/2024.     Physical Exam: Today's Vitals   06/08/24 1601  BP: 122/84  Pulse: 86  Temp: 99.1 F (37.3 C)  TempSrc: Oral  SpO2: 93%  Weight: 212 lb (96.2 kg)  Height: 5' 8 (1.727 m)   Body mass index is 32.23 kg/m.  Physical Exam GEN: No acute distress CV: Regular rate and rhythm no murmurs LUNGS: Clear to auscultation bilaterally normal respiratory effort SKIN JOINTS: Warm and dry no rash Lower extremity swelling    Data Reviewed: Imaging: High resolution CT 07/05/2023- Biapical scarring, mild dependent scarring/atelectasis.  No evidence of interstitial lung disease, air trapping present I reviewed the images personally.  PFTs: 06/06/2024 FVC 3.46 [84%], FEV1 2.66 [80%], F/F77, TLC 6.08 [97%], DLCO 25.81 [105%]   Labs:  Cardiac: Echocardiogram 10/17/2021-LVEF 60 to 65%, grade 1 diastolic dysfunction normal RV systolic size and function.  Trivial tricuspid regurgitation Assessment & Plan Dermatomyositis and systemic sclerosis (scleroderma) monitoring Diagnosed with dermatomyositis in 2005 and systemic sclerosis (scleroderma) subsequently. Currently on methotrexate , resumed in 2022. No evidence of interstitial lung disease from scleroderma. Last CT scan in 2024 showed mild nonspecific scarring. Lung function test today shows mild asthma with no change from 2024. Echocardiogram in 2023 was normal, but due for another to monitor for pulmonary hypertension, a potential complication of scleroderma. - Order chest CT to monitor lung condition. - Coordinate with cardiologist for echocardiogram to assess for pulmonary hypertension.  She has an appointment with Dr. Ladona next week  Mild intermittent asthma Mild intermittent asthma with no persistent symptoms. Rarely uses albuterol  inhaler, typically only during colds or allergies. Lung function test shows mild obstruction consistent with asthma,  unchanged from previous tests. - Continue current management without daily inhaler.  Chronic lymphedema Chronic swelling present for an extended period.  Recommendations: High-resolution CT Coordinate with cardiology for echocardiogram to evaluate pulmonary hypertension  Lonna Coder MD [facility] 06/08/2024, 4:12 PM  CC: Georgina Speaks, FNP

## 2024-06-08 NOTE — Patient Instructions (Signed)
  VISIT SUMMARY: Today, you came in for a pulmonary evaluation. We reviewed your history of dermatomyositis and scleroderma, as well as your mild asthma and chronic lymphedema. We discussed your current medications and recent tests, including a lung function test and an echocardiogram.  YOUR PLAN: -DERMATOMYOSITIS AND SYSTEMIC SCLEROSIS (SCLERODERMA) MONITORING: Dermatomyositis and scleroderma are connective tissue disorders that can affect your skin, muscles, and internal organs. You are currently on methotrexate , which you resumed in 2022. Your lung function test today shows mild asthma with no change from 2024, and your last CT scan showed mild nonspecific scarring. We will order a chest CT to monitor your lung condition and coordinate with your cardiologist for an echocardiogram to check for pulmonary hypertension, a potential complication of scleroderma.  -CHRONIC LYMPHEDEMA: Chronic lymphedema is long-term swelling, often due to underlying medical conditions. We will continue to monitor this condition.  -MILD INTERMITTENT ASTHMA: Asthma is a condition where your airways can become inflamed and narrow, making it hard to breathe. Your asthma is mild and well-controlled, with rare symptoms. You should continue using your albuterol  inhaler as needed, typically during colds or allergies, and no daily inhaler is required at this time.  INSTRUCTIONS: Please schedule a chest CT scan and an echocardiogram. Continue with your current medications and management plan. Follow up with your cardiologist as needed.

## 2024-06-09 ENCOUNTER — Telehealth: Payer: Self-pay

## 2024-06-09 NOTE — Telephone Encounter (Signed)
 Dr Theophilus,   Please see Mychart messages. Okay to place order for echo?

## 2024-06-13 ENCOUNTER — Encounter: Payer: Self-pay | Admitting: Cardiology

## 2024-06-13 ENCOUNTER — Other Ambulatory Visit (HOSPITAL_COMMUNITY): Payer: Self-pay

## 2024-06-13 ENCOUNTER — Ambulatory Visit: Attending: Cardiology | Admitting: Cardiology

## 2024-06-13 VITALS — BP 115/69 | HR 85 | Resp 16 | Ht 68.0 in | Wt 216.5 lb

## 2024-06-13 DIAGNOSIS — E119 Type 2 diabetes mellitus without complications: Secondary | ICD-10-CM | POA: Diagnosis not present

## 2024-06-13 DIAGNOSIS — R9431 Abnormal electrocardiogram [ECG] [EKG]: Secondary | ICD-10-CM | POA: Diagnosis not present

## 2024-06-13 DIAGNOSIS — E782 Mixed hyperlipidemia: Secondary | ICD-10-CM

## 2024-06-13 DIAGNOSIS — R0602 Shortness of breath: Secondary | ICD-10-CM

## 2024-06-13 DIAGNOSIS — I890 Lymphedema, not elsewhere classified: Secondary | ICD-10-CM

## 2024-06-13 MED ORDER — EZETIMIBE 10 MG PO TABS
10.0000 mg | ORAL_TABLET | Freq: Every day | ORAL | 1 refills | Status: AC
  Filled 2024-06-13 (×2): qty 90, 90d supply, fill #0

## 2024-06-13 NOTE — Telephone Encounter (Signed)
 She is seeing Dr. Ladona, cardiology today.  I have sent him a message about the echocardiogram.  Nothing further needed.

## 2024-06-13 NOTE — Patient Instructions (Addendum)
 Medication Instructions:  START Zetia  10 mg daily *If you need a refill on your cardiac medications before your next appointment, please call your pharmacy*  Lab Work: LIPID Panel in 2 months  If you have labs (blood work) drawn today and your tests are completely normal, you will receive your results only by: MyChart Message (if you have MyChart) OR A paper copy in the mail If you have any lab test that is abnormal or we need to change your treatment, we will call you to review the results.  Testing/Procedures: ECHOCARDIOGRAM Your physician has requested that you have an echocardiogram. Echocardiography is a painless test that uses sound waves to create images of your heart. It provides your doctor with information about the size and shape of your heart and how well your heart's chambers and valves are working. This procedure takes approximately one hour. There are no restrictions for this procedure. Please do NOT wear cologne, perfume, aftershave, or lotions (deodorant is allowed). Please arrive 15 minutes prior to your appointment time.  Please note: We ask at that you not bring children with you during ultrasound (echo/ vascular) testing. Due to room size and safety concerns, children are not allowed in the ultrasound rooms during exams. Our front office staff cannot provide observation of children in our lobby area while testing is being conducted. An adult accompanying a patient to their appointment will only be allowed in the ultrasound room at the discretion of the ultrasound technician under special circumstances. We apologize for any inconvenience.   Lexiscan Nuclear Stress Test  Your physician has requested that you have a lexiscan myoview. For further information please visit [email or web address] Please follow instruction sheet, as given.   Follow-Up: At [facility], you and your health needs are our priority.  As part of our continuing mission to provide you with  exceptional heart care, our providers are all part of one team.  This team includes your primary Cardiologist (physician) and Advanced Practice Providers or APPs (Physician Assistants and Nurse Practitioners) who all work together to provide you with the care you need, when you need it.  Your next appointment:   AFTER LEXISCAN NUCLEAR STRESS TEST AND ECHOCARDIOGRAM  Provider:   One of our Advanced Practice Providers (APPs): Morse Clause, PA-C  Lamarr Satterfield, NP Miriam Shams, NP  Olivia Pavy, PA-C Josefa Beauvais, NP  Leontine Salen, PA-C Orren Fabry, PA-C  [place], PA-C Ernest Dick, NP  Damien Braver, NP Jon Hails, PA-C  Waddell Donath, PA-C    Dayna Dunn, PA-C  Scott Weaver, PA-C Lum Louis, NP Katlyn West, NP Callie Goodrich, PA-C  Evan Williams, PA-C Sheng Haley, PA-C  Xika Zhao, NP Kathleen Johnson, PA-C     We recommend signing up for the patient portal called MyChart.  Sign up information is provided on this After Visit Summary.  MyChart is used to connect with patients for Virtual Visits (Telemedicine).  Patients are able to view lab/test results, encounter notes, upcoming appointments, etc.  Non-urgent messages can be sent to your provider as well.   To learn more about what you can do with MyChart, go to [email or web address].   Other Instructions

## 2024-06-13 NOTE — Progress Notes (Signed)
 Cardiology Office Note:  .   Date:  06/13/2024  ID:  Newell JAYSON [place], DOB 1976-04-27, MRN [id number] PCP: Georgina Speaks, FNP  [facility] Providers Cardiologist:  None   History of Present Illness: Patricia Krause is a 48 y.o. patient who had previously seen Dr. Dann on 06/10/2023 when she was referred for evaluation of leg edema and dyspnea on exertion.  Patient has chronic bilateral lower extremity lymphedema, dermatomyositis and scleroderma and new onset diabetes mellitus presently on Jardiance  10 mg daily.    Patient was evaluated by Dr. Praveen Mannam from pulmonary standpoint on 06/08/2024 and in view of lymphedema, dyspnea out of proportion to mild intermittent asthma, referred to me for evaluation of pulmonary hypertension and to evaluate for chronic heart failure symptoms.  Patient states that her leg edema has remained stable she uses lymphatic pumps at home on a regular basis, she works from home remotely.  Cardiac Studies relevent.    ECHOCARDIOGRAM COMPLETE 10/17/2021  1. Left ventricular ejection fraction, by estimation, is 60 to 65%. Left ventricular ejection fraction by 3D volume is 65 %. The left ventricle has normal function. The left ventricle has no regional wall motion abnormalities. Left ventricular diastolic parameters are consistent with Grade I diastolic dysfunction (impaired relaxation). 2. Right ventricular systolic function is normal. The right ventricular size is normal. Tricuspid regurgitation signal is inadequate for assessing PA pressure. 3. The inferior vena cava is normal in size with greater than 50% respiratory variability, suggesting right atrial pressure of 3 mmHg.   Lower extremity venous duplex for insufficiency right leg 11/11/2021: - Deep vien reflux in the CFV, and proximal FV.  - Superficial vein reflux in the SSV, SFJ, and GSV.  - Vascularized lymph node seen in the groin.   High-resolution CT chest 07/05/2023: Heart is  enlarged. Biapical pleuroparenchymal scarring.  Mild dependent scarring and atelectasis.  No evidence of interstitial lung disease.    Discussed the use of AI scribe software for clinical note transcription with the patient, who gave verbal consent to proceed.  History of Present Illness Patricia Krause is a 48 year old female with dermatomyositis and scleroderma who presents with pulmonary hypertension and leg swelling. She was referred by Dr. Praveen Mannam for evaluation of pulmonary hypertension due to leg swelling.  She experiences exertional dyspnea, particularly during activities like fast walking or running, but not at rest. Her exercise tolerance has remained stable over the past year. She uses a compression pump daily for leg swelling and elevates her legs while sleeping. Her weight is generally stable.  She was recently diagnosed with diabetes and is taking Jardiance , with an A1c of 7.3%. She is addressing high triglycerides through dietary modifications.  She experiences nocturia, waking up a couple of times at night to urinate, and limits fluid intake in the evenings. 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   Labs   Lab Results  Component Value Date   CHOL 155 03/14/2024   HDL 35 (L) 03/14/2024   LDLCALC 84 03/14/2024   TRIG 212 (H) 03/14/2024   CHOLHDL 4.4 03/14/2024   No results found for: LIPOA  Recent Labs    09/14/23 1531 09/30/23 1631 02/29/24 1528 03/14/24 1619  NA 138 137 135  --   K 4.0 4.2 4.3  --   CL 102 100 100  --   CO2 22 24 27   --   GLUCOSE 189* 140*  177*  --   BUN 8 9 13   --   CREATININE 0.47* 0.46* 0.51  --   CALCIUM 9.1 9.4 10.3* 9.8    Lab Results  Component Value Date   ALT 30 (H) 02/29/2024   AST 32 02/29/2024   ALKPHOS 62 09/14/2023   BILITOT 0.4 02/29/2024      Latest Ref Rng & Units 02/29/2024    3:28 PM 09/14/2023    3:31 PM 05/11/2023    3:51 PM  CBC  WBC 3.8 - 10.8 Thousand/uL 7.3   6.2  6.0   Hemoglobin 11.7 - 15.5 g/dL 86.9  87.3  87.4   Hematocrit 35.0 - 45.0 % 38.2  36.6  37.0   Platelets 140 - 400 Thousand/uL 289  282  297    Lab Results  Component Value Date   HGBA1C 7.3 (H) 03/14/2024    Lab Results  Component Value Date   TSH 3.710 01/21/2022    Last vitamin D  Lab Results  Component Value Date   VD25OH 37.0 02/25/2023   ROS  Review of Systems  Cardiovascular:  Positive for dyspnea on exertion. Negative for chest pain, leg swelling, orthopnea and paroxysmal nocturnal dyspnea.   Physical Exam:   VS:  BP 115/69 (BP Location: Right Arm, Patient Position: Sitting, Cuff Size: Normal)   Pulse 85   Resp 16   Ht 5' 8 (1.727 m)   Wt 216 lb 8 oz (98.2 kg)   SpO2 97%   BMI 32.92 kg/m    Wt Readings from Last 3 Encounters:  06/13/24 216 lb 8 oz (98.2 kg)  06/08/24 212 lb (96.2 kg)  03/14/24 219 lb (99.3 kg)    BP Readings from Last 3 Encounters:  06/13/24 115/69  06/08/24 122/84  03/21/24 124/80   Physical Exam Neck:     Vascular: No carotid bruit or JVD.  Cardiovascular:     Rate and Rhythm: Normal rate and regular rhythm.     Pulses: Intact distal pulses.     Heart sounds: Normal heart sounds. No murmur heard.    No gallop.  Pulmonary:     Effort: Pulmonary effort is normal.     Breath sounds: Normal breath sounds.  Abdominal:     General: Bowel sounds are normal.     Palpations: Abdomen is soft.  Musculoskeletal:     Right lower leg: Edema (marked non pitting lymphedema R>L) present.     Left lower leg: Edema (marked non pitting lymphedema R>L) present.    EKG:    EKG Interpretation Date/Time:  Tuesday June 13 2024 16:16:53 EDT Ventricular Rate:  83 PR Interval:  174 QRS Duration:  112 QT Interval:  388 QTC Calculation: 455 R Axis:   -73  Text Interpretation: .  EKG 06/13/2024: Normal sinus rhythm with rate of 83 bpm, left anterior fascicular block.  Incomplete right bundle branch block.  Poor R wave progression, consider  pulmonary disease pattern.  Diffuse nonspecific T wave abnormality inferior leads and anterolateral leads.  Compared to 06/10/2023, T wave inversion new. Confirmed by Alcides Nutting, Jagadeesh ([id number]) on 06/13/2024 4:26:54 PM    ASSESSMENT AND PLAN: .      ICD-10-CM   1. Shortness of breath  R06.02 EKG 12-Lead    MYOCARDIAL PERFUSION IMAGING    Cardiac Stress Test: Informed Consent Details: Physician/Practitioner Attestation; Transcribe to consent form and obtain patient signature    ECHOCARDIOGRAM COMPLETE    2. Nonspecific abnormal electrocardiogram (ECG) (EKG)  R94.31 MYOCARDIAL PERFUSION IMAGING  Cardiac Stress Test: Informed Consent Details: Physician/Practitioner Attestation; Transcribe to consent form and obtain patient signature    ECHOCARDIOGRAM COMPLETE    3. Lymphedema  I89.0     4. Mixed hypercholesterolemia and hypertriglyceridemia  E78.2 ezetimibe  (ZETIA ) 10 MG tablet    Lipid panel    Lipid panel    5. Type 2 diabetes mellitus without complication, without long-term current use of insulin  (HCC)  E11.9 ezetimibe  (ZETIA ) 10 MG tablet    MYOCARDIAL PERFUSION IMAGING    Cardiac Stress Test: Informed Consent Details: Physician/Practitioner Attestation; Transcribe to consent form and obtain patient signature     Assessment & Plan Lymphedema and mild venous insufficiency of both lower extremities Chronic lymphedema and mild venous insufficiency in both lower extremities. Managed medically as there is no surgical cure. Ultrasound from 2023 showed reflux in the common femoral vein, indicating venous insufficiency and again there is no treatment for deep vein insufficiency.  She also has lymphedema and is managed with regular use of pulm, support stockings and keeping her foot elevated.. - Continue use of compression pump daily - Advise elevation of legs using pillows when resting - Avoid recliners; use flat sofa with feet elevated  Dermatomyositis and systemic sclerosis  (scleroderma) Diagnosed with dermatomyositis and systemic sclerosis (scleroderma). Under care of a rheumatologist. Risk of cardiac involvement such as cardiomyopathy, but no current evidence of heart failure or cardiac involvement. - Check echocardiogram.  I reviewed her CT scan shows cardiomegaly.  Type 2 diabetes mellitus Recently diagnosed with type 2 diabetes mellitus. Current A1c is 7.3%. On Jardiance , which is well-tolerated and effective. - Continue Jardiance  for diabetes management - In view of diabetes mellitus, dyspnea on exertion, new EKG changes revealing T wave inversion in inferior leads and anterolateral leads, she will need stress testing as well.  Mixed hyperlipidemia Mixed hyperlipidemia with elevated triglycerides. Diabetic status increases cardiovascular risk. Statins avoided due to potential exacerbation of dermatomyositis. Zetia  chosen as it does not affect muscles. - Start Zetia  for cholesterol management - Advise dietary modifications to reduce fried and salty foods - Recheck cholesterol levels in 2 months, goal LDL at least <70 without use of statin seen well dermatomyositis.  However presence of autoimmune disease, diabetes mellitus also increases her risk for vascular disease significantly  Abnormal electrocardiogram (EKG) Recent EKG shows mild abnormalities with new T wave changes in inferior and anterolateral leads compared to previous EKG. Changes may not be significant but warrant further investigation. - Order echocardiogram - Schedule nuclear stress test  Shortness of breath on exertion Shortness of breath occurs with exertion but has not worsened over the past year. Maintains regular exercise. Evaluated in context of potential pulmonary hypertension and cardiac issues. - Order echocardiogram to assess cardiac function - Schedule nuclear stress test  Umbilical hernia Chronic umbilical hernia present for some time. Risk of incarceration if intestines become  trapped, requiring emergency intervention. Surgical repair involves placing a mesh to prevent incarceration. - Consider surgical consultation for hernia repair   Follow up: Will see her back following echocardiogram and stress test, if normal, she will see us  back on a as needed basis.  Obviously if echocardiogram reveals RV strain, she will need right heart catheterization.  If stress test is abnormal, would have a low threshold to perform cardiac catheterization as well.  Will follow-up on the lipids, if LDL is not at goal, could consider WelChol 2 tablets twice daily and rechecking her lipids.  PCSK9 inhibitors could be considered as a last resort  if necessary.  Signed,  Gordy Bergamo, MD, [facility] 06/13/2024, 9:22 PM [facility] [address] [place], [place] [address] Phone: [phone]. Fax:  [phone]

## 2024-06-14 ENCOUNTER — Encounter: Payer: Self-pay | Admitting: Cardiology

## 2024-06-14 DIAGNOSIS — F4320 Adjustment disorder, unspecified: Secondary | ICD-10-CM | POA: Diagnosis not present

## 2024-06-14 DIAGNOSIS — F4310 Post-traumatic stress disorder, unspecified: Secondary | ICD-10-CM | POA: Diagnosis not present

## 2024-06-15 ENCOUNTER — Ambulatory Visit
Admission: RE | Admit: 2024-06-15 | Discharge: 2024-06-15 | Disposition: A | Discharge status: Home / Self Care | Source: Ambulatory Visit | Attending: Pulmonary Disease | Admitting: Pulmonary Disease

## 2024-06-15 DIAGNOSIS — J849 Interstitial pulmonary disease, unspecified: Secondary | ICD-10-CM

## 2024-06-15 DIAGNOSIS — M349 Systemic sclerosis, unspecified: Secondary | ICD-10-CM

## 2024-06-19 ENCOUNTER — Telehealth (HOSPITAL_COMMUNITY): Payer: Self-pay

## 2024-06-19 NOTE — Telephone Encounter (Signed)
 Detailed instructions left on the patient's answering machine. S.Tayjah Lobdell CCT

## 2024-06-20 ENCOUNTER — Ambulatory Visit: Payer: Self-pay | Admitting: Pulmonary Disease

## 2024-06-20 NOTE — Progress Notes (Unsigned)
 Office Visit Note  Patient: Patricia Krause             Date of Birth: 02-15-1976           MRN: [id number]             PCP: Georgina Speaks, FNP Referring: Georgina Speaks, FNP Visit Date: 07/04/2024 Occupation: @GUAROCC @  Subjective:  Medication monitoring    History of Present Illness: Patricia Krause is a 48 y.o. female with history of dermatomyositis and scleroderma.  Patient remains on methotrexate  8 tablets by mouth once weekly and folic acid  2 mg daily.  She has been tolerating methotrexate  without any side effects and has not had any recent gaps in therapy.  Patient denies any signs or symptoms of a myositis flare recently.  She has not noticed any increased muscular weakness.  Patient has continued to try to exercise 5 days a week which typically includes walking in place or dancing at home.  Patient reports that she has established care with Dr. Theophilus and Dr. Ladona.  She has undergone a high-resolution chest CT, pulmonary function testing, and a stress test.  According to the patient she is scheduled for an echocardiogram on 07/18/2024.  Patient states that she was also recently diagnosed with diabetes.  She has been started on Jardiance  and hemoglobin A1c has improved.  Patient states that there was concern for proteinuria so she has been referred to nephrology.  She had a renal ultrasound which was normal and will be following back up with nephrology in October. She denies any symptoms of Raynaud's phenomenon recently.  She remains on amlodipine  5 mg daily.  Her blood pressure has been well-controlled.  She denies any increased shortness of breath. She denies any increased reflux or dysphagia. She experiences intermittent discomfort in both knees but denies any joint swelling. She remains on amlodipine  5 mg 1 tablet by mouth daily.     Activities of Daily Living:  Patient reports morning stiffness for less than 5 minutes.   Patient Denies nocturnal pain.  Difficulty  dressing/grooming: Denies Difficulty climbing stairs: Reports Difficulty getting out of chair: Denies Difficulty using hands for taps, buttons, cutlery, and/or writing: Reports  Review of Systems  Constitutional:  Positive for fatigue.  HENT:  Positive for mouth dryness. Negative for mouth sores.   Eyes:  Negative for dryness.  Respiratory:  Negative for shortness of breath.   Cardiovascular:  Negative for chest pain and palpitations.  Gastrointestinal:  Positive for diarrhea. Negative for blood in stool and constipation.  Endocrine: Positive for increased urination.  Genitourinary:  Negative for involuntary urination.  Musculoskeletal:  Positive for joint pain, gait problem, joint pain, myalgias, muscle weakness, morning stiffness, muscle tenderness and myalgias. Negative for joint swelling.  Skin:  Positive for color change and sensitivity to sunlight. Negative for rash and hair loss.  Allergic/Immunologic: Negative for susceptible to infections.  Neurological:  Positive for headaches. Negative for dizziness.  Hematological:  Positive for swollen glands.  Psychiatric/Behavioral:  Negative for depressed mood and sleep disturbance. The patient is nervous/anxious.     PMFS History:  Patient Active Problem List   Diagnosis Date Noted   Abnormal gamma globulin level 06/08/2024   Rash 06/08/2024   Need for pneumococcal 20-valent conjugate vaccination 03/19/2024   Class 1 obesity with body mass index (BMI) of 33.0 to 33.9 in adult 03/19/2024   Proteinuria 03/19/2024   Encounter for general health examination 09/14/2023   Scleroderma (HCC) 06/15/2023   Mild  intermittent asthma without complication 06/15/2023   Low HDL (under 40) 02/25/2023   Lymphedema 02/25/2023   Class 1 obesity due to excess calories without serious comorbidity with body mass index (BMI) of 31.0 to 31.9 in adult 02/25/2023   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   Raynaud's syndrome without gangrene  12/02/2021   History of asthma 11/06/2021   Vitamin D  deficiency 11/06/2021   Prediabetes 11/06/2021   Essential hypertension 11/06/2021   Scarring alopecia 08/18/2016   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   Cancer Paternal Aunt        uncertain what type of cancer   Diabetes Paternal Grandmother    Alzheimer's disease Paternal Grandfather    High Cholesterol Brother    Diabetes Son    Autism Son    Intellectual disability Son    Learning disabilities Son    Colon cancer Neg Hx    Breast cancer Neg Hx    Past Surgical History:  Procedure Laterality Date   BREAST EXCISIONAL BIOPSY Left    BREAST SURGERY     COLONOSCOPY  03/2022   WISDOM TOOTH EXTRACTION     Social History   Social History Narrative   Not on file   Immunization History  Administered Date(s) Administered   DT (Pediatric) 05/07/1991   DTP 26-Mar-1978, 03/26/1978, 09/20/1978, 10/21/1979, 03/30/1985   Hep B, Unspecified 04/20/1996, 05/22/1996, 12/22/1996   Influenza Split 11/27/2010, 09/10/2011   Influenza, Seasonal, Injecte, Preservative Fre 06/29/2024   Influenza,inj,Quad PF,6+ Mos 06/29/2013, 10/15/2014, 08/29/2015, 08/18/2016, 07/23/2021, 09/08/2022   Influenza-Unspecified 08/18/2005, 08/11/2006, 08/29/2007, 07/23/2009   MMR 05/30/1979, 07/07/1989, 06/10/1990   OPV 03/26/1978, 06/08/1978, 09/20/1978, 03/30/1985   PFIZER(Purple Top)SARS-COV-2 Vaccination 12/29/2019, 01/23/2020, 08/02/2020, 11/06/2022, 06/26/2023   PNEUMOCOCCAL CONJUGATE-20 03/14/2024   PPD Test 05/30/1979, 04/19/1998   Pfizer Covid-19 Vaccine Bivalent Booster 15yrs & up  07/05/2021   Pfizer(Comirnaty)Fall Seasonal Vaccine 12 years and older 07/02/2024   Pneumococcal Polysaccharide-23 10/15/2014, 08/20/2021   Tdap 09/10/2011, 08/20/2021     Objective: Vital Signs: BP 122/75   Pulse 83   Temp 98.2 F (36.8 C)   Resp 14   Ht 5' 8 (1.727 m)   Wt 215 lb 4.8 oz (97.7 kg)   BMI 32.74 kg/m    Physical Exam Vitals and nursing note reviewed.  Constitutional:      Appearance: She is well-developed.  HENT:     Head: Normocephalic and atraumatic.  Eyes:     Conjunctiva/sclera: Conjunctivae normal.  Cardiovascular:     Rate and Rhythm: Normal rate and regular rhythm.     Heart sounds: Normal heart sounds.  Pulmonary:     Effort: Pulmonary effort is normal.     Breath sounds: Normal breath sounds.  Abdominal:     General: Bowel sounds are normal.     Palpations: Abdomen is soft.  Musculoskeletal:     Cervical back: Normal range of motion.  Lymphadenopathy:     Cervical: No cervical adenopathy.  Skin:  General: Skin is warm and dry.     Capillary Refill: Capillary refill takes 2 to 3 seconds.     Comments: Nailbed capillary changes with dilated blood vessels was noted.  Sclerodactyly was noted.  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  Poikiloderma was noted.    Neurological:    Neurological:     Mental Status: She is alert and oriented to person, place, and time.  Psychiatric:        Behavior: Behavior normal.      Musculoskeletal Exam: C-spine has limited range of motion without rotation.  Thoracic kyphosis noted.  Shoulder joint abduction to about 140 degrees bilaterally.  Flexion contractures of both elbows.  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  Loss of digital tufts.  Hip joints have good range of motion with no groin pain.  Full strength of upper and lower extremities noted.  Lymphedema noted in bilateral lower extremities.  No warmth or effusion of knee joints noted.  CDAI Exam: CDAI Score: -- Patient Global: --;  Provider Global: -- Swollen: --; Tender: -- Joint Exam 07/04/2024   No joint exam has been documented for this visit   There is currently no information documented on the homunculus. Go to the Rheumatology activity and complete the homunculus joint exam.  Investigation: No additional findings.  Imaging: MYOCARDIAL PERFUSION/CT RAD READ Result Date: 07/02/2024 CLINICAL DATA:  This over-read does not include interpretation of cardiac or coronary anatomy or pathology. The cardiac PET-CT interpretation by the cardiologist is attached. COMPARISON:  None Available. FINDINGS: Mild atelectasis or scarring seen in the posterior right upper lobe. No suspicious nodules, masses, or infiltrates are identified in the visualized portion of the lungs. No pleural fluid seen. The visualized portions of the mediastinum and chest wall are unremarkable. IMPRESSION: Mild atelectasis or scarring in posterior right upper lobe. No evidence of pulmonary mass or consolidation. Electronically Signed   By: Norleen DELENA Kil M.D.   On: 07/02/2024 13:55   MYOCARDIAL PERFUSION IMAGING Result Date: 06/27/2024   ECG is normal. ECG rhythm shows normal sinus rhythm. ECG demonstrates right bundle branch block.   A pharmacological stress test was performed using IV Lexiscan  0.4mg  over 10 seconds performed without concurrent submaximal exercise. The patient reported headaches during the stress test. Normal blood pressure and normal heart rate response noted during stress. Heart rate recovery was normal.   No ST deviation was noted from baseline EKG. ECG was interpretable and without significant changes. The ECG was not diagnostic due to pharmacologic protocol.   LV perfusion is normal. There is no evidence of ischemia. There is no evidence of infarction.   Left ventricular function is normal. Nuclear stress EF: 71%. End diastolic cavity size is normal. End systolic cavity size is normal. No evidence of transient ischemic dilation (TID) noted.    CT images were obtained for attenuation correction and were examined for the presence of coronary calcium when appropriate.   Coronary calcium was absent on the attenuation correction CT images.   The study is normal. The study is low risk.   CT CHEST HIGH RESOLUTION Result Date: 06/18/2024 CLINICAL DATA:  Interstitial lung disease, dermatomyositis and scleroderma EXAM: CT CHEST WITHOUT CONTRAST TECHNIQUE: Multidetector CT imaging of the chest was performed following the standard protocol without intravenous contrast. High resolution imaging of the lungs, as well as inspiratory and expiratory imaging, was performed. RADIATION DOSE REDUCTION: This exam was performed according to the departmental dose-optimization program which includes automated exposure control, adjustment of  the mA and/or kV according to patient size and/or use of iterative reconstruction technique. COMPARISON:  None Available. FINDINGS: Cardiovascular: No significant vascular findings. Normal heart size. No pericardial effusion. Mediastinum/Nodes: No enlarged mediastinal, hilar, or axillary lymph nodes. Thyroid  gland, trachea, and esophagus demonstrate no significant findings. Lungs/Pleura: Areas of bland appearing, bandlike scarring in the right upper lobe (series 11, image 83) and in the left-greater-than-right dependent lung bases, with elevation of the left hemidiaphragm (series 11, image 222). Additional biapical pleural-parenchymal scarring. Extensive lobular air trapping on expiratory phase imaging. No pleural effusion or pneumothorax. Upper Abdomen: No acute abnormality. Musculoskeletal: No chest wall abnormality. No acute osseous findings. IMPRESSION: 1. Areas of bland appearing, bandlike scarring in the right upper lobe and in the left-greater-than-right dependent lung bases, with elevation of the left hemidiaphragm. These are somewhat increased when compared to prior examination dated 06/22/2023 but character is nonetheless strongly  suggestive of sequelae of infection or aspiration rather than fibrotic interstitial lung disease. 2. Extensive lobular air trapping on expiratory phase imaging, consistent with small airways disease. Electronically Signed   By: Marolyn JONETTA Jaksch M.D.   On: 06/18/2024 07:22    Recent Labs: Lab Results  Component Value Date   WBC 7.3 02/29/2024   HGB 13.0 02/29/2024   PLT 289 02/29/2024   NA 136 06/29/2024   K 4.0 06/29/2024   CL 99 06/29/2024   CO2 22 06/29/2024   GLUCOSE 168 (H) 06/29/2024   BUN 12 06/29/2024   CREATININE 0.44 (L) 06/29/2024   BILITOT 0.4 02/29/2024   ALKPHOS 62 09/14/2023   AST 32 02/29/2024   ALT 30 (H) 02/29/2024   PROT 7.6 03/14/2024   ALBUMIN 4.2 09/14/2023   CALCIUM 9.4 06/29/2024   GFRAA >60 12/28/2019   QFTBGOLDPLUS NEGATIVE 11/06/2021    Speciality Comments: Dx in 2005-presented postpartum '04. Left bicep muscle bx 2005 + skin bx+. IVIG in the past.Ttd with MTX, PLQ and MMF. Off all meds since 2018  Procedures:  No procedures performed Allergies: Prunus persica, Cat dander, Mixed ragweed, and Pollinex-t [modified tree tyrosine adsorbate]     Assessment / Plan:     Visit Diagnoses: Adult onset dermatomyositis (HCC) - Dx in 2005-presented postpartum '04. Left bicep muscle bx 2005 + skin bx+. IVIG, muscle wasting, poikiloderma, sclerodactyly, calcinosis, capillary changes:  CK has been steadily rising since 02/29/2024.  CK was 447 on 02/29/2024, 628 on 05/16/2024, and 726 on 05/30/2024.  She is not experiencing any symptoms of a myositis flare.  She has not noticed any increased muscular weakness.  She had full strength of upper and lower extremities on examination today.  She has been able to perform her normal exercise regimen 5 days a week including dancing at home or walking in place.  She has not been performing any strenuous exercises or weight lifting. She has no synovitis on examination today. She has remained on methotrexate  8 tablets by mouth once weekly  and folic acid  2 mg daily without any gaps in therapy. Plan to recheck CK today.  If CK remains elevated or continues to rise discussed the option of either increasing the dose of methotrexate  or switching to CellCept.  Discussed the option of switching to injectable methotrexate  to try to increase efficacy but she has declined. She will notify us  if she develops any new or worsening symptoms. Discussing increased risk for malignancy in patients with dermatomyositis.  Discussed the importance of remaining up-to-date with all age-related cancer screenings. - Plan: CK  Scleroderma (HCC) -  Serology was negative for scleroderma, although her clinical features are consistent with scleroderma:  She has not had any symptoms of Raynaud's phenomenon recently.  No digital ulcerations noted today. Sclerodactyly noted but appears unchanged. No synovitis on exam. No recent rashes. Discussed the increased risk for renal crisis in patients with scleroderma.  Discussed the importance of close blood pressure monitoring.  Her blood pressure was 122/75 today in the office.  She remains on amlodipine  5 mg 1 tablet daily. She has not noticed any increased reflux or dysphagia. No new or worsening pulmonary symptoms.  She is established care with Dr. Theophilus and Dr. Ladona.  Patient has had a stress test, high-resolution chest CT, and pulmonary function testing.  No evidence of ILD.  Patient is scheduled for an echocardiogram on 07/18/2024 for pulmonary hypertension screening. She remains on methotrexate  8 tablets by mouth once weekly and folic acid  2 mg daily.  She was advised to notify us  if she develops any new or worsening symptoms.  High risk medication use - Methotrexate  8 tablets by mouth once weekly and folic acid  2 mg daily-gap in therapy from 2018 until restarting in February 2023.  Flu shot performed on 06/29/24  Covid-19 vaccine performed on 07/02/24.  BMP updated on 06/29/24.  CBC and hepatic function panel  updated today. Discussed the importance of holding methotrexate  if she develops signs or symptoms of an infection and to resume once the infection has completely cleared.  Previous therapy includes Plaquenil, CellCept, and IVIG IUD in place--normal position noted on ultrasound from 03/21/2024. - Plan: CBC with Differential/Platelet, Hepatic function panel  Raynaud's syndrome without gangrene: Not currently symptomatic.  Her symptoms of Raynaud's phenomenon are typically exacerbated by cold weather temperatures.  Discussed the importance of keeping her core body temperature warm and drinking warm fluids.  Patient will remain on amlodipine  5 mg 1 tablet daily.  Shortness of breath - High resolution chest CT 06/22/23: no evidence of ILD. PFTs updated on 06/15/23. Cardiology visit 06/10/23--Dr. Dann.  Echocardiogram normal in 2023. Patient recently established care with Dr. Garey function testing performed on 06/06/2024-mild asthma with no change from 2024..  High-resolution chest CT performed on 06/15/2024: Areas of bland appearing, bandlike scarring in the right upper lobe and the left greater than right dependent lung bases with elevation of the left hemidiaphragm.  These are somewhat increased when compared to prior exam on 06/22/2023 but character is nonetheless strongly suggestive of sequelae of infection or aspiration rather than fibrotic interstitial lung disease. Scheduled for an echocardiogram to assess for pulmonary hypertension on 07/18/2024.  Patient is under the care of Dr. Ladona.  Pain in both hands: No synovitis noted on examination today.  Pedal edema - Lymphadema bilateral LE.  Patient has established care with Dr. Ladona.  Essential hypertension - BP 122/75 today in the office. She remains on amlodipine  5 mg daily.  Other medical conditions are listed as follows:  History of asthma: Now under the care of Dr. Theophilus.  Vitamin D  deficiency  History of type 2 diabetes  mellitus - Hgb A1c 6.2% on 06/29/24-Jardiance  has improved her hemoglobin A1c.  History of proteinuria syndrome: Patient has established care with nephrology. Renal ultrasound performed on 05/16/2024: No acute or significant findings by renal ultrasound noted.  Other fatigue  Orders: Orders Placed This Encounter  Procedures   CBC with Differential/Platelet   CK   Hepatic function panel   No orders of the defined types were placed in this encounter.   Follow-Up Instructions:  Return in about 3 months (around 10/03/2024) for Dermatomyositis, Scleroderma.   Waddell CHRISTELLA Craze, PA-C  Note - This record has been created using Dragon software.  Chart creation errors have been sought, but may not always  have been located. Such creation errors do not reflect on  the standard of medical care.

## 2024-06-23 ENCOUNTER — Other Ambulatory Visit: Payer: Self-pay | Admitting: Cardiology

## 2024-06-23 DIAGNOSIS — E119 Type 2 diabetes mellitus without complications: Secondary | ICD-10-CM

## 2024-06-23 DIAGNOSIS — R0602 Shortness of breath: Secondary | ICD-10-CM

## 2024-06-23 DIAGNOSIS — R9431 Abnormal electrocardiogram [ECG] [EKG]: Secondary | ICD-10-CM

## 2024-06-27 ENCOUNTER — Ambulatory Visit (HOSPITAL_COMMUNITY)
Admission: RE | Admit: 2024-06-27 | Discharge: 2024-06-27 | Disposition: A | Discharge status: Home / Self Care | Source: Ambulatory Visit | Attending: Cardiology | Admitting: Cardiology

## 2024-06-27 ENCOUNTER — Other Ambulatory Visit: Payer: Self-pay | Admitting: Nurse Practitioner

## 2024-06-27 DIAGNOSIS — R9431 Abnormal electrocardiogram [ECG] [EKG]: Secondary | ICD-10-CM | POA: Diagnosis not present

## 2024-06-27 DIAGNOSIS — M349 Systemic sclerosis, unspecified: Secondary | ICD-10-CM

## 2024-06-27 DIAGNOSIS — M3310 Other dermatopolymyositis, organ involvement unspecified: Secondary | ICD-10-CM

## 2024-06-27 DIAGNOSIS — Z79899 Other long term (current) drug therapy: Secondary | ICD-10-CM

## 2024-06-27 DIAGNOSIS — R0602 Shortness of breath: Secondary | ICD-10-CM | POA: Diagnosis not present

## 2024-06-27 DIAGNOSIS — E119 Type 2 diabetes mellitus without complications: Secondary | ICD-10-CM | POA: Diagnosis not present

## 2024-06-27 LAB — MYOCARDIAL PERFUSION IMAGING
Base ST Depression (mm): 0 mm
LV dias vol: 130 mL (ref 46–106)
LV sys vol: 35 mL (ref 3.8–5.2)
Nuc Stress EF: 71 %
Peak HR: 100 {beats}/min
Rest HR: 80 {beats}/min
Rest Nuclear Isotope Dose: 10.6 mCi
SDS: 1
SRS: 4
SSS: 3
ST Depression (mm): 0 mm
TID: 1.14

## 2024-06-27 MED ORDER — TECHNETIUM TC 99M TETROFOSMIN IV KIT
10.6000 | PACK | Freq: Once | INTRAVENOUS | Status: AC | PRN
  Administered 2024-06-27: 10.6 via INTRAVENOUS

## 2024-06-27 MED ORDER — REGADENOSON 0.4 MG/5ML IV SOLN
0.4000 mg | Freq: Once | INTRAVENOUS | Status: AC
  Administered 2024-06-27: 0.4 mg via INTRAVENOUS

## 2024-06-27 MED ORDER — REGADENOSON 0.4 MG/5ML IV SOLN
INTRAVENOUS | Status: AC
  Filled 2024-06-27: qty 5

## 2024-06-27 MED ORDER — TECHNETIUM TC 99M TETROFOSMIN IV KIT
32.7000 | PACK | Freq: Once | INTRAVENOUS | Status: AC | PRN
  Administered 2024-06-27: 32.7 via INTRAVENOUS

## 2024-06-28 ENCOUNTER — Ambulatory Visit: Payer: Self-pay | Admitting: Cardiology

## 2024-06-28 ENCOUNTER — Encounter: Payer: Self-pay | Admitting: Cardiology

## 2024-06-28 NOTE — Progress Notes (Signed)
 Normal stress test. Low risk. Presence of coronary calcification suggests need for aggressive risk modification.

## 2024-06-29 ENCOUNTER — Ambulatory Visit: Admitting: Nurse Practitioner

## 2024-06-29 ENCOUNTER — Encounter: Payer: Self-pay | Admitting: Nurse Practitioner

## 2024-06-29 VITALS — BP 120/70 | HR 89 | Temp 98.5°F | Ht 68.0 in | Wt 217.0 lb

## 2024-06-29 DIAGNOSIS — E1169 Type 2 diabetes mellitus with other specified complication: Secondary | ICD-10-CM

## 2024-06-29 DIAGNOSIS — R771 Abnormality of globulin: Secondary | ICD-10-CM

## 2024-06-29 DIAGNOSIS — L930 Discoid lupus erythematosus: Secondary | ICD-10-CM

## 2024-06-29 DIAGNOSIS — Z23 Encounter for immunization: Secondary | ICD-10-CM

## 2024-06-29 DIAGNOSIS — I10 Essential (primary) hypertension: Secondary | ICD-10-CM | POA: Diagnosis not present

## 2024-06-29 DIAGNOSIS — E669 Obesity, unspecified: Secondary | ICD-10-CM | POA: Diagnosis not present

## 2024-06-29 DIAGNOSIS — M3310 Other dermatopolymyositis, organ involvement unspecified: Secondary | ICD-10-CM

## 2024-06-29 DIAGNOSIS — E559 Vitamin D deficiency, unspecified: Secondary | ICD-10-CM | POA: Diagnosis not present

## 2024-06-29 NOTE — Progress Notes (Signed)
 [name], CMA,acting as a Neurosurgeon for [name], FNP.,have documented all relevant documentation on the behalf of [name], FNP,as directed by  [name], FNP while in the presence of [name], FNP.  Subjective:  Patient ID: Patricia Krause , female    DOB: 03-02-1976 , 48 y.o.   MRN: [id number]  Chief Complaint  Patient presents with   Hypertension    Patient presents today for a bp and dm follow up, Patient reports compliance with medication. Patient denies any chest pain, SOB, or headaches. Patient has no concerns today.     HPI Discussed the use of AI scribe software for clinical note transcription with the patient, who gave verbal consent to proceed.  History of Present Illness Patricia Krause is a 48 year old female with diabetes who presents for a follow-up visit.  She is currently taking Jardiance  for diabetes management without any issues and has switched to a three-month prescription for convenience.  She is undergoing evaluation for kidney issues. Recent blood tests and kidney and bladder ultrasounds were performed; the patient was told her kidneys are working fine, but she was also told that they were leaking protein. She is scheduled to see a nephrologist next month for further evaluation.  Her blood work showed a positive gamma globulin test. She has not yet been scheduled with a hematologist/oncologist for further testing, and she is unaware of any attempts to contact her for this referral.  She recently had a normal stress test, but an echocardiogram is scheduled for next month due to an unusual finding in her EKG compared to last year. A chest CT scan was normal, although she has mild asthma and a small scar on her lungs.  She had a past episode of right leg pain that resolved spontaneously and a yeast infection that was successfully treated. She has received her flu shot and plans to get the COVID vaccine at a pharmacy.   Diabetes She presents for  her follow-up diabetic visit. She has type 2 (this is a new diagnosis) diabetes mellitus. Her disease course has been stable. There are no hypoglycemic associated symptoms. Pertinent negatives for diabetes include no fatigue, no polydipsia, no polyphagia and no polyuria. There are no hypoglycemic complications.     Past Medical History:  Diagnosis Date   Allergy    Anxiety    Asthma    Depression    Dermatolysis    GERD (gastroesophageal reflux disease)    Hypertension    Lymphedema    dx by vascular doctor   Neuromuscular disorder (HCC)    Pre-diabetes      Family History  Problem Relation Age of Onset   Diabetes Father    Cancer Father        prostate cancer in her 54s.   Cancer Paternal Aunt        uncertain what type of cancer   Diabetes Paternal Grandmother    Alzheimer's disease Paternal Grandfather    High Cholesterol Brother    Diabetes Son    Autism Son    Intellectual disability Son    Learning disabilities Son    Colon cancer Neg Hx    Breast cancer Neg Hx      Current Outpatient Medications:    albuterol  (VENTOLIN  HFA) 108 (90 Base) MCG/ACT inhaler, Inhale 2 puffs into the lungs every 6 (six) hours as needed for wheezing or shortness of breath., Disp: 8 g, Rfl: 2   amLODipine  (NORVASC ) 5 MG  tablet, TAKE 1 TABLET (5 MG TOTAL) BY MOUTH DAILY., Disp: 90 tablet, Rfl: 1   ezetimibe  (ZETIA ) 10 MG tablet, Take 1 tablet (10 mg total) by mouth daily., Disp: 90 tablet, Rfl: 1   folic acid  (FOLVITE ) 1 MG tablet, TAKE 2 TABLETS(2 MG) BY MOUTH DAILY, Disp: 180 tablet, Rfl: 3   hydrOXYzine  (ATARAX ) 25 MG tablet, TAKE 1 TABLET BY MOUTH THREE TIMES A DAY AS NEEDED, Disp: 270 tablet, Rfl: 1   JARDIANCE  10 MG TABS tablet, TAKE 1 TABLET BY MOUTH DAILY BEFORE BREAKFAST., Disp: 90 tablet, Rfl: 1   methotrexate  (RHEUMATREX) 2.5 MG tablet, TAKE 8 TABLETS BY MOUTH ONE TIME PER WEEK, Disp: 96 tablet, Rfl: 0   mupirocin  ointment (BACTROBAN ) 2 %, Apply 1 Application topically 2 (two)  times daily. (Patient taking differently: Apply 1 Application topically 2 (two) times daily. PRN), Disp: 30 g, Rfl: 3   nystatin  cream (MYCOSTATIN ), Apply 1 Application topically 2 (two) times daily. (Patient not taking: Reported on 07/04/2024), Disp: 30 g, Rfl: 1   SODIUM FLUORIDE, DENTAL RINSE, 0.2 % SOLN, Take by mouth as directed., Disp: , Rfl:    Vitamin D , Ergocalciferol , (DRISDOL ) 1.25 MG (50000 UNIT) CAPS capsule, TAKE 1 CAPSULE (50,000 UNITS TOTAL) BY MOUTH EVERY 7 (SEVEN) DAYS, Disp: 12 capsule, Rfl: 3   Allergies  Allergen Reactions   Prunus Persica Swelling    Peaches, plums, nectarines.    Cat Dander Hives and Itching   Mixed Ragweed Other (See Comments)   Pollinex-T [Modified Tree Tyrosine Adsorbate] Other (See Comments)     Review of Systems  Constitutional:  Negative for fatigue.  Respiratory: Negative.    Cardiovascular: Negative.   Endocrine: Negative for polydipsia, polyphagia and polyuria.  Neurological: Negative.   Psychiatric/Behavioral: Negative.       Today's Vitals   06/29/24 1501  BP: 120/70  Pulse: 89  Temp: 98.5 F (36.9 C)  TempSrc: Oral  Weight: 217 lb (98.4 kg)  Height: 5' 8 (1.727 m)  PainSc: 0-No pain   Body mass index is 32.99 kg/m.  Wt Readings from Last 3 Encounters:  07/04/24 215 lb 4.8 oz (97.7 kg)  06/29/24 217 lb (98.4 kg)  06/13/24 216 lb 8 oz (98.2 kg)     Objective:  Physical Exam Vitals and nursing note reviewed.  Constitutional:      General: She is not in acute distress.    Appearance: Normal appearance. She is obese.  Cardiovascular:     Rate and Rhythm: Normal rate and regular rhythm.     Pulses: Normal pulses.     Heart sounds: Normal heart sounds. No murmur heard. Pulmonary:     Effort: Pulmonary effort is normal. No respiratory distress.     Breath sounds: Normal breath sounds. No wheezing.  Musculoskeletal:     Right lower leg: Edema (lymphedema) present.     Left lower leg: Edema (lymphedema) present.   Neurological:     General: No focal deficit present.     Mental Status: She is alert and oriented to person, place, and time.     Cranial Nerves: No cranial nerve deficit.     Motor: No weakness.  Psychiatric:        Mood and Affect: Mood normal.        Behavior: Behavior normal.        Thought Content: Thought content normal.        Judgment: Judgment normal.     Assessment And Plan:  Essential hypertension Assessment &  Plan: Blood pressure is controlled, continue current medications  Orders: -     BMP8+eGFR  Type 2 diabetes mellitus with obesity Assessment & Plan: This is a new diagnosis. Type 2 diabetes managed with Jardiance . Previous A1c was 7.3. Awaiting current A1c results to assess treatment efficacy. No issues with Jardiance . Sufficient supply due to switch to three-month refills. - Check A1c level to assess glycemic control. - Consider increasing Jardiance  dose if A1c remains elevated, continue current dose until supply exhausted.  Orders: -     Hemoglobin A1c -     Microalbumin / creatinine urine ratio  Vitamin D  deficiency Assessment & Plan: Will check vitamin D  level and supplement as needed.    Also encouraged to spend 15 minutes in the sun daily.     Adult onset dermatomyositis (HCC)  Discoid lupus Assessment & Plan: Continue f/u with Rheumatology   Need for influenza vaccination Assessment & Plan: Influenza vaccine administered Encouraged to take Tylenol as needed for fever or muscle aches.   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 M spike normal. Referral to hematologist/oncologist due to potential risk for blood disorders or cancer. - Check referral status with hematologist/oncologist and ensure follow-up for further testing.     Return for keep same next.  Patient was given  opportunity to ask questions. Patient verbalized understanding of the plan and was able to repeat key elements of the plan. All questions were answered to their satisfaction.    LILLETTE [name], FNP, have reviewed all documentation for this visit. The documentation on 06/29/24 for the exam, diagnosis, procedures, and orders are all accurate and complete.   IF YOU HAVE BEEN REFERRED TO A SPECIALIST, IT MAY TAKE 1-2 WEEKS TO SCHEDULE/PROCESS THE REFERRAL. IF YOU HAVE NOT HEARD FROM US /SPECIALIST IN TWO WEEKS, PLEASE GIVE US  A CALL AT [phone].

## 2024-06-30 DIAGNOSIS — F4310 Post-traumatic stress disorder, unspecified: Secondary | ICD-10-CM | POA: Diagnosis not present

## 2024-06-30 DIAGNOSIS — F4320 Adjustment disorder, unspecified: Secondary | ICD-10-CM | POA: Diagnosis not present

## 2024-06-30 LAB — HEMOGLOBIN A1C
Est. average glucose Bld gHb Est-mCnc: 131 mg/dL
Hgb A1c MFr Bld: 6.2 % — ABNORMAL HIGH (ref 4.8–5.6)

## 2024-06-30 LAB — BMP8+EGFR
BUN/Creatinine Ratio: 27 — ABNORMAL HIGH (ref 9–23)
BUN: 12 mg/dL (ref 6–24)
CO2: 22 mmol/L (ref 20–29)
Calcium: 9.4 mg/dL (ref 8.7–10.2)
Chloride: 99 mmol/L (ref 96–106)
Creatinine, Ser: 0.44 mg/dL — ABNORMAL LOW (ref 0.57–1.00)
Glucose: 168 mg/dL — ABNORMAL HIGH (ref 70–99)
Potassium: 4 mmol/L (ref 3.5–5.2)
Sodium: 136 mmol/L (ref 134–144)
eGFR: 121 mL/min/1.73 (ref >59)

## 2024-06-30 LAB — MICROALBUMIN / CREATININE URINE RATIO
Creatinine, Urine: 13.1 mg/dL
Microalb/Creat Ratio: 31 mg/g{creat} — ABNORMAL HIGH (ref 0–29)
Microalbumin, Urine: 4 ug/mL

## 2024-07-04 ENCOUNTER — Encounter: Payer: Self-pay | Admitting: Physician Assistant

## 2024-07-04 ENCOUNTER — Ambulatory Visit: Attending: Physician Assistant | Admitting: Physician Assistant

## 2024-07-04 VITALS — BP 122/75 | HR 83 | Temp 98.2°F | Resp 14 | Ht 68.0 in | Wt 215.3 lb

## 2024-07-04 DIAGNOSIS — R600 Localized edema: Secondary | ICD-10-CM

## 2024-07-04 DIAGNOSIS — M79641 Pain in right hand: Secondary | ICD-10-CM | POA: Diagnosis not present

## 2024-07-04 DIAGNOSIS — Z79899 Other long term (current) drug therapy: Secondary | ICD-10-CM | POA: Diagnosis not present

## 2024-07-04 DIAGNOSIS — R7303 Prediabetes: Secondary | ICD-10-CM

## 2024-07-04 DIAGNOSIS — I10 Essential (primary) hypertension: Secondary | ICD-10-CM

## 2024-07-04 DIAGNOSIS — R0602 Shortness of breath: Secondary | ICD-10-CM | POA: Diagnosis not present

## 2024-07-04 DIAGNOSIS — Z8709 Personal history of other diseases of the respiratory system: Secondary | ICD-10-CM

## 2024-07-04 DIAGNOSIS — M3310 Other dermatopolymyositis, organ involvement unspecified: Secondary | ICD-10-CM

## 2024-07-04 DIAGNOSIS — R5383 Other fatigue: Secondary | ICD-10-CM | POA: Diagnosis not present

## 2024-07-04 DIAGNOSIS — Z87448 Personal history of other diseases of urinary system: Secondary | ICD-10-CM

## 2024-07-04 DIAGNOSIS — Z8639 Personal history of other endocrine, nutritional and metabolic disease: Secondary | ICD-10-CM

## 2024-07-04 DIAGNOSIS — E559 Vitamin D deficiency, unspecified: Secondary | ICD-10-CM | POA: Diagnosis not present

## 2024-07-04 DIAGNOSIS — M349 Systemic sclerosis, unspecified: Secondary | ICD-10-CM | POA: Diagnosis not present

## 2024-07-04 DIAGNOSIS — M79642 Pain in left hand: Secondary | ICD-10-CM

## 2024-07-04 DIAGNOSIS — I7300 Raynaud's syndrome without gangrene: Secondary | ICD-10-CM

## 2024-07-05 ENCOUNTER — Ambulatory Visit: Payer: Self-pay | Admitting: Rheumatology

## 2024-07-05 LAB — HEPATIC FUNCTION PANEL
AG Ratio: 1.2 (calc) (ref 1.0–2.5)
ALT: 21 U/L (ref 6–29)
AST: 27 U/L (ref 10–35)
Albumin: 4 g/dL (ref 3.6–5.1)
Alkaline phosphatase (APISO): 57 U/L (ref 31–125)
Bilirubin, Direct: 0.1 mg/dL (ref 0.0–0.2)
Globulin: 3.4 g/dL (ref 1.9–3.7)
Indirect Bilirubin: 0.3 mg/dL (ref 0.2–1.2)
Total Bilirubin: 0.4 mg/dL (ref 0.2–1.2)
Total Protein: 7.4 g/dL (ref 6.1–8.1)

## 2024-07-05 LAB — CBC WITH DIFFERENTIAL/PLATELET
Absolute Lymphocytes: 1298 {cells}/uL (ref 850–3900)
Absolute Monocytes: 603 {cells}/uL (ref 200–950)
Basophils Absolute: 31 {cells}/uL (ref 0–200)
Basophils Relative: 0.7 %
Eosinophils Absolute: 220 {cells}/uL (ref 15–500)
Eosinophils Relative: 5 %
HCT: 38.3 % (ref 35.0–45.0)
Hemoglobin: 12.9 g/dL (ref 11.7–15.5)
MCH: 31.5 pg (ref 27.0–33.0)
MCHC: 33.7 g/dL (ref 32.0–36.0)
MCV: 93.6 fL (ref 80.0–100.0)
MPV: 9.9 fL (ref 7.5–12.5)
Monocytes Relative: 13.7 %
Neutro Abs: 2248 {cells}/uL (ref 1500–7800)
Neutrophils Relative %: 51.1 %
Platelets: 261 Thousand/uL (ref 140–400)
RBC: 4.09 Million/uL (ref 3.80–5.10)
RDW: 13.2 % (ref 11.0–15.0)
Total Lymphocyte: 29.5 %
WBC: 4.4 Thousand/uL (ref 3.8–10.8)

## 2024-07-05 LAB — CK: Total CK: 378 U/L — ABNORMAL HIGH (ref 20–239)

## 2024-07-05 NOTE — Progress Notes (Signed)
 CBC and CMP are normal.  CK is improved and back to her baseline.  No change in treatment advised.

## 2024-07-06 ENCOUNTER — Ambulatory Visit: Payer: Self-pay | Admitting: Nurse Practitioner

## 2024-07-09 DIAGNOSIS — E1169 Type 2 diabetes mellitus with other specified complication: Secondary | ICD-10-CM | POA: Insufficient documentation

## 2024-07-09 DIAGNOSIS — E669 Obesity, unspecified: Secondary | ICD-10-CM | POA: Insufficient documentation

## 2024-07-09 DIAGNOSIS — Z23 Encounter for immunization: Secondary | ICD-10-CM | POA: Insufficient documentation

## 2024-07-09 NOTE — Assessment & Plan Note (Signed)
 This is a new diagnosis. Type 2 diabetes managed with Jardiance . Previous A1c was 7.3. Awaiting current A1c results to assess treatment efficacy. No issues with Jardiance . Sufficient supply due to switch to three-month refills. - Check A1c level to assess glycemic control. - Consider increasing Jardiance  dose if A1c remains elevated, continue current dose until supply exhausted.

## 2024-07-09 NOTE — Assessment & Plan Note (Signed)
 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 M spike normal. Referral to hematologist/oncologist due to potential risk for blood disorders or cancer. - Check referral status with hematologist/oncologist and ensure follow-up for further testing.

## 2024-07-09 NOTE — Assessment & Plan Note (Signed)
 Continue f/u with Rheumatology

## 2024-07-09 NOTE — Assessment & Plan Note (Signed)
 Influenza vaccine administered Encouraged to take Tylenol as needed for fever or muscle aches.

## 2024-07-09 NOTE — Assessment & Plan Note (Signed)
 Blood pressure is controlled, continue current medications

## 2024-07-09 NOTE — Assessment & Plan Note (Signed)
 Will check vitamin D  level and supplement as needed.    Also encouraged to spend 15 minutes in the sun daily.

## 2024-07-12 DIAGNOSIS — F4320 Adjustment disorder, unspecified: Secondary | ICD-10-CM | POA: Diagnosis not present

## 2024-07-12 DIAGNOSIS — F4310 Post-traumatic stress disorder, unspecified: Secondary | ICD-10-CM | POA: Diagnosis not present

## 2024-07-18 ENCOUNTER — Ambulatory Visit (HOSPITAL_COMMUNITY)
Admission: RE | Admit: 2024-07-18 | Discharge: 2024-07-18 | Disposition: A | Discharge status: Home / Self Care | Source: Ambulatory Visit | Attending: Student in an Organized Health Care Education/Training Program | Admitting: Student in an Organized Health Care Education/Training Program

## 2024-07-18 DIAGNOSIS — R0602 Shortness of breath: Secondary | ICD-10-CM | POA: Insufficient documentation

## 2024-07-18 DIAGNOSIS — R9431 Abnormal electrocardiogram [ECG] [EKG]: Secondary | ICD-10-CM | POA: Insufficient documentation

## 2024-07-18 LAB — ECHOCARDIOGRAM COMPLETE
Area-P 1/2: 4.96 cm2
S' Lateral: 2.9 cm

## 2024-07-18 NOTE — Progress Notes (Signed)
Essentially normal echocardiogram with minor abnormalities.

## 2024-08-08 DIAGNOSIS — F4320 Adjustment disorder, unspecified: Secondary | ICD-10-CM | POA: Diagnosis not present

## 2024-08-08 DIAGNOSIS — F4310 Post-traumatic stress disorder, unspecified: Secondary | ICD-10-CM | POA: Diagnosis not present

## 2024-08-10 DIAGNOSIS — E559 Vitamin D deficiency, unspecified: Secondary | ICD-10-CM | POA: Diagnosis not present

## 2024-08-10 DIAGNOSIS — I10 Essential (primary) hypertension: Secondary | ICD-10-CM | POA: Diagnosis not present

## 2024-08-10 DIAGNOSIS — R809 Proteinuria, unspecified: Secondary | ICD-10-CM | POA: Diagnosis not present

## 2024-08-10 DIAGNOSIS — L930 Discoid lupus erythematosus: Secondary | ICD-10-CM | POA: Diagnosis not present

## 2024-08-15 ENCOUNTER — Ambulatory Visit: Attending: Physician Assistant | Admitting: Physician Assistant

## 2024-08-15 ENCOUNTER — Encounter: Payer: Self-pay | Admitting: Physician Assistant

## 2024-08-15 VITALS — BP 122/70 | HR 84 | Ht 68.0 in | Wt 210.2 lb

## 2024-08-15 DIAGNOSIS — M3310 Other dermatopolymyositis, organ involvement unspecified: Secondary | ICD-10-CM | POA: Diagnosis not present

## 2024-08-15 DIAGNOSIS — R0602 Shortness of breath: Secondary | ICD-10-CM

## 2024-08-15 DIAGNOSIS — I890 Lymphedema, not elsewhere classified: Secondary | ICD-10-CM

## 2024-08-15 DIAGNOSIS — M349 Systemic sclerosis, unspecified: Secondary | ICD-10-CM

## 2024-08-15 NOTE — Assessment & Plan Note (Signed)
 Chronic issue. She has pneumatic compression device she uses at home.

## 2024-08-15 NOTE — Patient Instructions (Signed)
 Medication Instructions:  Your physician recommends that you continue on your current medications as directed. Please refer to the Current Medication list given to you today.  *If you need a refill on your cardiac medications before your next appointment, please call your pharmacy*  Lab Work: Complete current order for Fasting Lipid Panel at least 6-8 hours after last meal.  If you have labs (blood work) drawn today and your tests are completely normal, you will receive your results only by: MyChart Message (if you have MyChart) OR A paper copy in the mail If you have any lab test that is abnormal or we need to change your treatment, we will call you to review the results.  Testing/Procedures: None ordered  Follow-Up: At [facility], you and your health needs are our priority.  As part of our continuing mission to provide you with exceptional heart care, our providers are all part of one team.  This team includes your primary Cardiologist (physician) and Advanced Practice Providers or APPs (Physician Assistants and Nurse Practitioners) who all work together to provide you with the care you need, when you need it.  Your next appointment:   1 year(s)  Provider:   Gordy Bergamo, MD    We recommend signing up for the patient portal called MyChart.  Sign up information is provided on this After Visit Summary.  MyChart is used to connect with patients for Virtual Visits (Telemedicine).  Patients are able to view lab/test results, encounter notes, upcoming appointments, etc.  Non-urgent messages can be sent to your provider as well.   To learn more about what you can do with MyChart, go to [email or web address].

## 2024-08-15 NOTE — Assessment & Plan Note (Signed)
 Followed by rheumatology. Given increased risk of cardiac involvement with connective tissue disorder, will arrange routine cardiology follow up in 1 year.

## 2024-08-15 NOTE — Progress Notes (Signed)
    OFFICE NOTE:    Date:  08/15/2024  ID:  Patricia Krause, DOB 06/24/1976, MRN [id number] PCP: Georgina Speaks, FNP  [facility] Providers Cardiologist:  Gordy Bergamo, MD        Shortness of breath  SPECT MPI 06/27/24: Low Risk - no ischemia or infarction, EF 71, no TID, no CAC  TTE 07/18/24: EF 60-65, no RWMA, mild LVH, mild LAE, trivial pericardial effusion  Right Bundle Branch Block  Hypertension  Diabetes mellitus type 2  Hyperlipidemia  Statins avoided due to dermatomyositis  Raynaud's disease Dermatomyositis   Scleroderma  Lymphedema   Asthma        Discussed the use of AI scribe software for clinical note transcription with the patient, who gave verbal consent to proceed. History of Present Illness Patricia Krause is a 48 y.o. female for follow up of shortness of breath. She is a prior pt of Dr. Dann. She saw Dr. Ganji 06/13/24 for evaluation of dyspnea. Pt had seen Pulmonology and shortness of breath was felt to be out of proportion to mild asthma. EKG demonstrated inf and ant-lat TW changes. Echocardiogram was arranged and demonstrated normal LVF and RVF. There was no evidence of pulmonary hypertension. Nuclear stress test was neg for ischemia. There was no transient ischemic dilatation and there was no coronary artery calcium noted on attenuation correction images.   She experiences shortness of breath, particularly during exertion. No chest pain, syncope, or significant issues with her asthma. She has lymphedema and uses a leg compression pump daily.     ROS-See HPI     Studies Reviewed:       Labs  03/14/24: TC 155, Trig 212, HDL 35, LDL 84 06/29/24: SCr 0.44, K 4, Hgb A1c 6.2 07/04/24: Hgb 12.9, ALT 21         Physical Exam:  VS:  BP 122/70   Pulse 84   Ht 5' 8 (1.727 m)   Wt 210 lb 3.2 oz (95.3 kg)   SpO2 96%   BMI 31.96 kg/m        Wt Readings from Last 3 Encounters:  08/15/24 210 lb 3.2 oz (95.3 kg)  07/04/24 215 lb 4.8 oz (97.7 kg)   06/29/24 217 lb (98.4 kg)    Constitutional:      Appearance: Healthy appearance. Not in distress.  Pulmonary:     Breath sounds: No wheezing. No rales.  Cardiovascular:     PMI at left midclavicular line. Regular rhythm.     Murmurs: There is no murmur.  Edema:    Peripheral edema (massive lymphedema bilat lower ext) present.      Assessment and Plan:    Assessment & Plan Shortness of breath Recent Pharmacologic SPECT MPI low risk. CAC was absent on attenuation correction CT. Recent echocardiogram demonstrated normal EF and no evidence of pulmonary hypertension. No further testing needed. Lymphedema Chronic issue. She has pneumatic compression device she uses at home. Scleroderma (HCC) Adult onset dermatomyositis (HCC) Followed by rheumatology. Given increased risk of cardiac involvement with connective tissue disorder, will arrange routine cardiology follow up in 1 year.        Dispo:  Return in about 1 year (around 08/15/2025) for Routine Follow Up, w/ Dr. Bergamo.  Signed, Glendia Ferrier, PA-C

## 2024-08-18 ENCOUNTER — Telehealth: Payer: Self-pay

## 2024-08-18 NOTE — Telephone Encounter (Signed)
 Copied from CRM #[id number]. Topic: Clinical - Medical Advice >> Aug 18, 2024  2:12 PM Kevelyn M wrote: They are not sure what she needs to be seen for because her spep test was normal. There is no reason for them to see the patient. Is there another reason she needs to be seen by them or should they close out the referral?  Call back #[phone]

## 2024-08-21 ENCOUNTER — Encounter: Payer: Self-pay | Admitting: Cardiology

## 2024-08-21 DIAGNOSIS — F4310 Post-traumatic stress disorder, unspecified: Secondary | ICD-10-CM | POA: Diagnosis not present

## 2024-08-21 DIAGNOSIS — F4320 Adjustment disorder, unspecified: Secondary | ICD-10-CM | POA: Diagnosis not present

## 2024-08-22 ENCOUNTER — Other Ambulatory Visit: Payer: Self-pay | Admitting: Nurse Practitioner

## 2024-08-22 DIAGNOSIS — E782 Mixed hyperlipidemia: Secondary | ICD-10-CM | POA: Diagnosis not present

## 2024-08-23 LAB — LIPID PANEL
Chol/HDL Ratio: 3.5 ratio (ref 0.0–4.4)
Cholesterol, Total: 138 mg/dL (ref 100–199)
HDL: 40 mg/dL (ref >39)
LDL Chol Calc (NIH): 71 mg/dL (ref 0–99)
Triglycerides: 155 mg/dL — ABNORMAL HIGH (ref 0–149)
VLDL Cholesterol Cal: 27 mg/dL (ref 5–40)

## 2024-08-24 ENCOUNTER — Other Ambulatory Visit: Payer: Self-pay

## 2024-08-24 ENCOUNTER — Encounter: Payer: Self-pay | Admitting: Nurse Practitioner

## 2024-08-24 MED ORDER — FLUCONAZOLE 100 MG PO TABS: 100.0000 mg | ORAL_TABLET | Freq: Every day | ORAL | 0 refills | Status: DC

## 2024-08-24 MED ORDER — HYDROXYZINE HCL 25 MG PO TABS: 25.0000 mg | ORAL_TABLET | Freq: Three times a day (TID) | ORAL | 1 refills | Status: AC | PRN

## 2024-08-24 MED ORDER — METHOTREXATE SODIUM 2.5 MG PO TABS: ORAL_TABLET | ORAL | 0 refills | Status: DC

## 2024-08-24 NOTE — Progress Notes (Signed)
 Excellent control of cholesterol and also triglycerides. Continue present meds

## 2024-08-28 ENCOUNTER — Other Ambulatory Visit: Payer: Self-pay | Admitting: Nurse Practitioner

## 2024-08-28 ENCOUNTER — Other Ambulatory Visit (HOSPITAL_COMMUNITY): Payer: Self-pay

## 2024-08-28 DIAGNOSIS — R21 Rash and other nonspecific skin eruption: Secondary | ICD-10-CM

## 2024-08-28 MED ORDER — NYSTATIN 100000 UNIT/GM EX CREA: 1.0000 | TOPICAL_CREAM | Freq: Two times a day (BID) | CUTANEOUS | 1 refills | Status: AC

## 2024-09-04 DIAGNOSIS — F411 Generalized anxiety disorder: Secondary | ICD-10-CM | POA: Diagnosis not present

## 2024-09-11 DIAGNOSIS — F411 Generalized anxiety disorder: Secondary | ICD-10-CM | POA: Diagnosis not present

## 2024-09-11 DIAGNOSIS — F331 Major depressive disorder, recurrent, moderate: Secondary | ICD-10-CM | POA: Diagnosis not present

## 2024-09-14 ENCOUNTER — Ambulatory Visit: Payer: Self-pay | Admitting: Nurse Practitioner

## 2024-09-14 ENCOUNTER — Encounter: Payer: Self-pay | Admitting: Nurse Practitioner

## 2024-09-14 VITALS — BP 110/70 | HR 85 | Temp 97.6°F | Ht 68.0 in | Wt 214.0 lb

## 2024-09-14 DIAGNOSIS — E669 Obesity, unspecified: Secondary | ICD-10-CM

## 2024-09-14 DIAGNOSIS — E559 Vitamin D deficiency, unspecified: Secondary | ICD-10-CM | POA: Diagnosis not present

## 2024-09-14 DIAGNOSIS — I10 Essential (primary) hypertension: Secondary | ICD-10-CM | POA: Diagnosis not present

## 2024-09-14 DIAGNOSIS — Z0000 Encounter for general adult medical examination without abnormal findings: Secondary | ICD-10-CM | POA: Diagnosis not present

## 2024-09-14 DIAGNOSIS — Z79899 Other long term (current) drug therapy: Secondary | ICD-10-CM | POA: Diagnosis not present

## 2024-09-14 DIAGNOSIS — E786 Lipoprotein deficiency: Secondary | ICD-10-CM

## 2024-09-14 DIAGNOSIS — E119 Type 2 diabetes mellitus without complications: Secondary | ICD-10-CM | POA: Diagnosis not present

## 2024-09-14 DIAGNOSIS — E1169 Type 2 diabetes mellitus with other specified complication: Secondary | ICD-10-CM | POA: Diagnosis not present

## 2024-09-14 LAB — POCT URINALYSIS DIP (CLINITEK)
Bilirubin, UA: NEGATIVE
Blood, UA: NEGATIVE
Glucose, UA: NEGATIVE mg/dL
Ketones, POC UA: NEGATIVE mg/dL
Leukocytes, UA: NEGATIVE
Nitrite, UA: NEGATIVE
POC PROTEIN,UA: NEGATIVE
Spec Grav, UA: 1.01 (ref 1.010–1.025)
Urobilinogen, UA: 0.2 U/dL
pH, UA: 5.5 (ref 5.0–8.0)

## 2024-09-14 NOTE — Progress Notes (Signed)
 [name], CMA,acting as a neurosurgeon for Patricia Ada, FNP.,have documented all relevant documentation on the behalf of Patricia Ada, FNP,as directed by  Patricia Ada, FNP while in the presence of Patricia Ada, FNP.  Subjective:    Patient ID: Patricia Krause , female    DOB: [birth year] , 48 y.o.   MRN: [id number]  Chief Complaint  Patient presents with   Annual Exam    Patient presents today for HM, Patient reports compliance with medication. Patient denies any chest pain, SOB, or headaches. Patient has no concerns today.     HPI  Discussed the use of AI scribe software for clinical note transcription with the patient, who gave verbal consent to proceed.  History of Present Illness [name] is a 48 year old female who presents for an annual physical exam.  She has consulted with a pulmonologist, cardiologist, and nephrologist since her last visit. An abnormal EKG led to a stress test and echocardiogram, both of which were normal. A urinalysis and kidney ultrasound showed no significant issues, although she has had intermittent proteinuria in the past.  She reports a history of risk for pulmonary hypertension and cancer. There have been no recent concerning findings from her specialists.  She continues to use an IUD and experiences minimal menstrual bleeding, describing it as 'barely anything' and not requiring a pad. She no longer experiences cramps.  She is attempting to improve her diet by reducing junk food and carbohydrates, aiming for lifestyle changes rather than a restrictive diet. She exercises for about ten minutes daily, Monday through Friday, primarily at home.  She has not had an eye exam since her diabetes diagnosis and requires a referral to an eye specialist. She last visited an eye care provider in March 2024.  She experiences occasional constipation and diarrhea, which she manages by adjusting her diet, such as eating beans to alleviate constipation.  She  reports being told by another provider that she has a hernia, which she describes as soft and reducible.  She uses a device at home for her legs daily, except when traveling, and her wife has noticed some improvement, though she feels it looks the same.  She has automated medication refills and is managing her prescriptions through an app.  Past Medical History:  Diagnosis Date   Allergy    Anxiety    Asthma    Depression    Dermatolysis    GERD (gastroesophageal reflux disease)    Hypertension    Lymphedema    dx by vascular doctor   Neuromuscular disorder (HCC)    Pre-diabetes      Family History  Problem Relation Age of Onset   Diabetes Father    Cancer Father        prostate cancer in her 66s.   Cancer Paternal Aunt        uncertain what type of cancer   Diabetes Paternal Grandmother    Alzheimer's disease Paternal Grandfather    High Cholesterol Brother    Diabetes Son    Autism Son    Intellectual disability Son    Learning disabilities Son    Colon cancer Neg Hx    Breast cancer Neg Hx      Current Outpatient Medications:    albuterol  (VENTOLIN  HFA) 108 (90 Base) MCG/ACT inhaler, Inhale 2 puffs into the lungs every 6 (six) hours as needed for wheezing or shortness of breath., Disp: 8 g, Rfl: 2   amLODipine  (NORVASC ) 5 MG  tablet, TAKE 1 TABLET (5 MG TOTAL) BY MOUTH DAILY., Disp: 90 tablet, Rfl: 1   ezetimibe  (ZETIA ) 10 MG tablet, Take 1 tablet (10 mg total) by mouth daily., Disp: 90 tablet, Rfl: 1   fluconazole  (DIFLUCAN ) 100 MG tablet, Take 1 tablet (100 mg total) by mouth daily. Take 1 tablet by mouth now repeat in 5 days, Disp: 2 tablet, Rfl: 0   folic acid  (FOLVITE ) 1 MG tablet, TAKE 2 TABLETS(2 MG) BY MOUTH DAILY, Disp: 180 tablet, Rfl: 3   hydrOXYzine  (ATARAX ) 25 MG tablet, Take 1 tablet (25 mg total) by mouth 3 (three) times daily as needed., Disp: 270 tablet, Rfl: 1   JARDIANCE  10 MG TABS tablet, TAKE 1 TABLET BY MOUTH DAILY BEFORE BREAKFAST., Disp: 90  tablet, Rfl: 1   methotrexate  (RHEUMATREX) 2.5 MG tablet, TAKE 8 TABLETS BY MOUTH ONE TIME PER WEEK, Disp: 96 tablet, Rfl: 0   mupirocin  ointment (BACTROBAN ) 2 %, Apply 1 Application topically 2 (two) times daily. (Patient taking differently: Apply 1 Application topically 2 (two) times daily. PRN), Disp: 30 g, Rfl: 3   nystatin  cream (MYCOSTATIN ), Apply 1 Application topically 2 (two) times daily., Disp: 30 g, Rfl: 1   SODIUM FLUORIDE, DENTAL RINSE, 0.2 % SOLN, Take by mouth as directed., Disp: , Rfl:    Vitamin D , Ergocalciferol , (DRISDOL ) 1.25 MG (50000 UNIT) CAPS capsule, TAKE 1 CAPSULE (50,000 UNITS TOTAL) BY MOUTH EVERY 7 (SEVEN) DAYS, Disp: 12 capsule, Rfl: 3   Allergies  Allergen Reactions   Prunus Persica Swelling    Peaches, plums, nectarines.    Cat Dander Hives and Itching   Mixed Ragweed Other (See Comments)   Pollinex-T [Modified Tree Tyrosine Adsorbate] Other (See Comments)      The patient states she uses IUD for birth control. No LMP recorded. (Menstrual status: IUD).  Negative for: breast discharge, breast lump(s), breast pain and breast self exam. Associated symptoms include abnormal vaginal bleeding. Pertinent negatives include abnormal bleeding (hematology), anxiety, decreased libido, depression, difficulty falling sleep, dyspareunia, history of infertility, nocturia, sexual dysfunction, sleep disturbances, urinary incontinence, urinary urgency, vaginal discharge and vaginal itching. Diet regular; trying to cut back on certain foods. She is focusing on a lifestyle change. The patient states her exercise level is minimal - 10 minutes a day - Monday through Friday. She does [employer].   The patient's tobacco use is:  Social History   Tobacco Use  Smoking Status Never   Passive exposure: Never  Smokeless Tobacco Never   She has been exposed to passive smoke. The patient's alcohol use is:  Social History   Substance and Sexual Activity  Alcohol Use Yes   Comment:  1-2 drinks/months   Additional information: Last pap 08/20/2022, next one scheduled for 08/20/2025.    Review of Systems  Constitutional: Negative.   HENT: Negative.    Eyes: Negative.   Respiratory: Negative.    Cardiovascular:  Positive for leg swelling (lymphedema).  Gastrointestinal: Negative.   Endocrine: Negative.   Genitourinary: Negative.   Musculoskeletal: Negative.   Skin: Negative.   Allergic/Immunologic: Negative.   Neurological: Negative.   Hematological: Negative.   Psychiatric/Behavioral: Negative.       Today's Vitals   09/14/24 1413  BP: 110/70  Pulse: 85  Temp: 97.6 F (36.4 C)  TempSrc: Oral  Weight: 214 lb (97.1 kg)  Height: 5' 8 (1.727 m)  PainSc: 0-No pain   Body mass index is 32.54 kg/m.  Wt Readings from Last 3 Encounters:  09/14/24  214 lb (97.1 kg)  08/15/24 210 lb 3.2 oz (95.3 kg)  07/04/24 215 lb 4.8 oz (97.7 kg)     Objective:  Physical Exam Vitals and nursing note reviewed.  Constitutional:      General: She is not in acute distress.    Appearance: Normal appearance. She is well-developed. She is obese.  HENT:     Head: Normocephalic and atraumatic.     Right Ear: Hearing, tympanic membrane, ear canal and external ear normal. There is no impacted cerumen.     Left Ear: Hearing, tympanic membrane, ear canal and external ear normal. There is no impacted cerumen.     Nose: Nose normal.     Mouth/Throat:     Mouth: Mucous membranes are moist.  Eyes:     General: Lids are normal.     Extraocular Movements: Extraocular movements intact.     Conjunctiva/sclera: Conjunctivae normal.     Pupils: Pupils are equal, round, and reactive to light.     Funduscopic exam:    Right eye: No papilledema.        Left eye: No papilledema.  Neck:     Thyroid : No thyroid  mass.     Vascular: No carotid bruit.  Cardiovascular:     Rate and Rhythm: Normal rate and regular rhythm.     Pulses: Normal pulses.     Heart sounds: Normal heart sounds. No  murmur heard. Pulmonary:     Effort: Pulmonary effort is normal. No respiratory distress.     Breath sounds: Normal breath sounds. No wheezing.  Chest:     Chest wall: No mass.  Breasts:    Tanner Score is 5.     Right: Normal. No mass or tenderness.     Left: Normal. No mass or tenderness.  Abdominal:     General: Abdomen is flat. Bowel sounds are normal. There is no distension.     Palpations: Abdomen is soft.     Tenderness: There is no abdominal tenderness.  Genitourinary:    Rectum: Guaiac result negative.  Musculoskeletal:        General: No swelling. Normal range of motion.     Cervical back: Full passive range of motion without pain, normal range of motion and neck supple.     Right lower leg: Edema (lymphedema) present.     Left lower leg: Edema (lymphedema) present.  Lymphadenopathy:     Upper Body:     Right upper body: No supraclavicular, axillary or pectoral adenopathy.     Left upper body: No supraclavicular, axillary or pectoral adenopathy.  Skin:    General: Skin is warm and dry.     Capillary Refill: Capillary refill takes less than 2 seconds.  Neurological:     General: No focal deficit present.     Mental Status: She is alert and oriented to person, place, and time.     Cranial Nerves: No cranial nerve deficit.     Sensory: No sensory deficit.     Motor: No weakness.  Psychiatric:        Mood and Affect: Mood normal.        Behavior: Behavior normal.        Thought Content: Thought content normal.        Judgment: Judgment normal.     Assessment And Plan:     Encounter for annual health examination Assessment & Plan: Routine examination. Discussed lifestyle modifications. - Referred to eye specialist for diabetic retinopathy screening. - Continue  regular dental visits. - Encouraged lifestyle modifications including healthy diet and regular exercise.   Essential hypertension Assessment & Plan: Blood pressure management ongoing with current  medication regimen.  Orders: -     POCT URINALYSIS DIP (CLINITEK) -     Microalbumin / creatinine urine ratio -     CMP14+EGFR  Type 2 diabetes mellitus in patient with obesity [facility]) Assessment & Plan: Diabetes management ongoing with current medication regimen. - Referred to eye specialist for diabetic retinopathy screening.  Orders: -     Hemoglobin A1c -     Ambulatory referral to Ophthalmology  Vitamin D  deficiency Assessment & Plan: Will check vitamin D  level and supplement as needed.    Also encouraged to spend 15 minutes in the sun daily.     Other long term (current) drug therapy -     CBC with Differential/Platelet  Low HDL (under 40) -     Lipid panel     Return for 1 year physical, controlled DM check 4 months. Patient was given opportunity to ask questions. Patient verbalized understanding of the plan and was able to repeat key elements of the plan. All questions were answered to their satisfaction.   Patricia Ada, FNP  I, Patricia Ada, FNP, have reviewed all documentation for this visit. The documentation on 09/14/2024 for the exam, diagnosis, procedures, and orders are all accurate and complete.

## 2024-09-15 LAB — CBC WITH DIFFERENTIAL/PLATELET
Basophils Absolute: 0 x10E3/uL (ref 0.0–0.2)
Basos: 1 %
EOS (ABSOLUTE): 0.2 x10E3/uL (ref 0.0–0.4)
Eos: 4 %
Hematocrit: 42.2 % (ref 34.0–46.6)
Hemoglobin: 14.3 g/dL (ref 11.1–15.9)
Immature Grans (Abs): 0 x10E3/uL (ref 0.0–0.1)
Immature Granulocytes: 0 %
Lymphocytes Absolute: 2.1 x10E3/uL (ref 0.7–3.1)
Lymphs: 33 %
MCH: 32.7 pg (ref 26.6–33.0)
MCHC: 33.9 g/dL (ref 31.5–35.7)
MCV: 97 fL (ref 79–97)
Monocytes Absolute: 0.6 x10E3/uL (ref 0.1–0.9)
Monocytes: 10 %
Neutrophils Absolute: 3.3 x10E3/uL (ref 1.4–7.0)
Neutrophils: 51 %
Platelets: 290 x10E3/uL (ref 150–450)
RBC: 4.37 x10E6/uL (ref 3.77–5.28)
RDW: 13 % (ref 11.7–15.4)
WBC: 6.2 x10E3/uL (ref 3.4–10.8)

## 2024-09-15 LAB — CMP14+EGFR
ALT: 26 IU/L (ref 0–32)
AST: 31 IU/L (ref 0–40)
Albumin: 4.5 g/dL (ref 3.9–4.9)
Alkaline Phosphatase: 70 IU/L (ref 41–116)
BUN/Creatinine Ratio: 20 (ref 9–23)
BUN: 10 mg/dL (ref 6–24)
Bilirubin Total: 0.5 mg/dL (ref 0.0–1.2)
CO2: 25 mmol/L (ref 20–29)
Calcium: 10 mg/dL (ref 8.7–10.2)
Chloride: 98 mmol/L (ref 96–106)
Creatinine, Ser: 0.5 mg/dL — ABNORMAL LOW (ref 0.57–1.00)
Globulin, Total: 3.7 g/dL (ref 1.5–4.5)
Glucose: 189 mg/dL — ABNORMAL HIGH (ref 70–99)
Potassium: 4 mmol/L (ref 3.5–5.2)
Sodium: 138 mmol/L (ref 134–144)
Total Protein: 8.2 g/dL (ref 6.0–8.5)
eGFR: 117 mL/min/1.73 (ref >59)

## 2024-09-15 LAB — HEMOGLOBIN A1C
Est. average glucose Bld gHb Est-mCnc: 143 mg/dL
Hgb A1c MFr Bld: 6.6 % — ABNORMAL HIGH (ref 4.8–5.6)

## 2024-09-15 LAB — LIPID PANEL
Chol/HDL Ratio: 3.7 ratio (ref 0.0–4.4)
Cholesterol, Total: 146 mg/dL (ref 100–199)
HDL: 39 mg/dL — ABNORMAL LOW (ref >39)
LDL Chol Calc (NIH): 77 mg/dL (ref 0–99)
Triglycerides: 178 mg/dL — ABNORMAL HIGH (ref 0–149)
VLDL Cholesterol Cal: 30 mg/dL (ref 5–40)

## 2024-09-15 LAB — MICROALBUMIN / CREATININE URINE RATIO
Creatinine, Urine: 33.7 mg/dL
Microalb/Creat Ratio: 46 mg/g{creat} — ABNORMAL HIGH (ref 0–29)
Microalbumin, Urine: 15.4 ug/mL

## 2024-09-24 ENCOUNTER — Ambulatory Visit: Payer: Self-pay | Admitting: Nurse Practitioner

## 2024-09-24 DIAGNOSIS — Z0000 Encounter for general adult medical examination without abnormal findings: Secondary | ICD-10-CM | POA: Insufficient documentation

## 2024-09-24 NOTE — Assessment & Plan Note (Signed)
 Blood pressure management ongoing with current medication regimen.

## 2024-09-24 NOTE — Assessment & Plan Note (Signed)
 Will check vitamin D  level and supplement as needed.    Also encouraged to spend 15 minutes in the sun daily.

## 2024-09-24 NOTE — Assessment & Plan Note (Signed)
 Routine examination. Discussed lifestyle modifications. - Referred to eye specialist for diabetic retinopathy screening. - Continue regular dental visits. - Encouraged lifestyle modifications including healthy diet and regular exercise.

## 2024-09-24 NOTE — Assessment & Plan Note (Signed)
 Diabetes management ongoing with current medication regimen. - Referred to eye specialist for diabetic retinopathy screening.

## 2024-09-25 DIAGNOSIS — F411 Generalized anxiety disorder: Secondary | ICD-10-CM | POA: Diagnosis not present

## 2024-09-25 DIAGNOSIS — F331 Major depressive disorder, recurrent, moderate: Secondary | ICD-10-CM | POA: Diagnosis not present

## 2024-10-02 ENCOUNTER — Encounter: Payer: Self-pay | Admitting: Nurse Practitioner

## 2024-10-02 NOTE — Telephone Encounter (Signed)
 Called patient and advised to go to UC due to it being reoccurring. Provider out of office all week no open spots.

## 2024-10-03 ENCOUNTER — Other Ambulatory Visit: Payer: Self-pay

## 2024-10-03 ENCOUNTER — Ambulatory Visit
Admission: RE | Admit: 2024-10-03 | Discharge: 2024-10-03 | Disposition: A | Discharge status: Home / Self Care | Payer: Self-pay | Source: Ambulatory Visit

## 2024-10-03 VITALS — BP 131/77 | HR 86 | Temp 98.2°F | Resp 18

## 2024-10-03 DIAGNOSIS — B3731 Acute candidiasis of vulva and vagina: Secondary | ICD-10-CM | POA: Diagnosis not present

## 2024-10-03 MED ORDER — FLUCONAZOLE 200 MG PO TABS: ORAL_TABLET | ORAL | 1 refills | Status: AC

## 2024-10-03 MED ORDER — TERCONAZOLE 0.8 % VA CREA: 1.0000 | TOPICAL_CREAM | Freq: Every day | VAGINAL | 0 refills | Status: AC

## 2024-10-03 NOTE — ED Triage Notes (Signed)
 Patient reports she has had an issue with vaginal itching for a month.  Patient has a history of this, noted in August 2025.  Reports pcp gave a couple of medicines for a yeast infection and symptoms did go away

## 2024-10-03 NOTE — ED Notes (Signed)
Patient remains in bathroom.

## 2024-10-03 NOTE — ED Provider Notes (Signed)
 " [facility]    CSN: [id number] Arrival date & time: 10/03/24  1353      History   Chief Complaint Chief Complaint  Patient presents with   Vaginal Itching    HPI Patricia Krause is a 48 y.o. female.   Pt presents today due to vaginal itching for the past month. Pt states that she has had yeast infections in the past and this feels similar. Pt denies concern for STI, dysuria, lower abdominal pain, or back pain.   The history is provided by the patient.  Vaginal Itching    Past Medical History:  Diagnosis Date   Allergy    Anxiety    Asthma    Depression    Dermatolysis    GERD (gastroesophageal reflux disease)    Hypertension    Lymphedema    dx by vascular doctor   Neuromuscular disorder (HCC)    Pre-diabetes     Patient Active Problem List   Diagnosis Date Noted   Encounter for annual health examination 09/24/2024   Need for influenza vaccination 07/09/2024   Type 2 diabetes mellitus in patient with obesity (HCC) 07/09/2024   Abnormal gamma globulin level 06/08/2024   Rash 06/08/2024   Need for pneumococcal 20-valent conjugate vaccination 03/19/2024   Class 1 obesity with body mass index (BMI) of 33.0 to 33.9 in adult 03/19/2024   Proteinuria 03/19/2024   Encounter for general health examination 09/14/2023   Scleroderma (HCC) 06/15/2023   Mild intermittent asthma without complication 06/15/2023   Low HDL (under 40) 02/25/2023   Lymphedema 02/25/2023   Class 1 obesity due to excess calories without serious comorbidity with body mass index (BMI) of 31.0 to 31.9 in adult 02/25/2023   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   Raynaud's syndrome without gangrene 12/02/2021   History of asthma 11/06/2021   Vitamin D  deficiency 11/06/2021   Essential hypertension 11/06/2021   Scarring alopecia 08/18/2016   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 Authorizing Provider  fluconazole  (DIFLUCAN ) 200 MG tablet Take 1 tab po every 3 days 10/03/24  Yes Andra Krabbe C, PA-C  terconazole  (TERAZOL 3 ) 0.8 % vaginal cream Place 1 applicator vaginally at bedtime. 10/03/24  Yes Andra Krabbe JAYSON, PA-C  albuterol  (VENTOLIN  HFA) 108 (90 Base) MCG/ACT inhaler Inhale 2 puffs into the lungs every 6 (six) hours as needed for wheezing or shortness of breath. 12/15/21   Georgina Speaks, FNP  amLODipine  (NORVASC ) 5 MG tablet TAKE 1 TABLET (5 MG TOTAL) BY MOUTH DAILY. 05/22/24   Georgina Speaks, FNP  ezetimibe  (ZETIA ) 10 MG tablet Take 1 tablet (10 mg total) by mouth daily. 06/13/24 09/14/24  Ladona Heinz, MD  folic acid  (FOLVITE ) 1 MG tablet  TAKE 2 TABLETS(2 MG) BY MOUTH DAILY 06/27/24   Moore, Janece, FNP  hydrOXYzine  (ATARAX ) 25 MG tablet Take 1 tablet (25 mg total) by mouth 3 (three) times daily as needed. 08/24/24   Georgina Speaks, FNP  JARDIANCE  10 MG TABS tablet TAKE 1 TABLET BY MOUTH DAILY BEFORE BREAKFAST. 04/26/24   Georgina Speaks, FNP  methotrexate  (RHEUMATREX) 2.5 MG tablet TAKE 8 TABLETS BY MOUTH ONE TIME PER WEEK 08/24/24   Moore, Janece, FNP  mupirocin  ointment (BACTROBAN ) 2 % Apply 1 Application topically 2 (two) times daily. Patient taking differently: Apply 1 Application topically 2 (two) times daily. PRN 12/06/23   Moore, Janece, FNP  nystatin  cream (MYCOSTATIN ) Apply 1 Application topically 2 (two) times daily. 08/28/24   Georgina Speaks, FNP  SODIUM FLUORIDE, DENTAL RINSE, 0.2 % SOLN Take by mouth as directed. 02/23/24   [provider]  Vitamin D , Ergocalciferol , (DRISDOL ) 1.25 MG (50000 UNIT) CAPS capsule  TAKE 1 CAPSULE (50,000 UNITS TOTAL) BY MOUTH EVERY 7 (SEVEN) DAYS 02/23/24   Georgina Speaks, FNP    Family History Family History  Problem Relation Age of Onset   Diabetes Father    Cancer Father        prostate cancer in her 52s.   Cancer Paternal Aunt        uncertain what type of cancer   Diabetes Paternal Grandmother    Alzheimer's disease Paternal Grandfather    High Cholesterol Brother    Diabetes Son    Autism Son    Intellectual disability Son    Learning disabilities Son    Colon cancer Neg Hx    Breast cancer Neg Hx     Social History Social History[1]   Allergies   Prunus persica, Cat dander, Mixed ragweed, and Pollinex-t [modified tree tyrosine adsorbate]   Review of Systems Review of Systems   Physical Exam Triage Vital Signs ED Triage Vitals  Encounter Vitals Group     BP 10/03/24 1406 131/77     Girls Systolic BP Percentile --      Girls Diastolic BP Percentile --      Boys Systolic BP Percentile --      Boys Diastolic BP Percentile --      Pulse Rate 10/03/24 1406 86     Resp 10/03/24 1406 18     Temp 10/03/24 1406 98.2 F (36.8 C)     Temp Source 10/03/24 1406 Oral     SpO2 10/03/24 1406 95 %     Weight --      Height --      Head Circumference --      Peak Flow --      Pain Score 10/03/24 1430 0     Pain Loc --      Pain Education --      Exclude from Growth Chart --    No data found.  Updated Vital Signs BP 131/77 (BP Location: Left Arm)   Pulse 86   Temp 98.2 F (36.8 C) (Oral)   Resp 18   SpO2 95%   Visual Acuity Right Eye Distance:   Left Eye Distance:   Bilateral Distance:    Right Eye Near:   Left Eye Near:    Bilateral Near:     Physical Exam Vitals and nursing note reviewed.  Constitutional:      General: She is not in acute distress.    Appearance: Normal appearance. She is not ill-appearing, toxic-appearing or diaphoretic.  Eyes:  General: No scleral icterus. Cardiovascular:     Rate and Rhythm: Normal  rate and regular rhythm.     Heart sounds: Normal heart sounds.  Pulmonary:     Effort: Pulmonary effort is normal. No respiratory distress.     Breath sounds: Normal breath sounds. No wheezing or rhonchi.  Skin:    General: Skin is warm.  Neurological:     Mental Status: She is alert and oriented to person, place, and time.  Psychiatric:        Mood and Affect: Mood normal.        Behavior: Behavior normal.      UC Treatments / Results  Labs (all labs ordered are listed, but only abnormal results are displayed) Labs Reviewed - No data to display  EKG   Radiology No results found.  Procedures Procedures (including critical care time)  Medications Ordered in UC Medications - No data to display  Initial Impression / Assessment and Plan / UC Course  I have reviewed the triage vital signs and the nursing notes.  Pertinent labs & imaging results that were available during my care of the patient were reviewed by me and considered in my medical decision making (see chart for details).    Final Clinical Impressions(s) / UC Diagnoses   Final diagnoses:  Vaginal candida   Discharge Instructions   None    ED Prescriptions     Medication Sig Dispense Auth. Provider   terconazole  (TERAZOL 3 ) 0.8 % vaginal cream Place 1 applicator vaginally at bedtime. 20 g Ladon Vandenberghe C, PA-C   fluconazole  (DIFLUCAN ) 200 MG tablet Take 1 tab po every 3 days 2 tablet Andra Corean BROCKS, PA-C      PDMP not reviewed this encounter.    [1]  Social History Tobacco Use   Smoking status: Never    Passive exposure: Never   Smokeless tobacco: Never  Vaping Use   Vaping status: Never Used  Substance Use Topics   Alcohol use: Yes    Comment: 1-2 drinks/months   Drug use: Never     Andra Corean BROCKS, PA-C 10/03/24 1449  "

## 2024-10-09 DIAGNOSIS — F411 Generalized anxiety disorder: Secondary | ICD-10-CM | POA: Diagnosis not present

## 2024-10-09 DIAGNOSIS — F331 Major depressive disorder, recurrent, moderate: Secondary | ICD-10-CM | POA: Diagnosis not present

## 2024-11-02 ENCOUNTER — Ambulatory Visit: Admitting: Physician Assistant

## 2024-11-14 ENCOUNTER — Other Ambulatory Visit: Payer: Self-pay | Admitting: Nurse Practitioner

## 2024-11-16 ENCOUNTER — Ambulatory Visit: Admitting: Physician Assistant

## 2024-12-05 ENCOUNTER — Ambulatory Visit: Admitting: Physician Assistant

## 2024-12-28 ENCOUNTER — Ambulatory Visit: Admitting: Pulmonary Disease

## 2025-01-16 ENCOUNTER — Ambulatory Visit: Admitting: Obstetrics and Gynecology

## 2025-02-01 ENCOUNTER — Ambulatory Visit: Admitting: Nurse Practitioner

## 2025-09-20 ENCOUNTER — Encounter: Payer: Self-pay | Admitting: Nurse Practitioner
